# Patient Record
Sex: Female | Born: 1995 | Race: Black or African American | Hispanic: No | Marital: Single | Smoking: Former smoker
Health system: Southern US, Community
[De-identification: ages and names within clinical notes are randomized; demographics above are authoritative.]

## PROBLEM LIST (undated history)

## (undated) DIAGNOSIS — F410 Panic disorder [episodic paroxysmal anxiety] without agoraphobia: Secondary | ICD-10-CM

## (undated) DIAGNOSIS — F419 Anxiety disorder, unspecified: Secondary | ICD-10-CM

---
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
## 2020-05-19 ENCOUNTER — Inpatient Hospital Stay (HOSPITAL_COMMUNITY): Payer: BC Managed Care – PPO

## 2020-05-19 ENCOUNTER — Emergency Department (HOSPITAL_COMMUNITY): Payer: BC Managed Care – PPO

## 2020-05-19 ENCOUNTER — Emergency Department (HOSPITAL_COMMUNITY): Payer: BC Managed Care – PPO | Admitting: Certified Registered Nurse Anesthetist

## 2020-05-19 ENCOUNTER — Encounter (HOSPITAL_COMMUNITY): Payer: Self-pay | Admitting: Neurology

## 2020-05-19 ENCOUNTER — Inpatient Hospital Stay (HOSPITAL_COMMUNITY)
Admission: EM | Admit: 2020-05-19 | Discharge: 2020-06-07 | DRG: 023 | Disposition: A | Discharge status: Rehabilitation Facility | Payer: BC Managed Care – PPO | Attending: Internal Medicine | Admitting: Internal Medicine

## 2020-05-19 ENCOUNTER — Encounter (HOSPITAL_COMMUNITY)
Admission: EM | Disposition: A | Discharge status: Rehabilitation Facility | Payer: Self-pay | Source: Home / Self Care | Attending: Neurology

## 2020-05-19 DIAGNOSIS — B9620 Unspecified Escherichia coli [E. coli] as the cause of diseases classified elsewhere: Secondary | ICD-10-CM | POA: Diagnosis present

## 2020-05-19 DIAGNOSIS — I63411 Cerebral infarction due to embolism of right middle cerebral artery: Secondary | ICD-10-CM | POA: Diagnosis not present

## 2020-05-19 DIAGNOSIS — F17200 Nicotine dependence, unspecified, uncomplicated: Secondary | ICD-10-CM | POA: Diagnosis not present

## 2020-05-19 DIAGNOSIS — R0602 Shortness of breath: Secondary | ICD-10-CM

## 2020-05-19 DIAGNOSIS — U071 COVID-19: Secondary | ICD-10-CM | POA: Diagnosis not present

## 2020-05-19 DIAGNOSIS — R000 Tachycardia, unspecified: Secondary | ICD-10-CM | POA: Diagnosis not present

## 2020-05-19 DIAGNOSIS — I10 Essential (primary) hypertension: Secondary | ICD-10-CM | POA: Diagnosis present

## 2020-05-19 DIAGNOSIS — F419 Anxiety disorder, unspecified: Secondary | ICD-10-CM | POA: Diagnosis present

## 2020-05-19 DIAGNOSIS — R4701 Aphasia: Secondary | ICD-10-CM | POA: Diagnosis present

## 2020-05-19 DIAGNOSIS — F410 Panic disorder [episodic paroxysmal anxiety] without agoraphobia: Secondary | ICD-10-CM | POA: Diagnosis present

## 2020-05-19 DIAGNOSIS — D696 Thrombocytopenia, unspecified: Secondary | ICD-10-CM | POA: Diagnosis not present

## 2020-05-19 DIAGNOSIS — I6389 Other cerebral infarction: Secondary | ICD-10-CM | POA: Diagnosis not present

## 2020-05-19 DIAGNOSIS — D6859 Other primary thrombophilia: Secondary | ICD-10-CM | POA: Diagnosis present

## 2020-05-19 DIAGNOSIS — E222 Syndrome of inappropriate secretion of antidiuretic hormone: Secondary | ICD-10-CM | POA: Diagnosis present

## 2020-05-19 DIAGNOSIS — G460 Middle cerebral artery syndrome: Secondary | ICD-10-CM | POA: Diagnosis present

## 2020-05-19 DIAGNOSIS — D649 Anemia, unspecified: Secondary | ICD-10-CM | POA: Diagnosis present

## 2020-05-19 DIAGNOSIS — E0781 Sick-euthyroid syndrome: Secondary | ICD-10-CM | POA: Diagnosis present

## 2020-05-19 DIAGNOSIS — I776 Arteritis, unspecified: Secondary | ICD-10-CM | POA: Diagnosis present

## 2020-05-19 DIAGNOSIS — J9690 Respiratory failure, unspecified, unspecified whether with hypoxia or hypercapnia: Secondary | ICD-10-CM

## 2020-05-19 DIAGNOSIS — I639 Cerebral infarction, unspecified: Secondary | ICD-10-CM | POA: Diagnosis present

## 2020-05-19 DIAGNOSIS — I615 Nontraumatic intracerebral hemorrhage, intraventricular: Secondary | ICD-10-CM | POA: Diagnosis not present

## 2020-05-19 DIAGNOSIS — I63311 Cerebral infarction due to thrombosis of right middle cerebral artery: Secondary | ICD-10-CM | POA: Diagnosis not present

## 2020-05-19 DIAGNOSIS — I63512 Cerebral infarction due to unspecified occlusion or stenosis of left middle cerebral artery: Secondary | ICD-10-CM | POA: Diagnosis not present

## 2020-05-19 DIAGNOSIS — R29728 NIHSS score 28: Secondary | ICD-10-CM | POA: Diagnosis not present

## 2020-05-19 DIAGNOSIS — I609 Nontraumatic subarachnoid hemorrhage, unspecified: Secondary | ICD-10-CM | POA: Diagnosis not present

## 2020-05-19 DIAGNOSIS — Z889 Allergy status to unspecified drugs, medicaments and biological substances status: Secondary | ICD-10-CM

## 2020-05-19 DIAGNOSIS — R7989 Other specified abnormal findings of blood chemistry: Secondary | ICD-10-CM | POA: Diagnosis present

## 2020-05-19 DIAGNOSIS — E871 Hypo-osmolality and hyponatremia: Secondary | ICD-10-CM | POA: Diagnosis not present

## 2020-05-19 DIAGNOSIS — R29725 NIHSS score 25: Secondary | ICD-10-CM | POA: Diagnosis not present

## 2020-05-19 DIAGNOSIS — Z6833 Body mass index (BMI) 33.0-33.9, adult: Secondary | ICD-10-CM

## 2020-05-19 DIAGNOSIS — N390 Urinary tract infection, site not specified: Secondary | ICD-10-CM | POA: Diagnosis present

## 2020-05-19 DIAGNOSIS — I82411 Acute embolism and thrombosis of right femoral vein: Secondary | ICD-10-CM | POA: Diagnosis present

## 2020-05-19 DIAGNOSIS — G8191 Hemiplegia, unspecified affecting right dominant side: Secondary | ICD-10-CM | POA: Diagnosis present

## 2020-05-19 DIAGNOSIS — Z0184 Encounter for antibody response examination: Secondary | ICD-10-CM

## 2020-05-19 DIAGNOSIS — Z789 Other specified health status: Secondary | ICD-10-CM | POA: Diagnosis not present

## 2020-05-19 DIAGNOSIS — R29730 NIHSS score 30: Secondary | ICD-10-CM | POA: Diagnosis not present

## 2020-05-19 DIAGNOSIS — I2699 Other pulmonary embolism without acute cor pulmonale: Secondary | ICD-10-CM | POA: Diagnosis not present

## 2020-05-19 DIAGNOSIS — R509 Fever, unspecified: Secondary | ICD-10-CM | POA: Diagnosis not present

## 2020-05-19 DIAGNOSIS — R569 Unspecified convulsions: Secondary | ICD-10-CM | POA: Diagnosis not present

## 2020-05-19 DIAGNOSIS — Q211 Atrial septal defect: Secondary | ICD-10-CM

## 2020-05-19 DIAGNOSIS — D72829 Elevated white blood cell count, unspecified: Secondary | ICD-10-CM

## 2020-05-19 DIAGNOSIS — R29723 NIHSS score 23: Secondary | ICD-10-CM | POA: Diagnosis not present

## 2020-05-19 DIAGNOSIS — I824Y9 Acute embolism and thrombosis of unspecified deep veins of unspecified proximal lower extremity: Secondary | ICD-10-CM | POA: Diagnosis not present

## 2020-05-19 DIAGNOSIS — Z9282 Status post administration of tPA (rtPA) in a different facility within the last 24 hours prior to admission to current facility: Secondary | ICD-10-CM | POA: Diagnosis not present

## 2020-05-19 DIAGNOSIS — I63412 Cerebral infarction due to embolism of left middle cerebral artery: Secondary | ICD-10-CM

## 2020-05-19 DIAGNOSIS — G911 Obstructive hydrocephalus: Secondary | ICD-10-CM | POA: Diagnosis not present

## 2020-05-19 DIAGNOSIS — I63232 Cerebral infarction due to unspecified occlusion or stenosis of left carotid arteries: Secondary | ICD-10-CM | POA: Diagnosis not present

## 2020-05-19 DIAGNOSIS — E876 Hypokalemia: Secondary | ICD-10-CM | POA: Diagnosis present

## 2020-05-19 DIAGNOSIS — I6522 Occlusion and stenosis of left carotid artery: Secondary | ICD-10-CM | POA: Diagnosis not present

## 2020-05-19 DIAGNOSIS — R1310 Dysphagia, unspecified: Secondary | ICD-10-CM | POA: Diagnosis present

## 2020-05-19 DIAGNOSIS — E669 Obesity, unspecified: Secondary | ICD-10-CM | POA: Diagnosis present

## 2020-05-19 DIAGNOSIS — R768 Other specified abnormal immunological findings in serum: Secondary | ICD-10-CM | POA: Diagnosis not present

## 2020-05-19 DIAGNOSIS — D62 Acute posthemorrhagic anemia: Secondary | ICD-10-CM | POA: Diagnosis not present

## 2020-05-19 DIAGNOSIS — F17210 Nicotine dependence, cigarettes, uncomplicated: Secondary | ICD-10-CM | POA: Diagnosis present

## 2020-05-19 DIAGNOSIS — R1312 Dysphagia, oropharyngeal phase: Secondary | ICD-10-CM | POA: Diagnosis not present

## 2020-05-19 HISTORY — DX: Cerebral infarction, unspecified: I63.9

## 2020-05-19 HISTORY — PX: IR US GUIDE VASC ACCESS RIGHT: IMG2390

## 2020-05-19 HISTORY — PX: RADIOLOGY WITH ANESTHESIA: SHX6223

## 2020-05-19 HISTORY — PX: IR PERCUTANEOUS ART THROMBECTOMY/INFUSION INTRACRANIAL INC DIAG ANGIO: IMG6087

## 2020-05-19 HISTORY — PX: IR CT HEAD LTD: IMG2386

## 2020-05-19 LAB — CBC WITH DIFFERENTIAL/PLATELET
Abs Immature Granulocytes: 0.02 10*3/uL (ref 0.00–0.07)
Basophils Absolute: 0 10*3/uL (ref 0.0–0.1)
Basophils Relative: 0 %
Eosinophils Absolute: 0 10*3/uL (ref 0.0–0.5)
Eosinophils Relative: 0 %
HCT: 37.9 % (ref 36.0–46.0)
Hemoglobin: 12.2 g/dL (ref 12.0–15.0)
Immature Granulocytes: 0 %
Lymphocytes Relative: 8 %
Lymphs Abs: 0.6 10*3/uL — ABNORMAL LOW (ref 0.7–4.0)
MCH: 28.4 pg (ref 26.0–34.0)
MCHC: 32.2 g/dL (ref 30.0–36.0)
MCV: 88.3 fL (ref 80.0–100.0)
Monocytes Absolute: 0.3 10*3/uL (ref 0.1–1.0)
Monocytes Relative: 4 %
Neutro Abs: 6.5 10*3/uL (ref 1.7–7.7)
Neutrophils Relative %: 88 %
Platelets: 288 10*3/uL (ref 150–400)
RBC: 4.29 MIL/uL (ref 3.87–5.11)
RDW: 12.5 % (ref 11.5–15.5)
WBC: 7.4 10*3/uL (ref 4.0–10.5)
nRBC: 0 % (ref 0.0–0.2)

## 2020-05-19 LAB — COMPREHENSIVE METABOLIC PANEL
ALT: 13 U/L (ref 0–44)
ALT: 15 U/L (ref 0–44)
AST: 17 U/L (ref 15–41)
AST: 28 U/L (ref 15–41)
Albumin: 3.1 g/dL — ABNORMAL LOW (ref 3.5–5.0)
Albumin: 2.7 g/dL — ABNORMAL LOW (ref 3.5–5.0)
Alkaline Phosphatase: 59 U/L (ref 38–126)
Alkaline Phosphatase: 55 U/L (ref 38–126)
Anion gap: 12 (ref 5–15)
Anion gap: 9 (ref 5–15)
BUN: 6 mg/dL (ref 6–20)
BUN: 5 mg/dL — ABNORMAL LOW (ref 6–20)
CO2: 18 mmol/L — ABNORMAL LOW (ref 22–32)
CO2: 21 mmol/L — ABNORMAL LOW (ref 22–32)
Calcium: 8.7 mg/dL — ABNORMAL LOW (ref 8.9–10.3)
Calcium: 8.2 mg/dL — ABNORMAL LOW (ref 8.9–10.3)
Chloride: 108 mmol/L (ref 98–111)
Chloride: 109 mmol/L (ref 98–111)
Creatinine, Ser: 0.64 mg/dL (ref 0.44–1.00)
Creatinine, Ser: 0.66 mg/dL (ref 0.44–1.00)
GFR calc Af Amer: >60 mL/min (ref >60)
GFR calc Af Amer: >60 mL/min (ref >60)
GFR calc non Af Amer: >60 mL/min (ref >60)
GFR calc non Af Amer: >60 mL/min (ref >60)
Glucose, Bld: 103 mg/dL — ABNORMAL HIGH (ref 70–99)
Glucose, Bld: 111 mg/dL — ABNORMAL HIGH (ref 70–99)
Potassium: 4 mmol/L (ref 3.5–5.1)
Potassium: 3.8 mmol/L (ref 3.5–5.1)
Sodium: 138 mmol/L (ref 135–145)
Sodium: 139 mmol/L (ref 135–145)
Total Bilirubin: 0.4 mg/dL (ref 0.3–1.2)
Total Bilirubin: 0.3 mg/dL (ref 0.3–1.2)
Total Protein: 7.5 g/dL (ref 6.5–8.1)
Total Protein: 6.8 g/dL (ref 6.5–8.1)

## 2020-05-19 LAB — POCT I-STAT 7, (LYTES, BLD GAS, ICA,H+H)
Acid-base deficit: 3 mmol/L — ABNORMAL HIGH (ref 0.0–2.0)
Bicarbonate: 21.3 mmol/L (ref 20.0–28.0)
Calcium, Ion: 1.08 mmol/L — ABNORMAL LOW (ref 1.15–1.40)
HCT: 35 % — ABNORMAL LOW (ref 36.0–46.0)
Hemoglobin: 11.9 g/dL — ABNORMAL LOW (ref 12.0–15.0)
O2 Saturation: 100 %
Patient temperature: 96.3
Potassium: 3.5 mmol/L (ref 3.5–5.1)
Sodium: 140 mmol/L (ref 135–145)
TCO2: 22 mmol/L (ref 22–32)
pCO2 arterial: 33.8 mmHg (ref 32.0–48.0)
pH, Arterial: 7.401 (ref 7.350–7.450)
pO2, Arterial: 394 mmHg — ABNORMAL HIGH (ref 83.0–108.0)

## 2020-05-19 LAB — PROTIME-INR
INR: 0.9 (ref 0.8–1.2)
Prothrombin Time: 11.8 seconds (ref 11.4–15.2)

## 2020-05-19 LAB — I-STAT BETA HCG BLOOD, ED (MC, WL, AP ONLY): I-stat hCG, quantitative: <5 m[IU]/mL (ref <5)

## 2020-05-19 LAB — CBC
HCT: 38.5 % (ref 36.0–46.0)
Hemoglobin: 12.2 g/dL (ref 12.0–15.0)
MCH: 28.7 pg (ref 26.0–34.0)
MCHC: 31.7 g/dL (ref 30.0–36.0)
MCV: 90.6 fL (ref 80.0–100.0)
Platelets: 285 10*3/uL (ref 150–400)
RBC: 4.25 MIL/uL (ref 3.87–5.11)
RDW: 12.6 % (ref 11.5–15.5)
WBC: 4.8 10*3/uL (ref 4.0–10.5)
nRBC: 0 % (ref 0.0–0.2)

## 2020-05-19 LAB — I-STAT CHEM 8, ED
BUN: 6 mg/dL (ref 6–20)
Calcium, Ion: 1.09 mmol/L — ABNORMAL LOW (ref 1.15–1.40)
Chloride: 108 mmol/L (ref 98–111)
Creatinine, Ser: 0.5 mg/dL (ref 0.44–1.00)
Glucose, Bld: 101 mg/dL — ABNORMAL HIGH (ref 70–99)
HCT: 39 % (ref 36.0–46.0)
Hemoglobin: 13.3 g/dL (ref 12.0–15.0)
Potassium: 4.3 mmol/L (ref 3.5–5.1)
Sodium: 140 mmol/L (ref 135–145)
TCO2: 22 mmol/L (ref 22–32)

## 2020-05-19 LAB — DIFFERENTIAL
Abs Immature Granulocytes: 0 10*3/uL (ref 0.00–0.07)
Basophils Absolute: 0 10*3/uL (ref 0.0–0.1)
Basophils Relative: 0 %
Eosinophils Absolute: 0.1 10*3/uL (ref 0.0–0.5)
Eosinophils Relative: 1 %
Immature Granulocytes: 0 %
Lymphocytes Relative: 45 %
Lymphs Abs: 2.1 10*3/uL (ref 0.7–4.0)
Monocytes Absolute: 0.8 10*3/uL (ref 0.1–1.0)
Monocytes Relative: 17 %
Neutro Abs: 1.8 10*3/uL (ref 1.7–7.7)
Neutrophils Relative %: 37 %

## 2020-05-19 LAB — FIBRINOGEN: Fibrinogen: 289 mg/dL (ref 210–475)

## 2020-05-19 LAB — APTT: aPTT: 27 seconds (ref 24–36)

## 2020-05-19 LAB — CBG MONITORING, ED: Glucose-Capillary: 107 mg/dL — ABNORMAL HIGH (ref 70–99)

## 2020-05-19 LAB — FERRITIN: Ferritin: 24 ng/mL (ref 11–307)

## 2020-05-19 LAB — TYPE AND SCREEN
ABO/RH(D): O POS
Antibody Screen: NEGATIVE

## 2020-05-19 LAB — D-DIMER, QUANTITATIVE: D-Dimer, Quant: >20 ug/mL-FEU — ABNORMAL HIGH (ref 0.00–0.50)

## 2020-05-19 LAB — HEPATITIS B SURFACE ANTIGEN: Hepatitis B Surface Ag: NONREACTIVE

## 2020-05-19 LAB — PROCALCITONIN: Procalcitonin: <0.1 ng/mL

## 2020-05-19 LAB — ABO/RH: ABO/RH(D): O POS

## 2020-05-19 LAB — SAR COV2 SEROLOGY (COVID19)AB(IGG),IA: SARS-CoV-2 Ab, IgG: REACTIVE — AB

## 2020-05-19 LAB — HIV ANTIBODY (ROUTINE TESTING W REFLEX): HIV Screen 4th Generation wRfx: NONREACTIVE

## 2020-05-19 LAB — LACTIC ACID, PLASMA: Lactic Acid, Venous: 1.7 mmol/L (ref 0.5–1.9)

## 2020-05-19 LAB — LACTATE DEHYDROGENASE: LDH: 152 U/L (ref 98–192)

## 2020-05-19 LAB — C-REACTIVE PROTEIN: CRP: 0.8 mg/dL (ref <1.0)

## 2020-05-19 LAB — TRIGLYCERIDES: Triglycerides: 43 mg/dL (ref <150)

## 2020-05-19 LAB — SARS CORONAVIRUS 2 BY RT PCR (HOSPITAL ORDER, PERFORMED IN ~~LOC~~ HOSPITAL LAB): SARS Coronavirus 2: POSITIVE — AB

## 2020-05-19 SURGERY — IR WITH ANESTHESIA
Anesthesia: General

## 2020-05-19 MED ORDER — TRANEXAMIC ACID-NACL 1000-0.7 MG/100ML-% IV SOLN
1000.0000 mg | INTRAVENOUS | Status: AC
  Administered 2020-05-19: 1000 mg via INTRAVENOUS
  Filled 2020-05-19: qty 100

## 2020-05-19 MED ORDER — ROCURONIUM BROMIDE 100 MG/10ML IV SOLN
INTRAVENOUS | Status: DC | PRN
  Administered 2020-05-19: 50 mg via INTRAVENOUS
  Administered 2020-05-19: 80 mg via INTRAVENOUS
  Administered 2020-05-19: 20 mg via INTRAVENOUS

## 2020-05-19 MED ORDER — DOCUSATE SODIUM 50 MG/5ML PO LIQD: 100.0000 mg | Freq: Two times a day (BID) | ORAL | Status: DC

## 2020-05-19 MED ORDER — CLEVIDIPINE BUTYRATE 0.5 MG/ML IV EMUL
0.0000 mg/h | INTRAVENOUS | Status: DC
  Filled 2020-05-19: qty 150
  Filled 2020-05-19: qty 50

## 2020-05-19 MED ORDER — IOHEXOL 300 MG/ML  SOLN
150.0000 mL | Freq: Once | INTRAMUSCULAR | Status: AC | PRN
  Administered 2020-05-19: 50 mL via INTRA_ARTERIAL

## 2020-05-19 MED ORDER — THIAMINE HCL 100 MG/ML IJ SOLN
100.0000 mg | Freq: Every day | INTRAMUSCULAR | Status: DC
  Administered 2020-05-20 – 2020-05-21 (×2): 100 mg via INTRAVENOUS
  Filled 2020-05-19 (×2): qty 2

## 2020-05-19 MED ORDER — PROPOFOL 1000 MG/100ML IV EMUL
5.0000 ug/kg/min | INTRAVENOUS | Status: DC
  Administered 2020-05-19: 40 ug/kg/min via INTRAVENOUS
  Filled 2020-05-19: qty 200
  Filled 2020-05-19: qty 100

## 2020-05-19 MED ORDER — SODIUM CHLORIDE 0.9 % IV SOLN: Freq: Once | INTRAVENOUS | Status: DC

## 2020-05-19 MED ORDER — PROPOFOL 1000 MG/100ML IV EMUL: 0.0000 ug/kg/min | INTRAVENOUS | Status: DC

## 2020-05-19 MED ORDER — GLYCOPYRROLATE 0.2 MG/ML IJ SOLN
INTRAMUSCULAR | Status: DC | PRN
  Administered 2020-05-19: .2 mg via INTRAVENOUS

## 2020-05-19 MED ORDER — NICARDIPINE HCL IN NACL 20-0.86 MG/200ML-% IV SOLN: 0.0000 mg/h | INTRAVENOUS | Status: DC | PRN

## 2020-05-19 MED ORDER — PROPOFOL 500 MG/50ML IV EMUL
INTRAVENOUS | Status: DC | PRN
  Administered 2020-05-19: 50 ug/kg/min via INTRAVENOUS

## 2020-05-19 MED ORDER — ADULT MULTIVITAMIN W/MINERALS CH: 1.0000 | ORAL_TABLET | Freq: Every day | ORAL | Status: DC

## 2020-05-19 MED ORDER — MIDAZOLAM HCL 2 MG/2ML IJ SOLN: 2.0000 mg | INTRAMUSCULAR | Status: DC | PRN

## 2020-05-19 MED ORDER — POLYETHYLENE GLYCOL 3350 17 G PO PACK: 17.0000 g | PACK | Freq: Every day | ORAL | Status: DC

## 2020-05-19 MED ORDER — SODIUM CHLORIDE 0.9% FLUSH
3.0000 mL | Freq: Two times a day (BID) | INTRAVENOUS | Status: DC
  Administered 2020-05-19 – 2020-05-23 (×6): 3 mL via INTRAVENOUS

## 2020-05-19 MED ORDER — FENTANYL CITRATE (PF) 100 MCG/2ML IJ SOLN: 50.0000 ug | INTRAMUSCULAR | Status: DC | PRN

## 2020-05-19 MED ORDER — PHENYLEPHRINE HCL (PRESSORS) 10 MG/ML IV SOLN
INTRAVENOUS | Status: DC | PRN
  Administered 2020-05-19 (×4): 80 ug via INTRAVENOUS

## 2020-05-19 MED ORDER — DEXAMETHASONE SODIUM PHOSPHATE 10 MG/ML IJ SOLN
INTRAMUSCULAR | Status: DC | PRN
  Administered 2020-05-19: 10 mg via INTRAVENOUS

## 2020-05-19 MED ORDER — NITROGLYCERIN 1 MG/10 ML FOR IR/CATH LAB
INTRA_ARTERIAL | Status: AC
  Filled 2020-05-19: qty 10

## 2020-05-19 MED ORDER — SODIUM CHLORIDE 0.9% FLUSH: 3.0000 mL | Freq: Once | INTRAVENOUS | Status: DC

## 2020-05-19 MED ORDER — IOHEXOL 300 MG/ML  SOLN
150.0000 mL | Freq: Once | INTRAMUSCULAR | Status: AC | PRN
  Administered 2020-05-19: 75 mL via INTRA_ARTERIAL

## 2020-05-19 MED ORDER — FENTANYL CITRATE (PF) 100 MCG/2ML IJ SOLN
INTRAMUSCULAR | Status: AC
Start: 2020-05-19 — End: 2020-05-20
  Filled 2020-05-19: qty 2

## 2020-05-19 MED ORDER — ASPIRIN 325 MG PO TABS
ORAL_TABLET | ORAL | Status: AC
  Filled 2020-05-19: qty 1

## 2020-05-19 MED ORDER — ACETAMINOPHEN 160 MG/5ML PO SOLN
650.0000 mg | ORAL | Status: DC | PRN
  Administered 2020-05-23 – 2020-05-29 (×7): 650 mg
  Filled 2020-05-19 (×8): qty 20.3

## 2020-05-19 MED ORDER — FENTANYL CITRATE (PF) 100 MCG/2ML IJ SOLN
50.0000 ug | INTRAMUSCULAR | Status: DC | PRN
  Administered 2020-05-19 – 2020-05-20 (×2): 100 ug via INTRAVENOUS
  Filled 2020-05-19 (×2): qty 2

## 2020-05-19 MED ORDER — POLYETHYLENE GLYCOL 3350 17 G PO PACK
17.0000 g | PACK | Freq: Every day | ORAL | Status: DC
  Administered 2020-05-24 – 2020-05-25 (×2): 17 g
  Filled 2020-05-19 (×2): qty 1

## 2020-05-19 MED ORDER — FOLIC ACID 5 MG/ML IJ SOLN
1.0000 mg | Freq: Every day | INTRAMUSCULAR | Status: DC
  Administered 2020-05-20 – 2020-05-21 (×2): 1 mg via INTRAVENOUS
  Filled 2020-05-19 (×4): qty 0.2

## 2020-05-19 MED ORDER — FENTANYL CITRATE (PF) 100 MCG/2ML IJ SOLN
INTRAMUSCULAR | Status: DC | PRN
Start: 2020-05-19 — End: 2020-05-19
  Administered 2020-05-19 (×2): 50 ug via INTRAVENOUS

## 2020-05-19 MED ORDER — SUGAMMADEX SODIUM 200 MG/2ML IV SOLN
4.0000 mg/kg | INTRAVENOUS | Status: DC
  Filled 2020-05-19: qty 4

## 2020-05-19 MED ORDER — ADULT MULTIVITAMIN LIQUID CH
15.0000 mL | Freq: Every day | ORAL | Status: DC
  Administered 2020-05-19 – 2020-05-20 (×2): 15 mL
  Filled 2020-05-19 (×4): qty 15

## 2020-05-19 MED ORDER — NIMODIPINE 6 MG/ML PO SOLN
60.0000 mg | ORAL | Status: DC
  Administered 2020-05-19 – 2020-05-20 (×5): 60 mg
  Filled 2020-05-19 (×5): qty 10

## 2020-05-19 MED ORDER — CLOPIDOGREL BISULFATE 300 MG PO TABS
ORAL_TABLET | ORAL | Status: AC
  Filled 2020-05-19: qty 1

## 2020-05-19 MED ORDER — NIMODIPINE 30 MG PO CAPS
60.0000 mg | ORAL_CAPSULE | ORAL | Status: DC
  Filled 2020-05-19: qty 2

## 2020-05-19 MED ORDER — CLOPIDOGREL BISULFATE 75 MG PO TABS
ORAL_TABLET | ORAL | Status: DC | PRN
  Administered 2020-05-19: 300 mg

## 2020-05-19 MED ORDER — LABETALOL HCL 5 MG/ML IV SOLN: 10.0000 mg | Freq: Once | INTRAVENOUS | Status: DC | PRN

## 2020-05-19 MED ORDER — DOCUSATE SODIUM 50 MG/5ML PO LIQD
100.0000 mg | Freq: Two times a day (BID) | ORAL | Status: DC
  Administered 2020-05-19 – 2020-05-25 (×5): 100 mg
  Filled 2020-05-19 (×4): qty 10

## 2020-05-19 MED ORDER — IOHEXOL 300 MG/ML  SOLN
50.0000 mL | Freq: Once | INTRAMUSCULAR | Status: AC | PRN
  Administered 2020-05-19: 25 mL via INTRA_ARTERIAL

## 2020-05-19 MED ORDER — SODIUM CHLORIDE (PF) 0.9 % IJ SOLN
INTRAVENOUS | Status: DC | PRN
  Administered 2020-05-19: 200 ug via INTRA_ARTERIAL

## 2020-05-19 MED ORDER — ONDANSETRON HCL 4 MG/2ML IJ SOLN
INTRAMUSCULAR | Status: DC | PRN
  Administered 2020-05-19: 4 mg via INTRAVENOUS

## 2020-05-19 MED ORDER — ACETAMINOPHEN 650 MG RE SUPP
650.0000 mg | RECTAL | Status: DC | PRN
  Administered 2020-05-29: 650 mg via RECTAL
  Filled 2020-05-19: qty 1

## 2020-05-19 MED ORDER — PHENYLEPHRINE HCL-NACL 10-0.9 MG/250ML-% IV SOLN
INTRAVENOUS | Status: DC | PRN
  Administered 2020-05-19: 40 ug/min via INTRAVENOUS

## 2020-05-19 MED ORDER — SODIUM CHLORIDE 0.9 % IV SOLN
50.0000 mL/h | INTRAVENOUS | Status: DC
  Administered 2020-05-23 – 2020-05-28 (×4): 50 mL/h via INTRAVENOUS

## 2020-05-19 MED ORDER — PANTOPRAZOLE SODIUM 40 MG IV SOLR
40.0000 mg | Freq: Every day | INTRAVENOUS | Status: DC
  Administered 2020-05-19 – 2020-05-21 (×4): 40 mg via INTRAVENOUS
  Filled 2020-05-19 (×4): qty 40

## 2020-05-19 MED ORDER — ACETAMINOPHEN 325 MG PO TABS
650.0000 mg | ORAL_TABLET | ORAL | Status: DC | PRN
  Administered 2020-05-20 – 2020-06-01 (×32): 650 mg via ORAL
  Filled 2020-05-19 (×28): qty 2

## 2020-05-19 MED ORDER — IOHEXOL 350 MG/ML SOLN
75.0000 mL | Freq: Once | INTRAVENOUS | Status: AC | PRN
  Administered 2020-05-19: 12:00:00 75 mL via INTRAVENOUS

## 2020-05-19 MED ORDER — ORAL CARE MOUTH RINSE
15.0000 mL | OROMUCOSAL | Status: DC
  Administered 2020-05-20 (×6): 15 mL via OROMUCOSAL

## 2020-05-19 MED ORDER — ASPIRIN 325 MG PO TABS
ORAL_TABLET | ORAL | Status: DC | PRN
  Administered 2020-05-19: 650 mg

## 2020-05-19 MED ORDER — ALTEPLASE (STROKE) FULL DOSE INFUSION
75.8000 mg | Freq: Once | INTRAVENOUS | Status: AC
  Administered 2020-05-19: 75.8 mg via INTRAVENOUS
  Filled 2020-05-19: qty 100

## 2020-05-19 MED ORDER — SUCCINYLCHOLINE CHLORIDE 20 MG/ML IJ SOLN
INTRAMUSCULAR | Status: DC | PRN
  Administered 2020-05-19: 160 mg via INTRAVENOUS

## 2020-05-19 MED ORDER — SODIUM CHLORIDE 0.9 % IV SOLN
50.0000 mL | Freq: Once | INTRAVENOUS | Status: AC
  Administered 2020-05-19: 50 mL via INTRAVENOUS

## 2020-05-19 MED ORDER — SODIUM CHLORIDE 0.9 % IV SOLN: INTRAVENOUS | Status: DC | PRN

## 2020-05-19 MED ORDER — STROKE: EARLY STAGES OF RECOVERY BOOK
Freq: Once | Status: AC
  Filled 2020-05-19 (×2): qty 1

## 2020-05-19 MED ORDER — PROPOFOL 10 MG/ML IV BOLUS
INTRAVENOUS | Status: DC | PRN
  Administered 2020-05-19: 150 mg via INTRAVENOUS

## 2020-05-19 MED ORDER — CHLORHEXIDINE GLUCONATE 0.12% ORAL RINSE (MEDLINE KIT)
15.0000 mL | Freq: Two times a day (BID) | OROMUCOSAL | Status: DC
  Administered 2020-05-19 – 2020-05-21 (×4): 15 mL via OROMUCOSAL

## 2020-05-19 NOTE — H&P (Addendum)
NAME:  Gail Montgomery, MRN:  [id number], DOB:  [birth year], LOS: 0 ADMISSION DATE:  05/19/2020, CONSULTATION DATE:  05/19/2020 REFERRING MD:  Jobe Marker, CHIEF COMPLAINT:  Code Stroke/ Vent management   Brief History   24 year old female without significant PMH on BCP, some recreational mariajuana use. She was normal upon waking the morning of  8/21.  At 09:30 she had sudden onset of right hemiparesis, and aphasia. She presented 8/21/2021to the [facility] ED as a Code Stroke  with acute left MCA syndrome. She received tPA at 11:29 am she was deemed a  candidate for thrombectomy, and was taken to IR. PCCM  have been asked to manage vent and assist with care.  History of present illness   24 year old female normal upon waking up this morning 8/21 2021. At 09:30 she had sudden onset of right hemiparesis, and aphasia. She presents 8/21/2021to the [facility] ED as a Code Stroke  with acute left MCA syndrome.She was screened as Covid +, but was asymptomatic.  She received tPA at 11:29 am , she was deemed a  candidate for thrombectomy, and was taken to IR..In IR she had successful  opening of her LMCA with reperfusion, but LACA has a residual small distal anterior cerebral clot.  During procedure there was  ICA vasospasm vs dissection. She was loaded with ASA and Plavix. There was concern that she would need carotid  stenting . However upon re-imaging the vessel was open, and she did not require stent. There is concern for a large  subarachnoid bleed. She is currently going for CT scan of the head to further evaluate for subarachnoid hemorrhage and potential need for reversal of tPA.  PCCM have been asked to manage Vent and assist with care.   Past Medical History   Past Medical History:  Diagnosis Date  . Anxiety   . Panic attack     Significant Hospital Events   05/19/2020 Admission to [facility] with acute L MCA syndrome  Consults:  8/21 PCCM  Procedures:  8/21: Mechanical Thrombectomy   Significant Diagnostic  Tests:  05/19/2020 CTA Brain Cut off at the left ICA terminus with visible luminal clot. There is continuation of non opacification throughout the left M1 segment which is clot by CT. Non opacification at the left A1 origin. No contralateral branch occlusion is seen. Intravascular enhancement seen in the left operculum branches on the delayed phase.  Micro Data:  05/19/2020  SARS Coronavirus 2 NEGATIVE POSITIVEAbnormal   05/19/2020 >>> Blood Cultures x 2   Antimicrobials:  None   Interim history/subjective:  Pending ICU admission from IR post thrombectomy for LICA to MCA embolic stroke.  Sub arachnoid bleed post procedure Cryoprecipitate to reverse tPA Possible IVC drain insertion  Objective   Blood pressure (!) 109/93, pulse 85, resp. rate 15, weight 84.2 kg, SpO2 96 %.       No intake or output data in the 24 hours ending 05/19/20 1413 Filed Weights   05/19/20 1100  Weight: 84.2 kg    Examination: General: sedated and intubated female, on MV HENT: NCAT, ETT secure and intact, OG tube secure and intact, MM pink and moist Lungs: Bilateral chest excursion, Clear throughout Cardiovascular: S1, S2, RRR No RMG Abdomen: Soft, NT, ND, BS +Body mass index is 31.86 kg/m. 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  tPA Dose 11:29 am Load 7.6 mg over 1 minute 68.2 mg for a total dose of 75.8 mg over 1 hour Plavix and ASA load Plan Monitor for bleeding Trend CBC Transfuse for HGB < 7  Rest Per Neuro   Subarachnoid Hemorrhage Plan Cryoprecipitate and TXA in process Monitor for seizures Consider Anti seizure medication as prophylaxis Trend mag and replete for goal of > 2 Consideration of IVC drain placement  Consider consult to neuro  surgery  Blood Pressure Control post thrombectomy Plan Goal BP 140-160 systolic Cleviprex gtt to maintain BP within range Labetalol prn  Mechanical Ventilation Post LICA to MCA thrombectomy Plan CXR now stat for ETT placement and Covid trending Titrate oxygen for sats > 94% ABG prn CXR prn No SBT x 24 hours Sedation with propofol as ordered Monitor triglycerides    Covid 19 Positive Incidental finding on admission BMI 31.86 ( 62 inches, 84.2 KG) Plan Consider Monoclonal antibody infusion Trend inflammatory markers  Initiate Covid Focused order set Airborne and Contact isolation  Bevelyn Ngo, MSN, AGACNP-BC [place] [facility] See Amion for personal pager PCCM on call pager [phone]    Best practice:  Diet: NPO Pain/Anxiety/Delirium protocol (if indicated): Propofol VAP protocol (if indicated): Ordered DVT prophylaxis: None for now, received TPA plavix and ASA GI prophylaxis: Protonix Glucose control: CBG and SSI Mobility: BR Code Status: Full Family Communication: Updated by Dr. Loreta Ave Disposition: ICU  Labs   CBC: Recent Labs  Lab 05/19/20 1120 05/19/20 1122  WBC 4.8  --   NEUTROABS 1.8  --   HGB 12.2 13.3  HCT 38.5 39.0  MCV 90.6  --   PLT 285  --     Basic Metabolic Panel: Recent Labs  Lab 05/19/20 1120 05/19/20 1122  NA 138 140  K 4.0 4.3  CL 108 108  CO2 18*  --   GLUCOSE 103* 101*  BUN 6 6  CREATININE 0.64 0.50  CALCIUM 8.7*  --    GFR: CrCl cannot be calculated (Unknown ideal weight.). Recent Labs  Lab 05/19/20 1120  WBC 4.8    Liver Function Tests: Recent Labs  Lab 05/19/20 1120  AST 17  ALT 13  ALKPHOS 59  BILITOT 0.4  PROT 7.5  ALBUMIN 3.1*   No results for input(s): LIPASE, AMYLASE in the last 168 hours. No results for input(s): AMMONIA in the last 168 hours.  ABG    Component Value Date/Time   TCO2 22 05/19/2020 1122     Coagulation Profile: Recent Labs  Lab  05/19/20 1120  INR 0.9    Cardiac Enzymes: No results for input(s): CKTOTAL, CKMB, CKMBINDEX, TROPONINI in the last 168 hours.  HbA1C: No results found for: HGBA1C  CBG: Recent Labs  Lab 05/19/20 1116  GLUCAP 107*    Review of Systems:   Unable as patient   Past Medical History  She,  has a past medical history of Anxiety and Panic attack.   Surgical History   No past surgical history on file.   Social History   reports that she has been smoking cigarettes. She has never used smokeless tobacco. She reports current alcohol use. She reports that she does not use drugs.   Family History   Her family history is not on file.   Allergies No Known Allergies   Home Medications  Prior to Admission medications   Not on File  Critical care time: 27 minutes    Bevelyn Ngo, MSN, AGACNP-BC [facility] Pulmonary/Critical Care Medicine See Amion for personal pager PCCM on call pager [phone] 05/19/2020 5:01 PM

## 2020-05-19 NOTE — Progress Notes (Signed)
Cryo finished. Propofol turned off to facilitate neuro exam.

## 2020-05-19 NOTE — Progress Notes (Signed)
Pharmacist Code Stroke Response  Notified to mix tPA at 1129 by Dr. Amada Jupiter tPA was reconstituted at 1132 but unknown weight. After weight was obtained, drug was ready to administer at 1138.  tPA dose = 7.6mg  bolus over 1 minute followed by 68.2mg  for a total dose of 75.8mg  over 1 hour  Issues/delays encountered (if applicable): Unable to get initial weight due to lack of a scale bed.   Destynee Stringfellow, Drake Leach 05/19/20 11:42 AM

## 2020-05-19 NOTE — Sedation Documentation (Addendum)
Pt COVID positive. AC working on bed assignment.

## 2020-05-19 NOTE — Anesthesia Preprocedure Evaluation (Addendum)
Anesthesia Evaluation  Patient identified by MRN, date of birth, ID band Patient awake    Reviewed: Allergy & Precautions, H&P , NPO status , Patient's Chart, lab work & pertinent test results  Airway Mallampati: III  TM Distance: >3 FB Neck ROM: Full    Dental no notable dental hx. (+) Teeth Intact, Dental Advisory Given   Pulmonary neg pulmonary ROS,    Pulmonary exam normal breath sounds clear to auscultation       Cardiovascular negative cardio ROS   Rhythm:Regular Rate:Normal     Neuro/Psych Anxiety Aphasia R side weakness CVA, Residual Symptoms    GI/Hepatic negative GI ROS, Neg liver ROS,   Endo/Other  negative endocrine ROS  Renal/GU negative Renal ROS  negative genitourinary   Musculoskeletal   Abdominal   Peds  Hematology negative hematology ROS (+)   Anesthesia Other Findings   Reproductive/Obstetrics negative OB ROS                            Anesthesia Physical Anesthesia Plan  ASA: III and emergent  Anesthesia Plan: General   Post-op Pain Management:    Induction: Intravenous, Rapid sequence and Cricoid pressure planned  PONV Risk Score and Plan: 4 or greater and Ondansetron, Dexamethasone and Treatment may vary due to age or medical condition  Airway Management Planned: Oral ETT  Additional Equipment:   Intra-op Plan:   Post-operative Plan: Extubation in OR and Possible Post-op intubation/ventilation  Informed Consent: I have reviewed the patients History and Physical, chart, labs and discussed the procedure including the risks, benefits and alternatives for the proposed anesthesia with the patient or authorized representative who has indicated his/her understanding and acceptance.     Dental advisory given  Plan Discussed with: CRNA  Anesthesia Plan Comments:         Anesthesia Quick Evaluation

## 2020-05-19 NOTE — Transfer of Care (Signed)
Immediate Anesthesia Transfer of Care Note  Patient: Gail Montgomery  Procedure(s) Performed: IR WITH ANESTHESIA (N/A )  Patient Location: ICU  Anesthesia Type:General  Level of Consciousness: sedated, unresponsive and Patient remains intubated per anesthesia plan  Airway & Oxygen Therapy: Patient remains intubated per anesthesia plan and Patient placed on Ventilator (see vital sign flow sheet for setting)  Post-op Assessment: Report given to RN and Post -op Vital signs reviewed and stable  Post vital signs: Reviewed and stable  Last Vitals:  Vitals Value Taken Time  BP 130/75 05/19/20 1615  Temp    Pulse 73 05/19/20 1623  Resp 15 05/19/20 1623  SpO2 100 % 05/19/20 1623  Vitals shown include unvalidated device data.  Last Pain: There were no vitals filed for this visit.       Complications: No complications documented.

## 2020-05-19 NOTE — Sedation Documentation (Signed)
Spoke with Ashok Cordia, RN, 4N Change. Pt to be admitted to 4N18. Requested monitor and O2 tank be placed on bed. Transport contact to bring bed to IR2.

## 2020-05-19 NOTE — Sedation Documentation (Signed)
Spoke with Margo in pt placement. Pt to be admitted to 4N. Room being cleaned.

## 2020-05-19 NOTE — H&P (Signed)
Neurology H&P  CC: Right-sided weakness and aphasia  History is obtained from: Patient's boyfriend, mother  HPI: Gail Montgomery is a 24 y.o. female with no significant past medical history other than hormonal birth control who presents with right-sided weakness and aphasia that started abruptly at 9:30 AM.  She was in her normal state of health and sitting on the edge of the bed when she suddenly developed right-sided weakness.  Her boyfriend called 911 and she was activated as a code stroke in route.  She was taken emergently for a CT which showed a dense left MCA sign and therefore IR was activated.  A CTA confirmed distal left ICA occlusion extending in the MCA and ACA.  She was taken for emergent thrombectomy and the MCA was opened relatively easily, grew ACA clot migrated distally, was more difficult to reach.  Intraoperatively, there was noticed that she had severe stenosis of her carotid artery, initially felt to represent possible dissection.  It was felt that stenting was going to be necessary and therefore she was loaded with aspirin and Plavix.  On the run prior to applying the stent, the vessel was found to be widely patent and therefore no stent was applied.  Postoperatively, she was taken for head CT which demonstrated a large amount of subarachnoid hemorrhage with extension into the ventricles including lateral ventricle and fourth ventricle.   LKW: 9:30 AM tpa given?:  Yes IR Thrombectomy?  Yes Modified Rankin Scale: 0-Completely asymptomatic and back to baseline post- stroke    ROS: Unable to obtain due to altered mental status. Past Medical History:  Diagnosis Date  . Anxiety   . Panic attack     Family history: Unable to obtain due to altered mental status.    Social History: Per her boyfriend, she uses marijuana occasionally, no synthetic marijuana, no other drugs.  Prior to Admission medications     birth control   Exam: Current vital signs: BP (!) 109/93    Pulse 85   Resp 15   Wt 84.2 kg   SpO2 96%   BMI 33.95 kg/m    Physical Exam  Constitutional: Appears well-developed and well-nourished.  Psych: Affect appropriate to situation Eyes: No scleral injection HENT: No OP obstrucion Head: Normocephalic.  Cardiovascular: Normal rate and regular rhythm.  Respiratory: Effort normal and breath sounds normal to anterior ascultation GI: Soft.  No distension. There is no tenderness.  Skin: WDI  Neuro: Mental Status: Patient is awake, alert, globally aphasic Cranial Nerves: II: Does not blink to threat from the right pupils are equal, round, and reactive to light.   III,IV, VI: EOMI without ptosis or diploplia.  VII: Facial movement is diminished on the right Motor: She has severe right hemiparesis with little movement of the right arm or leg, though I do see her movement briefly one time.  She is able to move her left side voluntarily well. sensory: Does not respond as much on the right as the left Cerebellar: She does not perform   I have reviewed labs in epic and the pertinent results are: Covid positive CMP-unremarkable  I have reviewed the images obtained: CT/CTA-left distal ICA/MCA occlusion  Primary Diagnosis:  Cerebral infarction due to embolism of  left middle cerebral artery.   Secondary Diagnosis: Covid   Impression: 24 year old female with ICA/MCA occlusion in the setting of Covid despite having had her Pfizer vaccine with most recent dose 1 month ago.  Other etiologies will need to be evaluated as well including  cardiac emboli.  Her postoperative hemorrhage represents a distinct dilemma.  Between the time she was scanned intraoperatively in the time she was scanned on the plain CT, there is distinct progression of the hemorrhage.  In that setting, I opted to give TXA and cryoprecipitate to try and stops the bleeding.  This does put her at risk of other clots, but not treating runs a risk of severe continued hemorrhage  as well.  Plan: Stroke - HgbA1c, fasting lipid panel - Frequent neuro checks - Echocardiogram - Prophylactic therapy-None, will place NG tube to suction given recent load - Risk factor modification - Telemetry monitoring  SAH - TXA and cryo given to counteract tPA.  - Gastric lavage to remove as much antiplatelet if any remains in stomach - NSGY consult  - repeat head CT at 9pm.   Positive Covid test - no clear symptoms prior to stroke - appreciate CCM assistance   This patient is critically ill and at significant risk of neurological worsening, death and care requires constant monitoring of vital signs, hemodynamics,respiratory and cardiac monitoring, neurological assessment, discussion with family, other specialists and medical decision making of high complexity. I spent 95 minutes of neurocritical care time  in the care of  this patient. This was time spent independent of any time provided by nurse practitioner or PA.  Ritta Slot, MD [facility] [phone]  If 7pm- 7am, please page neurology on call as listed in AMION.

## 2020-05-19 NOTE — Progress Notes (Signed)
Patient ID: Gail Montgomery, female   DOB: 16-Dec-1995, 24 y.o.   MRN: [id number] BP 134/86   Pulse 77   Resp 15   Ht 5\' 4"  (1.626 m)   Wt 84.2 kg   SpO2 100%   BMI 31.86 kg/m   Not following commands, or at least not consistently following commands Eyes open, conjugate gaze. Reactive to light Vigorous cough Symmetric facial movements No movement on right side to noxious stimuli Purposeful movement left upper and lower extremities. Pulling at the ET tube with left hand Given antiplatelet treatment earlier, ventricular catheter is ill advised at this time.  Available for questions as they arise.

## 2020-05-19 NOTE — Procedures (Signed)
Neuro-Interventional Radiology  Post Cerebral Angiogram Procedure Note  Operator:    Dr. Loreta Ave Assistant:   None  History:   24 yo female presents with acute left MCA syndrome, candidate for thrombectomy.   Baseline mRS:  0 NIHSS:   23 Site of occlusion:  Left MCA & left ACA   Procedure: US guided right CFA access Cervical & Cerebral Angiogram Mechanical Thrombectomy Deployment of Angioseal Flat Panel CT in NIR  Left MCA:  Baseline TICI 0 First Pass Device:  Local aspiration and solitaire 4 x 40  Result   TICI 0  Second Pass Device: Local aspiration and solitaire 4 x 40  Result   TICI 3  Left ACA/pericallosal Baseline TICI 0 Final TICI 0 pericallosal artery  Findings:   Patent right CFA Left M1 occlusion, final is TICI 3 Left ACA A2 occlusion migrated to pericallosal artery during the case.  Could not complete a first pass safely.   Significant irregularity of the cervical ICA, concerning for dissection, that resolved after observation, and once DAPT was initiated.  The final appearance may represent spasm vs FMD/vasculitis  Anesthesia:   GETA  EBL:    150cc     Complication:  None   Medication: IV tPA administered?: yes IA Medication:  nitro OG:     300mg  plavix, 650mg  ASA  Recommendations: - right hip straight overnight - Goal SBP 140-160.   - Frequent NV checks - CT now, discretion of Neurology - NIR to follow  Signed,  . , DO

## 2020-05-19 NOTE — ED Provider Notes (Signed)
[facility]GICAL [facility] Provider Note   CSN: [id number] Arrival date & time: 05/19/20  1112     History No chief complaint on file.   Gail Montgomery is a 24 y.o. female.  Patient is a 24 year old female who who is brought in as a code stroke.  At around 9 AM this morning she developed sudden aphasia with right-sided weakness.  EMS activated a code stroke prior to arrival.  I saw her briefly at the bridge.  She had an intact airway.  She was not able to communicate.  History is limited due to this.        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   OB History   No obstetric history on file.     No family history on file.  Social History   Tobacco Use   Smoking status: Current Some Day Smoker    Types: Cigarettes   Smokeless tobacco: Never Used  Substance Use Topics   Alcohol use: Yes    Comment: OCCASIONAL    Drug use: No    Home Medications Prior to Admission medications   Not on File    Allergies    Patient has no known allergies.  Review of Systems   Review of Systems  Unable to perform ROS: Mental status change    Physical Exam Updated Vital Signs BP (!) 109/93    Pulse 85    Resp 15    Wt 84.2 kg    SpO2 96%    BMI 33.95 kg/m   Physical Exam HENT:     Head: Normocephalic and atraumatic.     Nose: Nose normal.     Mouth/Throat:     Mouth: Mucous membranes are moist.  Eyes:     Extraocular Movements: Extraocular movements intact.     Conjunctiva/sclera: Conjunctivae normal.  Cardiovascular:     Rate and Rhythm: Normal rate.  Pulmonary:     Effort: Pulmonary effort is normal.  Musculoskeletal:        General: Normal range of motion.  Skin:    General: Skin is warm and dry.  Neurological:     Mental Status: She is alert.     Comments: Patient with aphasia and  right-sided weakness     ED Results / Procedures / Treatments   Labs (all labs ordered are listed, but only abnormal results are displayed) Labs Reviewed  SARS CORONAVIRUS 2 BY RT PCR (HOSPITAL ORDER, PERFORMED IN [facility]) - Abnormal; Notable for the following components:      Result Value   SARS Coronavirus 2 POSITIVE (*)    All other components within normal limits  COMPREHENSIVE METABOLIC PANEL - Abnormal; Notable for the following components:   CO2 18 (*)    Glucose, Bld 103 (*)    Calcium 8.7 (*)    Albumin 3.1 (*)    All other components within normal limits  I-STAT CHEM 8, ED - Abnormal; Notable for the following components:   Glucose, Bld 101 (*)    Calcium, Ion 1.09 (*)    All other components within normal limits  CBG MONITORING, ED - Abnormal; Notable for the following components:   Glucose-Capillary 107 (*)    All other components within normal limits  CULTURE, BLOOD (ROUTINE X 2)  CULTURE, BLOOD (ROUTINE X 2)  PROTIME-INR  APTT  CBC  DIFFERENTIAL  LACTIC ACID, PLASMA  LACTIC ACID, PLASMA  CBC WITH DIFFERENTIAL/PLATELET  COMPREHENSIVE METABOLIC PANEL  D-DIMER, QUANTITATIVE (NOT AT [facility])  PROCALCITONIN  LACTATE DEHYDROGENASE  FERRITIN  TRIGLYCERIDES  FIBRINOGEN  C-REACTIVE PROTEIN  HIV ANTIBODY (ROUTINE TESTING W REFLEX)  FIBRINOGEN  I-STAT BETA HCG BLOOD, ED ([facility], [facility], [facility] ONLY)  I-STAT BETA HCG BLOOD, ED ([facility], [facility], [facility] ONLY)  TYPE AND SCREEN  ABO/RH  PREPARE CRYOPRECIPITATE    EKG None  Radiology CT Code Stroke CTA Head W/WO contrast  Result Date: 05/19/2020 CLINICAL DATA:  Stroke suspected. EXAM: CT ANGIOGRAPHY HEAD AND NECK TECHNIQUE: Multidetector CT imaging of the head and neck was performed using the standard protocol during bolus administration of intravenous contrast. Multiplanar CT image reconstructions and MIPs were obtained to evaluate the vascular anatomy. Carotid stenosis measurements (when applicable) are obtained utilizing  NASCET criteria, using the distal internal carotid diameter as the denominator. CONTRAST:  Dose is currently not known COMPARISON:  Head CT reported separately FINDINGS: CTA NECK FINDINGS Aortic arch: Negative.  Three vessel branching Right carotid system: No atheromatous changes, dissection, or beading. Left carotid system: Mild motion artifact which could account for the mildly smudged appearance of the left ICA proximally. Vertebral arteries: No proximal subclavian stenosis. Codominant vertebral arteries that are smooth and widely patent to the dura. Skeleton: No acute or aggressive finding Other neck: Negative Upper chest: Negative Review of the MIP images confirms the above findings CTA HEAD FINDINGS Anterior circulation: Cut off at the left ICA terminus with visible luminal clot. There is continuation of non opacification throughout the left M1 segment which is clot by CT. Non opacification at the left A1 origin. No contralateral branch occlusion is seen. Intravascular enhancement seen in the left operculum branches on the delayed phase. Posterior circulation: The vertebral and basilar arteries are smooth and widely patent. No branch occlusion, beading, or aneurysm. Venous sinuses: Patent Anatomic variants: None significant Review of the MIP images confirms the above findings These results were called by telephone at the time of interpretation on 05/19/2020 at 11:35 am to provider [facility] , who is already aware. IMPRESSION: 1. 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 2. Mildly indistinct appearance of the proximal left ICA is likely from motion artifact, consider cervical run. No atheromatous changes or vasculopathy seen in the neck. Electronically Signed   By: Marnee Spring M.D.   On: 05/19/2020 11:40   CT HEAD WO CONTRAST  Result Date: 05/19/2020 CLINICAL DATA:  Patient status post intervention for left internal carotid and MCA occlusion. EXAM: CT  HEAD WITHOUT CONTRAST TECHNIQUE: Contiguous axial images were obtained from the base of the skull through the vertex without intravenous contrast. COMPARISON:  Head CT earlier today. FINDINGS: Brain: The patient has a new large volume of subarachnoid and intraventricular hemorrhage centered anteriorly at the circle-of-Willis. Hemorrhage extends into the fourth ventricle. There is no midline shift or hydrocephalus. Gray-white differentiation appears preserved. No mass is identified. Imaged intracranial contents appear normal. Vascular: Large vessel structures are largely opacified with contrast. Skull: Intact. Sinuses/Orbits: Negative. Other: None. IMPRESSION: New large volume of subarachnoid and intraventricular hemorrhage. Critical Value/emergent results were called by telephone at the time of interpretation on 05/19/2020 at 3:38 pm to provider Dr. Amada Jupiter, who verbally acknowledged these results. Electronically Signed   By: Drusilla Kanner M.D.   On: 05/19/2020 15:42   CT Code Stroke  CTA Neck W/WO contrast  Result Date: 05/19/2020 CLINICAL DATA:  Stroke suspected. EXAM: CT ANGIOGRAPHY HEAD AND NECK TECHNIQUE: Multidetector CT imaging of the head and neck was performed using the standard protocol during bolus administration of intravenous contrast. Multiplanar CT image reconstructions and MIPs were obtained to evaluate the vascular anatomy. Carotid stenosis measurements (when applicable) are obtained utilizing NASCET criteria, using the distal internal carotid diameter as the denominator. CONTRAST:  Dose is currently not known COMPARISON:  Head CT reported separately FINDINGS: CTA NECK FINDINGS Aortic arch: Negative.  Three vessel branching Right carotid system: No atheromatous changes, dissection, or beading. Left carotid system: Mild motion artifact which could account for the mildly smudged appearance of the left ICA proximally. Vertebral arteries: No proximal subclavian stenosis. Codominant vertebral  arteries that are smooth and widely patent to the dura. Skeleton: No acute or aggressive finding Other neck: Negative Upper chest: Negative Review of the MIP images confirms the above findings CTA HEAD FINDINGS Anterior circulation: Cut off at the left ICA terminus with visible luminal clot. There is continuation of non opacification throughout the left M1 segment which is clot by CT. Non opacification at the left A1 origin. No contralateral branch occlusion is seen. Intravascular enhancement seen in the left operculum branches on the delayed phase. Posterior circulation: The vertebral and basilar arteries are smooth and widely patent. No branch occlusion, beading, or aneurysm. Venous sinuses: Patent Anatomic variants: None significant Review of the MIP images confirms the above findings These results were called by telephone at the time of interpretation on 05/19/2020 at 11:35 am to provider [facility] , who is already aware. IMPRESSION: 1. 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 2. Mildly indistinct appearance of the proximal left ICA is likely from motion artifact, consider cervical run. No atheromatous changes or vasculopathy seen in the neck. Electronically Signed   By: Marnee Spring M.D.   On: 05/19/2020 11:40   CT HEAD CODE STROKE WO CONTRAST  Result Date: 05/19/2020 CLINICAL DATA:  Code stroke.  Stroke suspected. EXAM: CT HEAD WITHOUT CONTRAST TECHNIQUE: Contiguous axial images were obtained from the base of the skull through the vertex without intravenous contrast. COMPARISON:  None. FINDINGS: Brain: No evidence of acute infarction, hemorrhage, hydrocephalus, extra-axial collection or mass lesion/mass effect. Vascular: Dense left ICA terminus and MCA, extensive in compatible with thrombosis in this setting and assuming referable symptoms. CTA is underway. Skull: Negative Sinuses/Orbits: Negative Other: These results were communicated to Dr. Amada Jupiter  at 11:25 amon 8/21/2021by text page via the [facility] messaging system. ASPECTS [facility] Stroke Program Early CT Score) - Ganglionic level infarction (caudate, lentiform nuclei, internal capsule, insula, M1-M3 cortex): 7 - Supraganglionic infarction (M4-M6 cortex): 3 Total score (0-10 with 10 being normal): 10 IMPRESSION: Dense left ICA terminus and MCA. No acute hemorrhage. ASPECTS is 10. Electronically Signed   By: Marnee Spring M.D.   On: 05/19/2020 11:27    Procedures Procedures (including critical care time)  Medications Ordered in ED Medications  sodium chloride flush (NS) 0.9 % injection 3 mL (has no administration in time range)   stroke: mapping our early stages of recovery book (has no administration in time range)  0.9 %  sodium chloride infusion (has no administration in time range)  acetaminophen (TYLENOL) tablet 650 mg (has no administration in time range)    Or  acetaminophen (TYLENOL) 160 MG/5ML solution 650 mg (has no administration in time range)    Or  acetaminophen (TYLENOL)  suppository 650 mg (has no administration in time range)  labetalol (NORMODYNE) injection 10 mg (has no administration in time range)    And  nicardipine (CARDENE) 20mg  in 0.86% saline IV infusion (0.1 mg/ml) (has no administration in time range)  pantoprazole (PROTONIX) injection 40 mg (has no administration in time range)  nitroGLYCERIN 100 mcg/mL intra-arterial injection (has no administration in time range)  nitroGLYCERIN 200 mcg in sodium chloride (PF) 0.9 % 59.74 mL (25 mcg/mL) syringe (200 mcg Intra-arterial Given 05/19/20 1328)  aspirin 325 MG tablet (has no administration in time range)  clopidogrel (PLAVIX) 300 MG tablet (has no administration in time range)  aspirin 325 MG tablet (has no administration in time range)  aspirin tablet (650 mg Per Tube Given 05/19/20 1407)  clopidogrel (PLAVIX) tablet (300 mg Per Tube Given 05/19/20 1407)  fentaNYL (SUBLIMAZE) 100 MCG/2ML injection (has no  administration in time range)  0.9 %  sodium chloride infusion (has no administration in time range)  iohexol (OMNIPAQUE) 350 MG/ML injection 75 mL (75 mLs Intravenous Contrast Given 05/19/20 1131)  alteplase (ACTIVASE) 1 mg/mL infusion 75.8 mg (0 mg Intravenous Stopped 05/19/20 1240)    Followed by  0.9 %  sodium chloride infusion (50 mLs Intravenous New Bag/Given 05/19/20 1240)  tranexamic acid (CYKLOKAPRON) IVPB 1,000 mg (1,000 mg Intravenous Given 05/19/20 1530)  iohexol (OMNIPAQUE) 300 MG/ML solution 50 mL (25 mLs Intra-arterial Contrast Given 05/19/20 1559)  iohexol (OMNIPAQUE) 300 MG/ML solution 150 mL (50 mLs Intra-arterial Contrast Given 05/19/20 1559)  iohexol (OMNIPAQUE) 300 MG/ML solution 150 mL (75 mLs Intra-arterial Contrast Given 05/19/20 1558)    ED Course  I have reviewed the triage vital signs and the nursing notes.  Pertinent labs & imaging results that were available during my care of the patient were reviewed by me and considered in my medical decision making (see chart for details).    MDM Rules/Calculators/A&P                          Patient was briefly evaluated by me on arrival.  Neurology is at bedside.  She was emergently taken to the CT scanner and was found to have a large vessel occlusion.  She was taken to interventional radiology and will be admitted by the stroke team.  CRITICAL CARE Performed by: 05/21/20 Total critical care time: 10 minutes Critical care time was exclusive of separately billable procedures and treating other patients. Critical care was necessary to treat or prevent imminent or life-threatening deterioration. Critical care was time spent personally by me on the following activities: development of treatment plan with patient and/or surrogate as well as nursing, discussions with consultants, evaluation of patient's response to treatment, examination of patient, obtaining history from patient or surrogate, ordering and performing treatments  and interventions, ordering and review of laboratory studies, ordering and review of radiographic studies, pulse oximetry and re-evaluation of patient's condition.  Final Clinical Impression(s) / ED Diagnoses Final diagnoses:  Stroke [facility])    Rx / DC Orders ED Discharge Orders    None       [facility], MD 05/19/20 1624

## 2020-05-19 NOTE — Anesthesia Postprocedure Evaluation (Signed)
Anesthesia Post Note  Patient: Gail Montgomery  Procedure(s) Performed: IR WITH ANESTHESIA (N/A )     Patient location during evaluation: SICU Anesthesia Type: General Level of consciousness: sedated Pain management: pain level controlled Vital Signs Assessment: post-procedure vital signs reviewed and stable Respiratory status: patient remains intubated per anesthesia plan Cardiovascular status: stable Postop Assessment: no apparent nausea or vomiting Anesthetic complications: no   No complications documented.  Last Vitals:  Vitals:   05/19/20 1505 05/19/20 1630  BP:  134/86  Pulse:  77  Resp:  15  SpO2: 100% 100%    Last Pain: There were no vitals filed for this visit.               Charlye Spare,W. EDMOND

## 2020-05-19 NOTE — Progress Notes (Signed)
eLink Physician-Brief Progress Note Patient Name: Gail Montgomery DOB: [birth year] MRN: [id number]   Date of Service  05/19/2020  HPI/Events of Note  Agitation  eICU Interventions  Plan: 1. Increase ceiling on Propofol IV infusion to 80 mcg/kg/min.     Intervention Category Major Interventions: Delirium, psychosis, severe agitation - evaluation and management  Enola Siebers Eugene 05/19/2020, 10:01 PM

## 2020-05-19 NOTE — Progress Notes (Addendum)
Pt arrived 1615 from IR. TXA has been given. 2 pools Emergency Release Cryo checked and started. Pool 1 [phone] and pool 2 unit # [id number].

## 2020-05-19 NOTE — Anesthesia Procedure Notes (Signed)
Procedure Name: Intubation Date/Time: 05/19/2020 11:55 AM Performed by: Chauntelle Azpeitia T, CRNA Pre-anesthesia Checklist: Patient identified, Emergency Drugs available, Suction available and Patient being monitored Patient Re-evaluated:Patient Re-evaluated prior to induction Oxygen Delivery Method: Circle system utilized Preoxygenation: Pre-oxygenation with 100% oxygen Induction Type: IV induction, Rapid sequence and Cricoid Pressure applied Laryngoscope Size: Glidescope and 4 Grade View: Grade I Tube type: Oral Tube size: 7.5 mm Number of attempts: 1 Airway Equipment and Method: Stylet,  Oral airway and Video-laryngoscopy Placement Confirmation: ETT inserted through vocal cords under direct vision,  positive ETCO2 and breath sounds checked- equal and bilateral Secured at: 22 cm Tube secured with: Tape Dental Injury: Teeth and Oropharynx as per pre-operative assessment

## 2020-05-20 ENCOUNTER — Inpatient Hospital Stay (HOSPITAL_COMMUNITY): Payer: BC Managed Care – PPO

## 2020-05-20 DIAGNOSIS — I6522 Occlusion and stenosis of left carotid artery: Secondary | ICD-10-CM

## 2020-05-20 DIAGNOSIS — F17200 Nicotine dependence, unspecified, uncomplicated: Secondary | ICD-10-CM

## 2020-05-20 DIAGNOSIS — I615 Nontraumatic intracerebral hemorrhage, intraventricular: Secondary | ICD-10-CM

## 2020-05-20 DIAGNOSIS — I609 Nontraumatic subarachnoid hemorrhage, unspecified: Secondary | ICD-10-CM

## 2020-05-20 DIAGNOSIS — Z789 Other specified health status: Secondary | ICD-10-CM

## 2020-05-20 DIAGNOSIS — I6389 Other cerebral infarction: Secondary | ICD-10-CM

## 2020-05-20 DIAGNOSIS — G911 Obstructive hydrocephalus: Secondary | ICD-10-CM

## 2020-05-20 LAB — LIPID PANEL
Cholesterol: 156 mg/dL (ref 0–200)
HDL: 57 mg/dL (ref >40)
LDL Cholesterol: 78 mg/dL (ref 0–99)
Total CHOL/HDL Ratio: 2.7 RATIO
Triglycerides: 104 mg/dL (ref <150)
VLDL: 21 mg/dL (ref 0–40)

## 2020-05-20 LAB — CBC WITH DIFFERENTIAL/PLATELET
Abs Immature Granulocytes: 0.01 10*3/uL (ref 0.00–0.07)
Basophils Absolute: 0 10*3/uL (ref 0.0–0.1)
Basophils Relative: 0 %
Eosinophils Absolute: 0 10*3/uL (ref 0.0–0.5)
Eosinophils Relative: 0 %
HCT: 36.3 % (ref 36.0–46.0)
Hemoglobin: 11.6 g/dL — ABNORMAL LOW (ref 12.0–15.0)
Immature Granulocytes: 0 %
Lymphocytes Relative: 15 %
Lymphs Abs: 1.1 10*3/uL (ref 0.7–4.0)
MCH: 28.7 pg (ref 26.0–34.0)
MCHC: 32 g/dL (ref 30.0–36.0)
MCV: 89.9 fL (ref 80.0–100.0)
Monocytes Absolute: 0.8 10*3/uL (ref 0.1–1.0)
Monocytes Relative: 11 %
Neutro Abs: 5.7 10*3/uL (ref 1.7–7.7)
Neutrophils Relative %: 74 %
Platelets: 273 10*3/uL (ref 150–400)
RBC: 4.04 MIL/uL (ref 3.87–5.11)
RDW: 12.7 % (ref 11.5–15.5)
WBC: 7.6 10*3/uL (ref 4.0–10.5)
nRBC: 0 % (ref 0.0–0.2)

## 2020-05-20 LAB — RAPID URINE DRUG SCREEN, HOSP PERFORMED
Amphetamines: NOT DETECTED
Barbiturates: NOT DETECTED
Benzodiazepines: NOT DETECTED
Cocaine: NOT DETECTED
Opiates: NOT DETECTED
Tetrahydrocannabinol: POSITIVE — AB

## 2020-05-20 LAB — PREPARE CRYOPRECIPITATE
Unit division: 0
Unit division: 0

## 2020-05-20 LAB — COMPREHENSIVE METABOLIC PANEL
ALT: 14 U/L (ref 0–44)
AST: 25 U/L (ref 15–41)
Albumin: 2.8 g/dL — ABNORMAL LOW (ref 3.5–5.0)
Alkaline Phosphatase: 54 U/L (ref 38–126)
Anion gap: 10 (ref 5–15)
BUN: 6 mg/dL (ref 6–20)
CO2: 22 mmol/L (ref 22–32)
Calcium: 8.4 mg/dL — ABNORMAL LOW (ref 8.9–10.3)
Chloride: 117 mmol/L — ABNORMAL HIGH (ref 98–111)
Creatinine, Ser: 0.74 mg/dL (ref 0.44–1.00)
GFR calc Af Amer: >60 mL/min (ref >60)
GFR calc non Af Amer: >60 mL/min (ref >60)
Glucose, Bld: 125 mg/dL — ABNORMAL HIGH (ref 70–99)
Potassium: 3.3 mmol/L — ABNORMAL LOW (ref 3.5–5.1)
Sodium: 149 mmol/L — ABNORMAL HIGH (ref 135–145)
Total Bilirubin: 0.3 mg/dL (ref 0.3–1.2)
Total Protein: 7 g/dL (ref 6.5–8.1)

## 2020-05-20 LAB — BPAM CRYOPRECIPITATE
Blood Product Expiration Date: 202108212130
Blood Product Expiration Date: 202108212130
ISSUE DATE / TIME: 202108211557
ISSUE DATE / TIME: 202108211557
Unit Type and Rh: 5100
Unit Type and Rh: 5100

## 2020-05-20 LAB — HEMOGLOBIN A1C
Hgb A1c MFr Bld: 5.6 % (ref 4.8–5.6)
Mean Plasma Glucose: 114.02 mg/dL

## 2020-05-20 LAB — SODIUM
Sodium: 149 mmol/L — ABNORMAL HIGH (ref 135–145)
Sodium: 154 mmol/L — ABNORMAL HIGH (ref 135–145)

## 2020-05-20 LAB — PREGNANCY, URINE: Preg Test, Ur: NEGATIVE

## 2020-05-20 LAB — ECHOCARDIOGRAM COMPLETE
Height: 64 in
S' Lateral: 2.6 cm
Weight: 2970.04 oz

## 2020-05-20 LAB — MAGNESIUM: Magnesium: 1.9 mg/dL (ref 1.7–2.4)

## 2020-05-20 LAB — MRSA PCR SCREENING: MRSA by PCR: NEGATIVE

## 2020-05-20 LAB — D-DIMER, QUANTITATIVE: D-Dimer, Quant: 16.13 ug/mL-FEU — ABNORMAL HIGH (ref 0.00–0.50)

## 2020-05-20 LAB — PHOSPHORUS: Phosphorus: 3.6 mg/dL (ref 2.5–4.6)

## 2020-05-20 LAB — FERRITIN: Ferritin: 30 ng/mL (ref 11–307)

## 2020-05-20 LAB — C-REACTIVE PROTEIN: CRP: 2.4 mg/dL — ABNORMAL HIGH (ref <1.0)

## 2020-05-20 LAB — TRIGLYCERIDES: Triglycerides: 105 mg/dL (ref <150)

## 2020-05-20 MED ORDER — NIMODIPINE 6 MG/ML PO SOLN
60.0000 mg | ORAL | Status: DC
  Administered 2020-05-20 – 2020-05-29 (×51): 60 mg via ORAL
  Filled 2020-05-20 (×58): qty 10

## 2020-05-20 MED ORDER — CHLORHEXIDINE GLUCONATE CLOTH 2 % EX PADS
6.0000 | MEDICATED_PAD | Freq: Every day | CUTANEOUS | Status: DC
  Administered 2020-05-21 – 2020-06-07 (×16): 6 via TOPICAL

## 2020-05-20 MED ORDER — SODIUM CHLORIDE 3 % IV SOLN
INTRAVENOUS | Status: DC
  Filled 2020-05-20 (×6): qty 500

## 2020-05-20 NOTE — Progress Notes (Signed)
Facilitated virtual visit w/ family

## 2020-05-20 NOTE — Progress Notes (Signed)
Patient self extubated around 1100.  RT and CCM MD notified.  Patient currently on 4 liters nasal cannula.  No stridors noted.  RN to continue to monitor.

## 2020-05-20 NOTE — Progress Notes (Signed)
Foot draw allow for labs per CCM.  Lab technician notified.

## 2020-05-20 NOTE — Progress Notes (Addendum)
Patient ID: Gail Montgomery, female   DOB: Apr 03, 1996, 24 y.o.   MRN: [id number] BP 137/84   Pulse 73   Temp 98.9 F (37.2 C) (Axillary)   Resp 15   Ht 5\' 4"  (1.626 m)   Wt 84.2 kg   SpO2 100%   BMI 31.86 kg/m  Films reviewed, mri and ct. No significant difference in ventricular size. Sylvian fissure still patent, basilar cisterns widely patent.  Given the presence of the antipalatelet drugs, and the lack of change on the CT, I do not believe there is either urgent, or emergent need for ventricular catheter placement at this time.

## 2020-05-20 NOTE — Progress Notes (Signed)
PCCM:  Called to bedside. Patient self extubated.  Propofol turned off  Following commands Oxygenating appropriately  On 2L [place], sats 100%  Cased discussed with Dr. Roda Shutters. Continued stroke management per neurology   Pulmonary will be available.  Josephine Igo, DO [place] [facility] 05/20/2020 11:22 AM

## 2020-05-20 NOTE — Progress Notes (Signed)
Pt transported to CT then to MRI and back with no complications.

## 2020-05-20 NOTE — Progress Notes (Signed)
Brief Neuro Note:  Serum Sodium level at 154. Discussed with RN.  Recs: - Decrease 3% Hypertonic saline to 25cc/hr.

## 2020-05-20 NOTE — Progress Notes (Signed)
PT Cancellation Note  Patient Details Name: Gail Montgomery MRN: [id number] DOB: December 09, 1995   Cancelled Treatment:    Reason Eval/Treat Not Completed: Active bedrest order   Ilda Foil 05/20/2020, 8:42 AM  Aida Raider, PT  Office # [phone] Pager [phone]

## 2020-05-20 NOTE — Progress Notes (Signed)
Brief Neuro Update:  Reviewed CTH and MRI with some early hydrocephalus. Temporal horns appear more prominent and she has some redistribution and some increase in ICH. Basilar cistern is patent. Neuro exam with BL pupils equal round and reactive. Symmetric grimace to pain, BL corneals +. She localizes in RUE and withdraws BL lower extremities to pain. Exam consistent with prior neuro exams.  - Discussed with Neurosurg, would like to hold off on EVD at this time given the presence of Antiplatelets. - I ordered 3% HTS at 50cc/Hr with goal Na of 145-150. - Q6H Sodium checks - Q1 Hour Neuro checks - STAT CTH for any anisocoria or non reactive pupil, abducen nerve palsy, cushings triad(bradycardia, resp depression, HTN) or worsening neuro deficit. - Mom updated.   Erick Blinks [facility] Pager Number [phone]

## 2020-05-20 NOTE — Progress Notes (Signed)
STROKE TEAM PROGRESS NOTE   INTERVAL HISTORY No family is at the bedside.  Patient still intubated on sedation.  Able to intermittently open eyes on voice and tactile stimulation, but not able to maintain wakefulness.  On ventilation, still has left gaze preference and right hemiplegia.  CT head stable for SAH and IVH with slight larger left temporal horn.  Neurosurgery on board, not indicated for EVD yet.  Will repeat CT in the a.m.  Updated patient parents over the phone.  OBJECTIVE Vitals:   05/20/20 0400 05/20/20 0500 05/20/20 0600 05/20/20 0700  BP: 126/77 127/78 123/81 120/70  Pulse: 71  85 71  Resp: 15 15 17 15   Temp: 99.4 F (37.4 C)     TempSrc: Axillary     SpO2: 100%  100% 100%  Weight:      Height:        CBC:  Recent Labs  Lab 05/19/20 1120 05/19/20 1122 05/19/20 1732 05/19/20 2232  WBC 4.8  --   --  7.4  NEUTROABS 1.8  --   --  6.5  HGB 12.2   < > 11.9* 12.2  HCT 38.5   < > 35.0* 37.9  MCV 90.6  --   --  88.3  PLT 285  --   --  288   < > = values in this interval not displayed.    Basic Metabolic Panel:  Recent Labs  Lab 05/19/20 1120 05/19/20 1120 05/19/20 1122 05/19/20 1122 05/19/20 1732 05/19/20 1807  NA 138   < > 140   < > 140 139  K 4.0   < > 4.3   < > 3.5 3.8  CL 108   < > 108  --   --  109  CO2 18*  --   --   --   --  21*  GLUCOSE 103*   < > 101*  --   --  111*  BUN 6   < > 6  --   --  5*  CREATININE 0.64   < > 0.50  --   --  0.66  CALCIUM 8.7*  --   --   --   --  8.2*   < > = values in this interval not displayed.    Lipid Panel:     Component Value Date/Time   TRIG 43 05/19/2020 1807   HgbA1c: No results found for: HGBA1C Urine Drug Screen: No results found for: LABOPIA, COCAINSCRNUR, LABBENZ, AMPHETMU, THCU, LABBARB  Alcohol Level No results found for: [facility]  [facility]AGING  CT Code Stroke CTA Head W/WO contrast CT Code Stroke CTA Neck W/WO contrast 05/19/2020 IMPRESSION:  1. Emergent large vessel occlusion at the left ICA terminus  continuing into the MCA.  2. Mildly indistinct appearance of the proximal left ICA is likely from motion artifact, consider cervical run. No atheromatous changes or vasculopathy seen in the neck.   CT HEAD CODE STROKE WO CONTRAST 05/19/2020 IMPRESSION:  Dense left ICA terminus and MCA. No acute hemorrhage. ASPECTS is 10.   CT HEAD WO CONTRAST 05/20/2020 IMPRESSION:  1. Subarachnoid hemorrhage along the anterior interhemispheric fissure with slightly increased extension into both lateral ventricles.  2. Slight increase in size of the temporal horn of the left lateral ventricle consistent with early communicating hydrocephalus.   CT HEAD WO CONTRAST 05/19/2020 IMPRESSION:  New large volume of subarachnoid and intraventricular hemorrhage.  MR BRAIN WO CONTRAST 05/20/2020 IMPRESSION:  1. Early subacute infarct of the left basal ganglia  involving the posterior putamen and caudate body.  2. Subarachnoid hemorrhage along the interhemispheric fissure extending into the lateral ventricles.  3. 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   DG CHEST PORT 1 VIEW 05/19/2020 IMPRESSION:  1. Endotracheal tube and NG tube placement.  2. Mildly low volume film with mild bibasilar opacities which may represent atelectasis or infection/pneumonia.   Neuro Interventional Radiology - Cerebral Angiogram with Intervention - Dr Loreta Ave 05/19/20 2:27 PM Left MCA:  Baseline TICI 0 First Pass Device:                 Local aspiration and solitaire 4 x 40             Result                         TICI 0 Second Pass Device:           Local aspiration and solitaire 4 x 40             Result                         TICI 3 Left ACA/pericallosal Baseline TICI 0 Final TICI 0 pericallosal artery Findings:                                Patent right CFA Left M1 occlusion, final is TICI 3 Left ACA A2 occlusion migrated to pericallosal artery during the case.   Could not complete a first pass safely.  Significant irregularity of the cervical ICA, concerning for dissection, that resolved after observation, and once DAPT was initiated.  The final appearance may represent spasm vs FMD/vasculitis  Transthoracic Echocardiogram  00/00/2021 Pending  Bilateral Lower Extremity Venous Dopplers - pending  PHYSICAL EXAM  Temp:  [98.8 F (37.1 C)-99.4 F (37.4 C)] 99 F (37.2 C) (08/22 0800) Pulse Rate:  [57-101] 69 (08/22 1100) Resp:  [15-27] 15 (08/22 1100) BP: (109-140)/(63-103) 116/67 (08/22 1100) SpO2:  [88 %-100 %] 100 % (08/22 1100) FiO2 (%):  [40 %-80 %] 40 % (08/22 0748)  General - Well nourished, well developed, intubated on propofol.  Ophthalmologic - fundi not visualized due to noncooperation.  Cardiovascular - Regular rate and rhythm.  Neuro - intubated on propofol, eyes closed, able to open with repetitive stimulation. Able to follow peripheral commands on the right hand and leg, did not "wiggle toes" for me. Eyes left gaze preference, not able to gaze to right, barely cross midline. Not blinking to visual threat bilaterally. PERRL, doll's eye present. Strong corneals bilateral and gag/cough. Breathing not over the vent.  Facial symmetry not able to test due to ET tube.  Tongue protrusion not cooperative. Pt spontaneously move left arm and leg intermittently. On pain stimulation, against gravity of LUE and strong withdraw LLE. However, flaccid RUE and mild withdraw RLE. DTR 1+ and no babinski. Sensation, coordination and gait not tested.   ASSESSMENT/PLAN Gail Montgomery is a 24 y.o. female with history of tobacco use, anxiety (panic attacks) and hormonal birth control presented with right-sided weakness and aphasia that started abruptly at 9:30 AM.  COVID - positive. IV t-PA - Saturday 05/19/20 at 1145. Thrombectomy - Dr Loreta Ave - Left M1 occlusion, final is TICI 3.   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 Paradoxical emboli vs. COVID hypercoagulability in the setting of OCP use   CT Head - Dense left ICA terminus and MCA. No acute hemorrhage. ASPECTS is 10.      CTA H&N - Emergent large vessel occlusion at the left ICA terminus continuing into the MCA. Mildly indistinct appearance of the proximal left ICA is likely from motion artifact, consider cervical run. No atheromatous changes or vasculopathy seen in the neck.  IR - left MCA and A2 occlusion s/p TICI3 of MCA and TICI0 of ACA. Initially left ICA stenosis post IR but resolved on the final run  CT head 8/21 - New large volume of subarachnoid and intraventricular hemorrhage.   CT head 8/22 [facility] along the anterior interhemispheric fissure with slightly increased extension into both lateral ventricles. Slight increase in size of the temporal horn of the left lateral ventricle consistent with early communicating hydrocephalus.   MRI head -  Early subacute infarct of the left basal ganglia involving the posterior putamen and caudate body.  LE Venous Dopplers - pending  2D Echo - pending  Will do TCD bubble study once stable  [employer] Virus 2 - positive  LDL - 78  HgbA1c 5.6  UDS - pending  VTE prophylaxis - SCDs  No antithrombotic prior to admission, now on No antithrombotic due to s/p tPA and SAH and IVH  Ongoing aggressive stroke risk factor management  Therapy recommendations:  pending  Disposition:  Pending  Hydrocephalus  CT head showed mildly increased size of left temporal horn  CT repeat in am  NSG on board  3% hypertonic saline - 50 cc/hr  Na goal 145-155  Na check Q6  Na - 139->149  BP management  Stable not on IV BP meds  On Nimodipine for [facility] - not sure if needed - will discuss with NSG  BP goal < 160 given SAH and IVH  Close BP monitoring . Long-term BP  goal normotensive  Hyperlipidemia  Home Lipid lowering medication: none   LDL 78, goal < 70  No statin at this time due to [facility] and IVH  Continue statin at discharge  Tobacco abuse  Intermittent smoking per mom  Smoking cessation counseling will be provided  Other Stroke Risk Factors  ETOH use, advised to drink no more than 1 alcoholic beverage per day  Obesity, Body mass index is 31.86 kg/m., recommend weight loss, diet and exercise as appropriate   Hormonal birth control - both progesterone and estrogen - Estarylla  Other Active Problems  Code status - Full code  Hospital day # 1  This patient is critically ill due to left MCA stroke, left terminal ICA occlusion, left MCA and ACA occlusion, status post thrombectomy and TPA, SAH, IVH, hydrocephalus, and at significant risk of neurological worsening, death form recurrent stroke, hemorrhagic conversion, obstructive hydrocephalus, brain herniation, worsening SAH and IVH, seizure. This patient's care requires constant monitoring of vital signs, hemodynamics, respiratory and cardiac monitoring, review of multiple databases, neurological assessment, discussion with family, other specialists and medical decision making of high complexity. I spent 45 minutes of neurocritical care time in the care of this patient. I had long discussion with parents over the phone, updated pt current condition, treatment plan and potential prognosis, and answered all the questions.  They expressed understanding and appreciation.   Marvel Plan,  MD PhD Stroke Neurology 05/20/2020 4:58 PM    To contact Stroke Continuity provider, please refer to [email or web address]. After hours, contact General Neurology

## 2020-05-20 NOTE — Progress Notes (Signed)
SLP Cancellation Note  Patient Details Name: Gail Montgomery MRN: [id number] DOB: Apr 24, 1996   Cancelled treatment:       Reason Eval/Treat Not Completed: Medical issues which prohibited therapy. Orders received for cognitive evaluation. Patient just self extubated. O2 sats at 100%, patient sleeping soundly. Will hold eval this date and f/u 8/23 to allow patient time to adjust to being off vent and ensure stability.   Ferdinand Lango MA, CCC-SLP     Lilliam Chamblee Meryl 05/20/2020, 11:27 AM

## 2020-05-20 NOTE — Procedures (Signed)
Extubation Procedure Note  Patient Details:   Name: Gail Montgomery DOB: [birth year] MRN: [id number]   Airway Documentation:    Vent end date: 05/20/20 Vent end time: 1100   Evaluation  O2 sats: stable throughout Complications: No apparent complications Patient did tolerate procedure well. Bilateral Breath Sounds: Clear, Diminished   No, patient self extubated.  CCM at bedside.  Patient doing well placed on 4L [place]  [name] 05/20/2020, 11:16 AM

## 2020-05-21 ENCOUNTER — Inpatient Hospital Stay (HOSPITAL_COMMUNITY): Payer: BC Managed Care – PPO

## 2020-05-21 ENCOUNTER — Encounter (HOSPITAL_COMMUNITY): Payer: Self-pay | Admitting: Radiology

## 2020-05-21 ENCOUNTER — Inpatient Hospital Stay: Payer: Self-pay

## 2020-05-21 DIAGNOSIS — U071 COVID-19: Secondary | ICD-10-CM

## 2020-05-21 DIAGNOSIS — R1312 Dysphagia, oropharyngeal phase: Secondary | ICD-10-CM

## 2020-05-21 DIAGNOSIS — I639 Cerebral infarction, unspecified: Secondary | ICD-10-CM

## 2020-05-21 LAB — CBC WITH DIFFERENTIAL/PLATELET
Abs Immature Granulocytes: 0.03 10*3/uL (ref 0.00–0.07)
Basophils Absolute: 0 10*3/uL (ref 0.0–0.1)
Basophils Relative: 0 %
Eosinophils Absolute: 0 10*3/uL (ref 0.0–0.5)
Eosinophils Relative: 0 %
HCT: 35.4 % — ABNORMAL LOW (ref 36.0–46.0)
Hemoglobin: 11.2 g/dL — ABNORMAL LOW (ref 12.0–15.0)
Immature Granulocytes: 0 %
Lymphocytes Relative: 13 %
Lymphs Abs: 1.5 10*3/uL (ref 0.7–4.0)
MCH: 28.9 pg (ref 26.0–34.0)
MCHC: 31.6 g/dL (ref 30.0–36.0)
MCV: 91.2 fL (ref 80.0–100.0)
Monocytes Absolute: 1 10*3/uL (ref 0.1–1.0)
Monocytes Relative: 10 %
Neutro Abs: 8.3 10*3/uL — ABNORMAL HIGH (ref 1.7–7.7)
Neutrophils Relative %: 77 %
Platelets: 243 10*3/uL (ref 150–400)
RBC: 3.88 MIL/uL (ref 3.87–5.11)
RDW: 13.2 % (ref 11.5–15.5)
WBC: 10.9 10*3/uL — ABNORMAL HIGH (ref 4.0–10.5)
nRBC: 0 % (ref 0.0–0.2)

## 2020-05-21 LAB — COMPREHENSIVE METABOLIC PANEL
ALT: 11 U/L (ref 0–44)
AST: 23 U/L (ref 15–41)
Albumin: 2.7 g/dL — ABNORMAL LOW (ref 3.5–5.0)
Alkaline Phosphatase: 50 U/L (ref 38–126)
Anion gap: 10 (ref 5–15)
BUN: 8 mg/dL (ref 6–20)
CO2: 19 mmol/L — ABNORMAL LOW (ref 22–32)
Calcium: 8.6 mg/dL — ABNORMAL LOW (ref 8.9–10.3)
Chloride: 119 mmol/L — ABNORMAL HIGH (ref 98–111)
Creatinine, Ser: 0.57 mg/dL (ref 0.44–1.00)
GFR calc Af Amer: >60 mL/min (ref >60)
GFR calc non Af Amer: >60 mL/min (ref >60)
Glucose, Bld: 115 mg/dL — ABNORMAL HIGH (ref 70–99)
Potassium: 4.4 mmol/L (ref 3.5–5.1)
Sodium: 148 mmol/L — ABNORMAL HIGH (ref 135–145)
Total Bilirubin: 0.6 mg/dL (ref 0.3–1.2)
Total Protein: 6.6 g/dL (ref 6.5–8.1)

## 2020-05-21 LAB — SODIUM: Sodium: 148 mmol/L — ABNORMAL HIGH (ref 135–145)

## 2020-05-21 LAB — MAGNESIUM: Magnesium: 2 mg/dL (ref 1.7–2.4)

## 2020-05-21 LAB — C-REACTIVE PROTEIN: CRP: 3.3 mg/dL — ABNORMAL HIGH (ref <1.0)

## 2020-05-21 LAB — FERRITIN: Ferritin: 37 ng/mL (ref 11–307)

## 2020-05-21 LAB — D-DIMER, QUANTITATIVE: D-Dimer, Quant: 17.44 ug/mL-FEU — ABNORMAL HIGH (ref 0.00–0.50)

## 2020-05-21 LAB — PHOSPHORUS: Phosphorus: 3.2 mg/dL (ref 2.5–4.6)

## 2020-05-21 MED ORDER — SODIUM CHLORIDE 0.9% FLUSH: 10.0000 mL | INTRAVENOUS | Status: DC | PRN

## 2020-05-21 MED ORDER — SODIUM CHLORIDE 0.9% FLUSH
10.0000 mL | Freq: Two times a day (BID) | INTRAVENOUS | Status: DC
  Administered 2020-05-21: 20 mL
  Administered 2020-05-22: 10 mL
  Administered 2020-05-22: 20 mL
  Administered 2020-05-23: 10 mL
  Administered 2020-05-23: 20 mL
  Administered 2020-05-24 – 2020-05-30 (×14): 10 mL

## 2020-05-21 MED ORDER — ORAL CARE MOUTH RINSE
15.0000 mL | Freq: Two times a day (BID) | OROMUCOSAL | Status: DC
  Administered 2020-05-21 – 2020-06-07 (×31): 15 mL via OROMUCOSAL

## 2020-05-21 NOTE — Progress Notes (Signed)
Occupational Therapy Evaluation Patient Details Name: Gail Montgomery MRN: [id number] DOB: [birth year] Today's Date: 05/21/2020    History of Present Illness 24 yo who had acute R sided weakness and aphasia 05/19/20 due to LVO at [facility]. Screened Covid + but asymptomatic; tPA and underwent thrombectomy; during procedure had ICA vasospasm vs dissection.  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 Pt self extubated 05/20/20.    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 Pt lethargic during session however following 1 step commands with delay when alert. attempting to use RUE, however pt with apparent sensory motor deficits, limiting the functional use of RUE. Able to mobilize to EOB with +2 mod A and +2 Max A to stand. Most likely orthostatic due to change in affect and appeared uncomfortable ( BP supine prior to activity 150/76; supine after return from sitting 134/77; HR 60s; SpO2 100 RA). Once EOB, pt did attempt to verbalize however unable to understand what pt was saying Pt assisted with bathing @ bed level during session with Max A. Total A with LB dressing. At this time recommend intensive rehab at [facility] to maximize functional level of independence. PT spoke to Mom over the phone who is very supportive and plans on her daughter discharging home to live with her. Will follow acutely.     Follow Up Recommendations  CIR;Supervision/Assistance - 24 hour    Equipment Recommendations  3 in 1 bedside commode;Other (comment) (Will further assess)    Recommendations for Other Services Rehab consult     Precautions / Restrictions Precautions Precautions: Fall      Mobility Bed Mobility Overal bed mobility: Needs Assistance Bed Mobility: Supine to Sit;Sit to Supine     Supine to  sit: Mod assist;+2 for physical assistance Sit to supine: Mod assist;+2 for physical assistance   General bed mobility comments: multimodal cues to initiate then pt assisting with moving LLE and reaching with LUE to mobilize to EOB; when returning to supine, pt indicating she was ready to lay down, then lifted LLE back onto bed and helped recline ; uncontrolled descent of trunk  Transfers Overall transfer level: Needs assistance Equipment used: 2 person hand held assist Transfers: Sit to/from Stand Sit to Stand: Mod assist;+2 physical assistance         General transfer comment: Able to stand however, required total A to maintain R knee in extension due to buckling with attempting to side step to L    Balance Overall balance assessment: Needs assistance   Sitting balance-Leahy Scale: Poor Sitting balance - Comments: requires A to maintain balance EOB; at times leaning R; leaning her head onto me; may have been due to not feeling well     Standing balance-Leahy Scale: Zero                             ADL either performed or assessed with clinical judgement   ADL Overall ADL's : Needs assistance/impaired Eating/Feeding: NPO   Grooming: Maximal assistance   Upper Body Bathing: Maximal assistance;Bed level   Lower Body Bathing: Maximal assistance;Bed level   Upper Body Dressing : Maximal assistance   Lower Body Dressing: Total assistance;Bed level       Toileting- Clothing Manipulation and Hygiene: Total assistance  Functional mobility during ADLs: Maximal assistance;+2 for physical assistance;Cueing for safety;Cueing for sequencing General ADL Comments: Incorporated bathign in session; limited by weakness; motor planning and decreased attention     Vision   Additional Comments: poor visual attention; note increased head turns with saccades; will further assess   ? R field deficit?  Perception Perception Comments: Decreased awareness of positional  sense. Will assess   Praxis Praxis Praxis tested?: Deficits Deficits: Organization;Limb apraxia Praxis-Other Comments: Appears to have difficulty coordinating movemetn of RUE    Pertinent Vitals/Pain Pain Assessment: Faces Faces Pain Scale: No hurt     Hand Dominance Right   Extremity/Trunk Assessment Upper Extremity Assessment Upper Extremity Assessment: RUE deficits/detail RUE Deficits / Details: generalized weakness but moving RUE; attempts to use functionally; able to grasp washcloth; max difficulty with in-hand manipulation skilss; unable to bring R hand cross body to L axilla; attempts to bath self but difficulty maintaining grasp on cloth; also limited by poor attention RUE Sensation: decreased proprioception RUE Coordination: decreased fine motor;decreased gross motor   Lower Extremity Assessment Lower Extremity Assessment: Defer to PT evaluation   Cervical / Trunk Assessment Cervical / Trunk Assessment: Other exceptions (R preference in bed and R at times EOB)   Communication Communication Communication: Receptive difficulties;Expressive difficulties   Cognition Arousal/Alertness: Lethargic Behavior During Therapy: Flat affect;Restless Overall Cognitive Status: Difficult to assess                                 General Comments: delay in processing;    General Comments  lived with boyfriend in off campus housing at [facility]; is Education officer, museum Instructions      Home Living Family/patient expects to be discharged to:: Private residence Living Arrangements: Spouse/significant other Available Help at Discharge: Family;Available 24 hours/day  will need to gather info on home set up                           Additional Comments: plan is for pt to live with Mom after DC      Prior Functioning/Environment Level of Independence: Independent (student at [employer])        Comments: full-time  Consulting civil engineer at [employer]        OT Problem List: Decreased strength;Decreased range of motion;Decreased activity tolerance;Impaired balance (sitting and/or standing);Impaired vision/perception;Decreased coordination;Decreased cognition;Decreased safety awareness;Decreased knowledge of use of DME or AE;Cardiopulmonary status limiting activity;Obesity;Impaired UE functional use      OT Treatment/Interventions: Self-care/ADL training;Therapeutic exercise;Neuromuscular education;Energy conservation;DME and/or AE instruction;Therapeutic activities;Splinting;Cognitive remediation/compensation;Visual/perceptual remediation/compensation;Patient/family education;Balance training    OT Goals(Current goals can be found in the care plan section) Acute Rehab OT Goals Patient Stated Goal: unable to state OT Goal Formulation: Patient unable to participate in goal setting Time For Goal Achievement: 06/04/20 Potential to Achieve Goals: Good ADL Goals Pt Will Perform Grooming: with min assist;sitting Pt Will Perform Upper Body Bathing: with min assist;sitting Pt Will Perform Lower Body Bathing: with mod assist;sit to/from stand Pt Will Transfer to Toilet: with mod assist;bedside commode;squat pivot transfer Additional ADL Goal #1: Pt will demosntrate sustained attnetion to task in nondistrating enviornment with minimal redurectional cues  OT Frequency: Min 2X/week   Barriers to D/C:            Co-evaluation PT/OT/SLP Co-Evaluation/Treatment: Yes Reason for Co-Treatment: Complexity of the patient's impairments (multi-system involvement);For  patient/therapist safety;To address functional/ADL transfers   OT goals addressed during session: ADL's and self-care      AM-PAC OT "6 Clicks" Daily Activity     Outcome Measure Help from another person eating meals?: Total Help from another person taking care of personal grooming?: A Lot Help from another person toileting, which includes using toliet, bedpan, or  urinal?: Total Help from another person bathing (including washing, rinsing, drying)?: A Lot Help from another person to put on and taking off regular upper body clothing?: A Lot Help from another person to put on and taking off regular lower body clothing?: Total 6 Click Score: 9   End of Session Nurse Communication: Mobility status  Activity Tolerance: Patient tolerated treatment well Patient left: in bed;with call bell/phone within reach;with bed alarm set;Other (comment) (modified chair position)  OT Visit Diagnosis: Unsteadiness on feet (R26.81);Other abnormalities of gait and mobility (R26.89);Muscle weakness (generalized) (M62.81);Apraxia (R48.2);Other symptoms and signs involving cognitive function;Cognitive communication deficit (R41.841);Other symptoms and signs involving the nervous system (R29.898);Hemiplegia and hemiparesis Symptoms and signs involving cognitive functions: Cerebral infarction;Nontraumatic SAH Hemiplegia - Right/Left: Right Hemiplegia - dominant/non-dominant: Dominant Hemiplegia - caused by: Cerebral infarction;Nontraumatic SAH                Time: 0981-1914 OT Time Calculation (min): 37 min Charges:  OT General Charges $OT Visit: 1 Visit OT Evaluation $OT Eval Moderate Complexity: 1 Mod  Tamila Gaulin, OT/L   Acute OT Clinical Specialist Acute Rehabilitation Services Pager [phone] Office [phone]   [facility] 05/21/2020, 1:40 PM

## 2020-05-21 NOTE — Progress Notes (Signed)
Assisted tele visit to patient with boyfriend.  Gail Uselton M Aliyha Fornes, RN   

## 2020-05-21 NOTE — Plan of Care (Signed)
  Problem: Coping: Goal: Will identify appropriate support needs Outcome: Progressing   

## 2020-05-21 NOTE — Evaluation (Signed)
Clinical/Bedside Swallow Evaluation Patient Details  Name: Gail Montgomery MRN: [id number] Date of Birth: 03/18/1996  Today's Date: 05/21/2020 Time: SLP Start Time (ACUTE ONLY): 0815 SLP Stop Time (ACUTE ONLY): 0830 SLP Time Calculation (min) (ACUTE ONLY): 15 min  Past Medical History:  Past Medical History:  Diagnosis Date  . Anxiety   . Panic attack    Past Surgical History:  Past Surgical History:  Procedure Laterality Date  . IR CT HEAD LTD  05/19/2020  . IR CT HEAD LTD  05/19/2020  . IR PERCUTANEOUS ART THROMBECTOMY/INFUSION INTRACRANIAL INC DIAG ANGIO  05/19/2020  . IR US GUIDE VASC ACCESS RIGHT  05/19/2020  . RADIOLOGY WITH ANESTHESIA N/A 05/19/2020   Procedure: IR WITH ANESTHESIA;  Surgeon: Radiologist, Medication, MD;  Location: [facility] OR;  Service: Radiology;  Laterality: N/A;   HPI:  Pt is a 24 yo female presenting with sudden onset R sided weakness and aphasia. 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 She was intubated for thrombectomy 8/21. 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 MRI showed early subacute infarct of the left basal ganglia involving the posterior putamen and caudate body. Pt self-extubated 8/22. Pt tested (+) for COVID-19 but was asymptomatic upon arrival. PMH: panic attack, anxiety   Assessment / Plan / Recommendation Clinical Impression  Pt's voice is soft and she has several subswallows per bolus regardless of consistency, suggestive of dysphagia that could be neurogenic and/or post-extubation changes in the setting of self-extubation. Coughing and throat clearing are noted with all liquids when challenged with larger boluses, but not with larger bites of purees. She clears her mouth well and seems to masticate ice chips swiftly. Recommend providing meds crushed in puree and ice chips after oral care. Pt has potential for quicker resolution of any post-extubation dysphagia. Will f/u for potential to begin PO diet vs need for instrumental  testing.   SLP Visit Diagnosis: Dysphagia, unspecified (R13.10)    Aspiration Risk  Moderate aspiration risk    Diet Recommendation NPO except meds;Ice chips PRN after oral care   Medication Administration: Crushed with puree    Other  Recommendations Oral Care Recommendations: Oral care QID   Follow up Recommendations Inpatient Rehab      Frequency and Duration min 2x/week  2 weeks       Prognosis Prognosis for Safe Diet Advancement: Good Barriers to Reach Goals: Language deficits      Swallow Study   General HPI: Pt is a 24 yo female presenting with sudden onset R sided weakness and aphasia. 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 She was intubated for thrombectomy 8/21. 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 MRI showed early subacute infarct of the left basal ganglia involving the posterior putamen and caudate body. Pt self-extubated 8/22. Pt tested (+) for COVID-19 but was asymptomatic upon arrival. PMH: panic attack, anxiety Type of Study: Bedside Swallow Evaluation Previous Swallow Assessment: none in chart Diet Prior to this Study: NPO Temperature Spikes Noted: No Respiratory Status: Room air History of Recent Intubation: Yes Length of Intubations (days): 1 days Date extubated: 05/20/20 Behavior/Cognition: Alert;Cooperative;Requires cueing Oral Cavity Assessment: Within Functional Limits Oral Care Completed by SLP: No Oral Cavity - Dentition: Adequate natural dentition Vision: Functional for self-feeding Self-Feeding Abilities: Able to feed self Patient Positioning: Upright in bed Baseline Vocal Quality: Low vocal intensity Volitional Swallow: Able to elicit    Oral/Motor/Sensory Function Overall Oral Motor/Sensory Function: Within  functional limits   Ice Chips Ice chips: Impaired Presentation: Spoon Pharyngeal Phase Impairments: Multiple swallows   Thin Liquid Thin Liquid: Impaired Presentation: Self Fed;Straw Pharyngeal   Phase Impairments: Multiple swallows;Throat Clearing - Immediate;Cough - Delayed    Nectar Thick Nectar Thick Liquid: Impaired Presentation: Self Fed;Straw Pharyngeal Phase Impairments: Multiple swallows;Throat Clearing - Delayed;Cough - Delayed   Honey Thick Honey Thick Liquid: Impaired Presentation: Cup;Self fed;Straw Pharyngeal Phase Impairments: Multiple swallows;Throat Clearing - Delayed;Cough - Delayed   Puree Puree: Impaired Presentation: Spoon Pharyngeal Phase Impairments: Multiple swallows   Solid     Solid: Not tested      Mahala Menghini., M.A. CCC-SLP Acute Rehabilitation Services Pager [phone] Office [phone]  05/21/2020,9:12 AM

## 2020-05-21 NOTE — Progress Notes (Signed)
Peripherally Inserted Central Catheter Placement  The IV Nurse has discussed with the patient and/or persons authorized to consent for the patient, the purpose of this procedure and the potential benefits and risks involved with this procedure.  The benefits include less needle sticks, lab draws from the catheter, and the patient may be discharged home with the catheter. Risks include, but not limited to, infection, bleeding, blood clot (thrombus formation), and puncture of an artery; nerve damage and irregular heartbeat and possibility to perform a PICC exchange if needed/ordered by physician.  Alternatives to this procedure were also discussed.  Bard Power PICC patient education guide, fact sheet on infection prevention and patient information card has been provided to patient /or left at bedside.  Telephone consent obtained from mother due to altered mental status.  PICC Placement Documentation  PICC Double Lumen 05/21/20 PICC Left Brachial 38 cm 0 cm (Active)  Indication for Insertion or Continuance of Line Poor Vasculature-patient has had multiple peripheral attempts or PIVs lasting less than 24 hours 05/21/20 2309  Exposed Catheter (cm) 0 cm 05/21/20 2309  Site Assessment Clean;Dry;Intact 05/21/20 2309  Lumen #1 Status Flushed;Saline locked;Blood return noted 05/21/20 2309  Lumen #2 Status Flushed;Saline locked;Blood return noted 05/21/20 2309  Dressing Type Transparent 05/21/20 2309  Dressing Status Clean;Dry;Intact;Antimicrobial disc in place 05/21/20 2309  Dressing Change Due 05/28/20 05/21/20 2309       Tory Mckissack, Lajean Manes 05/21/2020, 11:10 PM

## 2020-05-21 NOTE — Progress Notes (Signed)
Rehab Admissions Coordinator Note:  Patient was screened by Clois Dupes for appropriateness for an Inpatient Acute Rehab Consult per therapy recs. Noted  COVID + 8/21. CIR is able to consider admit once >20 days since COVID + or once has 2 negative COVID tests.I will follow.Clois Dupes RN MSN 05/21/2020, 6:38 PM  I can be reached at [phone].

## 2020-05-21 NOTE — Evaluation (Addendum)
Speech Language Pathology Evaluation Patient Details Name: Gail Montgomery MRN: [id number] DOB: 04-May-1996 Today's Date: 05/21/2020 Time: 5784-6962 SLP Time Calculation (min) (ACUTE ONLY): 10 min  Problem List:  Patient Active Problem List   Diagnosis Date Noted  . 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 Anxiety   . Panic attack    Past Surgical History:  Past Surgical History:  Procedure Laterality Date  . IR CT HEAD LTD  05/19/2020  . IR CT HEAD LTD  05/19/2020  . IR PERCUTANEOUS ART THROMBECTOMY/INFUSION INTRACRANIAL INC DIAG ANGIO  05/19/2020  . IR US GUIDE VASC ACCESS RIGHT  05/19/2020  . RADIOLOGY WITH ANESTHESIA N/A 05/19/2020   Procedure: IR WITH ANESTHESIA;  Surgeon: Radiologist, Medication, MD;  Location: [facility] OR;  Service: Radiology;  Laterality: N/A;   HPI:  Pt is a 24 yo female presenting with sudden onset R sided weakness and aphasia. 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 She was intubated for thrombectomy 8/21. 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 MRI showed early subacute infarct of the left basal ganglia involving the posterior putamen and caudate body. Pt self-extubated 8/22. Pt tested (+) for COVID-19 but was asymptomatic upon arrival. PMH: panic attack, anxiety   Assessment / Plan / Recommendation Clinical Impression  Pt presents with aphasia c/b both receptive and expressive difficulties. Min cues were needed for completion of one-step commands, and she is ~75% accurate with simple yes/no questions, using head nods to respond. Pt benefits from extra time for processing and at times, needing repetition. She had spontaneous verbal output x1 but it was incomprehensible. She approximated her articulators to attempt repetition at the word level and singing with familiar song, but had trouble pairing this with initiation of phonation. At times she seems to lose her attention during  tasks. She would benefit from SLP f/u to maximize functional communication.   UPDATE: SLP reached out to pt's mother this afternoon and provided a review of current level of function and POC.     SLP Assessment  SLP Recommendation/Assessment: Patient needs continued Speech Lanaguage Pathology Services SLP Visit Diagnosis: Aphasia (R47.01)    Follow Up Recommendations  Inpatient Rehab    Frequency and Duration min 2x/week  2 weeks      SLP Evaluation Cognition  Overall Cognitive Status: Difficult to assess Arousal/Alertness: Awake/alert Orientation Level: Oriented to person Attention: Sustained Sustained Attention: Impaired Sustained Attention Impairment: Verbal basic       Comprehension  Auditory Comprehension Overall Auditory Comprehension: Impaired Yes/No Questions: Impaired Basic Biographical Questions: 76-100% accurate Basic Immediate Environment Questions: 50-74% accurate Commands: Impaired One Step Basic Commands: 75-100% accurate Interfering Components: Attention EffectiveTechniques: Extra processing time;Repetition    Expression Expression Primary Mode of Expression: Verbal Verbal Expression Overall Verbal Expression: Impaired Initiation: Impaired Automatic Speech: Social Response;Singing Level of Generative/Spontaneous Verbalization: Phrase (x1) Repetition: Impaired Level of Impairment: Word level Naming: Impairment Confrontation: Impaired Non-Verbal Means of Communication: Gestures   Oral / Motor  Oral Motor/Sensory Function Overall Oral Motor/Sensory Function: Within functional limits Motor Speech Overall Motor Speech:  (needs more assessment)   GO                    Mahala Menghini., M.A. CCC-SLP Acute Rehabilitation Services Pager [phone] Office [phone]  05/21/2020, 9:38 AM

## 2020-05-21 NOTE — Progress Notes (Signed)
Lower extremity venous bilateral study completed.   Please see CV Proc for preliminary results.   Dylan Monforte  

## 2020-05-21 NOTE — Progress Notes (Signed)
Assisted tele visit to patient with several  family members throughout day.  Vena Austria, RN

## 2020-05-21 NOTE — Evaluation (Signed)
Physical Therapy Evaluation Patient Details Name: Gail Montgomery MRN: [id number] DOB: 03/10/1996 Today's Date: 05/21/2020   History of Present Illness  24 yo who had acute R sided weakness and aphasia 05/19/20 due to LVO at [facility]. Screened Covid + but asymptomatic; tPA and underwent thrombectomy; during procedure had ICA vasospasm vs dissection.  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 Pt self extubated 05/20/20.   Clinical Impression  Co-session with OT with successful transition to EOB, standing and attempted side stepping with mod to max assist for mobility.  She has significantly impaired communication and will need intensive post acute rehab before d/c home with her mom's assist.   PT to follow acutely for deficits listed below.      Follow Up Recommendations CIR    Equipment Recommendations  Wheelchair (measurements PT);Wheelchair cushion (measurements PT);Hospital bed;3in1 (PT)    Recommendations for Other Services Rehab consult     Precautions / Restrictions Precautions Precautions: Fall Precaution Comments: right sided weakness (R leg most significantly)      Mobility  Bed Mobility Overal bed mobility: Needs Assistance Bed Mobility: Supine to Sit;Sit to Supine     Supine to sit: Mod assist;+2 for physical assistance Sit to supine: Mod assist;+2 for physical assistance   General bed mobility comments: multimodal cues to initiate then pt assisting with moving LLE adn reaching with LUE to mobilize to EOB; when returning to supine, pt indicating she was ready to lay down, then lifted LLE back onto bed and helped recline ; uncontrolled descent of trunk  Transfers Overall transfer level: Needs assistance Equipment used: 2 person hand held assist Transfers: Sit to/from Stand Sit to Stand: Mod assist;+2 physical assistance         General  transfer comment: Able to stand however, required total A to maintain R knee in extension due to buckling with attempting to side step to L  Ambulation/Gait                Stairs            Wheelchair Mobility    Modified Rankin (Stroke Patients Only)       Balance Overall balance assessment: Needs assistance   Sitting balance-Leahy Scale: Poor Sitting balance - Comments: requires A to maintain balcne EOB; at times leaning R   Standing balance support: Bilateral upper extremity supported Standing balance-Leahy Scale: Poor Standing balance comment: two person mod assist in standing.                              Pertinent Vitals/Pain Pain Assessment: Faces Faces Pain Scale: No hurt    Home Living Family/patient expects to be discharged to:: Private residence Living Arrangements: Alone (has a boyfriend) Available Help at Discharge: Family;Available 24 hours/day (mom plans to take her to mom's home in [place]) Type of Home: House Home Access: Level entry     Home Layout: Multi-level;Able to live on main level with bedroom/bathroom Home Equipment: None Additional Comments: plan is for pt to live with Mom after DC    Prior Function Level of Independence: Independent (student at [employer])         Comments: 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  I did not elicit or observe any active movement on this side.     Cervical / Trunk Assessment Cervical / Trunk Assessment: Other exceptions Cervical / Trunk Exceptions: R preference in bed adn R at times EOB  Communication   Communication: Receptive  difficulties;Expressive difficulties  Cognition Arousal/Alertness: Lethargic Behavior During Therapy: Flat affect;Restless Overall Cognitive Status: Impaired/Different from baseline Area of Impairment: Attention;Following commands;Problem solving                   Current Attention Level: Sustained (for very brief )   Following Commands: Follows one step commands with increased time     Problem Solving: Slow processing;Decreased initiation;Difficulty sequencing;Requires verbal cues;Requires tactile cues General Comments: delay in processing;       General Comments      Exercises     Assessment/Plan    PT Assessment Patient needs continued PT services  PT Problem List Decreased strength;Decreased activity tolerance;Decreased balance;Decreased mobility;Decreased cognition;Decreased knowledge of use of DME;Decreased safety awareness;Decreased knowledge of precautions       PT Treatment Interventions Gait training;DME instruction;Stair training;Functional mobility training;Therapeutic activities;Therapeutic exercise;Balance training;Neuromuscular re-education;Cognitive remediation;Patient/family education    PT Goals (Current goals can be found in the Care Plan section)  Acute Rehab PT Goals Patient Stated Goal: mother would like her to return home with mom at d/c PT Goal Formulation: With family Time For Goal Achievement: 06/04/20 Potential to Achieve Goals: Good    Frequency Min 4X/week   Barriers to discharge        Co-evaluation PT/OT/SLP Co-Evaluation/Treatment: Yes Reason for Co-Treatment: Complexity of the patient's impairments (multi-system involvement);For patient/therapist safety;Necessary to address cognition/behavior during functional activity;To address functional/ADL transfers PT goals addressed during session: Mobility/safety with mobility;Balance;Strengthening/ROM         AM-PAC PT "6 Clicks" Mobility  Outcome Measure Help needed turning from  your back to your side while in a flat bed without using bedrails?: A Lot Help needed moving from lying on your back to sitting on the side of a flat bed without using bedrails?: A Lot Help needed moving to and from a bed to a chair (including a wheelchair)?: Total Help needed standing up from a chair using your arms (e.g., wheelchair or bedside chair)?: A Lot Help needed to walk in hospital room?: Total Help needed climbing 3-5 steps with a railing? : Total 6 Click Score: 9    End of Session   Activity Tolerance: Patient limited by fatigue Patient left: in bed;with call bell/phone within reach;with bed alarm set;with nursing/sitter in room   PT Visit Diagnosis: Muscle weakness (generalized) (M62.81);Difficulty in walking, not elsewhere classified (R26.2);Hemiplegia and hemiparesis Hemiplegia - Right/Left: Right Hemiplegia - dominant/non-dominant: Dominant Hemiplegia - caused by: Cerebral infarction;Nontraumatic intracerebral hemorrhage    Time: 6283-6629 PT Time Calculation (min) (ACUTE ONLY): 46 min   Charges:   PT Evaluation $PT Eval Moderate Complexity: 1 Mod PT Treatments $Therapeutic Activity: 8-22 mins   Corinna Capra, PT, DPT  Acute Rehabilitation [phone] pager [phone]) [phone] office

## 2020-05-21 NOTE — Progress Notes (Signed)
OT Cancellation Note  Patient Details Name: Ellise Kovack MRN: [id number] DOB: 1996/04/14   Cancelled Treatment:    Reason Eval/Treat Not Completed: Active bedrest order  [facility] Izabellah Dadisman, OT/L   Acute OT Clinical Specialist Acute Rehabilitation Services Pager [phone] Office [phone]  05/21/2020, 8:49 AM

## 2020-05-21 NOTE — Progress Notes (Signed)
3% hypertonic saline decreased to 25cc/hr per physician's verbal order.

## 2020-05-21 NOTE — Progress Notes (Addendum)
STROKE TEAM PROGRESS NOTE   INTERVAL HISTORY No family is at the bedside. Pt is lying in bed. She self - extubated yesterday and so far tolerating well respiratory wise. Still has aphasia, nonverbal but following simple commands. Still has left gaze preference and right sided weakness. Speech following, able to have meds but not able to have diet yet. Na 154, now 3% down to @ 25cc. CT repeat showed stable hemorrhage but some worsening of hydrocephalus. D-dimer still high.   OBJECTIVE Vitals:   05/21/20 0700 05/21/20 0800 05/21/20 0900 05/21/20 1000  BP: 132/80 127/76 139/73 (!) 135/96  Pulse: 71 (!) 59 63 67  Resp: (!) 21 (!) 23 (!) 36 (!) 22  Temp:  99.4 F (37.4 C)    TempSrc:  Oral    SpO2: 100% 100% 100% 100%  Weight:      Height:        CBC:  Recent Labs  Lab 05/19/20 2232 05/20/20 0958  WBC 7.4 7.6  NEUTROABS 6.5 5.7  HGB 12.2 11.6*  HCT 37.9 36.3  MCV 88.3 89.9  PLT 288 273    Basic Metabolic Panel:  Recent Labs  Lab 05/20/20 0958 05/20/20 0958 05/20/20 1923 05/21/20 0057  NA 149*  149*   < > 154* 148*  K 3.3*  --   --  4.4  CL 117*  --   --  119*  CO2 22  --   --  19*  GLUCOSE 125*  --   --  115*  BUN 6  --   --  8  CREATININE 0.74  --   --  0.57  CALCIUM 8.4*  --   --  8.6*  MG 1.9  --   --  2.0  PHOS 3.6  --   --  3.2   < > = values in this interval not displayed.    Lipid Panel:     Component Value Date/Time   CHOL 156 05/20/2020 0958   TRIG 105 05/20/2020 0958   TRIG 104 05/20/2020 0958   HDL 57 05/20/2020 0958   CHOLHDL 2.7 05/20/2020 0958   VLDL 21 05/20/2020 0958   LDLCALC 78 05/20/2020 0958   HgbA1c:  Lab Results  Component Value Date   HGBA1C 5.6 05/20/2020   Urine Drug Screen:     Component Value Date/Time   LABOPIA NONE DETECTED 05/20/2020 1736   COCAINSCRNUR NONE DETECTED 05/20/2020 1736   LABBENZ NONE DETECTED 05/20/2020 1736   AMPHETMU NONE DETECTED 05/20/2020 1736   THCU POSITIVE (A) 05/20/2020 1736   LABBARB NONE  DETECTED 05/20/2020 1736    Alcohol Level No results found for: [facility]  IMAGING  CT Code Stroke CTA Head W/WO contrast CT Code Stroke CTA Neck W/WO contrast 05/19/2020 IMPRESSION:  1. 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  2. Mildly indistinct appearance of the proximal left ICA is likely from motion artifact, consider cervical run. No atheromatous changes or vasculopathy seen in the neck.   CT HEAD CODE STROKE WO CONTRAST 05/19/2020 IMPRESSION:  Dense left ICA terminus and MCA. No acute hemorrhage. ASPECTS is 10.   CT HEAD WO CONTRAST 05/20/2020 IMPRESSION:  1. Subarachnoid hemorrhage along the anterior interhemispheric fissure with slightly increased extension into both lateral ventricles.  2. Slight increase in size of the temporal horn of the left lateral ventricle consistent with early communicating hydrocephalus.   CT HEAD WO CONTRAST 05/19/2020 IMPRESSION:  New large volume of subarachnoid and intraventricular  hemorrhage.  MR BRAIN WO CONTRAST 05/20/2020 IMPRESSION:  1. Early subacute infarct of the left basal ganglia involving the posterior putamen and caudate body.  2. Subarachnoid hemorrhage along the interhemispheric fissure extending into the lateral ventricles.  3. 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   DG CHEST PORT 1 VIEW 05/19/2020 IMPRESSION:  1. Endotracheal tube and NG tube placement.  2. Mildly low volume film with mild bibasilar opacities which may represent atelectasis or infection/pneumonia.   Neuro Interventional Radiology - Cerebral Angiogram with Intervention - Dr Loreta Ave 05/19/20 2:27 PM Left MCA:  Baseline TICI 0 First Pass Device:                 Local aspiration and solitaire 4 x 40             Result                         TICI 0 Second Pass Device:           Local aspiration and solitaire 4 x 40             Result                         TICI  3 Left ACA/pericallosal Baseline TICI 0 Final TICI 0 pericallosal artery Findings:                                Patent right CFA Left M1 occlusion, final is TICI 3 Left ACA A2 occlusion migrated to pericallosal artery during the case.  Could not complete a first pass safely.  Significant irregularity of the cervical ICA, concerning for dissection, that resolved after observation, and once DAPT was initiated.  The final appearance may represent spasm vs FMD/vasculitis  Transthoracic Echocardiogram  1. Left ventricular ejection fraction, by estimation, is 60 to 65%. The  left ventricle has normal function. The left ventricle has no regional  wall motion abnormalities. Left ventricular diastolic parameters were  normal.  2. Right ventricular systolic function is normal. The right ventricular  size is normal.  3. The mitral valve is normal in structure. Trivial mitral valve  regurgitation. No evidence of mitral stenosis.  4. The aortic valve is normal in structure. Aortic valve regurgitation is  not visualized. No aortic stenosis is present.  5. The inferior vena cava is normal in size with greater than 50%  respiratory variability, suggesting right atrial pressure of 3 mmHg.   Bilateral Lower Extremity Venous Dopplers - pending  PHYSICAL EXAM  Temp:  [98 F (36.7 C)-99.4 F (37.4 C)] 99.4 F (37.4 C) (08/23 0800) Pulse Rate:  [54-74] 67 (08/23 1000) Resp:  [15-36] 22 (08/23 1000) BP: (107-139)/(57-96) 135/96 (08/23 1000) SpO2:  [100 %] 100 % (08/23 1000)  General - Well nourished, well developed, lethargic, not in acute distress.  Ophthalmologic - fundi not visualized due to noncooperation.   Cardiovascular - irregular rhythm, on tele showed sinus arrythmia.  Neuro - lethargic, able to open eyes on voice but needed repetitive stimulation to keep eyes open. Nonverbal, not able to name or repeat, but able to follow simple commands on the left. Left gaze preference, barely  cross midline. PERRL. Blinking to visual threat bilaterally. No significant facial droop. Tongue protrusion not cooperative. LUE and LLE spontaneous movement. RUE proximal 0/5, distal  finger grip 2-3/5. LLE strong withdraw to pain, RLE slight withdraw to pain. Sensation, coordination not cooperative and gait not tested.   ASSESSMENT/PLAN Gail Montgomery is a 24 y.o. female with history of tobacco use, anxiety (panic attacks) and hormonal birth control presented with right-sided weakness and aphasia that started abruptly at 9:30 AM.  COVID - positive. IV t-PA - Saturday 05/19/20 at 1145. Thrombectomy - Dr Loreta Ave - Left M1 occlusion, final is TICI 3.   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 Paradoxical emboli vs. COVID hypercoagulability in the setting of OCP use   CT Head - Dense left ICA terminus and MCA. No acute hemorrhage. ASPECTS is 10.      CTA H&N - Emergent large vessel occlusion at the left ICA terminus continuing into the MCA. Mildly indistinct appearance of the proximal left ICA is likely from motion artifact, consider cervical run. No atheromatous changes or vasculopathy seen in the neck.  IR - left MCA and A2 occlusion s/p TICI3 of MCA and TICI0 of ACA. Initially left ICA stenosis post IR but resolved on the final run  CT head 8/21 - New large volume of subarachnoid and intraventricular hemorrhage.   CT head 8/22 [facility] along the anterior interhemispheric fissure with slightly increased extension into both lateral ventricles. Slight increase in size of the temporal horn of the left lateral ventricle consistent with early communicating hydrocephalus.   MRI head -  Early subacute infarct of the left basal ganglia involving the posterior putamen and caudate body.  LE Venous Dopplers - no DVT  2D Echo EF  60-65%  Will do TCD bubble study once more cooperative  [employer] Virus 2 - positive  LDL - 78  HgbA1c 5.6  Hypercoagulable and autoimmune labs pending  UDS - THC positive  VTE prophylaxis - SCDs  No antithrombotic prior to admission, now on No antithrombotic due to [facility] and IVH  Ongoing aggressive stroke risk factor management  Therapy recommendations:  pending  Disposition:  Pending  Hydrocephalus  CT head showed mildly increased size of left temporal horn  CT repeat stable hemorrhage but mild progression of hydrocephalus  NSG on board  3% hypertonic saline - 50 cc/hr ->25cc  Na goal 145-155  Na check Q6  Na - 139->149->154->148  BP management  Stable not on IV BP meds  On Nimodipine for SAH  BP goal < 160 given SAH and IVH  Close BP monitoring . Long-term BP goal normotensive  Hyperlipidemia  Home Lipid lowering medication: none   LDL 78, goal < 70  No statin at this time due to [facility] and IVH  Continue statin at discharge  ? Hypercoagulable state   COVID positive - asymptomatic  D-dimer > 20->16.13->17.44  OCP user with smoking and THC use  COVID infection  Asymptomatic  Ferritin 24->30->37  Fibrinogen 289  CRP 0.8->2.4->3.3  D-dimer > 20->16.13->17.44  Tobacco abuse  Intermittent smoking per mom  Smoking cessation counseling will be provided  Other Stroke Risk Factors  ETOH use, advised to drink no more than 1 alcoholic beverage per day  Obesity, Body mass index is 31.86 kg/m., recommend weight loss, diet and exercise as appropriate   Hormonal birth control - both progesterone and estrogen - Estarylla  Other Active Problems  Code status - Full  code  Hospital day # 2  This patient is critically ill due to left MCA stroke with ICA/MCA occlusion s/p thrombectomy and tPA, SAH and IVH post op, hydrocephalus, aphasia, dysphagia, COVID infection and at significant risk of neurological worsening, death form recurrent  stroke, hemorrhage expansion, obstructive hydrocephalus, seizure, aspiration. This patient's care requires constant monitoring of vital signs, hemodynamics, respiratory and cardiac monitoring, review of multiple databases, neurological assessment, discussion with family, other specialists and medical decision making of high complexity. I spent 35 minutes of neurocritical care time in the care of this patient. I had long discussion with mom over the phone, updated pt current condition, treatment plan and potential prognosis, and answered all the questions. She expressed understanding and appreciation.    Marvel PlanJindong Elianna Windom, MD PhD Stroke Neurology 05/21/2020 10:38 AM   To contact Stroke Continuity provider, please refer to [email or web address]. After hours, contact General Neurology

## 2020-05-22 ENCOUNTER — Inpatient Hospital Stay (HOSPITAL_COMMUNITY): Payer: BC Managed Care – PPO

## 2020-05-22 DIAGNOSIS — D72829 Elevated white blood cell count, unspecified: Secondary | ICD-10-CM

## 2020-05-22 LAB — COMPREHENSIVE METABOLIC PANEL
ALT: 13 U/L (ref 0–44)
AST: 17 U/L (ref 15–41)
Albumin: 2.7 g/dL — ABNORMAL LOW (ref 3.5–5.0)
Alkaline Phosphatase: 48 U/L (ref 38–126)
Anion gap: 12 (ref 5–15)
BUN: 6 mg/dL (ref 6–20)
CO2: 22 mmol/L (ref 22–32)
Calcium: 8.8 mg/dL — ABNORMAL LOW (ref 8.9–10.3)
Chloride: 116 mmol/L — ABNORMAL HIGH (ref 98–111)
Creatinine, Ser: 0.54 mg/dL (ref 0.44–1.00)
GFR calc Af Amer: >60 mL/min (ref >60)
GFR calc non Af Amer: >60 mL/min (ref >60)
Glucose, Bld: 127 mg/dL — ABNORMAL HIGH (ref 70–99)
Potassium: 3.2 mmol/L — ABNORMAL LOW (ref 3.5–5.1)
Sodium: 150 mmol/L — ABNORMAL HIGH (ref 135–145)
Total Bilirubin: 0.3 mg/dL (ref 0.3–1.2)
Total Protein: 7.3 g/dL (ref 6.5–8.1)

## 2020-05-22 LAB — CBC WITH DIFFERENTIAL/PLATELET
Abs Immature Granulocytes: 0.06 10*3/uL (ref 0.00–0.07)
Basophils Absolute: 0 10*3/uL (ref 0.0–0.1)
Basophils Relative: 0 %
Eosinophils Absolute: 0 10*3/uL (ref 0.0–0.5)
Eosinophils Relative: 0 %
HCT: 33.5 % — ABNORMAL LOW (ref 36.0–46.0)
Hemoglobin: 10.4 g/dL — ABNORMAL LOW (ref 12.0–15.0)
Immature Granulocytes: 1 %
Lymphocytes Relative: 15 %
Lymphs Abs: 1.8 10*3/uL (ref 0.7–4.0)
MCH: 28.5 pg (ref 26.0–34.0)
MCHC: 31 g/dL (ref 30.0–36.0)
MCV: 91.8 fL (ref 80.0–100.0)
Monocytes Absolute: 1.5 10*3/uL — ABNORMAL HIGH (ref 0.1–1.0)
Monocytes Relative: 12 %
Neutro Abs: 8.9 10*3/uL — ABNORMAL HIGH (ref 1.7–7.7)
Neutrophils Relative %: 72 %
Platelets: 209 10*3/uL (ref 150–400)
RBC: 3.65 MIL/uL — ABNORMAL LOW (ref 3.87–5.11)
RDW: 13.2 % (ref 11.5–15.5)
WBC: 12.2 10*3/uL — ABNORMAL HIGH (ref 4.0–10.5)
nRBC: 0 % (ref 0.0–0.2)

## 2020-05-22 LAB — T4, FREE: Free T4: 0.94 ng/dL (ref 0.61–1.12)

## 2020-05-22 LAB — URINALYSIS, COMPLETE (UACMP) WITH MICROSCOPIC
Bilirubin Urine: NEGATIVE
Glucose, UA: NEGATIVE mg/dL
Ketones, ur: NEGATIVE mg/dL
Nitrite: NEGATIVE
Protein, ur: NEGATIVE mg/dL
Specific Gravity, Urine: 1.028 (ref 1.005–1.030)
pH: 6 (ref 5.0–8.0)

## 2020-05-22 LAB — TSH
TSH: 0.054 u[IU]/mL — ABNORMAL LOW (ref 0.350–4.500)
TSH: 0.05 u[IU]/mL — ABNORMAL LOW (ref 0.350–4.500)

## 2020-05-22 LAB — VITAMIN B12: Vitamin B-12: 300 pg/mL (ref 180–914)

## 2020-05-22 LAB — FERRITIN: Ferritin: 51 ng/mL (ref 11–307)

## 2020-05-22 LAB — RPR: RPR Ser Ql: NONREACTIVE

## 2020-05-22 LAB — MAGNESIUM: Magnesium: 1.8 mg/dL (ref 1.7–2.4)

## 2020-05-22 LAB — C-REACTIVE PROTEIN: CRP: 3.9 mg/dL — ABNORMAL HIGH (ref <1.0)

## 2020-05-22 LAB — SODIUM: Sodium: 152 mmol/L — ABNORMAL HIGH (ref 135–145)

## 2020-05-22 LAB — D-DIMER, QUANTITATIVE: D-Dimer, Quant: >20 ug/mL-FEU — ABNORMAL HIGH (ref 0.00–0.50)

## 2020-05-22 LAB — PHOSPHORUS: Phosphorus: 3.2 mg/dL (ref 2.5–4.6)

## 2020-05-22 MED ORDER — FOLIC ACID 1 MG PO TABS
1.0000 mg | ORAL_TABLET | Freq: Every day | ORAL | Status: DC
  Administered 2020-05-22 – 2020-06-07 (×17): 1 mg via ORAL
  Filled 2020-05-22 (×17): qty 1

## 2020-05-22 MED ORDER — VITAMIN B-12 1000 MCG PO TABS
500.0000 ug | ORAL_TABLET | Freq: Every day | ORAL | Status: DC
  Administered 2020-05-22 – 2020-06-07 (×17): 500 ug via ORAL
  Filled 2020-05-22 (×5): qty 1
  Filled 2020-05-22: qty 5
  Filled 2020-05-22 (×12): qty 1

## 2020-05-22 MED ORDER — POTASSIUM CHLORIDE 20 MEQ PO PACK
40.0000 meq | PACK | ORAL | Status: AC
  Administered 2020-05-22 (×2): 40 meq via ORAL
  Filled 2020-05-22 (×2): qty 2

## 2020-05-22 MED ORDER — PROSIGHT PO TABS
1.0000 | ORAL_TABLET | Freq: Every day | ORAL | Status: DC
  Administered 2020-05-23 – 2020-06-07 (×16): 1 via ORAL
  Filled 2020-05-22 (×17): qty 1

## 2020-05-22 MED ORDER — PANTOPRAZOLE SODIUM 40 MG PO TBEC
40.0000 mg | DELAYED_RELEASE_TABLET | Freq: Every day | ORAL | Status: DC
  Administered 2020-05-22 – 2020-06-07 (×16): 40 mg via ORAL
  Filled 2020-05-22 (×17): qty 1

## 2020-05-22 MED ORDER — THIAMINE HCL 100 MG PO TABS
100.0000 mg | ORAL_TABLET | Freq: Every day | ORAL | Status: DC
  Administered 2020-05-22 – 2020-06-07 (×17): 100 mg via ORAL
  Filled 2020-05-22 (×16): qty 1

## 2020-05-22 NOTE — Progress Notes (Signed)
Modified Barium Swallow Progress Note  Patient Details  Name: Gail Montgomery MRN: [id number] Date of Birth: Jan 17, 1996  Today's Date: 05/22/2020  Modified Barium Swallow completed.  Full report located under Chart Review in the Imaging Section.  Brief recommendations include the following:  Clinical Impression   Pt presents with a mild oral dysphagia, but with relatively intact pharyngeal function. She has reduced labial seal with anterior spillage that happens with thin liquids via cup and straw, and is not necessarily localized to one side of her mouth. She has lingual pumping, with more time needed to clear solids from her oral cavity compared to liquids. Lingual pumping with solids is sometimes moderately prolonged, and she is not able to clear the barium tablet before losing it sublingually. After she is able to retrieve it, she masticated it rather than trying again to swallow it whole. There is some premature spillage with thin liquids, at times filling the valleculae and pyriform sinuses and with varying amounts of time that passes before she swallows, dependent upon how much longer it takes her to clear her mouth. Despite this, there is no aspiration across challenging. She had a few instances of trace and transient penetration. Throat clearing was not noted with each episode of transient penetration, but was also not observed outside of these episodes. Recommend that pt begin Dys 3 (mechanical soft) diet and thin liquids.    Swallow Evaluation Recommendations       SLP Diet Recommendations: Dysphagia 3 (Mech soft) solids;Thin liquid   Liquid Administration via: Straw;Cup   Medication Administration: Whole meds with puree   Supervision: Staff to assist with self feeding   Compensations: Slow rate;Small sips/bites   Postural Changes: Seated upright at 90 degrees   Oral Care Recommendations: Oral care BID        Mahala Menghini., M.A. CCC-SLP Acute Rehabilitation Services Pager  [phone] Office [phone]  05/22/2020,3:20 PM

## 2020-05-22 NOTE — Progress Notes (Signed)
STROKE TEAM PROGRESS NOTE   INTERVAL HISTORY No family or RN at bedside. Pt lying in bed, lethargic, nonverbal. However, right UE and LE muscle strength improved from yesterday. Na 150, on 3% @ 10cc overnight, will d/c today and change to NS. Repeat head CT showed no progression of hydrocephalus and some regression of IVH.    OBJECTIVE Vitals:   05/22/20 0600 05/22/20 0700 05/22/20 0800 05/22/20 0900  BP: 134/76 134/73 128/75 (!) 143/81  Pulse: (!) 57 78 (!) 102 79  Resp: (!) 25 18 (!) 23 (!) 23  Temp:      TempSrc:      SpO2: 100% 100% 100% 100%  Weight:      Height:       CBC:  Recent Labs  Lab 05/21/20 1220 05/22/20 0356  WBC 10.9* 12.2*  NEUTROABS 8.3* 8.9*  HGB 11.2* 10.4*  HCT 35.4* 33.5*  MCV 91.2 91.8  PLT 243 209   Basic Metabolic Panel:  Recent Labs  Lab 05/21/20 0057 05/21/20 1220 05/21/20 2334 05/22/20 0356  NA 148*   < > 152* 150*  K 4.4  --   --  3.2*  CL 119*  --   --  116*  CO2 19*  --   --  22  GLUCOSE 115*  --   --  127*  BUN 8  --   --  6  CREATININE 0.57  --   --  0.54  CALCIUM 8.6*  --   --  8.8*  MG 2.0  --   --  1.8  PHOS 3.2  --   --  3.2   < > = values in this interval not displayed.   Lipid Panel:     Component Value Date/Time   CHOL 156 05/20/2020 0958   TRIG 105 05/20/2020 0958   TRIG 104 05/20/2020 0958   HDL 57 05/20/2020 0958   CHOLHDL 2.7 05/20/2020 0958   VLDL 21 05/20/2020 0958   LDLCALC 78 05/20/2020 0958   HgbA1c:  Lab Results  Component Value Date   HGBA1C 5.6 05/20/2020   Urine Drug Screen:     Component Value Date/Time   LABOPIA NONE DETECTED 05/20/2020 1736   COCAINSCRNUR NONE DETECTED 05/20/2020 1736   LABBENZ NONE DETECTED 05/20/2020 1736   AMPHETMU NONE DETECTED 05/20/2020 1736   THCU POSITIVE (A) 05/20/2020 1736   LABBARB NONE DETECTED 05/20/2020 1736    Alcohol Level No results found for: [facility]  IMAGING CT HEAD CODE STROKE WO CONTRAST 05/19/2020 Dense left ICA terminus and MCA. No acute  hemorrhage. ASPECTS is 10.   CT Code Stroke CTA Head W/WO contrast CT Code Stroke CTA Neck W/WO contrast 05/19/2020 1. 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  2. Mildly indistinct appearance of the proximal left ICA is likely from motion artifact, consider cervical run. No atheromatous changes or vasculopathy seen in the neck.   Neuro Interventional Radiology - Cerebral Angiogram with Intervention - Dr Loreta Ave 05/19/20 2:27 PM Left MCA: Baseline TICI 0 First Pass Device:                 Local aspiration and solitaire 4 x 40              Result                         TICI 0 Second Pass Device:  Local aspiration and solitaire 4 x 40              Result                         TICI 3 Left ACA/pericallosal  Baseline TICI 0,    Final TICI 0 pericallosal artery Findings:  Patent right CFA Left M1 occlusion, final is TICI 3  Left ACA A2 occlusion migrated to pericallosal artery during the case.  Could not complete a first pass safely.  Significant irregularity of the cervical ICA, concerning for dissection, that resolved after observation, and once DAPT was initiated.  The final appearance may represent spasm vs FMD/vasculitis  CT HEAD WO CONTRAST 05/19/2020 New large volume of subarachnoid and intraventricular hemorrhage.  CT HEAD WO CONTRAST 05/20/2020 1. Subarachnoid hemorrhage along the anterior interhemispheric fissure with slightly increased extension into both lateral ventricles.  2. Slight increase in size of the temporal horn of the left lateral ventricle consistent with early communicating hydrocephalus.   MR BRAIN WO CONTRAST 05/20/2020 1. Early subacute infarct of the left basal ganglia involving the posterior putamen and caudate body.  2. Subarachnoid hemorrhage along the interhemispheric fissure extending into the lateral ventricles.  3. Early communicating hydrocephalus as evidenced by slightly increased size of the temporal  horns.   CT HEAD WO CONTRAST 05/21/2020 1. Stable appearance of left basal ganglia nonhemorrhagic infarction. 2. Stable medial left frontal extra-axial hemorrhage, extending into the ventricles bilaterally. 3. Subarachnoid hemorrhage posteriorly on the left and along the inter cerebral hemisphere likely represents redistribution without significant new hemorrhage. 4. Hemorrhage is present within the aqueduct of Sylvius. 5. Progressive dilation of the ventricles compatible with developing hydrocephalus. 6. No new areas of hemorrhage.   CT HEAD WO CONTRAST 05/22/2020 1. Regressed intraventricular hemorrhage since yesterday with small volume residual. Stable mild ventriculomegaly, and possible mild transependymal edema. 2. Stable left inferior frontal gyrus parasagittal hematoma (approximately 12 mL) and regional edema. No significant midline shift, basilar cisterns are patent. 3. Stable left basal ganglia infarcts. 4. No new intracranial abnormality.   Transthoracic Echocardiogram  1. Left ventricular ejection fraction, by estimation, is 60 to 65%. The left ventricle has normal function. The left ventricle has no regional wall motion abnormalities. Left ventricular diastolic parameters were normal.  2. Right ventricular systolic function is normal. The right ventricular size is normal.  3. The mitral valve is normal in structure. Trivial mitral valve regurgitation. No evidence of mitral stenosis.  4. The aortic valve is normal in structure. Aortic valve regurgitation is not visualized. No aortic stenosis is present.  5. The inferior vena cava is normal in size with greater than 50% respiratory variability, suggesting right atrial pressure of 3 mmHg.   ECHOCARDIOGRAM COMPLETE 05/20/2020 1. Left ventricular ejection fraction, by estimation, is 60 to 65%. The left ventricle has normal function. The left ventricle has no regional wall motion abnormalities. Left ventricular diastolic parameters were  normal.   2. Right ventricular systolic function is normal. The right ventricular size is normal.   3. The mitral valve is normal in structure. Trivial mitral valve regurgitation. No evidence of mitral stenosis.   4. The aortic valve is normal in structure. Aortic valve regurgitation is not visualized. No aortic stenosis is present.   5. The inferior vena cava is normal in size with greater than 50% respiratory variability, suggesting right atrial pressure of 3 mmHg.   Bilateral Lower Extremity Venous Dopplers  05/21/2020 RIGHT: -  There is no evidence of deep vein thrombosis in the lower extremity. However, portions of this examination were limited- see technologist comments above.  - No cystic structure found in the popliteal fossa.   LEFT: - There is no evidence of deep vein thrombosis in the lower extremity. However, portions of this examination were limited- see technologist comments above.  - No cystic structure found in the popliteal fossa.     PHYSICAL EXAM  Temp:  [97.8 F (36.6 C)-100 F (37.8 C)] 99.2 F (37.3 C) (08/24 0400) Pulse Rate:  [54-102] 79 (08/24 0900) Resp:  [16-25] 23 (08/24 0900) BP: (110-150)/(61-130) 143/81 (08/24 0900) SpO2:  [94 %-100 %] 100 % (08/24 0900)  General - Well nourished, well developed, lethargic.  Ophthalmologic - fundi not visualized due to noncooperation.   Cardiovascular - irregular rhythm, on tele showed sinus arrythmia.  Neuro - lethargic, able to open eyes on voice but needed repetitive stimulation to keep eyes open. Nonverbal, not able to name or repeat, did not follow simple commands today. Left gaze preference, but able to cross midline, right gaze incomplete. PERRL. Blinking to visual threat bilaterally. Mild right facial droop. Tongue protrusion not cooperative. LUE and LLE spontaneous movement. RUE proximal 3-/5, distal finger grip 3/5. LLE strong withdraw to pain, RLE mild withdraw to pain. Sensation, coordination not cooperative and  gait not tested.   ASSESSMENT/PLAN Ms. Gail Montgomery is a 24 y.o. female with history of tobacco use, anxiety (panic attacks) and hormonal birth control presented with right-sided weakness and aphasia that started abruptly at 9:30 AM.  COVID - positive. IV t-PA - Saturday 05/19/20 at 1145. Thrombectomy - Dr Loreta AveWagner - Left M1 occlusion, final is TICI 3.   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 Paradoxical emboli vs. COVID hypercoagulability in the setting of OCP use  CT Head - Dense left ICA terminus and MCA. No acute hemorrhage. ASPECTS is 10.      CTA H&N - Emergent large vessel occlusion at the left ICA terminus continuing into the MCA. Mildly indistinct appearance of the proximal left ICA is likely from motion artifact, consider cervical run. No atheromatous changes or vasculopathy seen in the neck.  IR - left MCA and A2 occlusion s/p TICI3 of MCA and TICI0 of ACA. Initially left ICA stenosis post IR but resolved on the final run  CT head 8/21 - New large volume of subarachnoid and intraventricular hemorrhage.   CT head 8/22 [facility]- SAH along the anterior interhemispheric fissure with slightly increased extension into both lateral ventricles. Slight increase in size of the temporal horn of the left lateral ventricle consistent with early communicating hydrocephalus.   MRI head 8/22 -  Early subacute infarct of the left basal ganglia involving the posterior putamen and caudate body.  CT head 8/23 - stable L basal ganglia infarct, ICH/IVH/SAH. Progressive dilation of ventricles. CT head 8/24 - regressed IVH. Stable infarct and frontal ICH. Stable ventriculomegaly LE Venous Dopplers - no DVT  2D Echo EF 60-65%  Will do TCD bubble study once more cooperative  [employer] Corona Virus 2 - positive  LDL - 78  HgbA1c  5.6  Hypercoagulable and autoimmune labs pending  UDS - THC positive  VTE prophylaxis - SCDs  No antithrombotic prior to admission, now on [facility]  [facility]  Ongoing aggressive stroke risk factor management  Therapy recommendations:  CIR (CIR cannot take until >20d from positive test or 2 negative tests)  Disposition:  Pending (will be difficult to place a 24yo in SNF for rehab)  Hydrocephalus, mild  CT head showed mildly increased size of left temporal horn  CT repeat stable hemorrhage but mild progression of hydrocephalus  CT head 8/24 - regressed IVH. Stable infarct and frontal ICH. Stable mild ventriculomegaly  NSG on board  3% hypertonic saline - 50 cc/hr ->25cc->10cc -> off  Na - 139->149->154->148->152->150  PICC placed 8/23   Repeat CT on 8/26  BP management  Stable not on IV BP meds  On Nimodipine for [facility] - discussed with Dr. Conchita Paris, will keep nimodipine while inpt - d/c on discharge if neuro stable  BP goal < 160 given SAH and IVH  Close BP monitoring . Long-term BP goal normotensive  Hyperlipidemia  Home Lipid lowering medication: none   LDL 78, goal < 70  No statin at this time due to [facility] and IVH  Consider low dose statin at discharge  ? Hypercoagulable state   COVID positive - asymptomatic  D-dimer > 20->16.13->17.44-> >20  OCP user with smoking and THC use  COVID - 19 infection  Asymptomatic  Ferritin 24->30->37->51  Fibrinogen 289  CRP 0.8->2.4->3.3->3.9  D-dimer > 20->16.13->17.44-> >20  Leukocytosis  Etiology unknown  WBC 10.9->12.2   Check UA  CXR neg  Euthyroid sick syndrome  Low TSH 0.054   normal free T4.   T3 pending  Likely related to critical illness  Tobacco abuse  Intermittent smoking per mom  Smoking cessation counseling will be provided  Other Stroke Risk Factors  ETOH use, advised to drink no more than 1 alcoholic beverage per day  Obesity, Body mass index is  31.86 kg/m., recommend weight loss, diet and exercise as appropriate   Hormonal birth control - both progesterone and estrogen - Estarylla  Other Active Problems  Code status - Full code  Mild anemia 11.2->10.4  Hypokalemia 3.3->4.4->3.2 - supplement  Low normal B12 level 300 - supplement  Hospital day # 3  This patient is critically ill due to left MCA infarct, left ICA occlusion MCA and ACA occlusion, SAH and IVH post op, hydrocephalus, COVID infection, low TSH and at significant risk of neurological worsening, death form recurrent stroke, brain herniation, cerebral edema, obstructive hydrocephalus, COVID pneumonia, seizure, hematoma expansion. This patient's care requires constant monitoring of vital signs, hemodynamics, respiratory and cardiac monitoring, review of multiple databases, neurological assessment, discussion with family, other specialists and medical decision making of high complexity. I spent 40 minutes of neurocritical care time in the care of this patient. I had long discussion with mom at bedside, updated pt current condition, treatment plan and potential prognosis, and answered all the questions. She expressed understanding and appreciation.    Marvel Plan, MD PhD Stroke Neurology 05/22/2020 10:12 AM   To contact Stroke Continuity provider, please refer to [email or web address]. After hours, contact General Neurology

## 2020-05-22 NOTE — Progress Notes (Signed)
Assisted tele visit to patient with family member.  Gail Elden R, RN  

## 2020-05-22 NOTE — Progress Notes (Signed)
Assisted tele visit to patient with family member.  Zafirah Vanzee R, RN  

## 2020-05-22 NOTE — Progress Notes (Signed)
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 Loreta AveWagner. Patient laying in bed resting comfortably. She opens eyes to voice and follows some simple commands. Able to phonate some syllables but otherwise aphasic. Moving left side, right hand grip intact but weaker compared to left, no movements of RLE. Right femoral puncture site c/d/i.  CT head this AM: 1. Regressed intraventricular hemorrhage since yesterday with small volume residual. Stable mild ventriculomegaly, and possible mild transependymal edema. 2. Stable left inferior frontal gyrus parasagittal hematoma (approximately 12 mL) and regional edema. No significant midline shift, basilar cisterns are patent. 3. Stable left basal ganglia infarcts. 4. No new intracranial abnormality.   Allergies: Other  Medications: Prior to Admission medications   Medication Sig Start Date End Date Taking? Authorizing Provider  Calcium-Magnesium-Zinc (CAL-MAG-ZINC PO) Take 1 tablet by mouth daily with breakfast.   Yes [provider]  ESTARYLLA 0.25-35 MG-MCG tablet Take 1 tablet by mouth daily. 05/17/20  Yes [provider]  ibuprofen (ADVIL) 200 MG tablet Take 200-400 mg by mouth every 6 (six) hours as needed for headache (or cramps).    Yes [provider]     Vital Signs: BP (!) 143/81   Pulse 79   Temp 99.2 F (37.3 C) (Oral)   Resp (!) 23   Ht 5\' 4"  (1.626 m)   Wt 185 lb 10 oz (84.2 kg)   SpO2 100%   BMI 31.86  kg/m   Physical Exam Vitals and nursing note reviewed.  Constitutional:      General: She is not in acute distress. Pulmonary:     Effort: Pulmonary effort is normal. No respiratory distress.  Skin:    General: Skin is warm and dry.     Comments: Right femoral puncture site soft without active bleeding or hematoma.  Neurological:     Comments: Alert and follows some simple commands (moves extremities to command, smiles on command). Able to phonate some syllables but otherwise aphasic. PERRL bilaterally. No facial asymmetry. Unable to protrude tongue on command. Moving left side, right hand grip intact but weaker compared to left, no movements of RLE. Distal pulses (DPs) 1+ bilaterally.     Imaging: CT Code Stroke CTA Head W/WO contrast  Result Date: 05/19/2020 CLINICAL DATA:  Stroke suspected. EXAM: CT ANGIOGRAPHY HEAD AND NECK TECHNIQUE: Multidetector CT imaging of the head and neck was performed using the standard protocol during bolus administration of intravenous contrast. Multiplanar CT image reconstructions and MIPs were obtained to evaluate the vascular anatomy. Carotid stenosis measurements (when applicable) are obtained utilizing NASCET criteria, using the distal internal carotid diameter as the denominator. CONTRAST:  Dose is currently not known COMPARISON:  Head CT reported separately FINDINGS: CTA NECK FINDINGS Aortic arch: Negative.  Three vessel branching Right carotid system: No atheromatous changes, dissection, or beading. Left carotid system: Mild motion artifact which could account for the mildly smudged appearance of the left ICA proximally. Vertebral arteries: No proximal subclavian stenosis. Codominant vertebral arteries that are smooth and widely patent to the dura. Skeleton: No acute or aggressive finding Other neck: Negative Upper  chest: Negative Review of the MIP images confirms the above findings CTA HEAD FINDINGS Anterior circulation: Cut off at the left ICA  terminus with visible luminal clot. There is continuation of non opacification throughout the left M1 segment which is clot by CT. Non opacification at the left A1 origin. No contralateral branch occlusion is seen. Intravascular enhancement seen in the left operculum branches on the delayed phase. Posterior circulation: The vertebral and basilar arteries are smooth and widely patent. No branch occlusion, beading, or aneurysm. Venous sinuses: Patent Anatomic variants: None significant Review of the MIP images confirms the above findings These results were called by telephone at the time of interpretation on 05/19/2020 at 11:35 am to provider MCNEILL [facility] , who is already aware. IMPRESSION: 1. 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 2. Mildly indistinct appearance of the proximal left ICA is likely from motion artifact, consider cervical run. No atheromatous changes or vasculopathy seen in the neck. Electronically Signed   By: Marnee Spring M.D.   On: 05/19/2020 11:40   CT HEAD WO CONTRAST  Result Date: 05/22/2020 CLINICAL DATA:  24 year old female status post code stroke presentation on 05/19/2020 with left ICA terminus ELVO. Status post endovascular reperfusion, subarachnoid and intra-axial hemorrhage. EXAM: CT HEAD WITHOUT CONTRAST TECHNIQUE: Contiguous axial images were obtained from the base of the skull through the vertex without intravenous contrast. COMPARISON:  Brain MRI 05/20/2020 and earlier. Head CT 05/21/2020 and earlier. FINDINGS: Brain: Decreased intraventricular hemorrhage since yesterday with small volume residual. There is mild ventriculomegaly, possible mild transependymal edema. Left inferior frontal gyrus parasagittal blood which appears to be both intra-and extra-axial encompasses 48 by 19 x 26 mm (AP by transverse by CC), and is stable since yesterday (estimated total volume 12 mL). Associated edema in the medial left inferior frontal gyrus,  gyrus rectus and cingulate is stable. Stable mild regional mass effect. No midline shift. Basilar cisterns remain patent. Trace subarachnoid hemorrhage elsewhere. Cytotoxic edema redemonstrated in the left basal ganglia and stable. Stable gray-white matter differentiation throughout the brain. Vascular: No suspicious intracranial vascular hyperdensity now. Skull: No acute osseous abnormality identified. Sinuses/Orbits: Stable fluid levels in the sphenoid sinuses. Other paranasal sinuses and mastoids are stable and well pneumatized. Other: No acute orbit or scalp soft tissue finding. IMPRESSION: 1. Regressed intraventricular hemorrhage since yesterday with small volume residual. Stable mild ventriculomegaly, and possible mild transependymal edema. 2. Stable left inferior frontal gyrus parasagittal hematoma (approximately 12 mL) and regional edema. No significant midline shift, basilar cisterns are patent. 3. Stable left basal ganglia infarcts. 4. No new intracranial abnormality. Electronically Signed   By: Odessa Fleming M.D.   On: 05/22/2020 08:40   CT HEAD WO CONTRAST  Result Date: 05/21/2020 CLINICAL DATA:  Stroke, follow-up. EXAM: CT HEAD WITHOUT CONTRAST TECHNIQUE: Contiguous axial images were obtained from the base of the skull through the vertex without intravenous contrast. COMPARISON:  CT head without contrast 05/19/2020 and 05/20/2020. MR head without contrast 05/20/2020. FINDINGS: Brain: Left basal ganglia nonhemorrhagic infarction is again noted. Predominantly extra-axial medial left frontal hemorrhage is again noted, extending into the ventricles bilaterally. No significant change in hemorrhage volume is present. Ventricles are further dilated. No new areas of hemorrhage are present. Additional subarachnoid hemorrhage is now seen posteriorly on the left and along the inter cerebral hemisphere. Hemorrhage is present within the aqueduct of Sylvius. Vascular: No hyperdense vessel or unexpected calcification.  Skull: Calvarium is intact. No focal lytic or blastic lesions are  present. No significant extracranial soft tissue lesion is present. Sinuses/Orbits: Fluid is present in the sphenoid sinuses bilaterally and in the nasopharynx. The paranasal sinuses and mastoid air cells are otherwise clear. The globes and orbits are within normal limits. IMPRESSION: 1. Stable appearance of left basal ganglia nonhemorrhagic infarction. 2. Stable medial left frontal extra-axial hemorrhage, extending into the ventricles bilaterally. 3. Subarachnoid hemorrhage posteriorly on the left and along the inter cerebral hemisphere likely represents redistribution without significant new hemorrhage. 4. Hemorrhage is present within the aqueduct of Sylvius. 5. Progressive dilation of the ventricles compatible with developing hydrocephalus. 6. No new areas of hemorrhage. Electronically Signed   By: Marin Roberts M.D.   On: 05/21/2020 06:39   CT HEAD WO CONTRAST  Result Date: 05/20/2020 CLINICAL DATA:  Intracranial hemorrhage follow up EXAM: CT HEAD WITHOUT CONTRAST TECHNIQUE: Contiguous axial images were obtained from the base of the skull through the vertex without intravenous contrast. COMPARISON:  05/19/2020 FINDINGS: Brain: There is subarachnoid hemorrhage again along the anterior interhemispheric fissure with extension into both lateral ventricles. The temporal horn of the left lateral ventricle has slightly increased in size. The amount of blood in the ventricles is also slightly greater. There is no herniation. Vascular: No hyperdense vessel or unexpected calcification. Skull: Normal. Negative for fracture or focal lesion. Sinuses/Orbits: No acute finding. Other: None. IMPRESSION: 1. Subarachnoid hemorrhage along the anterior interhemispheric fissure with slightly increased extension into both lateral ventricles. 2. Slight increase in size of the temporal horn of the left lateral ventricle consistent with early communicating  hydrocephalus. Electronically Signed   By: Deatra Robinson M.D.   On: 05/20/2020 01:31   CT HEAD WO CONTRAST  Result Date: 05/19/2020 CLINICAL DATA:  Patient status post intervention for left internal carotid and MCA occlusion. EXAM: CT HEAD WITHOUT CONTRAST TECHNIQUE: Contiguous axial images were obtained from the base of the skull through the vertex without intravenous contrast. COMPARISON:  Head CT earlier today. FINDINGS: Brain: The patient has a new large volume of subarachnoid and intraventricular hemorrhage centered anteriorly at the circle-of-Willis. Hemorrhage extends into the fourth ventricle. There is no midline shift or hydrocephalus. Gray-white differentiation appears preserved. No mass is identified. Imaged intracranial contents appear normal. Vascular: Large vessel structures are largely opacified with contrast. Skull: Intact. Sinuses/Orbits: Negative. Other: None. IMPRESSION: New large volume of subarachnoid and intraventricular hemorrhage. Critical Value/emergent results were called by telephone at the time of interpretation on 05/19/2020 at 3:38 pm to provider Dr. Amada Jupiter, who verbally acknowledged these results. Electronically Signed   By: Drusilla Kanner M.D.   On: 05/19/2020 15:42   CT Code Stroke CTA Neck W/WO contrast  Result Date: 05/19/2020 CLINICAL DATA:  Stroke suspected. EXAM: CT ANGIOGRAPHY HEAD AND NECK TECHNIQUE: Multidetector CT imaging of the head and neck was performed using the standard protocol during bolus administration of intravenous contrast. Multiplanar CT image reconstructions and MIPs were obtained to evaluate the vascular anatomy. Carotid stenosis measurements (when applicable) are obtained utilizing NASCET criteria, using the distal internal carotid diameter as the denominator. CONTRAST:  Dose is currently not known COMPARISON:  Head CT reported separately FINDINGS: CTA NECK FINDINGS Aortic arch: Negative.  Three vessel branching Right carotid system: No  atheromatous changes, dissection, or beading. Left carotid system: Mild motion artifact which could account for the mildly smudged appearance of the left ICA proximally. Vertebral arteries: No proximal subclavian stenosis. Codominant vertebral arteries that are smooth and widely patent to the dura. Skeleton: No acute or aggressive finding Other neck:  Negative Upper chest: Negative Review of the MIP images confirms the above findings CTA HEAD FINDINGS Anterior circulation: Cut off at the left ICA terminus with visible luminal clot. There is continuation of non opacification throughout the left M1 segment which is clot by CT. Non opacification at the left A1 origin. No contralateral branch occlusion is seen. Intravascular enhancement seen in the left operculum branches on the delayed phase. Posterior circulation: The vertebral and basilar arteries are smooth and widely patent. No branch occlusion, beading, or aneurysm. Venous sinuses: Patent Anatomic variants: None significant Review of the MIP images confirms the above findings These results were called by telephone at the time of interpretation on 05/19/2020 at 11:35 am to provider MCNEILL [facility] , who is already aware. IMPRESSION: 1. 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 2. Mildly indistinct appearance of the proximal left ICA is likely from motion artifact, consider cervical run. No atheromatous changes or vasculopathy seen in the neck. Electronically Signed   By: Marnee Spring M.D.   On: 05/19/2020 11:40   MR BRAIN WO CONTRAST  Result Date: 05/20/2020 CLINICAL DATA:  Sudden onset aphasia and right-sided weakness EXAM: MRI HEAD WITHOUT CONTRAST TECHNIQUE: Multiplanar, multiecho pulse sequences of the brain and surrounding structures were obtained without intravenous contrast. COMPARISON:  Head CT 05/20/2020 FINDINGS: Brain: Early subacute infarct of the left basal ganglia involving the posterior putamen and  caudate body. There is subarachnoid hemorrhage along the interhemispheric fissure extending into the lateral ventricles. There is early communicating hydrocephalus as evidenced by slightly increased size of the temporal horns. There is no herniation or other mass effect Vascular: Normal flow voids Skull and upper cervical spine: Normal Sinuses/Orbits: Normal Other: Fluid in the nasopharynx. IMPRESSION: 1. Early subacute infarct of the left basal ganglia involving the posterior putamen and caudate body. 2. Subarachnoid hemorrhage along the interhemispheric fissure extending into the lateral ventricles. 3. 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 Electronically Signed   By: Deatra Robinson M.D.   On: 05/20/2020 02:23   IR CT Head Ltd  Result Date: 05/21/2020 INDICATION: 24 year old female with a history of acute left MCA syndrome, presents for mechanical thrombectomy COVID test was confirmed positive after initiation of the case EXAM: ULTRASOUND-GUIDED RIGHT COMMON FEMORAL ARTERY ACCESS CERVICAL AND CEREBRAL ANGIOGRAM MECHANICAL THROMBECTOMY LEFT MCA ANGIO-SEAL FOR HEMOSTASIS COMPARISON:  CT imaging same day MEDICATIONS: 200 mcg intra arterial nitroglycerin. ANESTHESIA/SEDATION: The anesthesia team was present to provide general endotracheal tube anesthesia and for patient monitoring during the procedure. Intubation was performed in negative pressure [place] in neuro IR holding. Left radial arterial line was performed by the anesthesia team. Interventional neuro radiology nursing staff was also present. CONTRAST:  150 cc FLUOROSCOPY TIME:  Fluoroscopy Time: 53 minutes 36 seconds (1651 mGy). COMPLICATIONS: SIR LEVEL C - Requires therapy, minor hospitalization (<48 hrs). Subarachnoid hemorrhage TECHNIQUE: Informed written consent was obtained from the patient's family after a thorough discussion of the procedural risks, benefits and alternatives. Specific risks  discussed include: Bleeding, infection, contrast reaction, kidney injury/failure, need for further procedure/surgery, arterial injury or dissection, embolization to new territory, intracranial hemorrhage (10-15% risk), neurologic deterioration, cardiopulmonary collapse, death. All questions were addressed. Maximal Sterile Barrier Technique was utilized including during the procedure including caps, mask, sterile gowns, sterile gloves, sterile drape, hand hygiene and skin antiseptic. A timeout was performed prior to the initiation of the procedure. The anesthesia team was present to provide general endotracheal tube  anesthesia and for patient monitoring during the procedure. Interventional neuro radiology nursing staff was also present. FINDINGS: Initial Findings: Left common carotid artery: ICA patent with no tortuosity and no significant atherosclerosis. Separate branch from the aortic arch. Left external carotid artery: Patent with antegrade flow. Left internal carotid artery: The common carotid artery is relatively straight with no beading or significant narrowing. The AP image on the initial image demonstrates perhaps a slight irregularity on the lateral intimal surface which may correspond to the coronal reformatted images on the comparison CT preprocedure (image 25 of series 9). Given that there was no flow limiting problem, we elected to proceed with the thrombectomy Left MCA: The vertical horizontal petrous segment patent. Segment lacerum patent. Cavernous and supraclinoid patent. Occlusion of the proximal M1 segment with visible filling defect compatible with the findings on the CT. Left ACA: A1 is patent. There is a nearly 180 degree angulation of the A1 origin. Patent anterior communicating artery. There has been migration of the thromboembolism through the terminal segment into the A2 segment compared to the prior CT. Procedural findings: Baseline MCA flow: TICI 0 Left MCA: After 1 pass, there is no  significant restoration of flow. Upon placement of the stent tree bronch the second attempt, and angiogram demonstrated no significant flow through the solitaire. After the second pass, complete restoration of flow through the MCA territory was achieved, TICI 3. During this time, there was further migration of the A2 embolus to the pericallosal artery, with the ultimate occlusion approximately level to the coronal suture. Given that the anatomy would not allow and navigation of the microcatheter and intermediate catheter into the segment, and given the perceived density of the ACA thromboembolism, we ultimately withdrew from further attempts thrombectomy when the wire and microcatheter would not pass through the face of the thrombus. Final angiogram demonstrated no evidence of extravasation of contrast at this level. The flat panel CT demonstrates inter hemispheric subarachnoid hemorrhage without mass effect. Angiogram of the cervical ICA upon withdrawal of the balloon catheter demonstrated long segment irregularity, with variations in the diameter extending from beyond the bifurcation to the horizontal petrous segment. The irregularity was not only spatial but variations in temporal appearance, as the narrowing worsen to greater than 80%, such as that demonstrated on image 5/7 angiogram 23. After 20 minutes of placing orogastric tube and giving dual antiplatelets dosage, anticipating stenting, a repeat angiogram demonstrated improvement in the appearance of the diameter, image 5/7 of angiogram 24. At this time we elected not to stent. PROCEDURE: The anesthesia team was present to provide general endotracheal tube anesthesia and for patient monitoring during the procedure. Intubation was performed in negative pressure [place] in neuro IR holding. Interventional neuro radiology nursing staff was also present. Ultrasound survey of the right inguinal region was performed with images stored and sent to PACs. 11 blade  scalpel was used to make a small incision. Blunt dissection was performed with US guidance. A micropuncture needle was used access the right common femoral artery under ultrasound. With excellent arterial blood flow returned, an .018 micro wire was passed through the needle, observed to enter the abdominal aorta under fluoroscopy. The needle was removed, and a micropuncture sheath was placed over the wire. The inner dilator and wire were removed, and an 035 wire was advanced under fluoroscopy into the abdominal aorta. The sheath was removed and a 25cm 52F straight vascular sheath was placed. The dilator was removed and the sheath was flushed. Sheath was attached to pressurized  and heparinized saline bag for constant forward flow. A coaxial system was then advanced over the 035 wire. This included a 95cm 087 "Walrus" balloon guide with coaxial 125cm Berenstein diagnostic catheter. This was advanced to the proximal descending thoracic aorta. Wire was then removed. Double flush of the catheter was performed. Catheter was then used to select the left common carotid artery. Angiogram was performed. Using roadmap technique, the catheter was advanced over a standard glide wire into left cervical ICA, with luminal position achieved of the guide catheter. Wire was removed and angiogram was performed of the left carotid artery. Initial angiogram revealed no intimal/luminal irregularity and we proceeded with placement of the balloon guide into the distal cervical ICA. The balloon guide and the parents teen catheter were advanced with a Glidewire into the distal cervical segment of the ICA. Glidewire and the Berenstein catheter were gently removed and double flush was performed. Formal angiogram was performed. Road map function was used once the occluded vessel was identified. Copious back flush was performed and the balloon catheter was attached to heparinized and pressurized saline bag for forward flow. A second coaxial system  was then advanced through the balloon catheter, which included the selected intermediate catheter, microcatheter, and microwire. In this scenario, the set up included intermediate catheter, large-bore aspiration catheter, 135 cm, a Trevo Provue18 microcatheter, and 014 synchro soft wire. This system was advanced through the balloon guide catheter under the road-map function, with adequate back-flush at the rotating hemostatic valve at that back end of the balloon guide. Microcatheter and the intermediate catheter system were advanced through the terminal ICA and MCA to the level of the occlusion. The micro wire was then carefully advanced through the occluded segment. Microcatheter was then manipulated through the occluded segment and the wire was removed with saline drip at the hub. Blood was then aspirated through the hub of the microcatheter, and a gentle contrast injection was performed confirming intraluminal position. A rotating hemostatic valve was then attached to the back end of the microcatheter, and a pressurized and heparinized saline bag was attached to the catheter. 4 x 40 solitaire device was then selected. Back flush was achieved at the rotating hemostatic valve, and then the device was gently advanced through the microcatheter to the distal end. The retriever was then unsheathed by withdrawing the microcatheter under fluoroscopy. Once the retriever was completely unsheathed, the microcatheter was carefully stripped from the delivery device. A 3 minute time interval was observed. The balloon at the balloon guide catheter was then inflated under fluoroscopy for proximal flow arrest. Constant aspiration using the proprietary engine was then performed at the intermediate catheter, as the retriever was gently and slowly withdrawn with fluoroscopic observation. Once the retriever was "corked" within the tip of the intermediate catheter, both were removed from the system. Free aspiration was confirmed at  the hub of the balloon guide catheter, with free blood return confirmed. The balloon was then deflated, and a control angiogram was performed. The M1 segment remained occluded after the first pass. We then attempted a second pass. Microcatheter and the intermediate catheter system were again advanced through the terminal ICA and MCA to the level of the occlusion. The micro wire was then carefully advanced through the occluded segment. Microcatheter was then manipulated through the occluded segment and the wire was removed with saline drip at the hub. Blood was then aspirated through the hub of the microcatheter, and a gentle contrast injection was performed confirming intraluminal position. A rotating hemostatic  valve was then attached to the back end of the microcatheter, and a pressurized and heparinized saline bag was attached to the catheter. 4 x 40 solitaire device was again used. Back flush was achieved at the rotating hemostatic valve, and then the device was gently advanced through the microcatheter to the distal end. The retriever was then unsheathed by withdrawing the microcatheter under fluoroscopy. Once the retriever was completely unsheathed, the microcatheter was carefully stripped from the delivery device. Control angiogram was performed from the intermediate catheter, demonstrating that there was no flow through the open solitaire. A 3 minute time interval was observed. The balloon at the balloon guide catheter was then inflated under fluoroscopy for proximal flow arrest. Aspiration engine was turned on. Intermediate catheter was gently advanced over the proximal solitaire to the level of the occlusion, with flow arrest. Constant aspiration using the proprietary engine was then performed at the intermediate catheter, as both the retriever and the intermediate catheter were gently and slowly withdrawn with fluoroscopic observation. Once the retriever was "corked" within the tip of the intermediate  catheter, both were removed from the system. Free aspiration was confirmed at the hub of the balloon guide catheter, with free blood return confirmed. The balloon was then deflated, and a control angiogram was performed. Restoration of left MCA flow was confirmed. The initial left A2 segment occlusion was observed to have migrated distally to the pericallosal artery. Given the patient's young age we decided to proceed with attempt at mechanical thrombectomy of this ACA embolus. ACA thrombectomy attempt: The walrus balloon catheter remained in the distal cervical segment, proximal to the horizontal petrous segment. We advanced a coaxial wire and catheter system which included 014 synchro soft wire, Zoom 35 intermediate catheter, and tree though pro view 18 microcatheter. This was advanced through the cavernous segment to the supraclinoid segment. This catheter and wire combination failed to engage the angulated A1 origin. We then attempted a coaxial system which included CT 5 intermediate catheter, 150cm phenom 21 microcatheter, and synchro soft wire. This coaxial system was advanced through the cavernous segment, supraclinoid segment, and failed to engage the A1 segment. Various micro wires were then used including Aristotle wire, and double angle headliner wire. The 014 headliner wire was ultimately successful engaging the A1 segment, with a pericallosal position achieved. The intermediate catheter would not make the nearly 180 degree turn into the A1 segment. After several attempts at achieving n/a 1 position with the intermediate catheter, we decided that we could attempt to use an anchoring technique by deploying stent tree verb across the embolism, and then bringing the intermediate catheter over the microcatheter and stent deployed stent tree verb. This would require, however, passing through the embolus with the microcatheter to deployed the stent tree verge. The microcatheter and microwire combination would  not pass gently through the thromboembolism within the pericallosal artery, with quite a dense character observed when the wire and microcatheter were at the face of the thrombus. Ultimately we decided to withdraw from this attempt given the difficulty, and the apparent reactivity of the patient's carotid artery as we observed spasm and irregularity at the skull base on each subsequent control angiogram. The microcatheter was withdrawn to the cavernous segment of the ICA. Wire was removed. Exchange length floppy tip transcend wire was then placed through the microcatheter into a safe position. The balloon guide catheter was then withdrawn into the common carotid artery and angiogram was performed. This revealed irregularity and narrowing in the proximal ICA, which  continued with multiple variation in diameter through the cervical ICA to the distal segment at the site of the prior inflated balloon. These findings were discussed with the neurology team, with concern for dissection, as there was irregularity at this segment on the preprocedural CT scan, indicating possible vasculitis/dissection as not only the current imaging diagnosis, but potentially site of the thromboembolism origin. At this point we performed flat panel CT scan to observe for ICH/S AH, as treatment with a stent would require dual anti-platelet therapy. Flat panel CT demonstrated thin subarachnoid hemorrhage in the inter hemispheric fissure. Interval angiogram of the ICA was performed to determine the need for any possible stenting. 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 This imaging finding was concerning for progression of a possible intramural hematoma/dissection and at this point we elected to move forward with stenting. At least 20 minutes were required to place an orogastric tube and then deliver our prescribed dose of aspirin and Plavix. Once these events had  occurred, repeat angiogram was performed. Interestingly this image revealed significant improvement at the cervical segment, with residual irregularity (image XA #24). At this point we interpreted this as either improving vaso spasm or improving sequela of vasculitis/dissection. Given the flat panel head CT findings and the desire to avoid dual anti-platelet therapy long-term, we elected to withdraw from the case at this point with no stent deployed. Balloon guide was then removed, with all catheters and wires removed. The skin at the puncture site was then cleaned with Chlorhexidine. The 8 French sheath was removed and an 74F angioseal was deployed. Patient tolerated the procedure well and remained hemodynamically stable throughout. Blood loss estimated at 150 cc. IMPRESSION: Status post ultrasound guided access right common femoral artery for cervical/cerebral angiogram and mechanical thrombectomy of left MCA restoring complete flow to the left MCA territory, and failed attempt at revascularization of left pericallosal thromboembolism. Flat panel CT at the completion demonstrates thin subarachnoid hemorrhage in the anterior inter hemispheric fissure. Angio-Seal deployed for hemostasis. Signed, Yvone Neu. Reyne Dumas, RPVI [facility] Radiology PLAN: The patient will remain intubated, as she remains COVID status unknown ICU status Target systolic blood pressure of 140-160 Right hip straight time 6 hours Frequent neurovascular checks Repeat neurologic imaging with CT and/MRI at the discretion of neurology team Electronically Signed   By: Gilmer Mor D.O.   On: 05/21/2020 08:57   IR CT Head Ltd  Result Date: 05/21/2020 INDICATION: 24 year old female with a history of acute left MCA syndrome, presents for mechanical thrombectomy COVID test was confirmed positive after initiation of the case EXAM: ULTRASOUND-GUIDED RIGHT COMMON FEMORAL ARTERY ACCESS CERVICAL AND  CEREBRAL ANGIOGRAM MECHANICAL THROMBECTOMY LEFT MCA ANGIO-SEAL FOR HEMOSTASIS COMPARISON:  CT imaging same day MEDICATIONS: 200 mcg intra arterial nitroglycerin. ANESTHESIA/SEDATION: The anesthesia team was present to provide general endotracheal tube anesthesia and for patient monitoring during the procedure. Intubation was performed in negative pressure [place] in neuro IR holding. Left radial arterial line was performed by the anesthesia team. Interventional neuro radiology nursing staff was also present. CONTRAST:  150 cc FLUOROSCOPY TIME:  Fluoroscopy Time: 53 minutes 36 seconds (1651 mGy). COMPLICATIONS: SIR LEVEL C - Requires therapy, minor hospitalization (<48 hrs). Subarachnoid hemorrhage TECHNIQUE: Informed written consent was obtained from the patient's family after a thorough discussion of the procedural risks, benefits and alternatives. Specific risks discussed include: Bleeding, infection, contrast reaction, kidney injury/failure, need for further procedure/surgery, arterial injury  or dissection, embolization to new territory, intracranial hemorrhage (10-15% risk), neurologic deterioration, cardiopulmonary collapse, death. All questions were addressed. Maximal Sterile Barrier Technique was utilized including during the procedure including caps, mask, sterile gowns, sterile gloves, sterile drape, hand hygiene and skin antiseptic. A timeout was performed prior to the initiation of the procedure. The anesthesia team was present to provide general endotracheal tube anesthesia and for patient monitoring during the procedure. Interventional neuro radiology nursing staff was also present. FINDINGS: Initial Findings: Left common carotid artery: ICA patent with no tortuosity and no significant atherosclerosis. Separate branch from the aortic arch. Left external carotid artery: Patent with antegrade flow. Left internal carotid artery: The common carotid artery is relatively straight with no beading or significant  narrowing. The AP image on the initial image demonstrates perhaps a slight irregularity on the lateral intimal surface which may correspond to the coronal reformatted images on the comparison CT preprocedure (image 25 of series 9). Given that there was no flow limiting problem, we elected to proceed with the thrombectomy Left MCA: The vertical horizontal petrous segment patent. Segment lacerum patent. Cavernous and supraclinoid patent. Occlusion of the proximal M1 segment with visible filling defect compatible with the findings on the CT. Left ACA: A1 is patent. There is a nearly 180 degree angulation of the A1 origin. Patent anterior communicating artery. There has been migration of the thromboembolism through the terminal segment into the A2 segment compared to the prior CT. Procedural findings: Baseline MCA flow: TICI 0 Left MCA: After 1 pass, there is no significant restoration of flow. Upon placement of the stent tree bronch the second attempt, and angiogram demonstrated no significant flow through the solitaire. After the second pass, complete restoration of flow through the MCA territory was achieved, TICI 3. During this time, there was further migration of the A2 embolus to the pericallosal artery, with the ultimate occlusion approximately level to the coronal suture. Given that the anatomy would not allow and navigation of the microcatheter and intermediate catheter into the segment, and given the perceived density of the ACA thromboembolism, we ultimately withdrew from further attempts thrombectomy when the wire and microcatheter would not pass through the face of the thrombus. Final angiogram demonstrated no evidence of extravasation of contrast at this level. The flat panel CT demonstrates inter hemispheric subarachnoid hemorrhage without mass effect. Angiogram of the cervical ICA upon withdrawal of the balloon catheter demonstrated long segment irregularity, with variations in the diameter extending  from beyond the bifurcation to the horizontal petrous segment. The irregularity was not only spatial but variations in temporal appearance, as the narrowing worsen to greater than 80%, such as that demonstrated on image 5/7 angiogram 23. After 20 minutes of placing orogastric tube and giving dual antiplatelets dosage, anticipating stenting, a repeat angiogram demonstrated improvement in the appearance of the diameter, image 5/7 of angiogram 24. At this time we elected not to stent. PROCEDURE: The anesthesia team was present to provide general endotracheal tube anesthesia and for patient monitoring during the procedure. Intubation was performed in negative pressure [place] in neuro IR holding. Interventional neuro radiology nursing staff was also present. Ultrasound survey of the right inguinal region was performed with images stored and sent to PACs. 11 blade scalpel was used to make a small incision. Blunt dissection was performed with US guidance. A micropuncture needle was used access the right common femoral artery under ultrasound. With excellent arterial blood flow returned, an .018 micro wire was passed through the needle, observed to enter  the abdominal aorta under fluoroscopy. The needle was removed, and a micropuncture sheath was placed over the wire. The inner dilator and wire were removed, and an 035 wire was advanced under fluoroscopy into the abdominal aorta. The sheath was removed and a 25cm 82F straight vascular sheath was placed. The dilator was removed and the sheath was flushed. Sheath was attached to pressurized and heparinized saline bag for constant forward flow. A coaxial system was then advanced over the 035 wire. This included a 95cm 087 "Walrus" balloon guide with coaxial 125cm Berenstein diagnostic catheter. This was advanced to the proximal descending thoracic aorta. Wire was then removed. Double flush of the catheter was performed. Catheter was then used to select the left common carotid  artery. Angiogram was performed. Using roadmap technique, the catheter was advanced over a standard glide wire into left cervical ICA, with luminal position achieved of the guide catheter. Wire was removed and angiogram was performed of the left carotid artery. Initial angiogram revealed no intimal/luminal irregularity and we proceeded with placement of the balloon guide into the distal cervical ICA. The balloon guide and the parents teen catheter were advanced with a Glidewire into the distal cervical segment of the ICA. Glidewire and the Berenstein catheter were gently removed and double flush was performed. Formal angiogram was performed. Road map function was used once the occluded vessel was identified. Copious back flush was performed and the balloon catheter was attached to heparinized and pressurized saline bag for forward flow. A second coaxial system was then advanced through the balloon catheter, which included the selected intermediate catheter, microcatheter, and microwire. In this scenario, the set up included intermediate catheter, large-bore aspiration catheter, 135 cm, a Trevo Provue18 microcatheter, and 014 synchro soft wire. This system was advanced through the balloon guide catheter under the road-map function, with adequate back-flush at the rotating hemostatic valve at that back end of the balloon guide. Microcatheter and the intermediate catheter system were advanced through the terminal ICA and MCA to the level of the occlusion. The micro wire was then carefully advanced through the occluded segment. Microcatheter was then manipulated through the occluded segment and the wire was removed with saline drip at the hub. Blood was then aspirated through the hub of the microcatheter, and a gentle contrast injection was performed confirming intraluminal position. A rotating hemostatic valve was then attached to the back end of the microcatheter, and a pressurized and heparinized saline bag was  attached to the catheter. 4 x 40 solitaire device was then selected. Back flush was achieved at the rotating hemostatic valve, and then the device was gently advanced through the microcatheter to the distal end. The retriever was then unsheathed by withdrawing the microcatheter under fluoroscopy. Once the retriever was completely unsheathed, the microcatheter was carefully stripped from the delivery device. A 3 minute time interval was observed. The balloon at the balloon guide catheter was then inflated under fluoroscopy for proximal flow arrest. Constant aspiration using the proprietary engine was then performed at the intermediate catheter, as the retriever was gently and slowly withdrawn with fluoroscopic observation. Once the retriever was "corked" within the tip of the intermediate catheter, both were removed from the system. Free aspiration was confirmed at the hub of the balloon guide catheter, with free blood return confirmed. The balloon was then deflated, and a control angiogram was performed. The M1 segment remained occluded after the first pass. We then attempted a second pass. Microcatheter and the intermediate catheter system were again advanced through  the terminal ICA and MCA to the level of the occlusion. The micro wire was then carefully advanced through the occluded segment. Microcatheter was then manipulated through the occluded segment and the wire was removed with saline drip at the hub. Blood was then aspirated through the hub of the microcatheter, and a gentle contrast injection was performed confirming intraluminal position. A rotating hemostatic valve was then attached to the back end of the microcatheter, and a pressurized and heparinized saline bag was attached to the catheter. 4 x 40 solitaire device was again used. Back flush was achieved at the rotating hemostatic valve, and then the device was gently advanced through the microcatheter to the distal end. The retriever was then  unsheathed by withdrawing the microcatheter under fluoroscopy. Once the retriever was completely unsheathed, the microcatheter was carefully stripped from the delivery device. Control angiogram was performed from the intermediate catheter, demonstrating that there was no flow through the open solitaire. A 3 minute time interval was observed. The balloon at the balloon guide catheter was then inflated under fluoroscopy for proximal flow arrest. Aspiration engine was turned on. Intermediate catheter was gently advanced over the proximal solitaire to the level of the occlusion, with flow arrest. Constant aspiration using the proprietary engine was then performed at the intermediate catheter, as both the retriever and the intermediate catheter were gently and slowly withdrawn with fluoroscopic observation. Once the retriever was "corked" within the tip of the intermediate catheter, both were removed from the system. Free aspiration was confirmed at the hub of the balloon guide catheter, with free blood return confirmed. The balloon was then deflated, and a control angiogram was performed. Restoration of left MCA flow was confirmed. The initial left A2 segment occlusion was observed to have migrated distally to the pericallosal artery. Given the patient's young age we decided to proceed with attempt at mechanical thrombectomy of this ACA embolus. ACA thrombectomy attempt: The walrus balloon catheter remained in the distal cervical segment, proximal to the horizontal petrous segment. We advanced a coaxial wire and catheter system which included 014 synchro soft wire, Zoom 35 intermediate catheter, and tree though pro view 18 microcatheter. This was advanced through the cavernous segment to the supraclinoid segment. This catheter and wire combination failed to engage the angulated A1 origin. We then attempted a coaxial system which included CT 5 intermediate catheter, 150cm phenom 21 microcatheter, and synchro soft wire.  This coaxial system was advanced through the cavernous segment, supraclinoid segment, and failed to engage the A1 segment. Various micro wires were then used including Aristotle wire, and double angle headliner wire. The 014 headliner wire was ultimately successful engaging the A1 segment, with a pericallosal position achieved. The intermediate catheter would not make the nearly 180 degree turn into the A1 segment. After several attempts at achieving n/a 1 position with the intermediate catheter, we decided that we could attempt to use an anchoring technique by deploying stent tree verb across the embolism, and then bringing the intermediate catheter over the microcatheter and stent deployed stent tree verb. This would require, however, passing through the embolus with the microcatheter to deployed the stent tree verge. The microcatheter and microwire combination would not pass gently through the thromboembolism within the pericallosal artery, with quite a dense character observed when the wire and microcatheter were at the face of the thrombus. Ultimately we decided to withdraw from this attempt given the difficulty, and the apparent reactivity of the patient's carotid artery as we observed spasm and irregularity at  the skull base on each subsequent control angiogram. The microcatheter was withdrawn to the cavernous segment of the ICA. Wire was removed. Exchange length floppy tip transcend wire was then placed through the microcatheter into a safe position. The balloon guide catheter was then withdrawn into the common carotid artery and angiogram was performed. This revealed irregularity and narrowing in the proximal ICA, which continued with multiple variation in diameter through the cervical ICA to the distal segment at the site of the prior inflated balloon. These findings were discussed with the neurology team, with concern for dissection, as there was irregularity at this segment on the preprocedural CT scan,  indicating possible vasculitis/dissection as not only the current imaging diagnosis, but potentially site of the thromboembolism origin. At this point we performed flat panel CT scan to observe for ICH/S AH, as treatment with a stent would require dual anti-platelet therapy. Flat panel CT demonstrated thin subarachnoid hemorrhage in the inter hemispheric fissure. Interval angiogram of the ICA was performed to determine the need for any possible stenting. 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 This imaging finding was concerning for progression of a possible intramural hematoma/dissection and at this point we elected to move forward with stenting. At least 20 minutes were required to place an orogastric tube and then deliver our prescribed dose of aspirin and Plavix. Once these events had occurred, repeat angiogram was performed. Interestingly this image revealed significant improvement at the cervical segment, with residual irregularity (image XA #24). At this point we interpreted this as either improving vaso spasm or improving sequela of vasculitis/dissection. Given the flat panel head CT findings and the desire to avoid dual anti-platelet therapy long-term, we elected to withdraw from the case at this point with no stent deployed. Balloon guide was then removed, with all catheters and wires removed. The skin at the puncture site was then cleaned with Chlorhexidine. The 8 French sheath was removed and an 37F angioseal was deployed. Patient tolerated the procedure well and remained hemodynamically stable throughout. Blood loss estimated at 150 cc. IMPRESSION: Status post ultrasound guided access right common femoral artery for cervical/cerebral angiogram and mechanical thrombectomy of left MCA restoring complete flow to the left MCA territory, and failed attempt at revascularization of left pericallosal thromboembolism. Flat panel CT at the  completion demonstrates thin subarachnoid hemorrhage in the anterior inter hemispheric fissure. Angio-Seal deployed for hemostasis. Signed, Yvone Neu. Reyne Dumas, RPVI [facility] Inter[facility] Radiology [facility] Radiology PLAN: The patient will remain intubated, as she remains COVID status unknown ICU status Target systolic blood pressure of 140-160 Right hip straight time 6 hours Frequent neurovascular checks Repeat neurologic imaging with CT and/MRI at the discretion of neurology team Electronically Signed   By: Gilmer Mor D.O.   On: 05/21/2020 08:57   IR US Guide Vasc Access Right  Result Date: 05/21/2020 INDICATION: 25 year old female with a history of acute left MCA syndrome, presents for mechanical thrombectomy COVID test was confirmed positive after initiation of the case EXAM: ULTRASOUND-GUIDED RIGHT COMMON FEMORAL ARTERY ACCESS CERVICAL AND CEREBRAL ANGIOGRAM MECHANICAL THROMBECTOMY LEFT MCA ANGIO-SEAL FOR HEMOSTASIS COMPARISON:  CT imaging same day MEDICATIONS: 200 mcg intra arterial nitroglycerin. ANESTHESIA/SEDATION: The anesthesia team was present to provide general endotracheal tube anesthesia and for patient monitoring during the procedure. Intubation was performed in negative pressure Bay in neuro IR holding. Left radial arterial line was performed by the anesthesia team. Interventional neuro radiology nursing staff was also  present. CONTRAST:  150 cc FLUOROSCOPY TIME:  Fluoroscopy Time: 53 minutes 36 seconds (1651 mGy). COMPLICATIONS: SIR LEVEL C - Requires therapy, minor hospitalization (<48 hrs). Subarachnoid hemorrhage TECHNIQUE: Informed written consent was obtained from the patient's family after a thorough discussion of the procedural risks, benefits and alternatives. Specific risks discussed include: Bleeding, infection, contrast reaction, kidney injury/failure, need for further procedure/surgery, arterial injury or dissection, embolization to new territory, intracranial  hemorrhage (10-15% risk), neurologic deterioration, cardiopulmonary collapse, death. All questions were addressed. Maximal Sterile Barrier Technique was utilized including during the procedure including caps, mask, sterile gowns, sterile gloves, sterile drape, hand hygiene and skin antiseptic. A timeout was performed prior to the initiation of the procedure. The anesthesia team was present to provide general endotracheal tube anesthesia and for patient monitoring during the procedure. Interventional neuro radiology nursing staff was also present. FINDINGS: Initial Findings: Left common carotid artery: ICA patent with no tortuosity and no significant atherosclerosis. Separate branch from the aortic arch. Left external carotid artery: Patent with antegrade flow. Left internal carotid artery: The common carotid artery is relatively straight with no beading or significant narrowing. The AP image on the initial image demonstrates perhaps a slight irregularity on the lateral intimal surface which may correspond to the coronal reformatted images on the comparison CT preprocedure (image 25 of series 9). Given that there was no flow limiting problem, we elected to proceed with the thrombectomy Left MCA: The vertical horizontal petrous segment patent. Segment lacerum patent. Cavernous and supraclinoid patent. Occlusion of the proximal M1 segment with visible filling defect compatible with the findings on the CT. Left ACA: A1 is patent. There is a nearly 180 degree angulation of the A1 origin. Patent anterior communicating artery. There has been migration of the thromboembolism through the terminal segment into the A2 segment compared to the prior CT. Procedural findings: Baseline MCA flow: TICI 0 Left MCA: After 1 pass, there is no significant restoration of flow. Upon placement of the stent tree bronch the second attempt, and angiogram demonstrated no significant flow through the solitaire. After the second pass, complete  restoration of flow through the MCA territory was achieved, TICI 3. During this time, there was further migration of the A2 embolus to the pericallosal artery, with the ultimate occlusion approximately level to the coronal suture. Given that the anatomy would not allow and navigation of the microcatheter and intermediate catheter into the segment, and given the perceived density of the ACA thromboembolism, we ultimately withdrew from further attempts thrombectomy when the wire and microcatheter would not pass through the face of the thrombus. Final angiogram demonstrated no evidence of extravasation of contrast at this level. The flat panel CT demonstrates inter hemispheric subarachnoid hemorrhage without mass effect. Angiogram of the cervical ICA upon withdrawal of the balloon catheter demonstrated long segment irregularity, with variations in the diameter extending from beyond the bifurcation to the horizontal petrous segment. The irregularity was not only spatial but variations in temporal appearance, as the narrowing worsen to greater than 80%, such as that demonstrated on image 5/7 angiogram 23. After 20 minutes of placing orogastric tube and giving dual antiplatelets dosage, anticipating stenting, a repeat angiogram demonstrated improvement in the appearance of the diameter, image 5/7 of angiogram 24. At this time we elected not to stent. PROCEDURE: The anesthesia team was present to provide general endotracheal tube anesthesia and for patient monitoring during the procedure. Intubation was performed in negative pressure Bay in neuro IR holding. Interventional neuro radiology nursing staff was  also present. Ultrasound survey of the right inguinal region was performed with images stored and sent to PACs. 11 blade scalpel was used to make a small incision. Blunt dissection was performed with US guidance. A micropuncture needle was used access the right common femoral artery under ultrasound. With excellent  arterial blood flow returned, an .018 micro wire was passed through the needle, observed to enter the abdominal aorta under fluoroscopy. The needle was removed, and a micropuncture sheath was placed over the wire. The inner dilator and wire were removed, and an 035 wire was advanced under fluoroscopy into the abdominal aorta. The sheath was removed and a 25cm 38F straight vascular sheath was placed. The dilator was removed and the sheath was flushed. Sheath was attached to pressurized and heparinized saline bag for constant forward flow. A coaxial system was then advanced over the 035 wire. This included a 95cm 087 "Walrus" balloon guide with coaxial 125cm Berenstein diagnostic catheter. This was advanced to the proximal descending thoracic aorta. Wire was then removed. Double flush of the catheter was performed. Catheter was then used to select the left common carotid artery. Angiogram was performed. Using roadmap technique, the catheter was advanced over a standard glide wire into left cervical ICA, with luminal position achieved of the guide catheter. Wire was removed and angiogram was performed of the left carotid artery. Initial angiogram revealed no intimal/luminal irregularity and we proceeded with placement of the balloon guide into the distal cervical ICA. The balloon guide and the parents teen catheter were advanced with a Glidewire into the distal cervical segment of the ICA. Glidewire and the Berenstein catheter were gently removed and double flush was performed. Formal angiogram was performed. Road map function was used once the occluded vessel was identified. Copious back flush was performed and the balloon catheter was attached to heparinized and pressurized saline bag for forward flow. A second coaxial system was then advanced through the balloon catheter, which included the selected intermediate catheter, microcatheter, and microwire. In this scenario, the set up included intermediate catheter,  large-bore aspiration catheter, 135 cm, a Trevo Provue18 microcatheter, and 014 synchro soft wire. This system was advanced through the balloon guide catheter under the road-map function, with adequate back-flush at the rotating hemostatic valve at that back end of the balloon guide. Microcatheter and the intermediate catheter system were advanced through the terminal ICA and MCA to the level of the occlusion. The micro wire was then carefully advanced through the occluded segment. Microcatheter was then manipulated through the occluded segment and the wire was removed with saline drip at the hub. Blood was then aspirated through the hub of the microcatheter, and a gentle contrast injection was performed confirming intraluminal position. A rotating hemostatic valve was then attached to the back end of the microcatheter, and a pressurized and heparinized saline bag was attached to the catheter. 4 x 40 solitaire device was then selected. Back flush was achieved at the rotating hemostatic valve, and then the device was gently advanced through the microcatheter to the distal end. The retriever was then unsheathed by withdrawing the microcatheter under fluoroscopy. Once the retriever was completely unsheathed, the microcatheter was carefully stripped from the delivery device. A 3 minute time interval was observed. The balloon at the balloon guide catheter was then inflated under fluoroscopy for proximal flow arrest. Constant aspiration using the proprietary engine was then performed at the intermediate catheter, as the retriever was gently and slowly withdrawn with fluoroscopic observation. Once the retriever was "corked"  within the tip of the intermediate catheter, both were removed from the system. Free aspiration was confirmed at the hub of the balloon guide catheter, with free blood return confirmed. The balloon was then deflated, and a control angiogram was performed. The M1 segment remained occluded after the first  pass. We then attempted a second pass. Microcatheter and the intermediate catheter system were again advanced through the terminal ICA and MCA to the level of the occlusion. The micro wire was then carefully advanced through the occluded segment. Microcatheter was then manipulated through the occluded segment and the wire was removed with saline drip at the hub. Blood was then aspirated through the hub of the microcatheter, and a gentle contrast injection was performed confirming intraluminal position. A rotating hemostatic valve was then attached to the back end of the microcatheter, and a pressurized and heparinized saline bag was attached to the catheter. 4 x 40 solitaire device was again used. Back flush was achieved at the rotating hemostatic valve, and then the device was gently advanced through the microcatheter to the distal end. The retriever was then unsheathed by withdrawing the microcatheter under fluoroscopy. Once the retriever was completely unsheathed, the microcatheter was carefully stripped from the delivery device. Control angiogram was performed from the intermediate catheter, demonstrating that there was no flow through the open solitaire. A 3 minute time interval was observed. The balloon at the balloon guide catheter was then inflated under fluoroscopy for proximal flow arrest. Aspiration engine was turned on. Intermediate catheter was gently advanced over the proximal solitaire to the level of the occlusion, with flow arrest. Constant aspiration using the proprietary engine was then performed at the intermediate catheter, as both the retriever and the intermediate catheter were gently and slowly withdrawn with fluoroscopic observation. Once the retriever was "corked" within the tip of the intermediate catheter, both were removed from the system. Free aspiration was confirmed at the hub of the balloon guide catheter, with free blood return confirmed. The balloon was then deflated, and a control  angiogram was performed. Restoration of left MCA flow was confirmed. The initial left A2 segment occlusion was observed to have migrated distally to the pericallosal artery. Given the patient's young age we decided to proceed with attempt at mechanical thrombectomy of this ACA embolus. ACA thrombectomy attempt: The walrus balloon catheter remained in the distal cervical segment, proximal to the horizontal petrous segment. We advanced a coaxial wire and catheter system which included 014 synchro soft wire, Zoom 35 intermediate catheter, and tree though pro view 18 microcatheter. This was advanced through the cavernous segment to the supraclinoid segment. This catheter and wire combination failed to engage the angulated A1 origin. We then attempted a coaxial system which included CT 5 intermediate catheter, 150cm phenom 21 microcatheter, and synchro soft wire. This coaxial system was advanced through the cavernous segment, supraclinoid segment, and failed to engage the A1 segment. Various micro wires were then used including Aristotle wire, and double angle headliner wire. The 014 headliner wire was ultimately successful engaging the A1 segment, with a pericallosal position achieved. The intermediate catheter would not make the nearly 180 degree turn into the A1 segment. After several attempts at achieving n/a 1 position with the intermediate catheter, we decided that we could attempt to use an anchoring technique by deploying stent tree verb across the embolism, and then bringing the intermediate catheter over the microcatheter and stent deployed stent tree verb. This would require, however, passing through the embolus with the microcatheter to  deployed the stent tree verge. The microcatheter and microwire combination would not pass gently through the thromboembolism within the pericallosal artery, with quite a dense character observed when the wire and microcatheter were at the face of the thrombus. Ultimately we  decided to withdraw from this attempt given the difficulty, and the apparent reactivity of the patient's carotid artery as we observed spasm and irregularity at the skull base on each subsequent control angiogram. The microcatheter was withdrawn to the cavernous segment of the ICA. Wire was removed. Exchange length floppy tip transcend wire was then placed through the microcatheter into a safe position. The balloon guide catheter was then withdrawn into the common carotid artery and angiogram was performed. This revealed irregularity and narrowing in the proximal ICA, which continued with multiple variation in diameter through the cervical ICA to the distal segment at the site of the prior inflated balloon. These findings were discussed with the neurology team, with concern for dissection, as there was irregularity at this segment on the preprocedural CT scan, indicating possible vasculitis/dissection as not only the current imaging diagnosis, but potentially site of the thromboembolism origin. At this point we performed flat panel CT scan to observe for ICH/S AH, as treatment with a stent would require dual anti-platelet therapy. Flat panel CT demonstrated thin subarachnoid hemorrhage in the inter hemispheric fissure. Interval angiogram of the ICA was performed to determine the need for any possible stenting. 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 This imaging finding was concerning for progression of a possible intramural hematoma/dissection and at this point we elected to move forward with stenting. At least 20 minutes were required to place an orogastric tube and then deliver our prescribed dose of aspirin and Plavix. Once these events had occurred, repeat angiogram was performed. Interestingly this image revealed significant improvement at the cervical segment, with residual irregularity (image XA #24). At this point we interpreted this  as either improving vaso spasm or improving sequela of vasculitis/dissection. Given the flat panel head CT findings and the desire to avoid dual anti-platelet therapy long-term, we elected to withdraw from the case at this point with no stent deployed. Balloon guide was then removed, with all catheters and wires removed. The skin at the puncture site was then cleaned with Chlorhexidine. The 8 French sheath was removed and an 70F angioseal was deployed. Patient tolerated the procedure well and remained hemodynamically stable throughout. Blood loss estimated at 150 cc. IMPRESSION: Status post ultrasound guided access right common femoral artery for cervical/cerebral angiogram and mechanical thrombectomy of left MCA restoring complete flow to the left MCA territory, and failed attempt at revascularization of left pericallosal thromboembolism. Flat panel CT at the completion demonstrates thin subarachnoid hemorrhage in the anterior inter hemispheric fissure. Angio-Seal deployed for hemostasis. Signed, Yvone Neu. Reyne Dumas, RPVI [facility] Radiology PLAN: The patient will remain intubated, as she remains COVID status unknown ICU status Target systolic blood pressure of 140-160 Right hip straight time 6 hours Frequent neurovascular checks Repeat neurologic imaging with CT and/MRI at the discretion of neurology team Electronically Signed   By: Gilmer Mor D.O.   On: 05/21/2020 08:57   DG CHEST PORT 1 VIEW  Result Date: 05/22/2020 CLINICAL DATA:  COVID-19 positive. EXAM: PORTABLE CHEST 1 VIEW COMPARISON:  May 20, 2020. FINDINGS: Stable cardiomediastinal silhouette. Endotracheal and nasogastric tubes have been removed. No pneumothorax or pleural effusion is noted. Lungs are clear. Bony  thorax is unremarkable. IMPRESSION: No active disease. Electronically Signed   By: Lupita Raider M.D.   On: 05/22/2020 08:36   DG CHEST PORT 1 VIEW  Result Date:  05/20/2020 CLINICAL DATA:  CABG 19 positive, respiratory failure EXAM: PORTABLE CHEST 1 VIEW COMPARISON:  05/19/2020 chest radiograph. FINDINGS: Endotracheal tube tip is 4.1 cm above the carina. Enteric tube terminates in the gastric fundus. Stable cardiomediastinal silhouette with normal heart size. No pneumothorax. No pleural effusion. Mild platelike atelectasis in the mid to lower lungs. No pulmonary edema. IMPRESSION: 1. Well-positioned support structures. 2. Mild platelike atelectasis in the mid to lower lungs. Electronically Signed   By: Delbert Phenix M.D.   On: 05/20/2020 09:09   DG CHEST PORT 1 VIEW  Result Date: 05/19/2020 CLINICAL DATA:  LEFT MCA infarct and intracranial hemorrhage. Respiratory failure. COVID positive. EXAM: PORTABLE CHEST 1 VIEW COMPARISON:  11/07/2016 chest radiograph FINDINGS: An endotracheal tube is noted with tip 3 cm above the carina. An NG tube is noted coiled in the stomach. This is a mildly low volume film with mild bibasilar opacities which may represent atelectasis or airspace disease. No pleural effusion or pneumothorax. No acute bony abnormalities are present. IMPRESSION: 1. Endotracheal tube and NG tube placement. 2. Mildly low volume film with mild bibasilar opacities which may represent atelectasis or infection/pneumonia. Electronically Signed   By: Harmon Pier M.D.   On: 05/19/2020 18:03   ECHOCARDIOGRAM COMPLETE  Result Date: 05/20/2020    ECHOCARDIOGRAM REPORT   Patient Name:   ELAJAH KUNSMAN Date of Exam: 05/20/2020 Medical Rec #:  [id number]    Height:       64.0 in Accession #:    [id number]   Weight:       185.6 lb Date of Birth:  1995-10-28    BSA:          1.895 m Patient Age:    24 years     BP:           127/71 mmHg Patient Gender: F            HR:           64 bpm. Exam Location:  Inpatient Procedure: 2D Echo Indications:    Stroke  History:        Patient has no prior history of Echocardiogram examinations.                 Risk Factors:Current Smoker. Covid  Positive.  Sonographer:    Jeryl Columbia Referring Phys: [phone] MCNEILL P KIRKPATRICK IMPRESSIONS  1. Left ventricular ejection fraction, by estimation, is 60 to 65%. The left ventricle has normal function. The left ventricle has no regional wall motion abnormalities. Left ventricular diastolic parameters were normal.  2. Right ventricular systolic function is normal. The right ventricular size is normal.  3. The mitral valve is normal in structure. Trivial mitral valve regurgitation. No evidence of mitral stenosis.  4. The aortic valve is normal in structure. Aortic valve regurgitation is not visualized. No aortic stenosis is present.  5. The inferior vena cava is normal in size with greater than 50% respiratory variability, suggesting right atrial pressure of 3 mmHg. Comparison(s): No prior Echocardiogram. Conclusion(s)/Recommendation(s): Normal biventricular function without evidence of hemodynamically significant valvular heart disease. No intracardiac source of embolism detected on this transthoracic study. A transesophageal echocardiogram is recommended to exclude cardiac source of embolism if clinically indicated. FINDINGS  Left Ventricle: Left ventricular ejection fraction, by estimation, is 60 to  65%. The left ventricle has normal function. The left ventricle has no regional wall motion abnormalities. The left ventricular internal cavity size was normal in size. There is  no left ventricular hypertrophy. Left ventricular diastolic parameters were normal. Right Ventricle: The right ventricular size is normal. No increase in right ventricular wall thickness. Right ventricular systolic function is normal. Left Atrium: Left atrial size was normal in size. Right Atrium: Right atrial size was normal in size. Pericardium: There is no evidence of pericardial effusion. Mitral Valve: The mitral valve is normal in structure. Normal mobility of the mitral valve leaflets. Trivial mitral valve regurgitation. No evidence  of mitral valve stenosis. Tricuspid Valve: The tricuspid valve is normal in structure. Tricuspid valve regurgitation is trivial. No evidence of tricuspid stenosis. Aortic Valve: The aortic valve is normal in structure. Aortic valve regurgitation is not visualized. No aortic stenosis is present. Pulmonic Valve: The pulmonic valve was normal in structure. Pulmonic valve regurgitation is not visualized. No evidence of pulmonic stenosis. Aorta: The aortic root is normal in size and structure. Venous: The inferior vena cava is normal in size with greater than 50% respiratory variability, suggesting right atrial pressure of 3 mmHg. IAS/Shunts: No atrial level shunt detected by color flow Doppler.  LEFT VENTRICLE PLAX 2D LVIDd:         4.00 cm LVIDs:         2.60 cm LV PW:         1.20 cm LV IVS:        1.00 cm LVOT diam:     1.90 cm LVOT Area:     2.84 cm  RIGHT VENTRICLE TAPSE (M-mode): 2.6 cm LEFT ATRIUM             Index       RIGHT ATRIUM           Index LA diam:        3.00 cm 1.58 cm/m  RA Area:     14.30 cm LA Vol (A2C):   52.4 ml 27.65 ml/m RA Volume:   36.50 ml  19.26 ml/m LA Vol (A4C):   38.2 ml 20.15 ml/m LA Biplane Vol: 44.9 ml 23.69 ml/m   AORTA Ao Root diam: 2.70 cm  SHUNTS Systemic Diam: 1.90 cm Tobias Alexander MD Electronically signed by Tobias Alexander MD Signature Date/Time: 05/20/2020/4:13:45 PM    Final    IR PERCUTANEOUS ART THROMBECTOMY/INFUSION INTRACRANIAL INC DIAG ANGIO  Result Date: 05/21/2020 INDICATION: 24 year old female with a history of acute left MCA syndrome, presents for mechanical thrombectomy COVID test was confirmed positive after initiation of the case EXAM: ULTRASOUND-GUIDED RIGHT COMMON FEMORAL ARTERY ACCESS CERVICAL AND CEREBRAL ANGIOGRAM MECHANICAL THROMBECTOMY LEFT MCA ANGIO-SEAL FOR HEMOSTASIS COMPARISON:  CT imaging same day MEDICATIONS: 200 mcg intra arterial nitroglycerin. ANESTHESIA/SEDATION: The anesthesia team was present to provide general endotracheal tube  anesthesia and for patient monitoring during the procedure. Intubation was performed in negative pressure Bay in neuro IR holding. Left radial arterial line was performed by the anesthesia team. Interventional neuro radiology nursing staff was also present. CONTRAST:  150 cc FLUOROSCOPY TIME:  Fluoroscopy Time: 53 minutes 36 seconds (1651 mGy). COMPLICATIONS: SIR LEVEL C - Requires therapy, minor hospitalization (<48 hrs). Subarachnoid hemorrhage TECHNIQUE: Informed written consent was obtained from the patient's family after a thorough discussion of the procedural risks, benefits and alternatives. Specific risks discussed include: Bleeding, infection, contrast reaction, kidney injury/failure, need for further procedure/surgery, arterial injury or dissection, embolization to new territory, intracranial hemorrhage (  10-15% risk), neurologic deterioration, cardiopulmonary collapse, death. All questions were addressed. Maximal Sterile Barrier Technique was utilized including during the procedure including caps, mask, sterile gowns, sterile gloves, sterile drape, hand hygiene and skin antiseptic. A timeout was performed prior to the initiation of the procedure. The anesthesia team was present to provide general endotracheal tube anesthesia and for patient monitoring during the procedure. Interventional neuro radiology nursing staff was also present. FINDINGS: Initial Findings: Left common carotid artery: ICA patent with no tortuosity and no significant atherosclerosis. Separate branch from the aortic arch. Left external carotid artery: Patent with antegrade flow. Left internal carotid artery: The common carotid artery is relatively straight with no beading or significant narrowing. The AP image on the initial image demonstrates perhaps a slight irregularity on the lateral intimal surface which may correspond to the coronal reformatted images on the comparison CT preprocedure (image 25 of series 9). Given that there was  no flow limiting problem, we elected to proceed with the thrombectomy Left MCA: The vertical horizontal petrous segment patent. Segment lacerum patent. Cavernous and supraclinoid patent. Occlusion of the proximal M1 segment with visible filling defect compatible with the findings on the CT. Left ACA: A1 is patent. There is a nearly 180 degree angulation of the A1 origin. Patent anterior communicating artery. There has been migration of the thromboembolism through the terminal segment into the A2 segment compared to the prior CT. Procedural findings: Baseline MCA flow: TICI 0 Left MCA: After 1 pass, there is no significant restoration of flow. Upon placement of the stent tree bronch the second attempt, and angiogram demonstrated no significant flow through the solitaire. After the second pass, complete restoration of flow through the MCA territory was achieved, TICI 3. During this time, there was further migration of the A2 embolus to the pericallosal artery, with the ultimate occlusion approximately level to the coronal suture. Given that the anatomy would not allow and navigation of the microcatheter and intermediate catheter into the segment, and given the perceived density of the ACA thromboembolism, we ultimately withdrew from further attempts thrombectomy when the wire and microcatheter would not pass through the face of the thrombus. Final angiogram demonstrated no evidence of extravasation of contrast at this level. The flat panel CT demonstrates inter hemispheric subarachnoid hemorrhage without mass effect. Angiogram of the cervical ICA upon withdrawal of the balloon catheter demonstrated long segment irregularity, with variations in the diameter extending from beyond the bifurcation to the horizontal petrous segment. The irregularity was not only spatial but variations in temporal appearance, as the narrowing worsen to greater than 80%, such as that demonstrated on image 5/7 angiogram 23. After 20 minutes  of placing orogastric tube and giving dual antiplatelets dosage, anticipating stenting, a repeat angiogram demonstrated improvement in the appearance of the diameter, image 5/7 of angiogram 24. At this time we elected not to stent. PROCEDURE: The anesthesia team was present to provide general endotracheal tube anesthesia and for patient monitoring during the procedure. Intubation was performed in negative pressure [place] in neuro IR holding. Interventional neuro radiology nursing staff was also present. Ultrasound survey of the right inguinal region was performed with images stored and sent to PACs. 11 blade scalpel was used to make a small incision. Blunt dissection was performed with US guidance. A micropuncture needle was used access the right common femoral artery under ultrasound. With excellent arterial blood flow returned, an .018 micro wire was passed through the needle, observed to enter the abdominal aorta under fluoroscopy. The needle was  removed, and a micropuncture sheath was placed over the wire. The inner dilator and wire were removed, and an 035 wire was advanced under fluoroscopy into the abdominal aorta. The sheath was removed and a 25cm 107F straight vascular sheath was placed. The dilator was removed and the sheath was flushed. Sheath was attached to pressurized and heparinized saline bag for constant forward flow. A coaxial system was then advanced over the 035 wire. This included a 95cm 087 "Walrus" balloon guide with coaxial 125cm Berenstein diagnostic catheter. This was advanced to the proximal descending thoracic aorta. Wire was then removed. Double flush of the catheter was performed. Catheter was then used to select the left common carotid artery. Angiogram was performed. Using roadmap technique, the catheter was advanced over a standard glide wire into left cervical ICA, with luminal position achieved of the guide catheter. Wire was removed and angiogram was performed of the left carotid  artery. Initial angiogram revealed no intimal/luminal irregularity and we proceeded with placement of the balloon guide into the distal cervical ICA. The balloon guide and the parents teen catheter were advanced with a Glidewire into the distal cervical segment of the ICA. Glidewire and the Berenstein catheter were gently removed and double flush was performed. Formal angiogram was performed. Road map function was used once the occluded vessel was identified. Copious back flush was performed and the balloon catheter was attached to heparinized and pressurized saline bag for forward flow. A second coaxial system was then advanced through the balloon catheter, which included the selected intermediate catheter, microcatheter, and microwire. In this scenario, the set up included intermediate catheter, large-bore aspiration catheter, 135 cm, a Trevo Provue18 microcatheter, and 014 synchro soft wire. This system was advanced through the balloon guide catheter under the road-map function, with adequate back-flush at the rotating hemostatic valve at that back end of the balloon guide. Microcatheter and the intermediate catheter system were advanced through the terminal ICA and MCA to the level of the occlusion. The micro wire was then carefully advanced through the occluded segment. Microcatheter was then manipulated through the occluded segment and the wire was removed with saline drip at the hub. Blood was then aspirated through the hub of the microcatheter, and a gentle contrast injection was performed confirming intraluminal position. A rotating hemostatic valve was then attached to the back end of the microcatheter, and a pressurized and heparinized saline bag was attached to the catheter. 4 x 40 solitaire device was then selected. Back flush was achieved at the rotating hemostatic valve, and then the device was gently advanced through the microcatheter to the distal end. The retriever was then unsheathed by  withdrawing the microcatheter under fluoroscopy. Once the retriever was completely unsheathed, the microcatheter was carefully stripped from the delivery device. A 3 minute time interval was observed. The balloon at the balloon guide catheter was then inflated under fluoroscopy for proximal flow arrest. Constant aspiration using the proprietary engine was then performed at the intermediate catheter, as the retriever was gently and slowly withdrawn with fluoroscopic observation. Once the retriever was "corked" within the tip of the intermediate catheter, both were removed from the system. Free aspiration was confirmed at the hub of the balloon guide catheter, with free blood return confirmed. The balloon was then deflated, and a control angiogram was performed. The M1 segment remained occluded after the first pass. We then attempted a second pass. Microcatheter and the intermediate catheter system were again advanced through the terminal ICA and MCA to the level  of the occlusion. The micro wire was then carefully advanced through the occluded segment. Microcatheter was then manipulated through the occluded segment and the wire was removed with saline drip at the hub. Blood was then aspirated through the hub of the microcatheter, and a gentle contrast injection was performed confirming intraluminal position. A rotating hemostatic valve was then attached to the back end of the microcatheter, and a pressurized and heparinized saline bag was attached to the catheter. 4 x 40 solitaire device was again used. Back flush was achieved at the rotating hemostatic valve, and then the device was gently advanced through the microcatheter to the distal end. The retriever was then unsheathed by withdrawing the microcatheter under fluoroscopy. Once the retriever was completely unsheathed, the microcatheter was carefully stripped from the delivery device. Control angiogram was performed from the intermediate catheter, demonstrating  that there was no flow through the open solitaire. A 3 minute time interval was observed. The balloon at the balloon guide catheter was then inflated under fluoroscopy for proximal flow arrest. Aspiration engine was turned on. Intermediate catheter was gently advanced over the proximal solitaire to the level of the occlusion, with flow arrest. Constant aspiration using the proprietary engine was then performed at the intermediate catheter, as both the retriever and the intermediate catheter were gently and slowly withdrawn with fluoroscopic observation. Once the retriever was "corked" within the tip of the intermediate catheter, both were removed from the system. Free aspiration was confirmed at the hub of the balloon guide catheter, with free blood return confirmed. The balloon was then deflated, and a control angiogram was performed. Restoration of left MCA flow was confirmed. The initial left A2 segment occlusion was observed to have migrated distally to the pericallosal artery. Given the patient's young age we decided to proceed with attempt at mechanical thrombectomy of this ACA embolus. ACA thrombectomy attempt: The walrus balloon catheter remained in the distal cervical segment, proximal to the horizontal petrous segment. We advanced a coaxial wire and catheter system which included 014 synchro soft wire, Zoom 35 intermediate catheter, and tree though pro view 18 microcatheter. This was advanced through the cavernous segment to the supraclinoid segment. This catheter and wire combination failed to engage the angulated A1 origin. We then attempted a coaxial system which included CT 5 intermediate catheter, 150cm phenom 21 microcatheter, and synchro soft wire. This coaxial system was advanced through the cavernous segment, supraclinoid segment, and failed to engage the A1 segment. Various micro wires were then used including Aristotle wire, and double angle headliner wire. The 014 headliner wire was ultimately  successful engaging the A1 segment, with a pericallosal position achieved. The intermediate catheter would not make the nearly 180 degree turn into the A1 segment. After several attempts at achieving n/a 1 position with the intermediate catheter, we decided that we could attempt to use an anchoring technique by deploying stent tree verb across the embolism, and then bringing the intermediate catheter over the microcatheter and stent deployed stent tree verb. This would require, however, passing through the embolus with the microcatheter to deployed the stent tree verge. The microcatheter and microwire combination would not pass gently through the thromboembolism within the pericallosal artery, with quite a dense character observed when the wire and microcatheter were at the face of the thrombus. Ultimately we decided to withdraw from this attempt given the difficulty, and the apparent reactivity of the patient's carotid artery as we observed spasm and irregularity at the skull base on each subsequent control angiogram.  The microcatheter was withdrawn to the cavernous segment of the ICA. Wire was removed. Exchange length floppy tip transcend wire was then placed through the microcatheter into a safe position. The balloon guide catheter was then withdrawn into the common carotid artery and angiogram was performed. This revealed irregularity and narrowing in the proximal ICA, which continued with multiple variation in diameter through the cervical ICA to the distal segment at the site of the prior inflated balloon. These findings were discussed with the neurology team, with concern for dissection, as there was irregularity at this segment on the preprocedural CT scan, indicating possible vasculitis/dissection as not only the current imaging diagnosis, but potentially site of the thromboembolism origin. At this point we performed flat panel CT scan to observe for ICH/S AH, as treatment with a stent would require dual  anti-platelet therapy. Flat panel CT demonstrated thin subarachnoid hemorrhage in the inter hemispheric fissure. Interval angiogram of the ICA was performed to determine the need for any possible stenting. 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 This imaging finding was concerning for progression of a possible intramural hematoma/dissection and at this point we elected to move forward with stenting. At least 20 minutes were required to place an orogastric tube and then deliver our prescribed dose of aspirin and Plavix. Once these events had occurred, repeat angiogram was performed. Interestingly this image revealed significant improvement at the cervical segment, with residual irregularity (image XA #24). At this point we interpreted this as either improving vaso spasm or improving sequela of vasculitis/dissection. Given the flat panel head CT findings and the desire to avoid dual anti-platelet therapy long-term, we elected to withdraw from the case at this point with no stent deployed. Balloon guide was then removed, with all catheters and wires removed. The skin at the puncture site was then cleaned with Chlorhexidine. The 8 French sheath was removed and an 7F angioseal was deployed. Patient tolerated the procedure well and remained hemodynamically stable throughout. Blood loss estimated at 150 cc. IMPRESSION: Status post ultrasound guided access right common femoral artery for cervical/cerebral angiogram and mechanical thrombectomy of left MCA restoring complete flow to the left MCA territory, and failed attempt at revascularization of left pericallosal thromboembolism. Flat panel CT at the completion demonstrates thin subarachnoid hemorrhage in the anterior inter hemispheric fissure. Angio-Seal deployed for hemostasis. Signed, Yvone Neu. Reyne Dumas, RPVI [facility] Radiology PLAN: The patient  will remain intubated, as she remains COVID status unknown ICU status Target systolic blood pressure of 140-160 Right hip straight time 6 hours Frequent neurovascular checks Repeat neurologic imaging with CT and/MRI at the discretion of neurology team Electronically Signed   By: Gilmer Mor D.O.   On: 05/21/2020 08:57   CT HEAD CODE STROKE WO CONTRAST  Result Date: 05/19/2020 CLINICAL DATA:  Code stroke.  Stroke suspected. EXAM: CT HEAD WITHOUT CONTRAST TECHNIQUE: Contiguous axial images were obtained from the base of the skull through the vertex without intravenous contrast. COMPARISON:  None. FINDINGS: Brain: No evidence of acute infarction, hemorrhage, hydrocephalus, extra-axial collection or mass lesion/mass effect. Vascular: Dense left ICA terminus and MCA, extensive in compatible with thrombosis in this setting and assuming referable symptoms. CTA is underway. Skull: Negative Sinuses/Orbits: Negative Other: These results were communicated to Dr. Amada Jupiter at 11:25 amon 8/21/2021by text page via the [facility] messaging system. ASPECTS [facility] Stroke Program Early CT Score) - Ganglionic level infarction (caudate, lentiform nuclei, internal capsule,  insula, M1-M3 cortex): 7 - Supraganglionic infarction (M4-M6 cortex): 3 Total score (0-10 with 10 being normal): 10 IMPRESSION: Dense left ICA terminus and MCA. No acute hemorrhage. ASPECTS is 10. Electronically Signed   By: Marnee Spring M.D.   On: 05/19/2020 11:27   VAS [place] LOWER EXTREMITY VENOUS (DVT)  Result Date: 05/21/2020  Lower Venous DVTStudy Indications: Stroke.  Limitations: Bandages. Comparison Study: No prior studies. Performing Technologist: Jean Rosenthal  Examination Guidelines: A complete evaluation includes B-mode imaging, spectral Doppler, color Doppler, and power Doppler as needed of all accessible portions of each vessel. Bilateral testing is considered an integral part of a complete examination. Limited examinations for reoccurring  indications may be performed as noted. The reflux portion of the exam is performed with the patient in reverse Trendelenburg.  +---------+---------------+---------+-----------+----------+-------------------+ RIGHT    CompressibilityPhasicitySpontaneityPropertiesThrombus Aging      +---------+---------------+---------+-----------+----------+-------------------+ CFV                                                   Unable to visualize                                                       due to bandages     +---------+---------------+---------+-----------+----------+-------------------+ SFJ                                                   Unable to visualize                                                       due to bandages     +---------+---------------+---------+-----------+----------+-------------------+ FV Prox  Full                                                             +---------+---------------+---------+-----------+----------+-------------------+ FV Mid   Full                                                             +---------+---------------+---------+-----------+----------+-------------------+ FV DistalFull                                                             +---------+---------------+---------+-----------+----------+-------------------+ PFV      Full                                                             +---------+---------------+---------+-----------+----------+-------------------+  POP      Full           Yes      Yes                                      +---------+---------------+---------+-----------+----------+-------------------+ PTV      Full                                                             +---------+---------------+---------+-----------+----------+-------------------+ PERO     Full                                                              +---------+---------------+---------+-----------+----------+-------------------+   +---------+---------------+---------+-----------+----------+-------------------+ LEFT     CompressibilityPhasicitySpontaneityPropertiesThrombus Aging      +---------+---------------+---------+-----------+----------+-------------------+ CFV      Full           Yes      Yes                                      +---------+---------------+---------+-----------+----------+-------------------+ SFJ      Full                                                             +---------+---------------+---------+-----------+----------+-------------------+ FV Prox  Full                                                             +---------+---------------+---------+-----------+----------+-------------------+ FV Mid   Full           Yes      Yes                  Some segments not                                                         visualized due to                                                         bandages            +---------+---------------+---------+-----------+----------+-------------------+ FV DistalFull           Yes      Yes                                      +---------+---------------+---------+-----------+----------+-------------------+  PFV      Full                                                             +---------+---------------+---------+-----------+----------+-------------------+ POP      Full           Yes      Yes                                      +---------+---------------+---------+-----------+----------+-------------------+ PTV      Full                                                             +---------+---------------+---------+-----------+----------+-------------------+ PERO     Full                                                             +---------+---------------+---------+-----------+----------+-------------------+     Summary: RIGHT: - There is no evidence of deep vein thrombosis in the lower extremity. However, portions of this examination were limited- see technologist comments above.  - No cystic structure found in the popliteal fossa.  LEFT: - There is no evidence of deep vein thrombosis in the lower extremity. However, portions of this examination were limited- see technologist comments above.  - No cystic structure found in the popliteal fossa.  *See table(s) above for measurements and observations. Electronically signed by Fabienne Bruns MD on 05/21/2020 at 5:11:44 PM.    Final    [place] EKG SITE RITE  Result Date: 05/21/2020 If Site Rite image not attached, placement could not be confirmed due to current cardiac rhythm.   Labs:  CBC: Recent Labs    05/19/20 2232 05/20/20 0958 05/21/20 1220 05/22/20 0356  WBC 7.4 7.6 10.9* 12.2*  HGB 12.2 11.6* 11.2* 10.4*  HCT 37.9 36.3 35.4* 33.5*  PLT 288 273 243 209    COAGS: Recent Labs    05/19/20 1120  INR 0.9  APTT 27    BMP: Recent Labs    05/19/20 1807 05/19/20 1807 05/20/20 0958 05/20/20 1923 05/21/20 0057 05/21/20 1220 05/21/20 2334 05/22/20 0356  NA 139   < > 149*  149*   < > 148* 148* 152* 150*  K 3.8  --  3.3*  --  4.4  --   --  3.2*  CL 109  --  117*  --  119*  --   --  116*  CO2 21*  --  22  --  19*  --   --  22  GLUCOSE 111*  --  125*  --  115*  --   --  127*  BUN 5*  --  6  --  8  --   --  6  CALCIUM 8.2*  --  8.4*  --  8.6*  --   --  8.8*  CREATININE 0.66  --  0.74  --  0.57  --   --  0.54  GFRNONAA >60  --  >60  --  >60  --   --  >60  GFRAA >60  --  >60  --  >60  --   --  >60   < > = values in this interval not displayed.    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 Loreta Ave. Patient's condition stable- awake and follows some simple commands, moving left side, right hand grip intact but weaker compared to left, no movements of RLE, able to phonate some syllables but otherwise aphasic. Right femoral puncture site stable, distal pulses (DPs) 1+ bilaterally. Further plans per neurology- appreciate and agree with management. Please call NIR with questions/concerns.   Electronically Signed: Elwin Mocha, PA-C 05/22/2020, 9:14 AM   I spent a total of 25 Minutes at the the patient's bedside AND on the patient's hospital floor or unit, greater than 50% of which was counseling/coordinating care for CVA s/p attempted revascularization.

## 2020-05-22 NOTE — Progress Notes (Signed)
  Speech Language Pathology Treatment: Dysphagia;Cognitive-Linquistic  Patient Details Name: Kairy Folsom MRN: [id number] DOB: Jan 31, 1996 Today's Date: 05/22/2020 Time: 0277-4128 SLP Time Calculation (min) (ACUTE ONLY): 23 min  Assessment / Plan / Recommendation Clinical Impression  Pt is alert today and stated both her first and last name independently. Her voice is stronger today, although she still has limited verbal output. She did not verbally respond to automatic speech task of counting when cued, but she did automatically begin gesturing/counting along with her fingers. She followed simple, one-step commands with Min cues, but had more difficulty with more abstract instructions like drinking three ounces of water without stopping. She did have a lot less subswallows with boluses compared to yesterday. There is less coughing as well, although she does have intermittent throat clearing with liquids (thin and nectar thick). She is ready for MBS today. Will tentatively schedule for this afternoon.    HPI HPI: Pt is a 24 yo female presenting with sudden onset R sided weakness and aphasia. 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 She was intubated for thrombectomy 8/21. 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 MRI showed early subacute infarct of the left basal ganglia involving the posterior putamen and caudate body. Pt self-extubated 8/22. Pt tested (+) for COVID-19 but was asymptomatic upon arrival. PMH: panic attack, anxiety      SLP Plan  MBS       Recommendations  Diet recommendations: Other(comment) (ice chips) Medication Administration: Crushed with puree                Oral Care Recommendations: Oral care QID Follow up Recommendations: Inpatient Rehab SLP Visit Diagnosis: Aphasia (R47.01);Dysphagia, unspecified (R13.10) Plan: MBS       GO                Mahala Menghini., M.A. CCC-SLP Acute Rehabilitation Services Pager [phone] Office  [phone]  05/22/2020, 11:39 AM

## 2020-05-22 NOTE — TOC Initial Note (Signed)
Transition of Care [facility]) - Initial/Assessment Note    Patient Details  Name: Gail Montgomery MRN: [id number] Date of Birth: [birth year]  Transition of Care [facility]) CM/SW Contact:    Glennon Mac, RN Phone Number: 05/22/2020, 4:14 PM  Clinical Narrative:  24 yo who had acute R sided weakness and aphasia 05/19/20 due to LVO at [facility]. Screened Covid + but asymptomatic; tPA and underwent thrombectomy; during procedure had ICA vasospasm vs dissection.  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 Pt self extubated 05/20/20.  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  She plans to discharge home with her mother at discharge.  PT/OT recommending CIR; per admissions coordinator,CIR is able to consider admit once >20 days since COVID + or once has 2 negative COVID tests.  Spoke with patient's mother, Darlina Sicilian: Prepared two letters for her, as patient will need to withdrawal from fall semester at [employer], and cancel her lease with [employer].  Letters scanned to mother at [email or web address].  Expected Discharge Plan: IP Rehab Facility Barriers to Discharge: Continued Medical Work up          Expected Discharge Plan and Services Expected Discharge Plan: IP Rehab Facility   Discharge Planning Services: CM Consult Post Acute Care Choice: IP Rehab Living arrangements for the past 2 months: Apartment                                      Prior Living Arrangements/Services Living arrangements for the past 2 months: Apartment Lives with:: Self Patient language and need for interpreter reviewed:: Yes Do you feel safe going back to the place where you live?: Yes      Need for Family Participation in Patient Care: Yes (Comment) Care giver support system in place?: Yes (comment)    Criminal Activity/Legal Involvement Pertinent to Current Situation/Hospitalization: No - Comment as needed               Emotional Assessment Appearance:: Appears stated age Attitude/Demeanor/Rapport: Lethargic Affect (typically observed): Flat Orientation: : Oriented to Self      Admission diagnosis:  Stroke [facility]) [I63.9] Stroke (cerebrum) [facility]) [I63.9] Patient Active Problem List   Diagnosis Date Noted  . Stroke (cerebrum) (HCC) 05/19/2020   PCP:  No primary care provider on file. Pharmacy:   [id number] [place], [place] - [address] AT [facility] [address] [place] [address] Phone: [phone] Fax: [phone]     Social Determinants of Health (SDOH) Interventions    Readmission Risk Interventions No flowsheet data found.  Quintella Baton, RN, BSN  Trauma/Neuro ICU Case Manager [phone]

## 2020-05-23 DIAGNOSIS — I63232 Cerebral infarction due to unspecified occlusion or stenosis of left carotid arteries: Secondary | ICD-10-CM

## 2020-05-23 DIAGNOSIS — E0781 Sick-euthyroid syndrome: Secondary | ICD-10-CM

## 2020-05-23 DIAGNOSIS — E876 Hypokalemia: Secondary | ICD-10-CM

## 2020-05-23 LAB — BASIC METABOLIC PANEL
Anion gap: 11 (ref 5–15)
BUN: 9 mg/dL (ref 6–20)
CO2: 22 mmol/L (ref 22–32)
Calcium: 9 mg/dL (ref 8.9–10.3)
Chloride: 107 mmol/L (ref 98–111)
Creatinine, Ser: 0.57 mg/dL (ref 0.44–1.00)
GFR calc Af Amer: >60 mL/min (ref >60)
GFR calc non Af Amer: >60 mL/min (ref >60)
Glucose, Bld: 118 mg/dL — ABNORMAL HIGH (ref 70–99)
Potassium: 3.4 mmol/L — ABNORMAL LOW (ref 3.5–5.1)
Sodium: 140 mmol/L (ref 135–145)

## 2020-05-23 LAB — CARDIOLIPIN ANTIBODIES, IGG, IGM, IGA
Anticardiolipin IgA: <9 APL U/mL (ref 0–11)
Anticardiolipin IgG: 9 GPL U/mL (ref 0–14)
Anticardiolipin IgM: <9 MPL U/mL (ref 0–12)

## 2020-05-23 LAB — MPO/PR-3 (ANCA) ANTIBODIES
ANCA Proteinase 3: 6 U/mL — ABNORMAL HIGH (ref 0.0–3.5)
Myeloperoxidase Abs: <9 U/mL (ref 0.0–9.0)

## 2020-05-23 LAB — CBC
HCT: 36.2 % (ref 36.0–46.0)
Hemoglobin: 11.6 g/dL — ABNORMAL LOW (ref 12.0–15.0)
MCH: 29.4 pg (ref 26.0–34.0)
MCHC: 32 g/dL (ref 30.0–36.0)
MCV: 91.9 fL (ref 80.0–100.0)
Platelets: 200 10*3/uL (ref 150–400)
RBC: 3.94 MIL/uL (ref 3.87–5.11)
RDW: 13.1 % (ref 11.5–15.5)
WBC: 12.5 10*3/uL — ABNORMAL HIGH (ref 4.0–10.5)
nRBC: 0 % (ref 0.0–0.2)

## 2020-05-23 LAB — ANCA TITERS
Atypical P-ANCA titer: <1:20 {titer}
C-ANCA: <1:20 {titer}
P-ANCA: <1:20 {titer}

## 2020-05-23 LAB — LUPUS ANTICOAGULANT PANEL
DRVVT: 46.9 s (ref 0.0–47.0)
PTT Lupus Anticoagulant: 30.1 s (ref 0.0–51.9)

## 2020-05-23 LAB — T3: T3, Total: 111 ng/dL (ref 71–180)

## 2020-05-23 LAB — SICKLE CELL SCREEN: Sickle Cell Screen: NEGATIVE

## 2020-05-23 LAB — C-REACTIVE PROTEIN: CRP: 8.7 mg/dL — ABNORMAL HIGH (ref <1.0)

## 2020-05-23 LAB — MISC LABCORP TEST (SEND OUT): Labcorp test code: 164090

## 2020-05-23 LAB — HOMOCYSTEINE: Homocysteine: 6.3 umol/L (ref 0.0–14.5)

## 2020-05-23 LAB — FERRITIN: Ferritin: 93 ng/mL (ref 11–307)

## 2020-05-23 LAB — D-DIMER, QUANTITATIVE: D-Dimer, Quant: >20 ug/mL-FEU — ABNORMAL HIGH (ref 0.00–0.50)

## 2020-05-23 MED ORDER — VANCOMYCIN HCL IN DEXTROSE 1-5 GM/200ML-% IV SOLN
1000.0000 mg | Freq: Three times a day (TID) | INTRAVENOUS | Status: DC
  Administered 2020-05-23 – 2020-05-25 (×6): 1000 mg via INTRAVENOUS
  Filled 2020-05-23 (×5): qty 200

## 2020-05-23 MED ORDER — PIPERACILLIN-TAZOBACTAM 3.375 G IVPB
3.3750 g | Freq: Three times a day (TID) | INTRAVENOUS | Status: DC
  Administered 2020-05-23 – 2020-05-25 (×6): 3.375 g via INTRAVENOUS
  Filled 2020-05-23 (×6): qty 50

## 2020-05-23 MED ORDER — SODIUM CHLORIDE 0.9 % IV SOLN
INTRAVENOUS | Status: DC | PRN
  Administered 2020-05-23: 500 mL via INTRAVENOUS

## 2020-05-23 NOTE — Progress Notes (Signed)
Pharmacy Antibiotic Note  Gail Montgomery is a 24 y.o. female admitted on 05/19/2020 with stroke. Now spiking fever, source unknown. Pharmacy has been consulted for vancomycin and Zosyn dosing.  Est CrCl > 100 ml/min  Plan: Vancomycin 1g IV q 8 hrs. Zosyn 3.375g IV q 8 hrs. F/u cultures,renal function, clinical course.  Height: 5\' 4"  (162.6 cm) Weight: 84.2 kg (185 lb 10 oz) IBW/kg (Calculated) : 54.7  Temp (24hrs), Avg:101 F (38.3 C), Min:98.8 F (37.1 C), Max:103.1 F (39.5 C)  Recent Labs  Lab 05/19/20 1120 05/19/20 1807 05/19/20 2232 05/20/20 0958 05/21/20 0057 05/21/20 1220 05/22/20 0356 05/23/20 0900  WBC   < >  --  7.4 7.6  --  10.9* 12.2* 12.5*  CREATININE  --  0.66  --  0.74 0.57  --  0.54 0.57  LATICACIDVEN  --  1.7  --   --   --   --   --   --    < > = values in this interval not displayed.    Estimated Creatinine Clearance: 115.8 mL/min (by C-G formula based on SCr of 0.57 mg/dL).    Allergies  Allergen Reactions  . 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    Antimicrobials this admission: Vancomycin 8/25>  Zosyn 8/25>   Dose adjustments this admission:  Microbiology results: 8/25 BCx x 2> 8/21 COVID +  Thank you for allowing pharmacy to be a part of this patient's care.   9/21, Reece Leader, BCCP Clinical Pharmacist  05/23/2020 9:01 PM   [facility] pharmacy phone numbers are listed on [email or web address]

## 2020-05-23 NOTE — Progress Notes (Signed)
SLP Cancellation Note  Patient Details Name: Gail Montgomery MRN: [id number] DOB: 15-Oct-1995   Cancelled treatment:       Reason Eval/Treat Not Completed: Patient unavailable - currently working with PT/OT. Will f/u as able. Per MD, pt ate ~50% of her meal today and RN says she did well.    Mahala Menghini., M.A. CCC-SLP Acute Rehabilitation Services Pager [phone] Office [phone]  05/23/2020, 11:26 AM

## 2020-05-23 NOTE — Progress Notes (Signed)
Occupational Therapy Treatment Patient Details Name: Gail Montgomery MRN: [id number] DOB: 1996-08-25 Today's Date: 05/23/2020    History of present illness 24 yo who had acute R sided weakness and aphasia 05/19/20 due to LVO at [facility]. Screened Covid + but asymptomatic; tPA and underwent thrombectomy; during procedure had ICA vasospasm vs dissection.  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 Pt self extubated 05/20/20.    OT comments  Pt seen in conjunction with PT, and MD in at end of session.  Pt demonstrates increased lethargy this date, and questionably followed 1-3 simple motor commands with max cues/prompting.  She was incontinent loose stool and was assisted with peri care in standing - required total A for hygiene and mod A +2 to stand x 2.  Session limited due to HR increased to 170 so she was returned to supine.  Will continue to follow.   Follow Up Recommendations  CIR;Supervision/Assistance - 24 hour    Equipment Recommendations  3 in 1 bedside commode;Other (comment)    Recommendations for Other Services Rehab consult    Precautions / Restrictions Precautions Precautions: Fall Precaution Comments: right sided weakness (R leg most significantly)       Mobility Bed Mobility Overal bed mobility: Needs Assistance Bed Mobility: Rolling;Sidelying to Sit;Sit to Sidelying Rolling: Max assist Sidelying to sit: Max assist;+2 for physical assistance;+2 for safety/equipment   Sit to supine: Total assist;+2 for physical assistance   General bed mobility comments: pt assisted minimally   Transfers Overall transfer level: Needs assistance Equipment used: 2 person hand held assist Transfers: Sit to/from Stand Sit to Stand: Mod assist;+2 physical assistance         General transfer comment: Requires UE support and assist at buttocks and Rt knee  blocked to move sit to stand     Balance Overall balance assessment: Needs assistance Sitting-balance support: Feet supported;Bilateral upper extremity supported;Single extremity supported Sitting balance-Leahy Scale: Poor Sitting balance - Comments: Pt requires up to mod A to maintain static sitting EOB    Standing balance support: Bilateral upper extremity supported Standing balance-Leahy Scale: Poor Standing balance comment: Pt requires mod A +2 to maintain static standing                            ADL either performed or assessed with clinical judgement   ADL Overall ADL's : Needs assistance/impaired     Grooming: Wash/dry hands;Wash/dry face;Total assistance;Bed level                       Toileting- Clothing Manipulation and Hygiene: Total assistance;Sit to/from stand Toileting - Clothing Manipulation Details (indicate cue type and reason): Pt incontinent of loose stool.  Stood to assist her with peri care - required total A      Functional mobility during ADLs: Moderate assistance;+2 for physical assistance;+2 for safety/equipment       Vision   Additional Comments: Pt keeps eyes closed majority of the time. when eyes opened, she will spontaneously look Rt and Lt and will intermittently fixate on object    Perception     Praxis      Cognition Arousal/Alertness: Lethargic Behavior During Therapy: Flat affect Overall Cognitive Status: Impaired/Different from baseline Area of Impairment: Attention;Following commands                   Current Attention  Level: Focused         Problem Solving: Slow processing;Decreased initiation;Requires tactile cues;Requires verbal cues General Comments: Pt questionably followed 2-3 simple one step motor commands with a delay, but difficult to determine with certainty that she was following commands         Exercises     Shoulder Instructions       General Comments Session limited by HR to 170  while seated EOB after standing for second time. She was returned to supine and MD made aware.  phoned mother at end of session to update her on pt progress for the day     Pertinent Vitals/ Pain       Pain Assessment: Faces Faces Pain Scale: No hurt  Home Living                                          Prior Functioning/Environment              Frequency  Min 2X/week        Progress Toward Goals  OT Goals(current goals can now be found in the care plan section)  Progress towards OT goals: Not progressing toward goals - comment (due to increased lethargy )     Plan Discharge plan remains appropriate    Co-evaluation    PT/OT/SLP Co-Evaluation/Treatment: Yes Reason for Co-Treatment: Complexity of the patient's impairments (multi-system involvement);Necessary to address cognition/behavior during functional activity;For patient/therapist safety;To address functional/ADL transfers   OT goals addressed during session: ADL's and self-care      AM-PAC OT "6 Clicks" Daily Activity     Outcome Measure   Help from another person eating meals?: Total Help from another person taking care of personal grooming?: Total Help from another person toileting, which includes using toliet, bedpan, or urinal?: Total Help from another person bathing (including washing, rinsing, drying)?: Total Help from another person to put on and taking off regular upper body clothing?: Total Help from another person to put on and taking off regular lower body clothing?: Total 6 Click Score: 6    End of Session    OT Visit Diagnosis: Unsteadiness on feet (R26.81);Other abnormalities of gait and mobility (R26.89);Muscle weakness (generalized) (M62.81);Apraxia (R48.2);Other symptoms and signs involving cognitive function;Cognitive communication deficit (R41.841);Other symptoms and signs involving the nervous system (R29.898);Hemiplegia and hemiparesis Symptoms and signs involving  cognitive functions: Cerebral infarction;Nontraumatic SAH Hemiplegia - Right/Left: Right Hemiplegia - dominant/non-dominant: Dominant Hemiplegia - caused by: Cerebral infarction;Nontraumatic SAH   Activity Tolerance Patient limited by lethargy   Patient Left in bed;with call bell/phone within reach   Nurse Communication Mobility status        Time: 7591-6384 OT Time Calculation (min): 66 min  Charges: OT General Charges $OT Visit: 1 Visit OT Treatments $Self Care/Home Management : 8-22 mins $Neuromuscular Re-education: 8-22 mins  Eber Jones., OTR/L Acute Rehabilitation Services Pager [phone] Office [phone]    Jeani Hawking M 05/23/2020, 2:26 PM

## 2020-05-23 NOTE — TOC CAGE-AID Note (Addendum)
Transition of Care [facility]) - CAGE-AID Screening   Patient Details  Name: Gail Montgomery MRN: [id number] Date of Birth: 09-10-1996  Transition of Care [facility]) CM/SW Contact:    Jimmy Picket, [place] Phone Number: 05/23/2020, 11:06 AM   Clinical Narrative:  Pt was unable to participate in assessment due to being oriented to person only. Pt was positive for THC at admission. Pease re consult when pt is oriented.   CAGE-AID Screening: Substance Abuse Screening unable to be completed due to: : Patient unable to participate            [name] Clinical Social Worker [phone]

## 2020-05-23 NOTE — Progress Notes (Signed)
Physical Therapy Treatment Patient Details Name: Gail Montgomery MRN: [id number] DOB: [birth year] Today's Date: 05/23/2020    History of Present Illness 24 yo who had acute R sided weakness and aphasia 05/19/20 due to LVO at [facility]. Screened Covid + but asymptomatic; tPA and underwent thrombectomy; during procedure had ICA vasospasm vs dissection.  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 Pt self extubated 05/20/20.     PT Comments    Co-session with OT with hopes to get pt OOB to recliner chair, however, pt more lethargic today with HR up to 170 in sitting/standing, fever of 103.1 (taken by PT during session both orally and axillary).  Pt following some very basic commands with slow processing, responding best to therapist's initiation of movement and then pt would kick in motoricly  and follow.   She remains appropriate for post acute rehab at discharge.  PT will continue to follow acutely for safe mobility progression.  Follow Up Recommendations  CIR     Equipment Recommendations  Wheelchair (measurements PT);Wheelchair cushion (measurements PT);Hospital bed;3in1 (PT)    Recommendations for Other Services       Precautions / Restrictions Precautions Precautions: Fall Precaution Comments: right sided weakness (R leg most significantly)    Mobility  Bed Mobility Overal bed mobility: Needs Assistance Bed Mobility: Rolling;Supine to Sit;Sit to Supine Rolling: Max assist Sidelying to sit: Max assist;+2 for physical assistance;+2 for safety/equipment   Sit to supine: Total assist;+2 for physical assistance   General bed mobility comments: pt assisted minimally and only after therapist manually initiated movement to command giving extra time to process command.  Pt did attempt to follow through with manual cues, but is lethargic and weak today.    Transfers Overall transfer level: Needs assistance Equipment used: 2 person hand held assist Transfers: Sit to/from Stand Sit to Stand: +2 physical assistance;Mod assist         General transfer comment: Requires UE support and assist at buttocks and Rt knee blocked to move sit to stand total assist for pericare in standing, stood x2.  Pt sway back at times, flopping to the left and then right.    Ambulation/Gait             General Gait Details: unable at this time.  HR increased to 170 bpm in standing (flucturated from high 90s-120s in the bed prior to getting EOB) RN and MD made aware.  Pt also very hot to touch on her trunk (extremities were a bit cold) and temp taken both orally and axillary and was 103.1 for both.  RN and MD made aware of this as well.    Stairs             Wheelchair Mobility    Modified Rankin (Stroke Patients Only) Modified Rankin (Stroke Patients Only) Pre-Morbid Rankin Score: No symptoms Modified Rankin: Severe disability     Balance Overall balance assessment: Needs assistance Sitting-balance support: Feet supported;Bilateral upper extremity supported;Single extremity supported Sitting balance-Leahy Scale: Poor Sitting balance - Comments: Pt requires up to mod A to maintain static sitting EOB    Standing balance support: Bilateral upper extremity supported Standing balance-Leahy Scale: Poor Standing balance comment: Pt requires mod A +2 to maintain static standing                             Cognition Arousal/Alertness: Lethargic  Behavior During Therapy: Flat affect Overall Cognitive Status: Impaired/Different from baseline Area of Impairment: Attention;Following commands                   Current Attention Level: Focused   Following Commands: Follows one step commands with increased time     Problem Solving: Slow processing;Decreased initiation;Requires tactile cues;Requires verbal cues General Comments:  Pt questionably followed 2-3 simple one step motor commands with a delay, but difficult to determine with certainty that she was following commands       Exercises      General Comments General comments (skin integrity, edema, etc.): Session limited by HR to 170 while seated EOB after standing for second time. She was returned to supine and MD made aware.  phoned mother at end of session to update her on pt progress for the day       Pertinent Vitals/Pain Pain Assessment: Faces Faces Pain Scale: No hurt    Home Living                      Prior Function            PT Goals (current goals can now be found in the care plan section) Progress towards PT goals: Not progressing toward goals - comment (more lethargic today, HR up, fever)    Frequency    Min 4X/week      PT Plan Current plan remains appropriate    Co-evaluation PT/OT/SLP Co-Evaluation/Treatment: Yes Reason for Co-Treatment: Complexity of the patient's impairments (multi-system involvement);Necessary to address cognition/behavior during functional activity;For patient/therapist safety;To address functional/ADL transfers PT goals addressed during session: Balance;Mobility/safety with mobility;Strengthening/ROM OT goals addressed during session: ADL's and self-care      AM-PAC PT "6 Clicks" Mobility   Outcome Measure  Help needed turning from your back to your side while in a flat bed without using bedrails?: Total Help needed moving from lying on your back to sitting on the side of a flat bed without using bedrails?: Total Help needed moving to and from a bed to a chair (including a wheelchair)?: Total Help needed standing up from a chair using your arms (e.g., wheelchair or bedside chair)?: A Lot Help needed to walk in hospital room?: Total Help needed climbing 3-5 steps with a railing? : Total 6 Click Score: 7    End of Session   Activity Tolerance: Patient limited by lethargy;Patient limited by  fatigue;Other (comment) (by increased HR with sitting/standing) Patient left: in bed;with call bell/phone within reach;with bed alarm set;with nursing/sitter in room Nurse Communication: Mobility status;Other (comment) (HR increase and fever) PT Visit Diagnosis: Muscle weakness (generalized) (M62.81);Difficulty in walking, not elsewhere classified (R26.2);Hemiplegia and hemiparesis Hemiplegia - Right/Left: Right Hemiplegia - dominant/non-dominant: Dominant Hemiplegia - caused by: Cerebral infarction;Nontraumatic intracerebral hemorrhage     Time: 6546-5035 PT Time Calculation (min) (ACUTE ONLY): 66 min  Charges:  $Therapeutic Activity: 23-37 mins                     Corinna Capra, PT, DPT  Acute Rehabilitation [phone] pager [phone]) [phone] office

## 2020-05-23 NOTE — Plan of Care (Signed)
Patient's T-max 103.1. No obvious source of infection Tylenol given.  Continues to have waxing and waning fever.  Recommendations: -Discussed curbside with the on-call PCCM MD on telemetry consult.  Recommends antibiotics -Pharmacy consult for empiric vancomycin and Zosyn -Blood cultures pending -Obtain urine cultures as well  -- Milon Dikes, MD [facility] Pager: [phone] If 7pm to 7am, please call on call as listed on AMION.

## 2020-05-23 NOTE — Progress Notes (Signed)
Patient having unsustained tachycardia runs up to 130s/140s.  Stroke team aware.

## 2020-05-23 NOTE — Progress Notes (Signed)
STROKE TEAM PROGRESS NOTE   INTERVAL HISTORY PT/OT at bedside. Per RN, pt was awake alert this am with RN and her boyfriend on the video call. However, on my rounding, she was lethargic, with chills, not following commands, and temp 103.1. received tylenol and later checked again, pt more awake alert, waving to provider and both hand thumbs up for provider. CT pending in am.  OBJECTIVE Vitals:   05/23/20 0500 05/23/20 0600 05/23/20 0630 05/23/20 0700  BP: (!) 143/93 137/69  125/77  Pulse: 100 88  66  Resp: 18 20  (!) 24  Temp:   99.2 F (37.3 C)   TempSrc:   Oral   SpO2: 99% 100%  100%  Weight:      Height:       CBC:  Recent Labs  Lab 05/21/20 1220 05/21/20 1220 05/22/20 0356 05/23/20 0900  WBC 10.9*   < > 12.2* 12.5*  NEUTROABS 8.3*  --  8.9*  --   HGB 11.2*   < > 10.4* 11.6*  HCT 35.4*   < > 33.5* 36.2  MCV 91.2   < > 91.8 91.9  PLT 243   < > 209 200   < > = values in this interval not displayed.   Basic Metabolic Panel:  Recent Labs  Lab 05/21/20 0057 05/21/20 1220 05/21/20 2334 05/22/20 0356  NA 148*   < > 152* 150*  K 4.4  --   --  3.2*  CL 119*  --   --  116*  CO2 19*  --   --  22  GLUCOSE 115*  --   --  127*  BUN 8  --   --  6  CREATININE 0.57  --   --  0.54  CALCIUM 8.6*  --   --  8.8*  MG 2.0  --   --  1.8  PHOS 3.2  --   --  3.2   < > = values in this interval not displayed.   Lipid Panel:     Component Value Date/Time   CHOL 156 05/20/2020 0958   TRIG 105 05/20/2020 0958   TRIG 104 05/20/2020 0958   HDL 57 05/20/2020 0958   CHOLHDL 2.7 05/20/2020 0958   VLDL 21 05/20/2020 0958   LDLCALC 78 05/20/2020 0958   HgbA1c:  Lab Results  Component Value Date   HGBA1C 5.6 05/20/2020   Urine Drug Screen:     Component Value Date/Time   LABOPIA NONE DETECTED 05/20/2020 1736   COCAINSCRNUR NONE DETECTED 05/20/2020 1736   LABBENZ NONE DETECTED 05/20/2020 1736   AMPHETMU NONE DETECTED 05/20/2020 1736   THCU POSITIVE (A) 05/20/2020 1736    LABBARB NONE DETECTED 05/20/2020 1736    Al[facility]  [facility] CT HEAD CODE STROKE WO CONTRAST 05/19/2020 Dense left ICA terminus and MCA. No acute hemorrhage. ASPECTS is 10.   CT Code Stroke CTA Head W/WO contrast CT Code Stroke CTA Neck W/WO contrast 05/19/2020 1. 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  2. Mildly indistinct appearance of the proximal left ICA is likely from motion artifact, consider cervical run. No atheromatous changes or vasculopathy seen in the neck.   Neuro Interventional Radiology - Cerebral Angiogram with Intervention - Dr Loreta AveWagner 05/19/20 2:27 PM Left MCA: Baseline TICI 0 First Pass Device:                 Local aspiration and solitaire 4  x 40              Result                         TICI 0 Second Pass Device:           Local aspiration and solitaire 4 x 40              Result                         TICI 3 Left ACA/pericallosal  Baseline TICI 0,    Final TICI 0 pericallosal artery Findings:  Patent right CFA Left M1 occlusion, final is TICI 3  Left ACA A2 occlusion migrated to pericallosal artery during the case.  Could not complete a first pass safely.  Significant irregularity of the cervical ICA, concerning for dissection, that resolved after observation, and once DAPT was initiated.  The final appearance may represent spasm vs FMD/vasculitis  CT HEAD WO CONTRAST 05/19/2020 New large volume of subarachnoid and intraventricular hemorrhage.  CT HEAD WO CONTRAST 05/20/2020 1. Subarachnoid hemorrhage along the anterior interhemispheric fissure with slightly increased extension into both lateral ventricles.  2. Slight increase in size of the temporal horn of the left lateral ventricle consistent with early communicating hydrocephalus.   MR BRAIN WO CONTRAST 05/20/2020 1. Early subacute infarct of the left basal ganglia involving the posterior putamen and caudate body.  2. Subarachnoid  hemorrhage along the interhemispheric fissure extending into the lateral ventricles.  3. 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   CT HEAD WO CONTRAST 05/21/2020 1. Stable appearance of left basal ganglia nonhemorrhagic infarction. 2. Stable medial left frontal extra-axial hemorrhage, extending into the ventricles bilaterally. 3. Subarachnoid hemorrhage posteriorly on the left and along the inter cerebral hemisphere likely represents redistribution without significant new hemorrhage. 4. Hemorrhage is present within the aqueduct of Sylvius. 5. Progressive dilation of the ventricles compatible with developing hydrocephalus. 6. No new areas of hemorrhage.   CT HEAD WO CONTRAST 05/22/2020 1. Regressed intraventricular hemorrhage since yesterday with small volume residual. Stable mild ventriculomegaly, and possible mild transependymal edema. 2. Stable left inferior frontal gyrus parasagittal hematoma (approximately 12 mL) and regional edema. No significant midline shift, basilar cisterns are patent. 3. Stable left basal ganglia infarcts. 4. No new intracranial abnormality.   Transthoracic Echocardiogram  1. Left ventricular ejection fraction, by estimation, is 60 to 65%. The left ventricle has normal function. The left ventricle has no regional wall motion abnormalities. Left ventricular diastolic parameters were normal.  2. Right ventricular systolic function is normal. The right ventricular size is normal.  3. The mitral valve is normal in structure. Trivial mitral valve regurgitation. No evidence of mitral stenosis.  4. The aortic valve is normal in structure. Aortic valve regurgitation is not visualized. No aortic stenosis is present.  5. The inferior vena cava is normal in size with greater than 50% respiratory variability, suggesting right atrial pressure of 3 mmHg.   ECHOCARDIOGRAM COMPLETE 05/20/2020 1. Left ventricular ejection  fraction, by estimation, is 60 to 65%. The left ventricle has normal function. The left ventricle has no regional wall motion abnormalities. Left ventricular diastolic parameters were normal.   2. Right ventricular systolic function is normal. The right ventricular size is normal.   3. The mitral valve is normal in structure. Trivial mitral valve regurgitation. No evidence of mitral stenosis.  4. The aortic valve is normal in structure. Aortic valve regurgitation is not visualized. No aortic stenosis is present.   5. The inferior vena cava is normal in size with greater than 50% respiratory variability, suggesting right atrial pressure of 3 mmHg.   Bilateral Lower Extremity Venous Dopplers  05/21/2020 RIGHT: - There is no evidence of deep vein thrombosis in the lower extremity. However, portions of this examination were limited- see technologist comments above.  - No cystic structure found in the popliteal fossa.   LEFT: - There is no evidence of deep vein thrombosis in the lower extremity. However, portions of this examination were limited- see technologist comments above.  - No cystic structure found in the popliteal fossa.     PHYSICAL EXAM   Temp:  [98.8 F (37.1 C)-101.2 F (38.4 C)] 99.2 F (37.3 C) (08/25 0630) Pulse Rate:  [64-100] 66 (08/25 0700) Resp:  [15-25] 24 (08/25 0700) BP: (100-145)/(60-130) 125/77 (08/25 0700) SpO2:  [99 %-100 %] 100 % (08/25 0700)  General - Well nourished, well developed, lethargic with chills.  Ophthalmologic - fundi not visualized due to noncooperation.   Cardiovascular - irregular rhythm, on tele showed sinus arrythmia.  Neuro - lethargic with chills, able to open eyes on voice but needed repetitive stimulation to keep eyes open. Nonverbal, not able to name or repeat, did not follow simple commands. Able to have bilateral gaze with voice on both sides. PERRL. Blinking to visual threat bilaterally. Mild right facial droop. Tongue protrusion not  cooperative. LUE spontaneous movement and LLE strong withdraw to pain. RUE proximal 3-/5, distal finger grip 3/5. RLE mild withdraw to pain. Sensation, coordination not cooperative and gait not tested.   ASSESSMENT/PLAN Ms. Gail Montgomery is a 24 y.o. female with history of tobacco use, anxiety (panic attacks) and hormonal birth control presented with right-sided weakness and aphasia that started abruptly at 9:30 AM.  COVID - positive. IV t-PA - Saturday 05/19/20 at 1145. Thrombectomy - Dr Loreta Ave - Left M1 occlusion, final is TICI 3.   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 Paradoxical emboli vs. COVID hypercoagulability in the setting of OCP use  CT Head - Dense left ICA terminus and MCA. No acute hemorrhage. ASPECTS is 10.      CTA H&N - Emergent large vessel occlusion at the left ICA terminus continuing into the MCA. Mildly indistinct appearance of the proximal left ICA is likely from motion artifact, consider cervical run. No atheromatous changes or vasculopathy seen in the neck.  IR - left MCA and A2 occlusion s/p TICI3 of MCA and TICI0 of ACA. Initially left ICA stenosis post IR but resolved on the final run  CT head 8/21 - New large volume of subarachnoid and intraventricular hemorrhage.   CT head 8/22 [facility] along the anterior interhemispheric fissure with slightly increased extension into both lateral ventricles. Slight increase in size of the temporal horn of the left lateral ventricle consistent with early communicating hydrocephalus.   MRI head 8/22 -  Early subacute infarct of the left basal ganglia involving the posterior putamen and caudate body.  CT head 8/23 - stable L basal ganglia infarct, ICH/IVH/SAH. Progressive dilation of ventricles. CT head 8/24 - regressed IVH. Stable infarct and frontal ICH. Stable  ventriculomegaly LE Venous Dopplers - no DVT  2D Echo EF 60-65%  Will do TCD bubble study once more cooperative  [employer] Virus 2 - positive  LDL - 78  HgbA1c 5.6  Hypercoagulable and autoimmune labs pending  UDS - THC positive  VTE prophylaxis - SCDs  No antithrombotic prior to admission, now on No antithrombotic due to [facility] and IVH  Ongoing aggressive stroke risk factor management  Therapy recommendations:  CIR (CIR cannot take until >20d from positive test or 2 negative tests)  Disposition:  Pending (will be difficult to place a 24yo in SNF for rehab)  Hydrocephalus, mild  CT head showed mildly increased size of left temporal horn  CT repeat stable hemorrhage but mild progression of hydrocephalus  CT head 8/24 - regressed IVH. Stable infarct and frontal ICH. Stable mild ventriculomegaly  NSG on board  3% hypertonic saline - 50 cc/hr ->25cc->10cc -> off  Na - 139->149->154->148->152->150->140   PICC placed 8/23   Repeat CT on 8/26  BP management  Stable not on IV BP meds  On Nimodipine for [facility] - discussed with Dr. Conchita Paris, will keep nimodipine while inpt - d/c on discharge if neuro stable  BP goal < 160 given SAH and IVH  Close BP monitoring . Long-term BP goal normotensive  Hyperlipidemia  Home Lipid lowering medication: none   LDL 78, goal < 70  No statin at this time due to [facility] and IVH  Consider low dose statin at discharge  ? Hypercoagulable state   COVID positive - asymptomatic  D-dimer > 20->16.13->17.44-> >20-> >20   OCP user with smoking and THC use  COVID - 19 infection  COVID + test 8/21  Asymptomatic  Ferritin 24->30->37->51->93   Fibrinogen 289  CRP 0.8->2.4->3.3->3.9->8.7   D-dimer > 20->16.13->17.44-> >20-> >20   COVID Ab titer > 2500  Per ID, ok to be off isolation in 10 days following + test as asymptomatic (05/29/2020)  Leukocytosis, Fever, Tachycardia  Etiology unknown  TMax 101.2->103.1  WBC  10.9->12.2->12.5  UA small LE, rare bact, 0-5 WBC - neg  CXR neg  Urine culture pending  Blood Cx pending   HR 140-150s 8/25 (transient), regular  On vanco and zosyn empiric treatment  Euthyroid sick syndrome  Low TSH 0.054   normal free T4 and T3  Likely related to critical illness  Tobacco abuse  Intermittent smoking per mom  Smoking cessation counseling will be provided  Other Stroke Risk Factors  ETOH use, advised to drink no more than 1 alcoholic beverage per day  Obesity, Body mass index is 31.86 kg/m., recommend weight loss, diet and exercise as appropriate   Hormonal birth control - both progesterone and estrogen - Estarylla  Other Active Problems  Code status - Full code  Mild anemia 11.2->10.4->11.6  Hypokalemia 3.3->4.4->3.2 - supplement ->3.4   Low normal B12 level 300 - supplement  Hospital day # 4  This patient is critically ill due to COVID infection, fever, leukocytosis, SAH and IVH, left ICA occlusion with left MCA infarcts, hydrocephalus and at significant risk of neurological worsening, death form sepsis, obstructive hydrocephalus, recurrent stroke, hemorrhagic conversion, heart failure, seizure. This patient's care requires constant monitoring of vital signs, hemodynamics, respiratory and cardiac monitoring, review of multiple databases, neurological assessment, discussion with family, other specialists and medical decision making of high complexity. I spent 40 minutes of neurocritical care time in the care of this patient. I briefly updated mom via phone call today.   Marvel Plan, MD PhD Stroke Neurology 05/23/2020 9:15 AM  To contact Stroke Continuity provider, please refer to [email or web address] After hours, contact General Neurology

## 2020-05-23 NOTE — Progress Notes (Signed)
Dr. Wilford Corner with neurology paged, 3 different lab technicians attempted to stick patient, even in the foot, for 6pm sodium lab draws and were unsuccessful. Verbal order to get IV team to place a PICC line for lab draws. IV team consulted, verbal phone consent obtained by patient's mother by this RN and IV team RN. Once PICC was placed, labs were drawn by the nurse around midnight.

## 2020-05-23 NOTE — Progress Notes (Signed)
ID PROGRESS NOTE  Patient admitted for stroke but asymptomatic for covid. Since no respiratory symptoms she can be off of isolation in 10 days since + test on 8/21.   21 day isolation is for patients who have severe covid illness or immunosuppressed for which she is neither.  If questions, can call me at [phone] for discussion on covid isolation policy  [name] MD MPH [facility] [phone]

## 2020-05-23 NOTE — Progress Notes (Signed)
  Speech Language Pathology Treatment: Dysphagia;Cognitive-Linquistic  Patient Details Name: Gail Montgomery MRN: [id number] DOB: June 10, 1996 Today's Date: 05/23/2020 Time: 8127-5170 SLP Time Calculation (min) (ACUTE ONLY): 23 min  Assessment / Plan / Recommendation Clinical Impression  Pt was seen with part of her lunch tray, exhibiting moderate amounts of buccal pocketing bilaterally with medium-large sized bites. SLP provided cues for lingual sweep and additional bites of puree, but with more effective clearance when given a liquid wash. Pt had much less oral residue when food was cut up more finely. Recommend adjusting diet to Dys 2 (chopped) textures for now. Throughout session pt also followed several one-step commands, although at times needing repetition. She said one clear phrase but with no other verbalizations despite cues. She did not her head in response to a few yes/no questions and used other gestures x1. Attempted to provide familiar music to encourage verbal output and although pt did not verbalize (or vocalize) to music, she did tap her hand/finger along to it with SLP prompting. Continue to recommend intensive SLP follow up.    HPI HPI: Pt is a 24 yo female presenting with sudden onset R sided weakness and aphasia. 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 She was intubated for thrombectomy 8/21. 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 MRI showed early subacute infarct of the left basal ganglia involving the posterior putamen and caudate body. Pt self-extubated 8/22. Pt tested (+) for COVID-19 but was asymptomatic upon arrival. PMH: panic attack, anxiety      SLP Plan  Continue with current plan of care       Recommendations  Diet recommendations: Dysphagia 2 (fine chop);Thin liquid Liquids provided via: Cup;Straw Medication Administration: Crushed with puree Supervision: Staff to assist with self feeding;Full supervision/cueing for compensatory  strategies Compensations: Slow rate;Small sips/bites Postural Changes and/or Swallow Maneuvers: Seated upright 90 degrees                Oral Care Recommendations: Oral care BID Follow up Recommendations: Inpatient Rehab SLP Visit Diagnosis: Dysphagia, oral phase (R13.11) Plan: Continue with current plan of care       GO                Mahala Menghini., M.A. CCC-SLP Acute Rehabilitation Services Pager [phone] Office [phone]  05/23/2020, 3:06 PM

## 2020-05-23 NOTE — Progress Notes (Signed)
Assisted tele visit to patient with boyfriend.  Decker Cogdell M Ingri Diemer, RN   

## 2020-05-24 ENCOUNTER — Inpatient Hospital Stay (HOSPITAL_COMMUNITY): Payer: BC Managed Care – PPO

## 2020-05-24 DIAGNOSIS — Z9282 Status post administration of tPA (rtPA) in a different facility within the last 24 hours prior to admission to current facility: Secondary | ICD-10-CM

## 2020-05-24 LAB — CULTURE, BLOOD (ROUTINE X 2)
Culture: NO GROWTH
Culture: NO GROWTH
Special Requests: ADEQUATE

## 2020-05-24 LAB — BASIC METABOLIC PANEL
Anion gap: 11 (ref 5–15)
BUN: 9 mg/dL (ref 6–20)
CO2: 21 mmol/L — ABNORMAL LOW (ref 22–32)
Calcium: 8.4 mg/dL — ABNORMAL LOW (ref 8.9–10.3)
Chloride: 107 mmol/L (ref 98–111)
Creatinine, Ser: 0.62 mg/dL (ref 0.44–1.00)
GFR calc Af Amer: >60 mL/min (ref >60)
GFR calc non Af Amer: >60 mL/min (ref >60)
Glucose, Bld: 133 mg/dL — ABNORMAL HIGH (ref 70–99)
Potassium: 3.1 mmol/L — ABNORMAL LOW (ref 3.5–5.1)
Sodium: 139 mmol/L (ref 135–145)

## 2020-05-24 LAB — URINE CULTURE: Culture: NO GROWTH

## 2020-05-24 LAB — BETA-2-GLYCOPROTEIN I ABS, IGG/M/A
Beta-2 Glyco I IgG: <9 GPI IgG units (ref 0–20)
Beta-2-Glycoprotein I IgA: <9 GPI IgA units (ref 0–25)
Beta-2-Glycoprotein I IgM: <9 GPI IgM units (ref 0–32)

## 2020-05-24 LAB — CBC
HCT: 35.6 % — ABNORMAL LOW (ref 36.0–46.0)
Hemoglobin: 11.4 g/dL — ABNORMAL LOW (ref 12.0–15.0)
MCH: 29 pg (ref 26.0–34.0)
MCHC: 32 g/dL (ref 30.0–36.0)
MCV: 90.6 fL (ref 80.0–100.0)
Platelets: 165 10*3/uL (ref 150–400)
RBC: 3.93 MIL/uL (ref 3.87–5.11)
RDW: 12.9 % (ref 11.5–15.5)
WBC: 11 10*3/uL — ABNORMAL HIGH (ref 4.0–10.5)
nRBC: 0 % (ref 0.0–0.2)

## 2020-05-24 LAB — C-REACTIVE PROTEIN: CRP: 9.1 mg/dL — ABNORMAL HIGH (ref <1.0)

## 2020-05-24 LAB — FANA STAINING PATTERNS: Speckled Pattern: 24529

## 2020-05-24 LAB — FERRITIN: Ferritin: 92 ng/mL (ref 11–307)

## 2020-05-24 LAB — ANTINUCLEAR ANTIBODIES, IFA: ANA Ab, IFA: POSITIVE — AB

## 2020-05-24 LAB — D-DIMER, QUANTITATIVE: D-Dimer, Quant: >20 ug/mL-FEU — ABNORMAL HIGH (ref 0.00–0.50)

## 2020-05-24 MED ORDER — POTASSIUM CHLORIDE 20 MEQ PO PACK
40.0000 meq | PACK | Freq: Once | ORAL | Status: AC
  Administered 2020-05-24: 40 meq via ORAL
  Filled 2020-05-24: qty 2

## 2020-05-24 NOTE — Plan of Care (Signed)
  Problem: Intracerebral Hemorrhage Tissue Perfusion: Goal: Complications of Intracerebral Hemorrhage will be minimized Outcome: Progressing   Problem: Ischemic Stroke/TIA Tissue Perfusion: Goal: Complications of ischemic stroke/TIA will be minimized Outcome: Progressing   Problem: Spontaneous Subarachnoid Hemorrhage Tissue Perfusion: Goal: Complications of Spontaneous Subarachnoid Hemorrhage will be minimized Outcome: Progressing   Problem: Clinical Measurements: Goal: Will remain free from infection Outcome: Progressing   Problem: Nutrition: Goal: Adequate nutrition will be maintained Outcome: Progressing   Problem: Safety: Goal: Ability to remain free from injury will improve Outcome: Progressing   Problem: Skin Integrity: Goal: Risk for impaired skin integrity will decrease Outcome: Progressing

## 2020-05-24 NOTE — Progress Notes (Signed)
Inpatient Rehabilitation Admissions Coordinator  Noted ID consultation with recommendation for isolation length. We will follow.  Ottie Glazier, RN, MSN Rehab Admissions Coordinator [phone] 05/24/2020 8:34 AM

## 2020-05-24 NOTE — Progress Notes (Signed)
STROKE TEAM PROGRESS NOTE   INTERVAL HISTORY Pt RN at bedside. Pt still lethargic but more awake alert and interactive today. Able to follow commands on both hands, right UE strength much improved.  She was able to repeat sentences for me.  Still has intermittent fever, started on vancomycin and Zosyn last night.  This morning T-max 100.3.  Repeat CT unchanged.  OBJECTIVE Vitals:   05/24/20 0900 05/24/20 1000 05/24/20 1100 05/24/20 1200  BP: (!) 121/105 (!) 125/101 (!) 105/58 108/66  Pulse:  (!) 110  (!) 107  Resp: (!) 26 (!) 30 (!) 22 (!) 26  Temp:    99.1 F (37.3 C)  TempSrc:      SpO2:  100%  100%  Weight:      Height:       CBC:  Recent Labs  Lab 05/21/20 1220 05/21/20 1220 05/22/20 0356 05/22/20 0356 05/23/20 0900 05/24/20 0524  WBC 10.9*   < > 12.2*   < > 12.5* 11.0*  NEUTROABS 8.3*  --  8.9*  --   --   --   HGB 11.2*   < > 10.4*   < > 11.6* 11.4*  HCT 35.4*   < > 33.5*   < > 36.2 35.6*  MCV 91.2   < > 91.8   < > 91.9 90.6  PLT 243   < > 209   < > 200 165   < > = values in this interval not displayed.   Basic Metabolic Panel:  Recent Labs  Lab 05/21/20 0057 05/21/20 1220 05/22/20 0356 05/22/20 0356 05/23/20 0900 05/24/20 0524  NA 148*   < > 150*   < > 140 139  K 4.4  --  3.2*   < > 3.4* 3.1*  CL 119*  --  116*   < > 107 107  CO2 19*  --  22   < > 22 21*  GLUCOSE 115*  --  127*   < > 118* 133*  BUN 8  --  6   < > 9 9  CREATININE 0.57  --  0.54   < > 0.57 0.62  CALCIUM 8.6*  --  8.8*   < > 9.0 8.4*  MG 2.0  --  1.8  --   --   --   PHOS 3.2  --  3.2  --   --   --    < > = values in this interval not displayed.   Lipid Panel:     Component Value Date/Time   CHOL 156 05/20/2020 0958   TRIG 105 05/20/2020 0958   TRIG 104 05/20/2020 0958   HDL 57 05/20/2020 0958   CHOLHDL 2.7 05/20/2020 0958   VLDL 21 05/20/2020 0958   LDLCALC 78 05/20/2020 0958   HgbA1c:  Lab Results  Component Value Date   HGBA1C 5.6 05/20/2020   Urine Drug Screen:      Component Value Date/Time   LABOPIA NONE DETECTED 05/20/2020 1736   COCAINSCRNUR NONE DETECTED 05/20/2020 1736   LABBENZ NONE DETECTED 05/20/2020 1736   AMPHETMU NONE DETECTED 05/20/2020 1736   THCU POSITIVE (A) 05/20/2020 1736   LABBARB NONE DETECTED 05/20/2020 1736    Alcohol Level No results found for: [facility]  IMAGING CT HEAD CODE STROKE WO CONTRAST 05/19/2020 Dense left ICA terminus and MCA. No acute hemorrhage. ASPECTS is 10.   CT Code Stroke CTA Head W/WO contrast CT Code Stroke CTA Neck W/WO contrast 05/19/2020 1. Emergent large vessel occlusion  at the left ICA terminus continuing into the MCA.  2. Mildly indistinct appearance of the proximal left ICA is likely from motion artifact, consider cervical run. No atheromatous changes or vasculopathy seen in the neck.   Neuro Interventional Radiology - Cerebral Angiogram with Intervention - Dr Loreta Ave 05/19/20 2:27 PM Left MCA: Baseline TICI 0 First Pass Device:                 Local aspiration and solitaire 4 x 40              Result                         TICI 0 Second Pass Device:           Local aspiration and solitaire 4 x 40              Result                         TICI 3 Left ACA/pericallosal  Baseline TICI 0,    Final TICI 0 pericallosal artery Findings:  Patent right CFA Left M1 occlusion, final is TICI 3  Left ACA A2 occlusion migrated to pericallosal artery during the case.  Could not complete a first pass safely.  Significant irregularity of the cervical ICA, concerning for dissection, that resolved after observation, and once DAPT was initiated.  The final appearance may represent spasm vs FMD/vasculitis  CT HEAD WO CONTRAST 05/19/2020 New large volume of subarachnoid and intraventricular hemorrhage.  CT HEAD WO CONTRAST 05/20/2020 1. Subarachnoid hemorrhage along the anterior interhemispheric fissure with slightly increased extension into both lateral ventricles.  2. Slight increase in size of the temporal horn of  the left lateral ventricle consistent with early communicating hydrocephalus.   MR BRAIN WO CONTRAST 05/20/2020 1. Early subacute infarct of the left basal ganglia involving the posterior putamen and caudate body.  2. Subarachnoid hemorrhage along the interhemispheric fissure extending into the lateral ventricles.  3. 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   CT HEAD WO CONTRAST 05/21/2020 1. Stable appearance of left basal ganglia nonhemorrhagic infarction. 2. Stable medial left frontal extra-axial hemorrhage, extending into the ventricles bilaterally. 3. Subarachnoid hemorrhage posteriorly on the left and along the inter cerebral hemisphere likely represents redistribution without significant new hemorrhage. 4. Hemorrhage is present within the aqueduct of Sylvius. 5. Progressive dilation of the ventricles compatible with developing hydrocephalus. 6. No new areas of hemorrhage.   CT HEAD WO CONTRAST 05/22/2020 1. Regressed intraventricular hemorrhage since yesterday with small volume residual. Stable mild ventriculomegaly, and possible mild transependymal edema. 2. Stable left inferior frontal gyrus parasagittal hematoma (approximately 12 mL) and regional edema. No significant midline shift, basilar cisterns are patent. 3. Stable left basal ganglia infarcts. 4. No new intracranial abnormality.   Transthoracic Echocardiogram  1. Left ventricular ejection fraction, by estimation, is 60 to 65%. The left ventricle has normal function. The left ventricle has no regional wall motion abnormalities. Left ventricular diastolic parameters were normal.  2. Right ventricular systolic function is normal. The right ventricular size is normal.  3. The mitral valve is normal in structure. Trivial mitral valve regurgitation. No evidence of mitral stenosis.  4. The aortic valve is normal in structure. Aortic valve regurgitation is not visualized. No aortic  stenosis is present.  5. The inferior vena cava is normal in size with greater than 50%  respiratory variability, suggesting right atrial pressure of 3 mmHg.   ECHOCARDIOGRAM COMPLETE 05/20/2020 1. Left ventricular ejection fraction, by estimation, is 60 to 65%. The left ventricle has normal function. The left ventricle has no regional wall motion abnormalities. Left ventricular diastolic parameters were normal.   2. Right ventricular systolic function is normal. The right ventricular size is normal.   3. The mitral valve is normal in structure. Trivial mitral valve regurgitation. No evidence of mitral stenosis.   4. The aortic valve is normal in structure. Aortic valve regurgitation is not visualized. No aortic stenosis is present.   5. The inferior vena cava is normal in size with greater than 50% respiratory variability, suggesting right atrial pressure of 3 mmHg.   Bilateral Lower Extremity Venous Dopplers  05/21/2020 RIGHT: - There is no evidence of deep vein thrombosis in the lower extremity. However, portions of this examination were limited- see technologist comments above.  - No cystic structure found in the popliteal fossa.   LEFT: - There is no evidence of deep vein thrombosis in the lower extremity. However, portions of this examination were limited- see technologist comments above.  - No cystic structure found in the popliteal fossa.     PHYSICAL EXAM   Temp:  [98.8 F (37.1 C)-103.1 F (39.5 C)] 99.1 F (37.3 C) (08/26 1200) Pulse Rate:  [71-131] 107 (08/26 1200) Resp:  [15-30] 26 (08/26 1200) BP: (99-141)/(56-105) 108/66 (08/26 1200) SpO2:  [100 %] 100 % (08/26 1200)  General - Well nourished, well developed, lethargic  Ophthalmologic - fundi not visualized due to noncooperation.   Cardiovascular - regular rhythm but tachycardia  Neuro - lethargic but more awake alert and interactive today, eyes open, no spontaneous speech, however able to repeat sentences, able to name  3/3.  Able to follow simple commands on both hands and left foot. Able to have bilateral gaze with voice on both sides, however right gaze incomplete, still has left gaze preference. PERRL. Blinking to visual threat bilaterally. Mild right facial droop. Tongue protrusion not cooperative. LUE spontaneous movement and LLE strong withdraw to pain. RUE proximal 3+/5, distal finger grip 3+/5. RLE mild withdraw to pain 2/5. Sensation, coordination not cooperative and gait not tested.   ASSESSMENT/PLAN Ms. Gail Montgomery is a 24 y.o. female with history of tobacco use, anxiety (panic attacks) and hormonal birth control presented with right-sided weakness and aphasia that started abruptly at 9:30 AM.  COVID - positive. IV t-PA - Saturday 05/19/20 at 1145. Thrombectomy - Dr Loreta AveWagner - Left M1 occlusion, final is TICI 3.   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 Paradoxical emboli vs. COVID hypercoagulability in the setting of OCP use  CT Head - Dense left ICA terminus and MCA. No acute hemorrhage. ASPECTS is 10.      CTA H&N - Emergent large vessel occlusion at the left ICA terminus continuing into the MCA. Mildly indistinct appearance of the proximal left ICA is likely from motion artifact, consider cervical run. No atheromatous changes or vasculopathy seen in the neck.  IR - left MCA and A2 occlusion s/p TICI3 of MCA and TICI0 of ACA. Initially left ICA stenosis post IR but resolved on the final run  CT head 8/21 - New large volume of subarachnoid and intraventricular hemorrhage.   CT head 8/22 -  SAH along the anterior interhemispheric fissure with slightly increased extension into both lateral ventricles. Slight increase in size of the temporal horn of the left lateral ventricle consistent with early communicating hydrocephalus.   MRI head  8/22 -  Early subacute infarct of the left basal ganglia involving the posterior putamen and caudate body.  CT head 8/23 - stable L basal ganglia infarct, ICH/IVH/SAH. Progressive dilation of ventricles. CT head 8/24 - regressed IVH. Stable infarct and frontal ICH. Stable ventriculomegaly CT head 8/26 - stable infarcts & hemorrhage w/ mild ventriculomegaly  LE Venous Dopplers - no DVT  2D Echo EF 60-65%  TCD bubble study pending in am  [employer] Virus 2 - positive  LDL - 78  HgbA1c 5.6  Hypercoagulable and autoimmune labs ANCA proteinase 3 ab 6.0 (h) ANA positive - titer pending  UDS - THC positive  VTE prophylaxis - SCDs  No antithrombotic prior to admission, now on No antithrombotic due to [facility] and IVH  Ongoing aggressive stroke risk factor management  Therapy recommendations:  CIR   Disposition:  Pending   Hydrocephalus, mild  CT head showed mildly increased size of left temporal horn  CT repeat stable hemorrhage but mild progression of hydrocephalus  CT head 8/24 - regressed IVH. Stable infarct and frontal ICH. Stable mild ventriculomegaly  NSG on board  3% hypertonic saline - 50 cc/hr ->25cc->10cc -> off  Na - 139->149->154->148->152->150->140->139   PICC placed 8/23   Repeat CT on 8/26 stable  BP management  Stable not on IV BP meds  On Nimodipine for [facility] - discussed with Dr. Conchita Paris, will keep nimodipine while inpt - d/c on discharge if neuro stable  BP goal < 160 given SAH and IVH  Close BP monitoring . Long-term BP goal normotensive  Hyperlipidemia  Home Lipid lowering medication: none   LDL 78, goal < 70  No statin at this time due to [facility] and IVH  Consider low dose statin at discharge  ? Hypercoagulable state   COVID positive - asymptomatic  D-dimer > 20->16.13->17.44-> >20-> >20   OCP user with smoking and THC use  Hypercoagulable work up so far + ANCA proteinase 3 Ab and ANA - titer pending  COVID - 19 infection  COVID +  test 8/21  Asymptomatic  Ferritin 24->30->37->51->93->92  Fibrinogen 289  CRP 0.8->2.4->3.3->3.9->8.7->9.1   D-dimer > 20->16.13->17.44-> >20-> >20 -> >20   COVID Ab titer > 2500  Per ID, ok to be off isolation in 10 days following + test as asymptomatic (05/29/2020)  Leukocytosis, Fever, Tachycardia  Etiology unknown  TMax 101.2->103.1->100.1  WBC 10.9->12.2->12.5->11.0  UA small LE, rare bact, 0-5 WBC - neg  CXR neg  Urine culture pending  Blood Cx NG < 12h  HR 140-150s 8/25 (transient), regular  On vanco and zosyn empiric treatment 8/25>>  Euthyroid sick syndrome  Low TSH 0.054   normal free T4 and T3  Likely related to critical illness  Tobacco abuse  Intermittent smoking per mom  Smoking cessation counseling will be provided  Other Stroke Risk Factors  ETOH use, advised to drink no more than 1 alcoholic beverage per day  Obesity, Body mass index is 31.86 kg/m., recommend weight loss, diet and exercise as appropriate   Hormonal birth control - both progesterone and estrogen - Estarylla  Other Active Problems  Code status - Full code  Mild anemia 11.2->10.4->11.6->11.4  Hypokalemia 3.3->4.4->3.2 ->3.4 ->3.1 - supplement   Low normal B12 level 300 - supplement  Hospital day #  5  This patient is critically ill due to left ICA occlusion, status post TPA and thrombectomy, SAH, IVH, fever and leukocytosis, hydrocephalus, Covid infection and at significant risk of neurological worsening, death form recurrent stroke, hemorrhagic conversion, hematoma expansion, sepsis, obstructive hydrocephalus, brain herniation, heart failure. This patient's care requires constant monitoring of vital signs, hemodynamics, respiratory and cardiac monitoring, review of multiple databases, neurological assessment, discussion with family, other specialists and medical decision making of high complexity. I spent 35 minutes of neurocritical care time in the care of this  patient. I had long discussion with mom at bedside, updated pt current condition, treatment plan and potential prognosis, and answered all the questions. She expressed understanding and appreciation. I also discussed with Dr. Denese Killings.    Marvel Plan, MD PhD Stroke Neurology 05/24/2020 12:57 PM   To contact Stroke Continuity provider, please refer to [email or web address]. After hours, contact General Neurology

## 2020-05-24 NOTE — Progress Notes (Signed)
Assisted tele visit to patient with mother.  Kelso Bibby M Hannan Hutmacher, RN   

## 2020-05-24 NOTE — Progress Notes (Signed)
Physical Therapy Treatment Patient Details Name: Gail Montgomery MRN: [id number] DOB: 1996/01/20 Today's Date: 05/24/2020    History of Present Illness 24 yo who had acute R sided weakness and aphasia 05/19/20 due to LVO at [facility]. Screened Covid + but asymptomatic; tPA and underwent thrombectomy; during procedure had ICA vasospasm vs dissection.  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 Pt self extubated 05/20/20.     PT Comments    Pt remains limited by tachycardia and communication deficits. Pt inconsistently able to communicate with head nods and gestures during session, most gestures seems to be the pt copying gestures which this PT utilizes during session. Pt continues to require significant physical assistance to perform all functional mobility tasks, some improvement with tactile cues and initiation. Pt will continues to benefit from acute PT POC as well as CIR at the time of discharge to improve mobility quality and to reduce falls risk.   Follow Up Recommendations  CIR     Equipment Recommendations  Wheelchair (measurements PT);Wheelchair cushion (measurements PT);Hospital bed;3in1 (PT)    Recommendations for Other Services       Precautions / Restrictions Precautions Precautions: Fall Precaution Comments: right sided weakness (R leg most significantly) Restrictions Weight Bearing Restrictions: No    Mobility  Bed Mobility Overal bed mobility: Needs Assistance Bed Mobility: Rolling;Supine to Sit;Sit to Supine Rolling: Max assist   Supine to sit: Max assist Sit to supine: Total assist      Transfers Overall transfer level: Needs assistance Equipment used: 1 person hand held assist Transfers: Sit to/from Stand Sit to Stand: Max assist         General transfer comment: PT provides L knee block and BUE support  Ambulation/Gait                  Stairs             Wheelchair Mobility    Modified Rankin (Stroke Patients Only) Modified Rankin (Stroke Patients Only) Pre-Morbid Rankin Score: No symptoms Modified Rankin: Severe disability     Balance Overall balance assessment: Needs assistance Sitting-balance support: Bilateral upper extremity supported;Feet supported Sitting balance-Leahy Scale: Poor Sitting balance - Comments: mod-maxA due to posterior lean   Standing balance support: Bilateral upper extremity supported Standing balance-Leahy Scale: Zero Standing balance comment: max-totalA for all standing activity                            Cognition Arousal/Alertness: Awake/alert Behavior During Therapy: Flat affect Overall Cognitive Status: Impaired/Different from baseline Area of Impairment: Attention;Following commands;Safety/judgement;Awareness;Problem solving                   Current Attention Level: Focused   Following Commands: Follows one step commands inconsistently;Follows one step commands with increased time     Problem Solving: Slow processing;Requires verbal cues;Requires tactile cues;Difficulty sequencing General Comments: delayed command following, does better with initiation and demonstration      Exercises      General Comments General comments (skin integrity, edema, etc.): pt tachy in 110s initiall and up to 169 during standing. PT returns pt to supine after becoming extremely tachy, upon which her HR returns to 120s, RN made aware      Pertinent Vitals/Pain Pain Assessment: Faces Faces Pain Scale: Hurts even more Pain Location: generalized Pain Descriptors / Indicators: Grimacing Pain Intervention(s): Monitored during session  Home Living                      Prior Function            PT Goals (current goals can now be found in the care plan section) Acute Rehab PT Goals Patient Stated Goal: mother would like her to return  home with mom at d/c Progress towards PT goals: Not progressing toward goals - comment (limited by tachycardia)    Frequency    Min 4X/week      PT Plan Current plan remains appropriate    Co-evaluation              AM-PAC PT "6 Clicks" Mobility   Outcome Measure  Help needed turning from your back to your side while in a flat bed without using bedrails?: Total Help needed moving from lying on your back to sitting on the side of a flat bed without using bedrails?: Total Help needed moving to and from a bed to a chair (including a wheelchair)?: Total Help needed standing up from a chair using your arms (e.g., wheelchair or bedside chair)?: Total Help needed to walk in hospital room?: Total Help needed climbing 3-5 steps with a railing? : Total 6 Click Score: 6    End of Session   Activity Tolerance: Treatment limited secondary to medical complications (Comment) (tachycardia) Patient left: in bed;with call bell/phone within reach;with bed alarm set Nurse Communication: Mobility status PT Visit Diagnosis: Muscle weakness (generalized) (M62.81);Difficulty in walking, not elsewhere classified (R26.2);Hemiplegia and hemiparesis Hemiplegia - Right/Left: Right Hemiplegia - dominant/non-dominant: Dominant Hemiplegia - caused by: Cerebral infarction;Nontraumatic intracerebral hemorrhage     Time: 1610-9604 PT Time Calculation (min) (ACUTE ONLY): 31 min  Charges:  $Therapeutic Activity: 23-37 mins                     Arlyss Gandy, PT, DPT Acute Rehabilitation Pager: [phone]    Arlyss Gandy 05/24/2020, 3:55 PM

## 2020-05-24 NOTE — Progress Notes (Signed)
Assisted tele visit to patient with mother.

## 2020-05-24 NOTE — Progress Notes (Addendum)
  Speech Language Pathology Treatment: Cognitive-Linquistic  Patient Details Name: Gail Montgomery MRN: [id number] DOB: 21-Aug-1996 Today's Date: 05/24/2020 Time: 1425-1450 SLP Time Calculation (min) (ACUTE ONLY): 25 min  Assessment / Plan / Recommendation Clinical Impression  Pt was participating in tele-visit with her mother upon arrival.  She had animated facial expressions, but spontaneous verbal output was limited.  She stated her brothers' names Jorja Loa, Caryn Bee) and counted from one to three.  Discrimination among items in open field in room was 80% accurate (e.g, common items like tv, window, stuffed animal). During writing task, pt was able to right her first and last name with first-letter cue; she wrote "Tim" and "Caryn Bee" to dictation but without visual cues. Written output was rapid/impulsive with occasional extraneous markings after completion of words, but accuracy was relatively good. Phoned her mother and discussed session and ways to help communication. Recommend continued SLP to address aphasia, swallowing.     HPI HPI: Pt is a 24 yo female presenting with sudden onset R sided weakness and aphasia. 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 She was intubated for thrombectomy 8/21. 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 MRI showed early subacute infarct of the left basal ganglia involving the posterior putamen and caudate body. Pt self-extubated 8/22. Pt tested (+) for COVID-19 but was asymptomatic upon arrival. PMH: panic attack, anxiety      SLP Plan  Continue with current plan of care                          Oral Care Recommendations: Oral care BID Follow up Recommendations: Inpatient Rehab SLP Visit Diagnosis: Aphasia (R47.01) Plan: Continue with current plan of care       GO               Siniya Lichty L. Samson Frederic, MA CCC/SLP Acute Rehabilitation Services Office number [phone] Pager [phone]  Blenda Mounts Laurice 05/24/2020,  2:55 PM

## 2020-05-24 NOTE — Plan of Care (Signed)
  Problem: Intracerebral Hemorrhage Tissue Perfusion: Goal: Complications of Intracerebral Hemorrhage will be minimized Outcome: Progressing   Problem: Ischemic Stroke/TIA Tissue Perfusion: Goal: Complications of ischemic stroke/TIA will be minimized 05/24/2020 1821 by Maurie Boettcher, RN Outcome: Progressing 05/24/2020 1006 by Maurie Boettcher, RN Outcome: Progressing   Problem: Spontaneous Subarachnoid Hemorrhage Tissue Perfusion: Goal: Complications of Spontaneous Subarachnoid Hemorrhage will be minimized Outcome: Progressing   Problem: Clinical Measurements: Goal: Will remain free from infection Outcome: Progressing   Problem: Nutrition: Goal: Adequate nutrition will be maintained Outcome: Progressing   Problem: Safety: Goal: Ability to remain free from injury will improve Outcome: Progressing   Problem: Skin Integrity: Goal: Risk for impaired skin integrity will decrease Outcome: Progressing

## 2020-05-24 NOTE — Progress Notes (Signed)
Patient's mother shared a concern for the patient that she might not feel comfortable with a female nurse bathing her. This RN passed that along to day shift and charge nurse.

## 2020-05-25 ENCOUNTER — Inpatient Hospital Stay (HOSPITAL_COMMUNITY): Payer: BC Managed Care – PPO

## 2020-05-25 DIAGNOSIS — I63411 Cerebral infarction due to embolism of right middle cerebral artery: Secondary | ICD-10-CM

## 2020-05-25 DIAGNOSIS — Q211 Atrial septal defect: Secondary | ICD-10-CM

## 2020-05-25 DIAGNOSIS — I639 Cerebral infarction, unspecified: Secondary | ICD-10-CM

## 2020-05-25 LAB — BASIC METABOLIC PANEL
Anion gap: 10 (ref 5–15)
BUN: 8 mg/dL (ref 6–20)
CO2: 19 mmol/L — ABNORMAL LOW (ref 22–32)
Calcium: 8.1 mg/dL — ABNORMAL LOW (ref 8.9–10.3)
Chloride: 109 mmol/L (ref 98–111)
Creatinine, Ser: 0.65 mg/dL (ref 0.44–1.00)
GFR calc Af Amer: >60 mL/min (ref >60)
GFR calc non Af Amer: >60 mL/min (ref >60)
Glucose, Bld: 120 mg/dL — ABNORMAL HIGH (ref 70–99)
Potassium: 3.5 mmol/L (ref 3.5–5.1)
Sodium: 138 mmol/L (ref 135–145)

## 2020-05-25 LAB — CBC
HCT: 33.8 % — ABNORMAL LOW (ref 36.0–46.0)
Hemoglobin: 10.6 g/dL — ABNORMAL LOW (ref 12.0–15.0)
MCH: 28.3 pg (ref 26.0–34.0)
MCHC: 31.4 g/dL (ref 30.0–36.0)
MCV: 90.1 fL (ref 80.0–100.0)
Platelets: 137 10*3/uL — ABNORMAL LOW (ref 150–400)
RBC: 3.75 MIL/uL — ABNORMAL LOW (ref 3.87–5.11)
RDW: 12.7 % (ref 11.5–15.5)
WBC: 14 10*3/uL — ABNORMAL HIGH (ref 4.0–10.5)
nRBC: 0 % (ref 0.0–0.2)

## 2020-05-25 MED ORDER — SODIUM CHLORIDE 0.9 % IV BOLUS
250.0000 mL | Freq: Once | INTRAVENOUS | Status: AC
  Administered 2020-05-26: 250 mL via INTRAVENOUS

## 2020-05-25 MED ORDER — POTASSIUM CHLORIDE 20 MEQ PO PACK
40.0000 meq | PACK | Freq: Once | ORAL | Status: AC
  Administered 2020-05-25: 40 meq via ORAL
  Filled 2020-05-25: qty 2

## 2020-05-25 MED ORDER — POLYETHYLENE GLYCOL 3350 17 G PO PACK: 17.0000 g | PACK | Freq: Every day | ORAL | Status: DC | PRN

## 2020-05-25 MED ORDER — DOCUSATE SODIUM 50 MG/5ML PO LIQD: 100.0000 mg | Freq: Every day | ORAL | Status: DC | PRN

## 2020-05-25 NOTE — Progress Notes (Signed)
TCD bubble study completed. Refer to "CV Proc" under chart review to view preliminary results.  05/25/2020 1:14 PM Eula Fried., MHA, RVT, RDCS, RDMS

## 2020-05-25 NOTE — Plan of Care (Signed)
°  Problem: Intracerebral Hemorrhage Tissue Perfusion: Goal: Complications of Intracerebral Hemorrhage will be minimized Outcome: Progressing   Problem: Ischemic Stroke/TIA Tissue Perfusion: Goal: Complications of ischemic stroke/TIA will be minimized Outcome: Progressing   Problem: Spontaneous Subarachnoid Hemorrhage Tissue Perfusion: Goal: Complications of Spontaneous Subarachnoid Hemorrhage will be minimized Outcome: Progressing   Problem: Clinical Measurements: Goal: Ability to maintain clinical measurements within normal limits will improve Outcome: Progressing Goal: Will remain free from infection Outcome: Progressing Goal: Respiratory complications will improve Outcome: Progressing   Problem: Activity: Goal: Risk for activity intolerance will decrease Outcome: Progressing   Problem: Nutrition: Goal: Adequate nutrition will be maintained Outcome: Progressing   Problem: Safety: Goal: Ability to remain free from injury will improve Outcome: Progressing   Problem: Skin Integrity: Goal: Risk for impaired skin integrity will decrease Outcome: Progressing

## 2020-05-25 NOTE — Progress Notes (Signed)
STROKE TEAM PROGRESS NOTE   INTERVAL HISTORY Pt RN at bedside. Pt still has spiking fever over night, still has tachycardia. On exam, she is still lethargic, but eyes open, did not speak or repeat with me, but able to follow commands on both hands. Still has right leg plegic. On Abx.   OBJECTIVE Vitals:   05/25/20 0800 05/25/20 0900 05/25/20 1000 05/25/20 1100  BP: (!) 124/97 (!) 109/95 (!) 79/57 127/77  Pulse:   (!) 128 (!) 127  Resp: (!) 24 (!) 24 (!) 21 (!) 21  Temp: 99.3 F (37.4 C)     TempSrc:      SpO2: 100% 100% 100% 100%  Weight:      Height:       CBC:  Recent Labs  Lab 05/21/20 1220 05/21/20 1220 05/22/20 0356 05/23/20 0900 05/24/20 0524 05/25/20 0506  WBC 10.9*   < > 12.2*   < > 11.0* 14.0*  NEUTROABS 8.3*  --  8.9*  --   --   --   HGB 11.2*   < > 10.4*   < > 11.4* 10.6*  HCT 35.4*   < > 33.5*   < > 35.6* 33.8*  MCV 91.2   < > 91.8   < > 90.6 90.1  PLT 243   < > 209   < > 165 137*   < > = values in this interval not displayed.   Basic Metabolic Panel:  Recent Labs  Lab 05/21/20 0057 05/21/20 1220 05/22/20 0356 05/23/20 0900 05/24/20 0524 05/25/20 0506  NA 148*   < > 150*   < > 139 138  K 4.4  --  3.2*   < > 3.1* 3.5  CL 119*  --  116*   < > 107 109  CO2 19*  --  22   < > 21* 19*  GLUCOSE 115*  --  127*   < > 133* 120*  BUN 8  --  6   < > 9 8  CREATININE 0.57  --  0.54   < > 0.62 0.65  CALCIUM 8.6*  --  8.8*   < > 8.4* 8.1*  MG 2.0  --  1.8  --   --   --   PHOS 3.2  --  3.2  --   --   --    < > = values in this interval not displayed.   Lipid Panel:     Component Value Date/Time   CHOL 156 05/20/2020 0958   TRIG 105 05/20/2020 0958   TRIG 104 05/20/2020 0958   HDL 57 05/20/2020 0958   CHOLHDL 2.7 05/20/2020 0958   VLDL 21 05/20/2020 0958   LDLCALC 78 05/20/2020 0958   HgbA1c:  Lab Results  Component Value Date   HGBA1C 5.6 05/20/2020   Urine Drug Screen:     Component Value Date/Time   LABOPIA NONE DETECTED 05/20/2020 1736    COCAINSCRNUR NONE DETECTED 05/20/2020 1736   LABBENZ NONE DETECTED 05/20/2020 1736   AMPHETMU NONE DETECTED 05/20/2020 1736   THCU POSITIVE (A) 05/20/2020 1736   LABBARB NONE DETECTED 05/20/2020 1736    Alcohol Level No results found for: [facility]  IMAGING CT HEAD CODE STROKE WO CONTRAST 05/19/2020 Dense left ICA terminus and MCA. No acute hemorrhage. ASPECTS is 10.   CT Code Stroke CTA Head W/WO contrast CT Code Stroke CTA Neck W/WO contrast 05/19/2020 1. 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  2. Mildly indistinct appearance of the proximal left ICA is likely from motion artifact, consider cervical run. No atheromatous changes or vasculopathy seen in the neck.   Neuro Interventional Radiology - Cerebral Angiogram with Intervention - Dr Loreta AveWagner 05/19/20 2:27 PM Left MCA: Baseline TICI 0 First Pass Device:                 Local aspiration and solitaire 4 x 40              Result                         TICI 0 Second Pass Device:           Local aspiration and solitaire 4 x 40              Result                         TICI 3 Left ACA/pericallosal  Baseline TICI 0,    Final TICI 0 pericallosal artery Findings:  Patent right CFA Left M1 occlusion, final is TICI 3  Left ACA A2 occlusion migrated to pericallosal artery during the case.  Could not complete a first pass safely.  Significant irregularity of the cervical ICA, concerning for dissection, that resolved after observation, and once DAPT was initiated.  The final appearance may represent spasm vs FMD/vasculitis  CT HEAD WO CONTRAST 05/19/2020 New large volume of subarachnoid and intraventricular hemorrhage.  CT HEAD WO CONTRAST 05/20/2020 1. Subarachnoid hemorrhage along the anterior interhemispheric fissure with slightly increased extension into both lateral ventricles.  2. Slight increase in size of the temporal horn of the left lateral ventricle consistent with early communicating  hydrocephalus.   MR BRAIN WO CONTRAST 05/20/2020 1. Early subacute infarct of the left basal ganglia involving the posterior putamen and caudate body.  2. Subarachnoid hemorrhage along the interhemispheric fissure extending into the lateral ventricles.  3. 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   CT HEAD WO CONTRAST 05/21/2020 1. Stable appearance of left basal ganglia nonhemorrhagic infarction. 2. Stable medial left frontal extra-axial hemorrhage, extending into the ventricles bilaterally. 3. Subarachnoid hemorrhage posteriorly on the left and along the inter cerebral hemisphere likely represents redistribution without significant new hemorrhage. 4. Hemorrhage is present within the aqueduct of Sylvius. 5. Progressive dilation of the ventricles compatible with developing hydrocephalus. 6. No new areas of hemorrhage.   CT HEAD WO CONTRAST 05/22/2020 1. Regressed intraventricular hemorrhage since yesterday with small volume residual. Stable mild ventriculomegaly, and possible mild transependymal edema. 2. Stable left inferior frontal gyrus parasagittal hematoma (approximately 12 mL) and regional edema. No significant midline shift, basilar cisterns are patent. 3. Stable left basal ganglia infarcts. 4. No new intracranial abnormality.   Transthoracic Echocardiogram  1. Left ventricular ejection fraction, by estimation, is 60 to 65%. The left ventricle has normal function. The left ventricle has no regional wall motion abnormalities. Left ventricular diastolic parameters were normal.  2. Right ventricular systolic function is normal. The right ventricular size is normal.  3. The mitral valve is normal in structure. Trivial mitral valve regurgitation. No evidence of mitral stenosis.  4. The aortic valve is normal in structure. Aortic valve regurgitation is not visualized. No aortic stenosis is present.  5. The inferior vena cava is normal in  size with greater than 50% respiratory variability, suggesting right atrial pressure of 3 mmHg.  ECHOCARDIOGRAM COMPLETE 05/20/2020 1. Left ventricular ejection fraction, by estimation, is 60 to 65%. The left ventricle has normal function. The left ventricle has no regional wall motion abnormalities. Left ventricular diastolic parameters were normal.   2. Right ventricular systolic function is normal. The right ventricular size is normal.   3. The mitral valve is normal in structure. Trivial mitral valve regurgitation. No evidence of mitral stenosis.   4. The aortic valve is normal in structure. Aortic valve regurgitation is not visualized. No aortic stenosis is present.   5. The inferior vena cava is normal in size with greater than 50% respiratory variability, suggesting right atrial pressure of 3 mmHg.   Bilateral Lower Extremity Venous Dopplers  05/21/2020 RIGHT: - There is no evidence of deep vein thrombosis in the lower extremity. However, portions of this examination were limited- see technologist comments above.  - No cystic structure found in the popliteal fossa.   LEFT: - There is no evidence of deep vein thrombosis in the lower extremity. However, portions of this examination were limited- see technologist comments above.  - No cystic structure found in the popliteal fossa.    Transcranial Doppler w/ Bubble 05/25/2020 Not cooperative for Valsalva.  Bubble study showed Spencer degree 3 at rest.  Recommend TEE for further confirmation.   PHYSICAL EXAM   Temp:  [99.3 F (37.4 C)-102.3 F (39.1 C)] 99.3 F (37.4 C) (08/27 0800) Pulse Rate:  [96-147] 127 (08/27 1100) Resp:  [16-26] 21 (08/27 1100) BP: (73-139)/(57-97) 127/77 (08/27 1100) SpO2:  [100 %] 100 % (08/27 1100)  General - Well nourished, well developed, lethargic and flat effect  Ophthalmologic - fundi not visualized due to noncooperation.   Cardiovascular - regular rhythm but tachycardia  Neuro - lethargic with  flat affect, eyes open, nonverbal, did not repeat sentences with me today.  Able to follow simple commands on both hands and left foot. Able to have bilateral gaze with voice on both sides, however right gaze incomplete, still has left gaze preference. PERRL. Blinking to visual threat bilaterally. Right facial droop. Tongue protrusion not cooperative. LUE spontaneous movement and LLE strong withdraw to pain. RUE proximal 4/5, distal finger grip 4/5. RLE slight withdraw to pain. Sensation, coordination not cooperative and gait not tested.   ASSESSMENT/PLAN Ms. Anaid Haney is a 24 y.o. female with history of tobacco use, anxiety (panic attacks) and hormonal birth control presented with right-sided weakness and aphasia that started abruptly at 9:30 AM.  COVID - positive. IV t-PA - Saturday 05/19/20 at 1145. Thrombectomy - Dr Loreta Ave - Left M1 occlusion, final is TICI 3.   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 Paradoxical emboli in the setting of PFO - DVT neg 3. COVID hypercoagulability in the setting of OCP use and intermittent smoking - however, COVID Ab high titer  CT Head - Dense left ICA terminus and MCA. No acute hemorrhage. ASPECTS is 10.      CTA H&N - Emergent large vessel occlusion at the left ICA terminus continuing into the MCA. Mildly indistinct appearance of the proximal left ICA is likely from motion artifact, consider cervical run. No atheromatous changes or vasculopathy seen in the neck.  IR - left MCA and A2 occlusion s/p TICI3 of MCA and TICI0 of ACA. Initially  left ICA stenosis post IR but resolved on the final run  CT head 8/21 - New large volume of subarachnoid and intraventricular hemorrhage.   CT head 8/22 [facility] along the anterior interhemispheric fissure with slightly increased extension  into both lateral ventricles. Slight increase in size of the temporal horn of the left lateral ventricle consistent with early communicating hydrocephalus.   MRI head 8/22 -  Early subacute infarct of the left basal ganglia involving the posterior putamen and caudate body.  CT head 8/23 - stable L basal ganglia infarct, ICH/IVH/SAH. Progressive dilation of ventricles. CT head 8/24 - regressed IVH. Stable infarct and frontal ICH. Stable ventriculomegaly CT head 8/26 - stable infarcts & hemorrhage w/ mild ventriculomegaly  LE Venous Dopplers - no DVT  2D Echo EF 60-65%  TCD bubble study Spencer degree 3 at rest.  Not cooperative for Valsalva.  Sars Corona Virus 2 - positive  LDL - 78  HgbA1c 5.6  Hypercoagulable and autoimmune labs ANCA proteinase 3 ab 6.0 (h) ANA positive - titer pending  UDS - THC positive  VTE prophylaxis - SCDs  No antithrombotic prior to admission, now on No antithrombotic due to [facility] and IVH  Ongoing aggressive stroke risk factor management  Therapy recommendations:  CIR   Disposition:  Pending   Hydrocephalus, mild  CT head showed mildly increased size of left temporal horn  CT repeat stable hemorrhage but mild progression of hydrocephalus  CT head 8/24 - regressed IVH. Stable infarct and frontal ICH. Stable mild ventriculomegaly  NSG on board  3% hypertonic saline - 50 cc/hr ->25cc->10cc -> off  Na - 139->149->154->148->152->150->140->139->138   PICC placed 8/23   Repeat CT on 8/26 stable  PFO  TCD bubble study showed Spencer degree 3 at rest.  Not cooperative with Valsalva  Consider TEE after Covid isolation and before CIR discharge  If PFO confirmed, consider to refer to Dr. Excell Seltzer cardiology for PFO closure.  BP management  Stable  On Nimodipine for [facility] - discussed with Dr. Conchita Paris, will keep nimodipine while inpt - d/c on discharge if neuro stable  BP goal < 160 given SAH and IVH . Long-term BP goal  normotensive  Hyperlipidemia  Home Lipid lowering medication: none   LDL 78, goal < 70  No statin at this time due to [facility] and IVH  Consider low dose statin at discharge  ? Hypercoagulable state   COVID positive - asymptomatic  D-dimer > 20->16.13->17.44-> >20-> >20   OCP user with smoking and THC use  Hypercoagulable work up so far + ANCA proteinase 3 Ab and ANA - titer pending  COVID - 19 infection  COVID + test 8/21  Asymptomatic  Ferritin 24->30->37->51->93->92  Fibrinogen 289  CRP 0.8->2.4->3.3->3.9->8.7->9.1   D-dimer > 20->16.13->17.44-> >20-> >20 -> >20   COVID Ab titer > 2500  Per ID, ok to be off isolation in 10 days following + test as asymptomatic (05/29/2020)  Leukocytosis, Fever  Etiology unknown  TMax 101.2->103.1->100.1->102.3  WBC 10.9->12.2->12.5->11.0->14.0  UA small LE, rare bact, 0-5 WBC - neg  CXR neg  Urine culture neg  Blood Cx NGTD  On vanco and zosyn empiric treatment 8/25>>  CCM on board  Tachycardia  HR 100-120  Likely due to fever  Continue monitoring  Euthyroid sick syndrome  Low TSH 0.054   normal free T4 and T3  Likely related to critical illness  Tobacco abuse  Intermittent smoking per mom  Smoking cessation counseling will be provided  Other Stroke  Risk Factors  ETOH use, advised to drink no more than 1 alcoholic beverage per day  Obesity, Body mass index is 31.86 kg/m., recommend weight loss, diet and exercise as appropriate   Hormonal birth control - both progesterone and estrogen - Estarylla  Other Active Problems  Code status - Full code  Mild anemia 11.2->10.4->11.6->11.4->10.6  Hypokalemia 3.3->4.4->3.2 ->3.4 ->3.1->3.5 - supplement   Low normal B12 level 300 - supplement  Hospital day # 6  This patient is critically ill due to left ICA stroke, left ICA occlusion status post thrombectomy and TPA, SAH, IVH, hydrocephalus, fever, leukocytosis, tachycardia, PFO and at significant  risk of neurological worsening, death form recurrent stroke, hemorrhagic conversion, obstructive hydrocephalus, sepsis, seizure, heart failure. This patient's care requires constant monitoring of vital signs, hemodynamics, respiratory and cardiac monitoring, review of multiple databases, neurological assessment, discussion with family, other specialists and medical decision making of high complexity. I spent 40 minutes of neurocritical care time in the care of this patient. I also discussed with CCM NP Brett Canales. I had long discussion with mom over the phone, updated pt current condition, treatment plan and potential prognosis, and answered all the questions.  She expressed understanding and appreciation.     Marvel Plan, MD PhD Stroke Neurology 05/25/2020 12:22 PM   To contact Stroke Continuity provider, please refer to [email or web address]. After hours, contact General Neurology

## 2020-05-25 NOTE — Progress Notes (Addendum)
eLink Physician-Brief Progress Note Patient Name: Gail Montgomery DOB: 1996/09/23 MRN: [id number]   Date of Service  05/25/2020  HPI/Events of Note  Sinus tachycardia with rate 115, patient has a history of anxiety and panic attacks.  eICU Interventions  Normal saline 250 ml iv fluid bolus x 1 to exclude hypovolemia as etiology of sinus tachycardia vs anxiety.        Thomasene Lot [name] 05/25/2020, 11:42 PM

## 2020-05-25 NOTE — H&P (Signed)
NAME:  Gail Montgomery, MRN:  [id number], DOB:  09/27/1996, LOS: 6 ADMISSION DATE:  05/19/2020, CONSULTATION DATE:  05/19/2020 REFERRING MD:  Jobe Marker, CHIEF COMPLAINT: Fever management  Brief History   24 year old female without significant PMH on BCP, some recreational mariajuana use. She was normal upon waking the morning of  8/21.  At 09:30 she had sudden onset of right hemiparesis, and aphasia. She presented 8/21/2021to the [facility] ED as a Code Stroke  with acute left MCA syndrome. She received tPA at 11:29 am she was deemed a  candidate for thrombectomy, and was taken to IR. PCCM  have been asked to manage vent and assist with care.  History of present illness   24 year old female normal upon waking up this morning 8/21 2021. At 09:30 she had sudden onset of right hemiparesis, and aphasia. She presents 8/21/2021to the [facility] ED as a Code Stroke  with acute left MCA syndrome.She was screened as Covid +, but was asymptomatic.  She received tPA at 11:29 am , she was deemed a  candidate for thrombectomy, and was taken to IR..In IR she had successful  opening of her LMCA with reperfusion, but LACA has a residual small distal anterior cerebral clot.  During procedure there was  ICA vasospasm vs dissection. She was loaded with ASA and Plavix. There was concern that she would need carotid  stenting . However upon re-imaging the vessel was open, and she did not require stent. There is concern for a large  subarachnoid bleed. She is currently going for CT scan of the head to further evaluate for subarachnoid hemorrhage and potential need for reversal of tPA.  PCCM have been asked to manage Vent and assist with care.   Past Medical History   Past Medical History:  Diagnosis Date  . Anxiety   . Panic attack     Significant Hospital Events   05/19/2020 Admission to [facility] with acute L MCA syndrome  Consults:  8/21 PCCM  Procedures:  8/21: Mechanical Thrombectomy   Significant Diagnostic Tests:    05/19/2020 CTA Brain Cut off at the left ICA terminus with visible luminal clot. There is continuation of non opacification throughout the left M1 segment which is clot by CT. Non opacification at the left A1 origin. No contralateral branch occlusion is seen. Intravascular enhancement seen in the left operculum branches on the delayed phase.  Micro Data:  05/19/2020  SARS Coronavirus 2 NEGATIVE POSITIVEAbnormal   05/19/2020 >>> Blood Cultures x 2   Antimicrobials:  None   Interim history/subjective:  Asked to reassess regarding persistent fever with mild leukocytosis.  Patient is improving neurologically.  She is eating well with no signs of aspiration.  She has had no complaints to suggest a source of infection.  Objective   Blood pressure 139/80, pulse (!) 139, temperature 99.1 F (37.3 C), resp. rate (!) 23, height 5\' 4"  (1.626 m), weight 84.2 kg, SpO2 100 %.        Intake/Output Summary (Last 24 hours) at 05/25/2020 1615 Last data filed at 05/25/2020 1400 Gross per 24 hour  Intake 2430.37 ml  Output 1155 ml  Net 1275.37 ml   Filed Weights   05/19/20 1100  Weight: 84.2 kg    Examination: General: Somnolent but calm on room air HENT: Sclera intact oral mucosa intact. Lungs: Bilateral chest excursion, Clear throughout Cardiovascular: S1, S2, RRR No RMG Abdomen: Soft, NT, ND, BS +Body mass index is 31.86 kg/m. Extremities: No obvious deformities, brisk refill  to nailbeds Neuro: sedated and intubated, paralyzed post proecdure , pupils 3 mm and reactive GU: Foley cath with clear amber urine  Resolved Hospital Problem list     Assessment & Plan:   LICA to MCA embolic stroke.  Subarachnoid Hemorrhage Covid 19 Positive Incidental finding on admission BMI 31.86 ( 62 inches, 84.2 KG) Persistent fevers with mild leukocytosis, cultures negative.  MRSA swab negative no signs of infection.  Plan:  The patient has no signs or symptoms of infection apart from  COVID-19.  While she is not having severe respiratory symptoms she may still be having fevers at day 7 post positive test.  Furthermore she may be resorbing her intracerebral clot as a cause of fever. At this time I would recommend stopping all antibiotics and observing her.    Best practice:  Diet: Full diet Pain/Anxiety/Delirium protocol (if indicated): No no sedation  VAP protocol (if indicated): Not intubated DVT prophylaxis: None for now, received TPA plavix and ASA GI prophylaxis: Protonix Glucose control: CBG and SSI Mobility: BR Code Status: Full Family Communication: Updated by Dr Roda Shutters Disposition: ICU  Labs   CBC: Recent Labs  Lab 05/19/20 1120 05/19/20 1122 05/19/20 2232 05/19/20 2232 05/20/20 0958 05/20/20 0958 05/21/20 1220 05/22/20 0356 05/23/20 0900 05/24/20 0524 05/25/20 0506  WBC 4.8   < > 7.4   < > 7.6   < > 10.9* 12.2* 12.5* 11.0* 14.0*  NEUTROABS 1.8  --  6.5  --  5.7  --  8.3* 8.9*  --   --   --   HGB 12.2   < > 12.2   < > 11.6*   < > 11.2* 10.4* 11.6* 11.4* 10.6*  HCT 38.5   < > 37.9   < > 36.3   < > 35.4* 33.5* 36.2 35.6* 33.8*  MCV 90.6   < > 88.3   < > 89.9   < > 91.2 91.8 91.9 90.6 90.1  PLT 285   < > 288   < > 273   < > 243 209 200 165 137*   < > = values in this interval not displayed.    Basic Metabolic Panel: Recent Labs  Lab 05/20/20 0958 05/20/20 1923 05/21/20 0057 05/21/20 1220 05/21/20 2334 05/22/20 0356 05/23/20 0900 05/24/20 0524 05/25/20 0506  NA 149*  149*   < > 148*   < > 152* 150* 140 139 138  K 3.3*  --  4.4  --   --  3.2* 3.4* 3.1* 3.5  CL 117*  --  119*  --   --  116* 107 107 109  CO2 22  --  19*  --   --  22 22 21* 19*  GLUCOSE 125*  --  115*  --   --  127* 118* 133* 120*  BUN 6  --  8  --   --  6 9 9 8   CREATININE 0.74  --  0.57  --   --  0.54 0.57 0.62 0.65  CALCIUM 8.4*  --  8.6*  --   --  8.8* 9.0 8.4* 8.1*  MG 1.9  --  2.0  --   --  1.8  --   --   --   PHOS 3.6  --  3.2  --   --  3.2  --   --   --    < > =  values in this interval not displayed.   GFR: Estimated Creatinine Clearance: 115.8 mL/min (by  C-G formula based on SCr of 0.65 mg/dL). Recent Labs  Lab 05/19/20 1807 05/19/20 2232 05/22/20 0356 05/23/20 0900 05/24/20 0524 05/25/20 0506  PROCALCITON <0.10  --   --   --   --   --   WBC  --    < > 12.2* 12.5* 11.0* 14.0*  LATICACIDVEN 1.7  --   --   --   --   --    < > = values in this interval not displayed.    Liver Function Tests: Recent Labs  Lab 05/19/20 1120 05/19/20 1807 05/20/20 0958 05/21/20 0057 05/22/20 0356  AST 17 28 25 23 17   ALT 13 15 14 11 13   ALKPHOS 59 55 54 50 48  BILITOT 0.4 0.3 0.3 0.6 0.3  PROT 7.5 6.8 7.0 6.6 7.3  ALBUMIN 3.1* 2.7* 2.8* 2.7* 2.7*   No results for input(s): LIPASE, AMYLASE in the last 168 hours. No results for input(s): AMMONIA in the last 168 hours.  ABG    Component Value Date/Time   PHART 7.401 05/19/2020 1732   PCO2ART 33.8 05/19/2020 1732   PO2ART 394 (H) 05/19/2020 1732   HCO3 21.3 05/19/2020 1732   TCO2 22 05/19/2020 1732   ACIDBASEDEF 3.0 (H) 05/19/2020 1732   O2SAT 100.0 05/19/2020 1732     Coagulation Profile: Recent Labs  Lab 05/19/20 1120  INR 0.9    Cardiac Enzymes: No results for input(s): CKTOTAL, CKMB, CKMBINDEX, TROPONINI in the last 168 hours.  HbA1C: Hgb A1c MFr Bld  Date/Time Value Ref Range Status  05/20/2020 09:58 AM 5.6 4.8 - 5.6 % Final    Comment:    (NOTE) Pre diabetes:          5.7%-6.4%  Diabetes:              >6.4%  Glycemic control for   <7.0% adults with diabetes     CBG: Recent Labs  Lab 05/19/20 1116  GLUCAP 107*    Review of Systems:   Unable as patient   Past Medical History  She,  has a past medical history of Anxiety and Panic attack.   Surgical History    Past Surgical History:  Procedure Laterality Date  . IR CT HEAD LTD  05/19/2020  . IR CT HEAD LTD  05/19/2020  . IR PERCUTANEOUS ART THROMBECTOMY/INFUSION INTRACRANIAL INC DIAG ANGIO  05/19/2020  . IR  05/21/2020 GUIDE VASC ACCESS RIGHT  05/19/2020  . RADIOLOGY WITH ANESTHESIA N/A 05/19/2020   Procedure: IR WITH ANESTHESIA;  Surgeon: Radiologist, Medication, MD;  Location: [facility] OR;  Service: Radiology;  Laterality: N/A;     Social History   reports that she has been smoking cigarettes. She has never used smokeless tobacco. She reports current alcohol use. She reports that she does not use drugs.   Family History   Her family history is not on file.   Allergies Allergies  Allergen Reactions  . 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     Home Medications  Prior to Admission medications   Not on File     05/21/2020, MD [facility] ICU Physician [facility] [place] [facility]  Pager: [phone] Mobile: [phone] After hours: [phone].  05/25/2020, 4:19 PM

## 2020-05-25 NOTE — Progress Notes (Signed)
Physical Therapy Treatment Patient Details Name: Gail Montgomery MRN: [id number] DOB: Apr 23, 1996 Today's Date: 05/25/2020    History of Present Illness 24 yo who had acute R sided weakness and aphasia 05/19/20 due to LVO at [facility]. Screened Covid + but asymptomatic; tPA and underwent thrombectomy; during procedure had ICA vasospasm vs dissection.  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 Pt self extubated 05/20/20.     PT Comments    Pt tolerates treatment well, sitting at edge of bed for >10 minutes and tolerating multiple trials of standing. Pt demonstrates some motor planning deficits, with difficulty initiating movement of RLE during bed mobility and with difficulty utilizing comb and toothbrush during session. Pt continues to demonstrate improved mobility with use of tactile cueing and with initiation of therapists, however she does demonstrate some improvement in initiation during transfers. Pt's family observing and participating in session through video call. PT continues to recommend CIR placement for this patient once medically ready and off of COVID precautions.   Follow Up Recommendations  CIR     Equipment Recommendations  Wheelchair (measurements PT);Wheelchair cushion (measurements PT);Hospital bed;3in1 (PT)    Recommendations for Other Services       Precautions / Restrictions Precautions Precautions: Fall Precaution Comments: right sided weakness (R leg most significantly) Restrictions Weight Bearing Restrictions: No    Mobility  Bed Mobility Overal bed mobility: Needs Assistance Bed Mobility: Sit to Supine;Supine to Sit     Supine to sit: Max assist;+2 for physical assistance Sit to supine: Total assist;+2 for physical assistance      Transfers Overall transfer level: Needs assistance Equipment used: 2 person hand held  assist Transfers: Sit to/from Stand Sit to Stand: Max assist;Mod assist         General transfer comment: maxA x2 initially with bilateral hand hold, progresses to modA for 2nd stand. Pt performs some weight shifting in standing with modA x2, no R knee buckling noted  Ambulation/Gait                 Stairs             Wheelchair Mobility    Modified Rankin (Stroke Patients Only) Modified Rankin (Stroke Patients Only) Pre-Morbid Rankin Score: No symptoms Modified Rankin: Severe disability     Balance Overall balance assessment: Needs assistance Sitting-balance support: Single extremity supported;Bilateral upper extremity supported;Feet supported Sitting balance-Leahy Scale: Poor Sitting balance - Comments: maxA initially, progresses to modA or minA x2 with hand hold Postural control: Posterior lean Standing balance support: Bilateral upper extremity supported Standing balance-Leahy Scale: Poor Standing balance comment: modA x2                            Cognition Arousal/Alertness: Awake/alert Behavior During Therapy: Flat affect Overall Cognitive Status: Impaired/Different from baseline Area of Impairment: Attention;Following commands;Problem solving                   Current Attention Level: Focused   Following Commands: Follows one step commands with increased time     Problem Solving: Slow processing;Requires tactile cues General Comments: delayed command following, better with tactile cueing or initiation, likely apraxic      Exercises      General Comments General comments (skin integrity, edema, etc.): pt tachy up to 161 during standing exercise. Slow and gradual increase in HR with more activity during  session. HR recovers with sitting or return to supine      Pertinent Vitals/Pain Pain Assessment: Faces Faces Pain Scale: Hurts even more Pain Location: generalized grimace, noted most with RLE movement Pain Descriptors  / Indicators: Grimacing Pain Intervention(s): Monitored during session    Home Living                      Prior Function            PT Goals (current goals can now be found in the care plan section) Acute Rehab PT Goals Patient Stated Goal: mother would like her to return home with mom at d/c Progress towards PT goals: Progressing toward goals    Frequency    Min 4X/week      PT Plan Current plan remains appropriate    Co-evaluation PT/OT/SLP Co-Evaluation/Treatment: Yes Reason for Co-Treatment: Complexity of the patient's impairments (multi-system involvement);Necessary to address cognition/behavior during functional activity;For patient/therapist safety;To address functional/ADL transfers PT goals addressed during session: Mobility/safety with mobility;Balance;Strengthening/ROM        AM-PAC PT "6 Clicks" Mobility   Outcome Measure  Help needed turning from your back to your side while in a flat bed without using bedrails?: Total Help needed moving from lying on your back to sitting on the side of a flat bed without using bedrails?: Total Help needed moving to and from a bed to a chair (including a wheelchair)?: Total Help needed standing up from a chair using your arms (e.g., wheelchair or bedside chair)?: A Lot Help needed to walk in hospital room?: Total Help needed climbing 3-5 steps with a railing? : Total 6 Click Score: 7    End of Session   Activity Tolerance: Patient tolerated treatment well Patient left: in bed;with call bell/phone within reach;with nursing/sitter in room Nurse Communication: Mobility status PT Visit Diagnosis: Muscle weakness (generalized) (M62.81);Difficulty in walking, not elsewhere classified (R26.2);Hemiplegia and hemiparesis Hemiplegia - Right/Left: Right Hemiplegia - dominant/non-dominant: Dominant Hemiplegia - caused by: Cerebral infarction;Nontraumatic intracerebral hemorrhage     Time: 1318-1406 PT Time  Calculation (min) (ACUTE ONLY): 48 min  Charges:  $Therapeutic Activity: 23-37 mins                     Arlyss Gandy, PT, DPT Acute Rehabilitation Pager: [phone]    Arlyss Gandy 05/25/2020, 2:23 PM

## 2020-05-26 LAB — CBC
HCT: 31.7 % — ABNORMAL LOW (ref 36.0–46.0)
Hemoglobin: 10.1 g/dL — ABNORMAL LOW (ref 12.0–15.0)
MCH: 29 pg (ref 26.0–34.0)
MCHC: 31.9 g/dL (ref 30.0–36.0)
MCV: 91.1 fL (ref 80.0–100.0)
Platelets: 129 10*3/uL — ABNORMAL LOW (ref 150–400)
RBC: 3.48 MIL/uL — ABNORMAL LOW (ref 3.87–5.11)
RDW: 12.6 % (ref 11.5–15.5)
WBC: 13.9 10*3/uL — ABNORMAL HIGH (ref 4.0–10.5)
nRBC: 0 % (ref 0.0–0.2)

## 2020-05-26 LAB — BASIC METABOLIC PANEL
Anion gap: 9 (ref 5–15)
BUN: 10 mg/dL (ref 6–20)
CO2: 19 mmol/L — ABNORMAL LOW (ref 22–32)
Calcium: 7.9 mg/dL — ABNORMAL LOW (ref 8.9–10.3)
Chloride: 110 mmol/L (ref 98–111)
Creatinine, Ser: 0.66 mg/dL (ref 0.44–1.00)
GFR calc Af Amer: >60 mL/min (ref >60)
GFR calc non Af Amer: >60 mL/min (ref >60)
Glucose, Bld: 87 mg/dL (ref 70–99)
Potassium: 3.8 mmol/L (ref 3.5–5.1)
Sodium: 138 mmol/L (ref 135–145)

## 2020-05-26 LAB — PROCALCITONIN: Procalcitonin: <0.1 ng/mL

## 2020-05-26 NOTE — Progress Notes (Signed)
Occupational Therapy Progress Note (late entry)  Pt seen in conjunction with PT.  Family video'd in mid treatment session, and nursing present mid session.  Pt much more alert today and interactive. She continues with flat affect, but did attempt a smile on occasion.  She was able to follow ~65% of simple one step motor commands, but this decreases as she fatigues.  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 She demonstrate isolated movement of Rt UE with shoulder weakness.  She was able to brush teeth with max A, and stood x 2 with max A +2 progressing to mod A +2.  HR to 161 with activity, but recovers quickly with seated rest break.  Pt continues to be an excellent candidate for CIR once she has been removed from COVID precautions.      05/25/20 1600  OT Visit Information  Last OT Received On 05/26/20  Assistance Needed +2  PT/OT/SLP Co-Evaluation/Treatment Yes  Reason for Co-Treatment Complexity of the patient's impairments (multi-system involvement);Necessary to address cognition/behavior during functional activity;For patient/therapist safety;To address functional/ADL transfers  OT goals addressed during session ADL's and self-care  History of Present Illness 24 yo who had acute R sided weakness and aphasia 05/19/20 due to LVO at [facility]. Screened Covid + but asymptomatic; tPA and underwent thrombectomy; during procedure had ICA vasospasm vs dissection.  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 Pt self extubated 05/20/20.   Precautions  Precautions Fall  Precaution Comments right sided weakness (R leg most significantly)  Pain Assessment  Pain Assessment Faces  Faces Pain Scale 6  Pain Location generalized grimace, noted most with RLE movement  Pain Descriptors / Indicators Grimacing  Pain  Intervention(s) Monitored during session;Repositioned  Cognition  Arousal/Alertness Awake/alert  Behavior During Therapy Flat affect  Overall Cognitive Status Impaired/Different from baseline  Area of Impairment Attention;Following commands;Problem solving  Current Attention Level Focused;Sustained  Following Commands Follows one step commands with increased time;Follows one step commands inconsistently  Problem Solving Slow processing;Requires tactile cues  General Comments Pt followed ~65% of simple one step motor commands.   She demonstrates flat affect, but does occasionally smile briefly.  She demonstrates a delay in processing of 5-20 seconds  Upper Extremity Assessment  Upper Extremity Assessment Generalized weakness  RUE Deficits / Details Pt demonstrates shoulder flexion to ~70*, full elbow AROM grossly, and full isolated finger movement.  she is able to opose fifth digit  RUE Coordination decreased fine motor;decreased gross motor  Lower Extremity Assessment  Lower Extremity Assessment Defer to PT evaluation  ADL  Overall ADL's  Needs assistance/impaired  Eating/Feeding Total assistance  Grooming Wash/dry face;Oral care;Maximal assistance  Grooming Details (indicate cue type and reason) once task initiated, she was able to partially brush teeth  Functional mobility during ADLs Maximal assistance;+2 for physical assistance;+2 for safety/equipment  General ADL Comments Pt provided with a comb and asked how to use it, but she just turned it around in her hand.  Despite max cues, she was unable to demonstrate use.  When provided with toothbrush, she started to move it to her mouth, but unable to complete movement.  When toothbrush moved to her mouth, she was able to partially brush her teeth with mod hand over hand assist   Bed Mobility  Overal bed mobility Needs Assistance  Bed Mobility Sit to Supine;Supine to Sit  Supine to sit  Max assist;+2 for physical assistance  Sit to supine  Total assist;+2 for physical assistance  General bed mobility comments pt demonstrated difficulty moving Rt LE off the bed - appears to be apraxic.  And required assist to lift trunk.  Pt required assist with all aspects when returning to supine   Balance  Overall balance assessment Needs assistance  Sitting-balance support Single extremity supported;Bilateral upper extremity supported;Feet supported  Sitting balance-Leahy Scale Poor  Sitting balance - Comments maxA initially, progresses to modA or minA x2 with hand hold  Postural control Posterior lean  Standing balance support Bilateral upper extremity supported  Standing balance-Leahy Scale Poor  Standing balance comment modA x2  Vision- Assessment  Additional Comments pt tracks therapist, makes eye contact.  Looks to video screen to interact/watch family on video call   Transfers  Overall transfer level Needs assistance  Equipment used 2 person hand held assist  Transfers Sit to/from Stand  Sit to Stand Max assist;Mod assist  General transfer comment maxA x2 initially with bilateral hand hold, progresses to modA for 2nd stand. Pt performs some weight shifting in standing with modA x2, no R knee buckling noted  General Comments  General comments (skin integrity, edema, etc.) HR to 161 with activity at times sustaining in the 150s, but returned to 110-120 with rest break  OT - End of Session  Activity Tolerance Patient tolerated treatment well  Patient left in bed;with call bell/phone within reach;with nursing/sitter in room;with family/visitor present (family present via video call )  Nurse Communication Mobility status  OT Assessment/Plan  OT Plan Discharge plan remains appropriate  OT Visit Diagnosis Unsteadiness on feet (R26.81);Other abnormalities of gait and mobility (R26.89);Muscle weakness (generalized) (M62.81);Apraxia (R48.2);Other symptoms and signs involving cognitive function;Cognitive communication deficit (R41.841);Other  symptoms and signs involving the nervous system (R29.898);Hemiplegia and hemiparesis  Symptoms and signs involving cognitive functions Cerebral infarction;Nontraumatic SAH  Hemiplegia - Right/Left Right  Hemiplegia - dominant/non-dominant Dominant  Hemiplegia - caused by Cerebral infarction;Nontraumatic SAH  OT Frequency (ACUTE ONLY) Min 2X/week  Follow Up Recommendations CIR;Supervision/Assistance - 24 hour  OT Equipment 3 in 1 bedside commode;Other (comment)  AM-PAC OT "6 Clicks" Daily Activity Outcome Measure (Version 2)  Help from another person eating meals? 2  Help from another person taking care of personal grooming? 2  Help from another person toileting, which includes using toliet, bedpan, or urinal? 1  Help from another person bathing (including washing, rinsing, drying)? 1  Help from another person to put on and taking off regular upper body clothing? 1  Help from another person to put on and taking off regular lower body clothing? 1  6 Click Score 8  OT Goal Progression  Progress towards OT goals Progressing toward goals  OT Time Calculation  OT Start Time (ACUTE ONLY) 1317  OT Stop Time (ACUTE ONLY) 1411  OT Time Calculation (min) 54 min  OT General Charges  $OT Visit 1 Visit  OT Treatments  $Self Care/Home Management  8-22 mins  $Neuromuscular Re-education 8-22 mins  Eber Jones., OTR/L Acute Rehabilitation Services Pager [phone] Office [phone]

## 2020-05-26 NOTE — Progress Notes (Signed)
STROKE TEAM PROGRESS NOTE   INTERVAL HISTORY No changes overnight, patient is alert and awake and responds to questions "are u ok", "anything new bothering" appropriately.  OBJECTIVE Vitals:   05/26/20 0300 05/26/20 0400 05/26/20 0500 05/26/20 0600  BP: 131/78 132/82 133/88 133/75  Pulse: (!) 136 (!) 128 (!) 116 (!) 121  Resp: (!) 27 20  (!) 23  Temp:      TempSrc:  Oral    SpO2: 100% 100% 100% 100%  Weight:      Height:       CBC:  Recent Labs  Lab 05/21/20 1220 05/21/20 1220 05/22/20 0356 05/23/20 0900 05/25/20 0506 05/26/20 0539  WBC 10.9*   < > 12.2*   < > 14.0* 13.9*  NEUTROABS 8.3*  --  8.9*  --   --   --   HGB 11.2*   < > 10.4*   < > 10.6* 10.1*  HCT 35.4*   < > 33.5*   < > 33.8* 31.7*  MCV 91.2   < > 91.8   < > 90.1 91.1  PLT 243   < > 209   < > 137* 129*   < > = values in this interval not displayed.   Basic Metabolic Panel:  Recent Labs  Lab 05/21/20 0057 05/21/20 1220 05/22/20 0356 05/23/20 0900 05/25/20 0506 05/26/20 0539  NA 148*   < > 150*   < > 138 138  K 4.4  --  3.2*   < > 3.5 3.8  CL 119*  --  116*   < > 109 110  CO2 19*  --  22   < > 19* 19*  GLUCOSE 115*  --  127*   < > 120* 87  BUN 8  --  6   < > 8 10  CREATININE 0.57  --  0.54   < > 0.65 0.66  CALCIUM 8.6*  --  8.8*   < > 8.1* 7.9*  MG 2.0  --  1.8  --   --   --   PHOS 3.2  --  3.2  --   --   --    < > = values in this interval not displayed.   Lipid Panel:     Component Value Date/Time   CHOL 156 05/20/2020 0958   TRIG 105 05/20/2020 0958   TRIG 104 05/20/2020 0958   HDL 57 05/20/2020 0958   CHOLHDL 2.7 05/20/2020 0958   VLDL 21 05/20/2020 0958   LDLCALC 78 05/20/2020 0958   HgbA1c:  Lab Results  Component Value Date   HGBA1C 5.6 05/20/2020   Urine Drug Screen:     Component Value Date/Time   LABOPIA NONE DETECTED 05/20/2020 1736   COCAINSCRNUR NONE DETECTED 05/20/2020 1736   LABBENZ NONE DETECTED 05/20/2020 1736   AMPHETMU NONE DETECTED 05/20/2020 1736   THCU  POSITIVE (A) 05/20/2020 1736   LABBARB NONE DETECTED 05/20/2020 1736    Alcohol Level No results found for: [facility]  IMAGING  CT HEAD CODE STROKE WO CONTRAST 05/19/2020 Dense left ICA terminus and MCA. No acute hemorrhage. ASPECTS is 10.   CT Code Stroke CTA Head W/WO contrast CT Code Stroke CTA Neck W/WO contrast 05/19/2020 1. 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  2. Mildly indistinct appearance of the proximal left ICA is likely from motion artifact, consider cervical run. No atheromatous changes or vasculopathy seen in the neck.   Neuro Interventional Radiology -  Cerebral Angiogram with Intervention - Dr Loreta AveWagner 05/19/20 2:27 PM Left MCA: Baseline TICI 0 First Pass Device:                 Local aspiration and solitaire 4 x 40              Result                         TICI 0 Second Pass Device:           Local aspiration and solitaire 4 x 40              Result                         TICI 3 Left ACA/pericallosal  Baseline TICI 0,    Final TICI 0 pericallosal artery Findings:  Patent right CFA Left M1 occlusion, final is TICI 3  Left ACA A2 occlusion migrated to pericallosal artery during the case.  Could not complete a first pass safely.  Significant irregularity of the cervical ICA, concerning for dissection, that resolved after observation, and once DAPT was initiated.  The final appearance may represent spasm vs FMD/vasculitis  CT HEAD WO CONTRAST 05/19/2020 New large volume of subarachnoid and intraventricular hemorrhage.  CT HEAD WO CONTRAST 05/20/2020 1. Subarachnoid hemorrhage along the anterior interhemispheric fissure with slightly increased extension into both lateral ventricles.  2. Slight increase in size of the temporal horn of the left lateral ventricle consistent with early communicating hydrocephalus.   MR BRAIN WO CONTRAST 05/20/2020 1. Early subacute infarct of the left basal ganglia involving the posterior putamen and  caudate body.  2. Subarachnoid hemorrhage along the interhemispheric fissure extending into the lateral ventricles.  3. 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   CT HEAD WO CONTRAST 05/21/2020 1. Stable appearance of left basal ganglia nonhemorrhagic infarction. 2. Stable medial left frontal extra-axial hemorrhage, extending into the ventricles bilaterally. 3. Subarachnoid hemorrhage posteriorly on the left and along the inter cerebral hemisphere likely represents redistribution without significant new hemorrhage. 4. Hemorrhage is present within the aqueduct of Sylvius. 5. Progressive dilation of the ventricles compatible with developing hydrocephalus. 6. No new areas of hemorrhage.   CT HEAD WO CONTRAST 05/22/2020 1. Regressed intraventricular hemorrhage since yesterday with small volume residual. Stable mild ventriculomegaly, and possible mild transependymal edema. 2. Stable left inferior frontal gyrus parasagittal hematoma (approximately 12 mL) and regional edema. No significant midline shift, basilar cisterns are patent. 3. Stable left basal ganglia infarcts. 4. No new intracranial abnormality.   ECHOCARDIOGRAM COMPLETE 05/20/2020 1. Left ventricular ejection fraction, by estimation, is 60 to 65%. The left ventricle has normal function. The left ventricle has no regional wall motion abnormalities. Left ventricular diastolic parameters were normal.   2. Right ventricular systolic function is normal. The right ventricular size is normal.   3. The mitral valve is normal in structure. Trivial mitral valve regurgitation. No evidence of mitral stenosis.   4. The aortic valve is normal in structure. Aortic valve regurgitation is not visualized. No aortic stenosis is present.   5. The inferior vena cava is normal in size with greater than 50% respiratory variability, suggesting right atrial pressure of 3 mmHg.   Bilateral Lower Extremity Venous  Dopplers  05/21/2020 RIGHT: - There is no evidence of deep vein thrombosis in the lower extremity. However, portions of this examination were  limited- see technologist comments above.  - No cystic structure found in the popliteal fossa.   LEFT: - There is no evidence of deep vein thrombosis in the lower extremity. However, portions of this examination were limited- see technologist comments above.  - No cystic structure found in the popliteal fossa.    Transcranial Doppler w/ Bubble 05/25/2020 Not cooperative for Valsalva.  Bubble study showed Spencer degree 3 at rest.  Recommend TEE for further confirmation.  Consider TEE after Covid isolation and before CIR discharge   PHYSICAL EXAM  Temp:  [99.1 F (37.3 C)-103 F (39.4 C)] 102 F (38.9 C) (08/28 0000) Pulse Rate:  [111-150] 121 (08/28 0600) Resp:  [18-27] 23 (08/28 0600) BP: (79-141)/(57-118) 133/75 (08/28 0600) SpO2:  [100 %] 100 % (08/28 0600)  General - Well nourished, well developed, lethargic and flat effect  Ophthalmologic - fundi not visualized due to noncooperation.   Cardiovascular - regular rhythm but tachycardia  Neuro (stable)- lethargic with flat affect, eyes open, nonverbal, did not repeat sentences with me today but can use expressions to answer some questions.  Able to follow simple commands on both hands and left foot. Able to have bilateral gaze with voice on both sides, however right gaze incomplete, still has left gaze preference. PERRL. Blinking to visual threat bilaterally. Right facial droop. Tongue protrusion not cooperative. LUE spontaneous movement and LLE strong withdraw to pain. RUE proximal 4/5, distal finger grip 4/5. RLE slight withdraw to pain. Sensation, coordination not cooperative and gait not tested.   ASSESSMENT/PLAN Ms. Gail Montgomery is a 24 y.o. female with history of tobacco use, anxiety (panic attacks) and hormonal birth control presented with right-sided weakness and aphasia that started  abruptly at 9:30 AM.  COVID - positive. IV t-PA - Saturday 05/19/20 at 1145. Thrombectomy - Dr Loreta Ave - Left M1 occlusion, final is TICI 3.   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 Paradoxical emboli in the setting of PFO - DVT neg 3. COVID hypercoagulability in the setting of OCP use and intermittent smoking - however, COVID Ab high titer  CT Head - Dense left ICA terminus and MCA. No acute hemorrhage. ASPECTS is 10.      CTA H&N - Emergent large vessel occlusion at the left ICA terminus continuing into the MCA. Mildly indistinct appearance of the proximal left ICA is likely from motion artifact, consider cervical run. No atheromatous changes or vasculopathy seen in the neck.  IR - left MCA and A2 occlusion s/p TICI3 of MCA and TICI0 of ACA. Initially left ICA stenosis post IR but resolved on the final run  CT head 8/21 - New large volume of subarachnoid and intraventricular hemorrhage.   CT head 8/22 [facility] along the anterior interhemispheric fissure with slightly increased extension into both lateral ventricles. Slight increase in size of the temporal horn of the left lateral ventricle consistent with early communicating hydrocephalus.   MRI head 8/22 -  Early subacute infarct of the left basal ganglia involving the posterior putamen and caudate body.  CT head 8/23 - stable L basal ganglia infarct, ICH/IVH/SAH. Progressive dilation of ventricles. CT head 8/24 - regressed IVH. Stable infarct and frontal ICH. Stable ventriculomegaly CT head 8/26 - stable infarcts & hemorrhage w/ mild ventriculomegaly  LE Venous  Dopplers - no DVT  2D Echo EF 60-65%  TCD bubble study Spencer degree 3 at rest.  Not cooperative for Valsalva.  Sars Corona Virus 2 - positive  LDL - 78  HgbA1c 5.6  Hypercoagulable and  autoimmune labs ANCA proteinase 3 ab 6.0 (h) ANA positive - C - ANCA titers < 1:20 (neg)  ;  P - ANCA titers < 1:20 (neg)  UDS - THC positive  VTE prophylaxis - SCDs  No antithrombotic prior to admission, now on No antithrombotic due to [facility] and IVH  Ongoing aggressive stroke risk factor management  Therapy recommendations:  CIR   Disposition:  Pending   Hydrocephalus, mild  CT head showed mildly increased size of left temporal horn  CT repeat stable hemorrhage but mild progression of hydrocephalus  CT head 8/24 - regressed IVH. Stable infarct and frontal ICH. Stable mild ventriculomegaly  NSG on board  3% hypertonic saline - 50 cc/hr ->25cc->10cc -> off  Na - 139->149->154->148->152->150->140->139->138->138  PICC placed 8/23   Repeat CT on 8/26 stable  PFO  TCD bubble study showed Spencer degree 3 at rest.  Not cooperative with Valsalva  Consider TEE after Covid isolation and before CIR discharge  If PFO confirmed, consider to refer to Dr. Excell Seltzer cardiology for PFO closure.  BP management  Stable  On Nimodipine for [facility] - discussed with Dr. Conchita Paris, will keep nimodipine while inpt - d/c on discharge if neuro stable  BP goal < 160 given SAH and IVH (SBP 130's this AM) . Long-term BP goal normotensive  Hyperlipidemia  Home Lipid lowering medication: none   LDL 78, goal < 70  No statin at this time due to [facility] and IVH  Consider low dose statin at discharge  ? Hypercoagulable state   COVID positive - asymptomatic  D-dimer > 20->16.13->17.44-> >20-> >20   OCP user with smoking and THC use  Hypercoagulable work up (so far) - ANCS proteinase 3 - 6.0 (H) (0.0 - 3.5)   - C - ANCA titers < 1:20 (neg)  ;  P - ANCA titers < 1:20 (neg)  Myeloperoxidase Abs - < 9.0 (WNL)  COVID - 19 infection  COVID + test 8/21  Asymptomatic  Ferritin 24->30->37->51->93->92  Fibrinogen 289  CRP 0.8->2.4->3.3->3.9->8.7->9.1   D-dimer > 20->16.13->17.44-> >20-> >20  -> >20   COVID Ab titer > 2500  Per ID, ok to be off isolation in 10 days following + test as asymptomatic (05/29/2020)  Leukocytosis, Fever  Etiology unknown  TMax 101.2->103.1->100.1->102.3->99.1->103->102  WBC 10.9->12.2->12.5->11.0->14.0->13.9  UA small LE, rare bact, 0-5 WBC - neg  CXR neg  Urine culture - 8/25 - neg  Blood Cx - 8/25 - NG x 2 days - final pending  On vanco and zosyn empiric treatment 8/25>>  CCM on board  Tachycardia  HR 100-120  Likely due to fever  Continue monitoring  Euthyroid sick syndrome  Low TSH 0.054   normal free T4 and T3  Likely related to critical illness  Tobacco abuse  Intermittent smoking per mom  Smoking cessation counseling will be provided  Other Stroke Risk Factors  ETOH use, advised to drink no more than 1 alcoholic beverage per day  Obesity, Body mass index is 31.86 kg/m., recommend weight loss, diet and exercise as appropriate   Hormonal birth control - both progesterone and estrogen - Estarylla  Other Active Problems  Code status - Full code  Mild anemia 11.2->10.4->11.6->11.4->10.6->10.1  Hypokalemia 3.3->4.4->3.2 ->3.4 ->3.1->3.5 - supplement - 3.8  Low  normal B12 level 300 - supplement  Thrombocytopenia - 209->137->129  Hospital day # 7   Personally examined patient and images, and have participated in and made any corrections needed to history, physical, neuro exam,assessment and plan as stated above.  I have personally obtained the history, evaluated lab date, reviewed imaging studies and agree with radiology interpretations.    Naomie Dean, MD Stroke Neurology  I spent 25 minutes of face-to-face and non-face-to-face time with patient. This included prechart review, lab review, study review, order entry, electronic health record documentation, patient education on the different diagnostic and therapeutic options, counseling and coordination of care, risks and benefits of management,  compliance, or risk factor reduction    To contact Stroke Continuity provider, please refer to [email or web address]. After hours, contact General Neurology

## 2020-05-27 LAB — BASIC METABOLIC PANEL
Anion gap: 12 (ref 5–15)
BUN: 10 mg/dL (ref 6–20)
CO2: 18 mmol/L — ABNORMAL LOW (ref 22–32)
Calcium: 8.3 mg/dL — ABNORMAL LOW (ref 8.9–10.3)
Chloride: 103 mmol/L (ref 98–111)
Creatinine, Ser: 0.64 mg/dL (ref 0.44–1.00)
GFR calc Af Amer: >60 mL/min (ref >60)
GFR calc non Af Amer: >60 mL/min (ref >60)
Glucose, Bld: 123 mg/dL — ABNORMAL HIGH (ref 70–99)
Potassium: 3.8 mmol/L (ref 3.5–5.1)
Sodium: 133 mmol/L — ABNORMAL LOW (ref 135–145)

## 2020-05-27 LAB — CBC
HCT: 31.7 % — ABNORMAL LOW (ref 36.0–46.0)
Hemoglobin: 10.3 g/dL — ABNORMAL LOW (ref 12.0–15.0)
MCH: 29.2 pg (ref 26.0–34.0)
MCHC: 32.5 g/dL (ref 30.0–36.0)
MCV: 89.8 fL (ref 80.0–100.0)
Platelets: 148 10*3/uL — ABNORMAL LOW (ref 150–400)
RBC: 3.53 MIL/uL — ABNORMAL LOW (ref 3.87–5.11)
RDW: 12.5 % (ref 11.5–15.5)
WBC: 17 10*3/uL — ABNORMAL HIGH (ref 4.0–10.5)
nRBC: 0 % (ref 0.0–0.2)

## 2020-05-27 NOTE — Progress Notes (Signed)
STROKE TEAM PROGRESS NOTE   INTERVAL HISTORY Patient is still nonverbal but interacting with facial expressions, no events overnight, the only IV she is on is fluids, at this time we will going to transfer her to [facility].  OBJECTIVE Vitals:   05/27/20 0700 05/27/20 0800 05/27/20 0900 05/27/20 1000  BP: 125/67 122/80 99/85 102/89  Pulse: (!) 142  (!) 122 (!) 131  Resp: (!) 22 (!) 23 (!) 21 (!) 24  Temp:  (!) 101.4 F (38.6 C)    TempSrc:  Oral    SpO2: 94%  97% 96%  Weight:      Height:       CBC:  Recent Labs  Lab 05/21/20 1220 05/21/20 1220 05/22/20 0356 05/23/20 0900 05/26/20 0539 05/27/20 0629  WBC 10.9*   < > 12.2*   < > 13.9* 17.0*  NEUTROABS 8.3*  --  8.9*  --   --   --   HGB 11.2*   < > 10.4*   < > 10.1* 10.3*  HCT 35.4*   < > 33.5*   < > 31.7* 31.7*  MCV 91.2   < > 91.8   < > 91.1 89.8  PLT 243   < > 209   < > 129* 148*   < > = values in this interval not displayed.   Basic Metabolic Panel:  Recent Labs  Lab 05/21/20 0057 05/21/20 1220 05/22/20 0356 05/23/20 0900 05/26/20 0539 05/27/20 0629  NA 148*   < > 150*   < > 138 133*  K 4.4  --  3.2*   < > 3.8 3.8  CL 119*  --  116*   < > 110 103  CO2 19*  --  22   < > 19* 18*  GLUCOSE 115*  --  127*   < > 87 123*  BUN 8  --  6   < > 10 10  CREATININE 0.57  --  0.54   < > 0.66 0.64  CALCIUM 8.6*  --  8.8*   < > 7.9* 8.3*  MG 2.0  --  1.8  --   --   --   PHOS 3.2  --  3.2  --   --   --    < > = values in this interval not displayed.   Lipid Panel:     Component Value Date/Time   CHOL 156 05/20/2020 0958   TRIG 105 05/20/2020 0958   TRIG 104 05/20/2020 0958   HDL 57 05/20/2020 0958   CHOLHDL 2.7 05/20/2020 0958   VLDL 21 05/20/2020 0958   LDLCALC 78 05/20/2020 0958   HgbA1c:  Lab Results  Component Value Date   HGBA1C 5.6 05/20/2020   Urine Drug Screen:     Component Value Date/Time   LABOPIA NONE DETECTED 05/20/2020 1736   COCAINSCRNUR NONE DETECTED 05/20/2020 1736   LABBENZ NONE DETECTED  05/20/2020 1736   AMPHETMU NONE DETECTED 05/20/2020 1736   THCU POSITIVE (A) 05/20/2020 1736   LABBARB NONE DETECTED 05/20/2020 1736    Alcohol Level No results found for: [facility]  IMAGING  CT HEAD CODE STROKE WO CONTRAST 05/19/2020 Dense left ICA terminus and MCA. No acute hemorrhage. ASPECTS is 10.   CT Code Stroke CTA Head W/WO contrast CT Code Stroke CTA Neck W/WO contrast 05/19/2020 1. 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  2. Mildly indistinct appearance of the proximal left ICA is likely from motion artifact, consider  cervical run. No atheromatous changes or vasculopathy seen in the neck.   Neuro Interventional Radiology - Cerebral Angiogram with Intervention - Dr Loreta Ave 05/19/20 2:27 PM Left MCA: Baseline TICI 0 First Pass Device:                 Local aspiration and solitaire 4 x 40              Result                         TICI 0 Second Pass Device:           Local aspiration and solitaire 4 x 40              Result                         TICI 3 Left ACA/pericallosal  Baseline TICI 0,    Final TICI 0 pericallosal artery Findings:  Patent right CFA Left M1 occlusion, final is TICI 3  Left ACA A2 occlusion migrated to pericallosal artery during the case.  Could not complete a first pass safely.  Significant irregularity of the cervical ICA, concerning for dissection, that resolved after observation, and once DAPT was initiated.  The final appearance may represent spasm vs FMD/vasculitis  CT HEAD WO CONTRAST 05/19/2020 New large volume of subarachnoid and intraventricular hemorrhage.  CT HEAD WO CONTRAST 05/20/2020 1. Subarachnoid hemorrhage along the anterior interhemispheric fissure with slightly increased extension into both lateral ventricles.  2. Slight increase in size of the temporal horn of the left lateral ventricle consistent with early communicating hydrocephalus.   MR BRAIN WO CONTRAST 05/20/2020 1. Early subacute  infarct of the left basal ganglia involving the posterior putamen and caudate body.  2. Subarachnoid hemorrhage along the interhemispheric fissure extending into the lateral ventricles.  3. 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   CT HEAD WO CONTRAST 05/21/2020 1. Stable appearance of left basal ganglia nonhemorrhagic infarction. 2. Stable medial left frontal extra-axial hemorrhage, extending into the ventricles bilaterally. 3. Subarachnoid hemorrhage posteriorly on the left and along the inter cerebral hemisphere likely represents redistribution without significant new hemorrhage. 4. Hemorrhage is present within the aqueduct of Sylvius. 5. Progressive dilation of the ventricles compatible with developing hydrocephalus. 6. No new areas of hemorrhage.   CT HEAD WO CONTRAST 05/22/2020 1. Regressed intraventricular hemorrhage since yesterday with small volume residual. Stable mild ventriculomegaly, and possible mild transependymal edema. 2. Stable left inferior frontal gyrus parasagittal hematoma (approximately 12 mL) and regional edema. No significant midline shift, basilar cisterns are patent. 3. Stable left basal ganglia infarcts. 4. No new intracranial abnormality.   CT HEAD WO CONTRAST 05/24/20 IMPRESSION: 1. 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 2. Unchanged intracranial hemorrhage with mild lateral ventriculomegaly.  ECHOCARDIOGRAM COMPLETE 05/20/2020 1. Left ventricular ejection fraction, by estimation, is 60 to 65%. The left ventricle has normal function. The left ventricle has no regional wall motion abnormalities. Left ventricular diastolic parameters were normal.   2. Right ventricular systolic function is normal. The right ventricular size is normal.   3. The mitral valve is normal in structure. Trivial mitral valve regurgitation. No evidence of mitral stenosis.   4. The aortic valve is normal in structure. Aortic valve  regurgitation is not visualized. No aortic stenosis is present.   5. The inferior vena cava is normal in size with  greater than 50% respiratory variability, suggesting right atrial pressure of 3 mmHg.   Bilateral Lower Extremity Venous Dopplers  05/21/2020 RIGHT: - There is no evidence of deep vein thrombosis in the lower extremity. However, portions of this examination were limited- see technologist comments above.  - No cystic structure found in the popliteal fossa.   LEFT: - There is no evidence of deep vein thrombosis in the lower extremity. However, portions of this examination were limited- see technologist comments above.  - No cystic structure found in the popliteal fossa.    Transcranial Doppler w/ Bubble 05/25/2020 Not cooperative for Valsalva.  Bubble study showed Spencer degree 3 at rest.  Recommend TEE for further confirmation.  Consider TEE after Covid isolation and before CIR discharge   PHYSICAL EXAM  Temp:  [101.4 F (38.6 C)-103 F (39.4 C)] 101.4 F (38.6 C) (08/29 0800) Pulse Rate:  [121-153] 131 (08/29 1000) Resp:  [18-30] 24 (08/29 1000) BP: (79-149)/(54-108) 102/89 (08/29 1000) SpO2:  [94 %-99 %] 96 % (08/29 1000)  General - Well nourished, well developed, lethargic and flat effect  Ophthalmologic - fundi not visualized due to noncooperation.   Cardiovascular - regular rhythm and rate  Neuro (stable)- lethargic with flat affect, eyes open, nonverbal, did not repeat sentences with me today or answer any questions or say her name but can use expressions to answer some questions, she will raise eyebrows and shrug.  Able to follow simple commands on both hands and left foot. Able to have bilateral gaze with voice on both sides, however right gaze incomplete, still has left gaze preference. PERRL. Blinking to visual threat bilaterally. Right facial droop. Tongue protrusion not cooperative. LUE spontaneous movement and LLE strong withdraw to pain. RUE proximal 4/5,  distal finger grip 4/5. RLE slight withdraw to pain. Sensation, coordination not cooperative and gait not tested.   ASSESSMENT/PLAN Gail Montgomery is a 24 y.o. female with history of tobacco use, anxiety (panic attacks) and hormonal birth control presented with right-sided weakness and aphasia that started abruptly at 9:30 AM.  COVID - positive. IV t-PA - Saturday 05/19/20 at 1145. Thrombectomy - Dr Loreta Ave - Left M1 occlusion, final is TICI 3.   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 Paradoxical emboli in the setting of PFO - DVT neg 3. COVID hypercoagulability in the setting of OCP use and intermittent smoking - however, COVID Ab high titer  CT Head - Dense left ICA terminus and MCA. No acute hemorrhage. ASPECTS is 10.      CTA H&N - Emergent large vessel occlusion at the left ICA terminus continuing into the MCA. Mildly indistinct appearance of the proximal left ICA is likely from motion artifact, consider cervical run. No atheromatous changes or vasculopathy seen in the neck.  IR - left MCA and A2 occlusion s/p TICI3 of MCA and TICI0 of ACA. Initially left ICA stenosis post IR but resolved on the final run  CT head 8/21 - New large volume of subarachnoid and intraventricular hemorrhage.   CT head 8/22 [facility] along the anterior interhemispheric fissure with slightly increased extension into both lateral ventricles. Slight increase in size of the temporal horn of the left lateral ventricle consistent with early communicating hydrocephalus.   MRI head 8/22 -  Early  subacute infarct of the left basal ganglia involving the posterior putamen and caudate body.  CT head 8/23 - stable L basal ganglia infarct, ICH/IVH/SAH. Progressive dilation of ventricles. CT head 8/24 - regressed IVH. Stable infarct and  frontal ICH. Stable ventriculomegaly CT head 8/26 - stable infarcts & hemorrhage w/ mild ventriculomegaly  LE Venous Dopplers - no DVT  2D Echo EF 60-65%  TCD bubble study Spencer degree 3 at rest.  Not cooperative for Valsalva.  Sars Corona Virus 2 - positive  LDL - 78  HgbA1c 5.6  Hypercoagulable and autoimmune labs ANCA proteinase 3 ab 6.0 (h) ANA positive - C - ANCA titers < 1:20 (neg)  ;  P - ANCA titers < 1:20 (neg)  UDS - THC positive  VTE prophylaxis - SCDs  No antithrombotic prior to admission, now on No antithrombotic due to [facility] and IVH  Ongoing aggressive stroke risk factor management  Therapy recommendations:  CIR   Disposition:  Pending   Hydrocephalus, mild  CT head showed mildly increased size of left temporal horn   CT repeat stable hemorrhage but mild progression of hydrocephalus  CT head 8/24 - regressed IVH. Stable infarct and frontal ICH. Stable mild ventriculomegaly  NSG on board  3% hypertonic saline - 50 cc/hr ->25cc->10cc -> off  Na - 139->149->154->148->152->150->140->139->138->138->133  PICC placed 8/23   Repeat CT on 8/26 stable  PFO  TCD bubble study showed Spencer degree 3 at rest.  Not cooperative with Valsalva  Consider TEE after Covid isolation and before CIR discharge  If PFO confirmed, consider to refer to Dr. Excell Seltzer cardiology for PFO closure.  BP management  Stable  On Nimodipine for [facility] - discussed with Dr. Conchita Paris, will keep nimodipine while inpt - d/c on discharge if neuro stable  SBP goal < 160 given SAH and IVH (SBP 99 - 125 this AM) . Long-term BP goal normotensive  Hyperlipidemia  Home Lipid lowering medication: none   LDL 78, goal < 70  No statin at this time due to [facility] and IVH  Consider low dose statin at discharge  ? Hypercoagulable state   COVID positive - asymptomatic  D-dimer > 20->16.13->17.44-> >20-> >20   OCP user with smoking and THC use  Hypercoagulable work up (so far) - ANCS  proteinase 3 - 6.0 (H) (0.0 - 3.5)   - C - ANCA titers < 1:20 (neg)  ;  P - ANCA titers < 1:20 (neg)  Myeloperoxidase Abs - < 9.0 (WNL)  COVID - 19 infection  COVID + test 8/21  Asymptomatic  Ferritin 24->30->37->51->93->92  Fibrinogen 289  CRP 0.8->2.4->3.3->3.9->8.7->9.1   D-dimer > 20->16.13->17.44-> >20-> >20 -> >20   COVID Ab titer > 2500  Per ID, ok to be off isolation in 10 days following + test as asymptomatic (05/29/2020)  Leukocytosis, Fever  Etiology unknown  TMax 101.2->103.1->100.1->102.3->99.1->103->102->101.4  WBC 10.9->12.2->12.5->11.0->14.0->13.9->17.0  UA small LE, rare bact, 0-5 WBC - neg  CXR neg  Urine culture - 8/25 - no growth  Blood Cx - 8/25 - NG x 3 days - final pending  On vanco and zosyn empiric treatment 8/25>>discontinued 8/27 - now off all abxs  CCM on board  Tachycardia  HR 121-153  Likely due to fever  Continue monitoring  Euthyroid sick syndrome  Low TSH 0.054   normal free T4 and T3  Likely related to critical illness  Tobacco abuse  Intermittent smoking per mom  Smoking cessation counseling will be provided  Other Stroke Risk Factors  ETOH use, advised to drink no more than 1 alcoholic beverage per day  Obesity, Body mass index is 31.86 kg/m., recommend weight loss, diet and exercise as appropriate   Hormonal birth control - both progesterone and estrogen - Estarylla  Other Active Problems  Code status - Full code  Mild anemia 11.2->10.4->11.6->11.4->10.6->10.1->10.3  Hypokalemia 3.3->4.4->3.2 ->3.4 ->3.1->3.5 - supplement - 3.8->3.8  Low normal B12 level 300 - supplement  Thrombocytopenia - 209->137->129->148  PICC line placed 8/23  Hospital day # 8    consider TEE after Covid quarantine and prior to CIR.   Personally examined patient and images, and have participated in and made any corrections needed to history, physical, neuro exam,assessment and plan as stated above.  I have personally  obtained the history, evaluated lab date, reviewed imaging studies and agree with radiology interpretations.    Naomie DeanAntonia Devaunte Gasparini, MD Stroke Neurology  I spent 25 minutes of face-to-face and non-face-to-face time with patient. This included prechart review, lab review, study review, order entry, electronic health record documentation, patient education on the different diagnostic and therapeutic options, counseling and coordination of care, risks and benefits of management, compliance, or risk factor reduction    To contact Stroke Continuity provider, please refer to [email or web address]. After hours, contact General Neurology

## 2020-05-27 NOTE — Progress Notes (Signed)
Dialed mom inadvertently. Emotional support provided. Provided update via chart and encouraged to call unit after 9pm for bedside update.

## 2020-05-27 NOTE — Care Plan (Signed)
I was paged by [facility] they have taken over care of this patient, patient has been tachycardic with fevers off and on and mild leukocytosis for many days, she has had an infectious work-up, she has been treated with antibiotics, she is in no acute distress and clinically she is improving at this time; this may be due to Covid or neurogenic due to her stroke however we do not feel as though any further infectious work-up at this time is needed because she is clinically improving, we will remove the PICC line which may be a source of infection and fevers, I discussed this with the charge nurse Marylene Land, this will be signed out to neuro hospitalist, I also spoke with CCM who has been caring for this patient who agrees.  Mother has called the desk on [facility] 9 times, however prior to leaving the ICU CCM attending discussed this multiple times with mother, patient is stable and clinically she is improving so at this time we will just monitor.

## 2020-05-27 NOTE — Progress Notes (Signed)
Contacted mother to inform her that we will be transferring the patient to [facility]. She has requested to speak with Dr. Denese Killings regarding her concerns prior to transfer. Informed Dr. Denese Killings of request.

## 2020-05-27 NOTE — Progress Notes (Signed)
On-call cross coverage note  Called by patient RN, patient's heart rate in 150s. All of today has been tachycardic, with fevers off and on and mild leukocytosis for many days. She has had a complete infectious work-up. PICC line also has been removed. PCCM was consulting and have recommended observing off antibiotics due to this being secondary to her COVID-19 infection, from which she is not very symptomatic but could still be causing fevers versus central fever from her hemorrhage.  We will continue to monitor her clinically.  Neurologically, remains unchanged per the RN.  No new recommendations at this time.  We will consider consulting internal medicine, now that she is on the floor, if necessary.   -- Milon Dikes, MD [facility] Pager: [phone] If 7pm to 7am, please call on call as listed on AMION.

## 2020-05-28 ENCOUNTER — Inpatient Hospital Stay (HOSPITAL_COMMUNITY): Payer: BC Managed Care – PPO

## 2020-05-28 DIAGNOSIS — U071 COVID-19: Secondary | ICD-10-CM

## 2020-05-28 DIAGNOSIS — R509 Fever, unspecified: Secondary | ICD-10-CM

## 2020-05-28 DIAGNOSIS — R000 Tachycardia, unspecified: Secondary | ICD-10-CM

## 2020-05-28 DIAGNOSIS — I63411 Cerebral infarction due to embolism of right middle cerebral artery: Secondary | ICD-10-CM

## 2020-05-28 DIAGNOSIS — I2699 Other pulmonary embolism without acute cor pulmonale: Secondary | ICD-10-CM

## 2020-05-28 LAB — CBC
HCT: 28.7 % — ABNORMAL LOW (ref 36.0–46.0)
Hemoglobin: 9.6 g/dL — ABNORMAL LOW (ref 12.0–15.0)
MCH: 29.2 pg (ref 26.0–34.0)
MCHC: 33.4 g/dL (ref 30.0–36.0)
MCV: 87.2 fL (ref 80.0–100.0)
Platelets: 190 10*3/uL (ref 150–400)
RBC: 3.29 MIL/uL — ABNORMAL LOW (ref 3.87–5.11)
RDW: 12 % (ref 11.5–15.5)
WBC: 15.7 10*3/uL — ABNORMAL HIGH (ref 4.0–10.5)
nRBC: 0.1 % (ref 0.0–0.2)

## 2020-05-28 LAB — LACTIC ACID, PLASMA: Lactic Acid, Venous: 0.8 mmol/L (ref 0.5–1.9)

## 2020-05-28 LAB — COMPREHENSIVE METABOLIC PANEL
ALT: 56 U/L — ABNORMAL HIGH (ref 0–44)
AST: 71 U/L — ABNORMAL HIGH (ref 15–41)
Albumin: 2.4 g/dL — ABNORMAL LOW (ref 3.5–5.0)
Alkaline Phosphatase: 44 U/L (ref 38–126)
Anion gap: 13 (ref 5–15)
BUN: 8 mg/dL (ref 6–20)
CO2: 19 mmol/L — ABNORMAL LOW (ref 22–32)
Calcium: 8.5 mg/dL — ABNORMAL LOW (ref 8.9–10.3)
Chloride: 99 mmol/L (ref 98–111)
Creatinine, Ser: 0.6 mg/dL (ref 0.44–1.00)
GFR calc Af Amer: >60 mL/min (ref >60)
GFR calc non Af Amer: >60 mL/min (ref >60)
Glucose, Bld: 115 mg/dL — ABNORMAL HIGH (ref 70–99)
Potassium: 3.9 mmol/L (ref 3.5–5.1)
Sodium: 131 mmol/L — ABNORMAL LOW (ref 135–145)
Total Bilirubin: 0.3 mg/dL (ref 0.3–1.2)
Total Protein: 8.2 g/dL — ABNORMAL HIGH (ref 6.5–8.1)

## 2020-05-28 LAB — BASIC METABOLIC PANEL
Anion gap: 12 (ref 5–15)
BUN: 9 mg/dL (ref 6–20)
CO2: 18 mmol/L — ABNORMAL LOW (ref 22–32)
Calcium: 8.2 mg/dL — ABNORMAL LOW (ref 8.9–10.3)
Chloride: 99 mmol/L (ref 98–111)
Creatinine, Ser: 0.56 mg/dL (ref 0.44–1.00)
GFR calc Af Amer: >60 mL/min (ref >60)
GFR calc non Af Amer: >60 mL/min (ref >60)
Glucose, Bld: 115 mg/dL — ABNORMAL HIGH (ref 70–99)
Potassium: 4.1 mmol/L (ref 3.5–5.1)
Sodium: 129 mmol/L — ABNORMAL LOW (ref 135–145)

## 2020-05-28 LAB — URINALYSIS, ROUTINE W REFLEX MICROSCOPIC
Bilirubin Urine: NEGATIVE
Glucose, UA: NEGATIVE mg/dL
Hgb urine dipstick: NEGATIVE
Ketones, ur: NEGATIVE mg/dL
Nitrite: NEGATIVE
Protein, ur: 30 mg/dL — AB
Specific Gravity, Urine: 1.029 (ref 1.005–1.030)
pH: 5 (ref 5.0–8.0)

## 2020-05-28 LAB — FANA STAINING PATTERNS: Speckled Pattern: 24529 — ABNORMAL HIGH

## 2020-05-28 LAB — ANTINUCLEAR ANTIBODIES, IFA: ANA Ab, IFA: POSITIVE — AB

## 2020-05-28 LAB — CULTURE, BLOOD (ROUTINE X 2)
Culture: NO GROWTH
Culture: NO GROWTH
Special Requests: ADEQUATE
Special Requests: ADEQUATE

## 2020-05-28 LAB — C-REACTIVE PROTEIN: CRP: 28.8 mg/dL — ABNORMAL HIGH (ref <1.0)

## 2020-05-28 LAB — FERRITIN: Ferritin: 205 ng/mL (ref 11–307)

## 2020-05-28 LAB — D-DIMER, QUANTITATIVE: D-Dimer, Quant: 12.89 ug/mL-FEU — ABNORMAL HIGH (ref 0.00–0.50)

## 2020-05-28 LAB — PROCALCITONIN: Procalcitonin: 0.25 ng/mL

## 2020-05-28 LAB — MAGNESIUM: Magnesium: 2 mg/dL (ref 1.7–2.4)

## 2020-05-28 MED ORDER — HEPARIN (PORCINE) 25000 UT/250ML-% IV SOLN
1450.0000 [IU]/h | INTRAVENOUS | Status: DC
  Administered 2020-05-28: 900 [IU]/h via INTRAVENOUS
  Administered 2020-05-29: 1150 [IU]/h via INTRAVENOUS
  Administered 2020-05-30: 1350 [IU]/h via INTRAVENOUS
  Administered 2020-05-31 – 2020-06-01 (×2): 1450 [IU]/h via INTRAVENOUS
  Filled 2020-05-28 (×5): qty 250

## 2020-05-28 MED ORDER — VANCOMYCIN HCL 1500 MG/300ML IV SOLN
1500.0000 mg | Freq: Once | INTRAVENOUS | Status: DC
  Filled 2020-05-28: qty 300

## 2020-05-28 MED ORDER — VANCOMYCIN HCL IN DEXTROSE 1-5 GM/200ML-% IV SOLN: 1000.0000 mg | Freq: Three times a day (TID) | INTRAVENOUS | Status: DC

## 2020-05-28 MED ORDER — HEPARIN (PORCINE) 25000 UT/250ML-% IV SOLN: 10.0000 [IU]/kg/h | INTRAVENOUS | Status: DC

## 2020-05-28 MED ORDER — HEPARIN (PORCINE) 25000 UT/250ML-% IV SOLN: 950.0000 [IU]/h | INTRAVENOUS | Status: DC

## 2020-05-28 MED ORDER — IOHEXOL 350 MG/ML SOLN
75.0000 mL | Freq: Once | INTRAVENOUS | Status: AC | PRN
  Administered 2020-05-28: 60 mL via INTRAVENOUS

## 2020-05-28 MED ORDER — SODIUM CHLORIDE 0.9 % IV SOLN: 2.0000 g | Freq: Three times a day (TID) | INTRAVENOUS | Status: DC

## 2020-05-28 MED ORDER — LACTATED RINGERS IV BOLUS
1000.0000 mL | Freq: Once | INTRAVENOUS | Status: AC
  Administered 2020-05-28: 1000 mL via INTRAVENOUS

## 2020-05-28 NOTE — Progress Notes (Signed)
Bilateral lower extremity venous duplex completed. Refer to "CV Proc" under chart review to view preliminary results.  Preliminary results discussed with Dr. Pearlean Brownie.  05/28/2020 5:30 PM Eula Fried., MHA, RVT, RDCS, RDMS

## 2020-05-28 NOTE — Progress Notes (Addendum)
Notified by RN-heart rate running in the 200s 210s.  Part of this was probably spurious due to some lead misplacement. Patient has been tachycardic now for a while. Earlier thought process documented in the note before-wait and watch has been going on for a while. Given her young age, I would request critical care team assess her again and help [place] out with any new recommendations. I have reached out to PCCM-Dr. Stretch, and he will have his team take a look and get back to [place].  -- Milon Dikes, MD [facility] Pager: [phone] If 7pm to 7am, please call on call as listed on AMION.

## 2020-05-28 NOTE — Progress Notes (Signed)
Appreciated by PCCM, Dr. Jayme Cloud. Appreciate her evaluating the patient. Recommendations to check some labs, x-rays etc. We will hold off on DVT prophylaxis for now-given persistent hemorrhage in the brain. Have ordered lower extremity Dopplers-previous ones that did not reported DVT, were incomplete-would like to obtain complete Dopplers to see if they can somehow explain fevers. \  -- Milon Dikes, MD [facility] Pager: [phone] If 7pm to 7am, please call on call as listed on AMION.

## 2020-05-28 NOTE — Progress Notes (Signed)
Physical Therapy Treatment Patient Details Name: Gail Montgomery MRN: [id number] DOB: 1996/04/24 Today's Date: 05/28/2020    History of Present Illness 24 yo who had acute R sided weakness and aphasia 05/19/20 due to LVO at [facility]. Screened Covid + but asymptomatic; tPA and underwent thrombectomy; during procedure had ICA vasospasm vs dissection.  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 Pt self extubated 05/20/20.     PT Comments    On arrival patient returning from CT per RN. RN okayed attempting PT. Assisted patient with rolling rt and lt for placing cooling blanket. Noted RLE stiffness due to resting in flexor synergy pattern (hip abduction, external rotation, flexion). Performed PROM to RLE and positioned with towel rolls in neutral position, including ankle DF and towards pronation (pt tending to supinate). Attempted having pt follow one step commands with left extremities without success. Patient alert and turns to look left when name stated. PROM to remaining extremities.    Follow Up Recommendations  CIR     Equipment Recommendations  Wheelchair (measurements PT);Wheelchair cushion (measurements PT);Hospital bed;3in1 (PT)    Recommendations for Other Services       Precautions / Restrictions Precautions Precautions: Fall Precaution Comments: right sided weakness (R leg most significantly)    Mobility  Bed Mobility Overal bed mobility: Needs Assistance Bed Mobility: Rolling Rolling: Max assist         General bed mobility comments: RN needing to put cooling blanket under patient; assisted with rolling rt and left (to her right she assisted with final 1/3 of turn onto her side; no assist from pt rolling to her left)  Transfers                 General transfer comment: not attempted; pt with elevated temp and HR; not following commands    Ambulation/Gait                 Stairs             Wheelchair Mobility    Modified Rankin (Stroke Patients Only)       Balance                                            Cognition Arousal/Alertness: Awake/alert Behavior During Therapy: Flat affect Overall Cognitive Status: Difficult to assess                     Current Attention Level: Focused   Following Commands:  (not following commands today)       General Comments: Patient just returning from head CT; RN denied any sedating meds. Patient would open eyes to name.      Exercises Other Exercises Other Exercises: RLE being held in flexor synergy-stretched in opposite directions (hip adduction, internal rotation, extension; knee extension; ankle dorsiflexion and pronation). Noted rt heel cord tightening and able to achieve ~ 15 degrees DF with prolonged stretches (both with knee extended and knee flexed).     General Comments General comments (skin integrity, edema, etc.): HR max 133 bpm with rolling.      Pertinent Vitals/Pain Pain Assessment: Faces Faces Pain Scale: Hurts even more Pain Location: generalized grimace, noted only with RLE movement Pain Descriptors / Indicators: Grimacing Pain Intervention(s): Limited activity within patient's tolerance;Monitored during session;Repositioned  Home Living                      Prior Function            PT Goals (current goals can now be found in the care plan section) Acute Rehab PT Goals Patient Stated Goal: mother would like her to return home with mom at d/c Time For Goal Achievement: 06/04/20 Potential to Achieve Goals: Good Progress towards PT goals: Not progressing toward goals - comment (elevated temp and HR; )    Frequency    Min 4X/week      PT Plan Current plan remains appropriate    Co-evaluation              AM-PAC PT "6 Clicks" Mobility   Outcome Measure  Help needed turning  from your back to your side while in a flat bed without using bedrails?: Total Help needed moving from lying on your back to sitting on the side of a flat bed without using bedrails?: Total Help needed moving to and from a bed to a chair (including a wheelchair)?: Total Help needed standing up from a chair using your arms (e.g., wheelchair or bedside chair)?: Total Help needed to walk in hospital room?: Total Help needed climbing 3-5 steps with a railing? : Total 6 Click Score: 6    End of Session   Activity Tolerance: Treatment limited secondary to medical complications (Comment) (elevated temperature and HR) Patient left: in bed;with call bell/phone within reach;with bed alarm set;Other (comment) (RLE with towel rolls laterally to reduce ext rotation; )   PT Visit Diagnosis: Muscle weakness (generalized) (M62.81);Difficulty in walking, not elsewhere classified (R26.2);Hemiplegia and hemiparesis Hemiplegia - Right/Left: Right Hemiplegia - dominant/non-dominant: Dominant Hemiplegia - caused by: Cerebral infarction;Nontraumatic intracerebral hemorrhage     Time: 9509-3267 PT Time Calculation (min) (ACUTE ONLY): 26 min  Charges:  $Therapeutic Exercise: 8-22 mins $Neuromuscular Re-education: 8-22 mins                      Jerolyn Center, PT Pager [phone]    Zena Amos 05/28/2020, 4:21 PM

## 2020-05-28 NOTE — Progress Notes (Signed)
Video call with mother and other family. Patient non verbal but shaking her head appropriately. Emotional support provided and Bedside RN, Tvedt had already provided update.

## 2020-05-28 NOTE — Progress Notes (Signed)
NAME:  Gail Montgomery, MRN:  [id number], DOB:  10-16-1995, LOS: 9 ADMISSION DATE:  05/19/2020, CONSULTATION DATE:  05/28/2020 REFERRING MD:  Aroor CHIEF COMPLAINT: Fever , tachycardia  Brief History   24 year old female without significant PMH on BCP, some recreational mariajuana use. She was normal upon waking the morning of  8/21.  At 09:30 she had sudden onset of right hemiparesis, and aphasia. She presented 8/21/2021to the [facility] ED as a Code Stroke  with acute left MCA syndrome. She received tPA at 11:29 am she was deemed a  candidate for thrombectomy, and was taken to IR.  Developed SAH subsequently.  PCCM was initially consulted for vent management, patient apparently self extubated on 8/22.   PCCM was not following, then apparently asked to reconsult for fever and tachycardia on 8/27, and there have been no notes since.    History of present illness   24 year old female normal upon waking up this morning 8/21 2021. At 09:30 she had sudden onset of right hemiparesis, and aphasia. She presents 8/21/2021to the [facility] ED as a Code Stroke  with acute left MCA syndrome.She was screened as Covid +, but was asymptomatic.  She received tPA at 11:29 am , she was deemed a  candidate for thrombectomy, and was taken to IR..In IR she had successful  opening of her LMCA with reperfusion, but LACA has a residual small distal anterior cerebral clot.  During procedure there was  ICA vasospasm vs dissection. She was loaded with ASA and Plavix. There was concern that she would need carotid  stenting . However upon re-imaging the vessel was open, and she did not require stent.  SAH, Parenchymal hemorrhage, and intraventricular hemorrhage.   She has had persistent, intermittent fevers since 8/24.   Also has had sinus tachycardia, from 110s to 130s, occasionally 140s 150s.  Tachy seems to correlate with the severity of fever mostly, with some outliers.    On zosyn and vanc from 8/25-8/27 Thought fevers possibly 2/2  covid vs stroke, abx stopped 8/27.  Good po intake, good UOP>   Current meds: nimodipine 60 q 4, protonix, thiamine, b12 Tylenol q 4 650mg   Blood and urine cultures 8/25 negative  Past Medical History   Past Medical History:  Diagnosis Date  . Anxiety   . Panic attack     Significant Hospital Events   05/19/2020 Admission to [facility] with acute L MCA syndrome  Consults:  8/21 PCCM, 8/27 reconsult, 8/30 reconsult   Procedures:  8/21: Mechanical Thrombectomy   Significant Diagnostic Tests:  05/19/2020 CTA Brain Cut off at the left ICA terminus with visible luminal clot. There is continuation of non opacification throughout the left M1 segment which is clot by CT. Non opacification at the left A1 origin. No contralateral branch occlusion is seen. Intravascular enhancement seen in the left operculum branches on the delayed phase.  8/30 CTA chest> bilateral segmental lower lobe pulmonary emboli L>R. Equivocal segmental emboli in L upper lobe. Mild cardiomegaly   Micro Data:  05/19/2020  SARS Coronavirus 2 NEGATIVE POSITIVEAbnormal   05/19/2020 >>> Blood Cultures x 2  8/25 blood cultures negative  Urine culture negative Antimicrobials:  8/30 Vanc> 8/30 Cefepime>  Interim history/subjective:  Found to have acute PE PCCM re-engaged for close monitoring in setting of anticoagulation, recent ICH  Objective   Blood pressure 137/85, pulse (!) 110, temperature (!) 101.2 F (38.4 C), temperature source Rectal, resp. rate 20, height 5\' 4"  (1.626 m), weight 84.2 kg, SpO2  97 %.        Intake/Output Summary (Last 24 hours) at 05/28/2020 1706 Last data filed at 05/28/2020 0000 Gross per 24 hour  Intake --  Output 650 ml  Net -650 ml   Filed Weights   05/19/20 1100  Weight: 84.2 kg    Examination:  General: WDWN young adult F, reclined in bed., shivering HENT: sclerae intact Lungs: Symmetrical chest expansion, no accessory muscle use on RA  Cardiovascular: Tachycardic rate  regular rhythm  Abdomen: soft.  Extremities: no swelling Neuro:  Awake but not verbal, not following commands for me.   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 -Bubble study with spencer grade 3 pfo  P Stroke team following Frequent neuro exams -- starting heparin for acute PE Repeat CT H per neuro  Nimodipine   Acute Pulmonary Embolism - covid 19, not on dvt ppx in setting of ICH  -Mild cardiomegaly on CTA chest  P Heparin per pharmacy Close neuro monitoring in setting of ICH  Lower extremity dvt study  ECHO ordered 8/30 by [facility]  COVID-19 positive -incidental finding, fully vaccinated and without evidence of PNA P Continue contact/airborn precautions Continuous SpO2 monitoring  SIRS: Tachycardia, Fevers, mild leukocytosis -ddx infection vs reactive (in setting of PE) or central fevers (in setting of intracranial process) PCT with slight increase from < 0.1 to 0.25  P  Trend CBC WBC Started on vanc and cefepime by [facility] 8/30; hoping we will be able to rapidly de-escalate  repeat BCx pending Ice/cooling measures for fever as tolerates  Best practice:  Diet: Full diet Pain/Anxiety/Delirium protocol (if indicated): No  sedation  VAP protocol (if indicated): Not intubated DVT prophylaxis: None for now, received TPA plavix and ASA GI prophylaxis: Protonix Glucose control: CBG and SSI Mobility: BR Code Status: Full Family Communication: Family has been updated 8/30 Dispo: ICU in setting of recent ICH and acute PE requiring anticoagulation   Labs   CBC: Recent Labs  Lab 05/22/20 0356 05/23/20 0900 05/24/20 0524 05/25/20 0506 05/26/20 0539 05/27/20 0629 05/28/20 0151  WBC 12.2*   < > 11.0* 14.0* 13.9* 17.0* 15.7*  NEUTROABS 8.9*  --   --   --   --   --   --   HGB 10.4*   < > 11.4* 10.6* 10.1* 10.3* 9.6*  HCT 33.5*   < > 35.6* 33.8*  31.7* 31.7* 28.7*  MCV 91.8   < > 90.6 90.1 91.1 89.8 87.2  PLT 209   < > 165 137* 129* 148* 190   < > = values in this interval not displayed.    Basic Metabolic Panel: Recent Labs  Lab 05/22/20 0356 05/23/20 0900 05/25/20 0506 05/26/20 0539 05/27/20 0629 05/28/20 0151 05/28/20 0859  NA 150*   < > 138 138 133* 129* 131*  K 3.2*   < > 3.5 3.8 3.8 4.1 3.9  CL 116*   < > 109 110 103 99 99  CO2 22   < > 19* 19* 18* 18* 19*  GLUCOSE 127*   < > 120* 87 123* 115* 115*  BUN 6   < > 8 10 10 9 8   CREATININE 0.54   < > 0.65 0.66 0.64 0.56 0.60  CALCIUM 8.8*   < > 8.1* 7.9* 8.3* 8.2* 8.5*  MG 1.8  --   --   --   --  2.0  --   PHOS 3.2  --   --   --   --   --   --    < > = values in this interval not displayed.   GFR: Estimated Creatinine Clearance: 115.8 mL/min (by C-G formula based on SCr of 0.6 mg/dL). Recent Labs  Lab 05/25/20 0506 05/26/20 0539 05/26/20 1300 05/27/20 0629 05/28/20 0151 05/28/20 0438 05/28/20 1225  PROCALCITON  --   --  <0.10  --   --   --  0.25  WBC 14.0* 13.9*  --  17.0* 15.7*  --   --   LATICACIDVEN  --   --   --   --   --  0.8  --     Liver Function Tests: Recent Labs  Lab 05/22/20 0356 05/28/20 0859  AST 17 71*  ALT 13 56*  ALKPHOS 48 44  BILITOT 0.3 0.3  PROT 7.3 8.2*  ALBUMIN 2.7* 2.4*   No results for input(s): LIPASE, AMYLASE in the last 168 hours. No results for input(s): AMMONIA in the last 168 hours.  ABG    Component Value Date/Time   PHART 7.401 05/19/2020 1732   PCO2ART 33.8 05/19/2020 1732   PO2ART 394 (H) 05/19/2020 1732   HCO3 21.3 05/19/2020 1732   TCO2 22 05/19/2020 1732   ACIDBASEDEF 3.0 (H) 05/19/2020 1732   O2SAT 100.0 05/19/2020 1732     Coagulation Profile: No results for input(s): INR, PROTIME in the last 168 hours.  Cardiac Enzymes: No results for input(s): CKTOTAL, CKMB, CKMBINDEX, TROPONINI in the last 168 hours.  HbA1C: Hgb A1c MFr Bld  Date/Time Value Ref Range Status  05/20/2020 09:58 AM 5.6 4.8 -  5.6 % Final    Comment:    (NOTE) Pre diabetes:          5.7%-6.4%  Diabetes:              >6.4%  Glycemic control for   <7.0% adults with diabetes     CBG: No results for input(s): GLUCAP in the last 168 hours.   Lynnell Catalan, MD [facility] ICU Physician [facility]  Pager: [phone] Mobile: [phone] After hours: [phone].  05/28/2020, 5:52 PM

## 2020-05-28 NOTE — Progress Notes (Signed)
MD notified of patient's MEWS score. The nurse will continue to monitor.

## 2020-05-28 NOTE — Plan of Care (Signed)
  Problem: Education: Goal: Knowledge of secondary prevention will improve Outcome: Progressing Goal: Knowledge of patient specific risk factors addressed and post discharge goals established will improve Outcome: Progressing Goal: Individualized Educational Video(s) Outcome: Progressing   Problem: Coping: Goal: Will identify appropriate support needs Outcome: Progressing   Problem: Self-Care: Goal: Verbalization of feelings and concerns over difficulty with self-care will improve Outcome: Progressing   Problem: Intracerebral Hemorrhage Tissue Perfusion: Goal: Complications of Intracerebral Hemorrhage will be minimized Outcome: Progressing   Problem: Ischemic Stroke/TIA Tissue Perfusion: Goal: Complications of ischemic stroke/TIA will be minimized Outcome: Progressing   Problem: Spontaneous Subarachnoid Hemorrhage Tissue Perfusion: Goal: Complications of Spontaneous Subarachnoid Hemorrhage will be minimized Outcome: Progressing   Problem: Education: Goal: Knowledge of General Education information will improve Description: Including pain rating scale, medication(s)/side effects and non-pharmacologic comfort measures Outcome: Progressing   Problem: Health Behavior/Discharge Planning: Goal: Ability to manage health-related needs will improve Outcome: Progressing   Problem: Clinical Measurements: Goal: Ability to maintain clinical measurements within normal limits will improve Outcome: Progressing Goal: Will remain free from infection Outcome: Progressing Goal: Diagnostic test results will improve Outcome: Progressing Goal: Respiratory complications will improve Outcome: Progressing Goal: Cardiovascular complication will be avoided Outcome: Progressing   Problem: Activity: Goal: Risk for activity intolerance will decrease Outcome: Progressing   Problem: Nutrition: Goal: Adequate nutrition will be maintained Outcome: Progressing   Problem: Coping: Goal: Level of  anxiety will decrease Outcome: Progressing   Problem: Elimination: Goal: Will not experience complications related to bowel motility Outcome: Progressing Goal: Will not experience complications related to urinary retention Outcome: Progressing   Problem: Pain Managment: Goal: General experience of comfort will improve Outcome: Progressing   Problem: Safety: Goal: Ability to remain free from injury will improve Outcome: Progressing   Problem: Skin Integrity: Goal: Risk for impaired skin integrity will decrease Outcome: Progressing   

## 2020-05-28 NOTE — Progress Notes (Signed)
STROKE TEAM PROGRESS NOTE   INTERVAL HISTORY Patient is still nonverbal but interacting with facial expressions, no events overnight, the only IV she is on is fluids, at this time we will going to transfer her to 3W. She remains tachycardic and febrile with T-max 103.1.  No obvious source of infection noted so far. OBJECTIVE Vitals:   05/28/20 0152 05/28/20 0337 05/28/20 0610 05/28/20 0710  BP: 133/71 108/88 (!) 71/50 127/90  Pulse:      Resp: 18 20 (!) 26 20  Temp: (!) 102.1 F (38.9 C) (!) 103.1 F (39.5 C)  (!) 100.7 F (38.2 C)  TempSrc: Oral Oral  Axillary  SpO2: 95% 99% 96% 97%  Weight:      Height:       CBC:  Recent Labs  Lab 05/21/20 1220 05/21/20 1220 05/22/20 0356 05/23/20 0900 05/27/20 0629 05/28/20 0151  WBC 10.9*   < > 12.2*   < > 17.0* 15.7*  NEUTROABS 8.3*  --  8.9*  --   --   --   HGB 11.2*   < > 10.4*   < > 10.3* 9.6*  HCT 35.4*   < > 33.5*   < > 31.7* 28.7*  MCV 91.2   < > 91.8   < > 89.8 87.2  PLT 243   < > 209   < > 148* 190   < > = values in this interval not displayed.   Basic Metabolic Panel:  Recent Labs  Lab 05/22/20 0356 05/23/20 0900 05/28/20 0151 05/28/20 0859  NA 150*   < > 129* 131*  K 3.2*   < > 4.1 3.9  CL 116*   < > 99 99  CO2 22   < > 18* 19*  GLUCOSE 127*   < > 115* 115*  BUN 6   < > 9 8  CREATININE 0.54   < > 0.56 0.60  CALCIUM 8.8*   < > 8.2* 8.5*  MG 1.8  --  2.0  --   PHOS 3.2  --   --   --    < > = values in this interval not displayed.   Lipid Panel:     Component Value Date/Time   CHOL 156 05/20/2020 0958   TRIG 105 05/20/2020 0958   TRIG 104 05/20/2020 0958   HDL 57 05/20/2020 0958   CHOLHDL 2.7 05/20/2020 0958   VLDL 21 05/20/2020 0958   LDLCALC 78 05/20/2020 0958   HgbA1c:  Lab Results  Component Value Date   HGBA1C 5.6 05/20/2020   Urine Drug Screen:     Component Value Date/Time   LABOPIA NONE DETECTED 05/20/2020 1736   COCAINSCRNUR NONE DETECTED 05/20/2020 1736   LABBENZ NONE DETECTED  05/20/2020 1736   AMPHETMU NONE DETECTED 05/20/2020 1736   THCU POSITIVE (A) 05/20/2020 1736   LABBARB NONE DETECTED 05/20/2020 1736    Alcohol Level No results found for: [facility]  IMAGING  CT HEAD CODE STROKE WO CONTRAST 05/19/2020 Dense left ICA terminus and MCA. No acute hemorrhage. ASPECTS is 10.   CT Code Stroke CTA Head W/WO contrast CT Code Stroke CTA Neck W/WO contrast 05/19/2020 1. 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  2. Mildly indistinct appearance of the proximal left ICA is likely from motion artifact, consider cervical run. No atheromatous changes or vasculopathy seen in the neck.   Neuro Interventional Radiology - Cerebral Angiogram with Intervention - Dr Loreta Ave 05/19/20 2:27 PM Left  MCA: Baseline TICI 0 First Pass Device:                 Local aspiration and solitaire 4 x 40              Result                         TICI 0 Second Pass Device:           Local aspiration and solitaire 4 x 40              Result                         TICI 3 Left ACA/pericallosal  Baseline TICI 0,    Final TICI 0 pericallosal artery Findings:  Patent right CFA Left M1 occlusion, final is TICI 3  Left ACA A2 occlusion migrated to pericallosal artery during the case.  Could not complete a first pass safely.  Significant irregularity of the cervical ICA, concerning for dissection, that resolved after observation, and once DAPT was initiated.  The final appearance may represent spasm vs FMD/vasculitis  CT HEAD WO CONTRAST 05/19/2020 New large volume of subarachnoid and intraventricular hemorrhage.  CT HEAD WO CONTRAST 05/20/2020 1. Subarachnoid hemorrhage along the anterior interhemispheric fissure with slightly increased extension into both lateral ventricles.  2. Slight increase in size of the temporal horn of the left lateral ventricle consistent with early communicating hydrocephalus.   MR BRAIN WO CONTRAST 05/20/2020 1. Early subacute  infarct of the left basal ganglia involving the posterior putamen and caudate body.  2. Subarachnoid hemorrhage along the interhemispheric fissure extending into the lateral ventricles.  3. 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   CT HEAD WO CONTRAST 05/21/2020 1. Stable appearance of left basal ganglia nonhemorrhagic infarction. 2. Stable medial left frontal extra-axial hemorrhage, extending into the ventricles bilaterally. 3. Subarachnoid hemorrhage posteriorly on the left and along the inter cerebral hemisphere likely represents redistribution without significant new hemorrhage. 4. Hemorrhage is present within the aqueduct of Sylvius. 5. Progressive dilation of the ventricles compatible with developing hydrocephalus. 6. No new areas of hemorrhage.   CT HEAD WO CONTRAST 05/22/2020 1. Regressed intraventricular hemorrhage since yesterday with small volume residual. Stable mild ventriculomegaly, and possible mild transependymal edema. 2. Stable left inferior frontal gyrus parasagittal hematoma (approximately 12 mL) and regional edema. No significant midline shift, basilar cisterns are patent. 3. Stable left basal ganglia infarcts. 4. No new intracranial abnormality.   CT HEAD WO CONTRAST 05/24/20 IMPRESSION: 1. 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 2. Unchanged intracranial hemorrhage with mild lateral ventriculomegaly.  ECHOCARDIOGRAM COMPLETE 05/20/2020 1. Left ventricular ejection fraction, by estimation, is 60 to 65%. The left ventricle has normal function. The left ventricle has no regional wall motion abnormalities. Left ventricular diastolic parameters were normal.   2. Right ventricular systolic function is normal. The right ventricular size is normal.   3. The mitral valve is normal in structure. Trivial mitral valve regurgitation. No evidence of mitral stenosis.   4. The aortic valve is normal in structure. Aortic valve  regurgitation is not visualized. No aortic stenosis is present.   5. The inferior vena cava is normal in size with greater than 50% respiratory variability, suggesting right atrial pressure of 3 mmHg.   Bilateral Lower Extremity Venous Dopplers  05/21/2020 RIGHT: - There is no evidence of  deep vein thrombosis in the lower extremity. However, portions of this examination were limited- see technologist comments above.  - No cystic structure found in the popliteal fossa.   LEFT: - There is no evidence of deep vein thrombosis in the lower extremity. However, portions of this examination were limited- see technologist comments above.  - No cystic structure found in the popliteal fossa.    Transcranial Doppler w/ Bubble 05/25/2020 Not cooperative for Valsalva.  Bubble study showed Spencer degree 3 at rest.  Recommend TEE for further confirmation.  Consider TEE after Covid isolation and before CIR discharge   PHYSICAL EXAM  Temp:  [100.2 F (37.9 C)-103.2 F (39.6 C)] 100.7 F (38.2 C) (08/30 0710) Pulse Rate:  [124-155] 130 (08/29 2325) Resp:  [15-32] 20 (08/30 0710) BP: (71-143)/(50-119) 127/90 (08/30 0710) SpO2:  [95 %-100 %] 97 % (08/30 0710)  General - Well nourished, well developed, lethargic and flat effect  Ophthalmologic - fundi not visualized due to noncooperation.   Cardiovascular - regular rhythm and rate  Neurological exam (stable)- lethargic with flat affect, eyes open, nonverbal, did not repeat sentences with me today or answer any questions or say her name but can use expressions to answer some questions, she will raise eyebrows and shrug.  Able to follow simple commands on both hands and left foot. Able to have bilateral gaze with voice on both sides, however right gaze incomplete, still has left gaze preference. PERRL. Blinking to visual threat bilaterally. Right facial droop. Tongue protrusion not cooperative. LUE spontaneous movement and LLE strong withdraw to pain. RUE  proximal 4/5, distal finger grip 4/5. RLE plegic with only slight withdraw to pain. Sensation, coordination not cooperative and gait not tested.   ASSESSMENT/PLAN Ms. Gail Montgomery is a 24 y.o. female with history of tobacco use, anxiety (panic attacks) and hormonal birth control presented with right-sided weakness and aphasia that started abruptly at 9:30 AM.  COVID - positive. IV t-PA - Saturday 05/19/20 at 1145. Thrombectomy - Dr Loreta Ave - Left M1 occlusion, final is TICI 3.   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 Paradoxical emboli in the setting of PFO - DVT neg 3. COVID hypercoagulability in the setting of OCP use and intermittent smoking - however, COVID Ab high titer  CT Head - Dense left ICA terminus and MCA. No acute hemorrhage. ASPECTS is 10.      CTA H&N - Emergent large vessel occlusion at the left ICA terminus continuing into the MCA. Mildly indistinct appearance of the proximal left ICA is likely from motion artifact, consider cervical run. No atheromatous changes or vasculopathy seen in the neck.  IR - left MCA and A2 occlusion s/p TICI3 of MCA and TICI0 of ACA. Initially left ICA stenosis post IR but resolved on the final run  CT head 8/21 - New large volume of subarachnoid and intraventricular hemorrhage.   CT head 8/22 [facility] along the anterior interhemispheric fissure with slightly increased extension into both lateral ventricles. Slight increase in size of the temporal horn of the left lateral ventricle consistent with early communicating hydrocephalus.   MRI head 8/22 -  Early subacute infarct of the left basal ganglia involving the posterior putamen and caudate body.  CT head 8/23 - stable L basal ganglia infarct,  ICH/IVH/SAH. Progressive dilation of ventricles. CT head 8/24 -  regressed IVH. Stable infarct and frontal ICH. Stable ventriculomegaly CT head 8/26 - stable infarcts & hemorrhage w/ mild ventriculomegaly  LE Venous Dopplers - no DVT  2D Echo EF 60-65%  TCD bubble study Spencer degree 3 at rest.  Not cooperative for Valsalva.  Sars Corona Virus 2 - positive  LDL - 78  HgbA1c 5.6  Hypercoagulable and autoimmune labs ANCA proteinase 3 ab 6.0 (h) ANA positive - C - ANCA titers < 1:20 (neg)  ;  P - ANCA titers < 1:20 (neg)  UDS - THC positive  VTE prophylaxis - SCDs  No antithrombotic prior to admission, now on No antithrombotic [facility]  Ongoing aggressive stroke risk factor management  Therapy recommendations:  CIR   Disposition:  Pending   Hydrocephalus, mild  CT head showed mildly increased size of left temporal horn   CT repeat stable hemorrhage but mild progression of hydrocephalus  CT head 8/24 - regressed IVH. Stable infarct and frontal ICH. Stable mild ventriculomegaly  NSG on board  3% hypertonic saline - 50 cc/hr ->25cc->10cc -> off  Na - 139->149->154->148->152->150->140->139->138->138->133  PICC placed 8/23   Repeat CT on 8/26 stable  PFO  TCD bubble study showed Spencer degree 3 at rest.  Not cooperative with Valsalva  Consider TEE after Covid isolation and before CIR discharge  If PFO confirmed, consider to refer to Dr. Excell Seltzerooper cardiology for PFO closure.  BP management  Stable  [facility] discussed with Dr. Conchita ParisNundkumar, will keep nimodipine while inpt - d/c on discharge if neuro stable  SBP goal < 160 given SAH and IVH (SBP 99 - 125 this AM) . Long-term BP goal normotensive  Hyperlipidemia  Home Lipid lowering medication: none   LDL 78, goal < 70  No statin at this time due [facility]  Consider low dose statin at discharge  ? Hypercoagulable state   COVID positive - asymptomatic  D-dimer > 20->16.13->17.44-> >20-> >20   OCP user with smoking and THC  use  Hypercoagulable work up (so far) - ANCS proteinase 3 - 6.0 (H) (0.0 - 3.5)   - C - ANCA titers < 1:20 (neg)  ;  P - ANCA titers < 1:20 (neg)  Myeloperoxidase Abs - < 9.0 (WNL)  COVID - 19 infection  COVID + test 8/21  Asymptomatic  Ferritin 24->30->37->51->93->92  Fibrinogen 289  CRP 0.8->2.4->3.3->3.9->8.7->9.1   D-dimer > 20->16.13->17.44-> >20-> >20 -> >20   COVID Ab titer > 2500  Per ID, ok to be off isolation in 10 days following + test as asymptomatic (05/29/2020)  Leukocytosis, Fever  Etiology unknown  TMax 101.2->103.1->100.1->102.3->99.1->103->102->101.4  WBC 10.9->12.2->12.5->11.0->14.0->13.9->17.0  UA small LE, rare bact, 0-5 WBC - neg  CXR neg  Urine culture - 8/25 - no growth  Blood Cx - 8/25 - NG x 3 days - final pending  On vanco and zosyn empiric treatment 8/25>>discontinued 8/27 - now off all abxs  CCM on board  Tachycardia  HR 121-153  Likely due to fever  Continue monitoring  Euthyroid sick syndrome  Low TSH 0.054   normal free T4 and T3  Likely related to critical illness  Tobacco abuse  Intermittent smoking per mom  Smoking cessation counseling will be provided  Other Stroke Risk Factors  ETOH use, advised to drink no more than 1 alcoholic beverage per day  Obesity, Body mass index is 31.86 kg/m., recommend weight loss,  diet and exercise as appropriate   Hormonal birth control - both progesterone and estrogen - Estarylla  Other Active Problems  Code status - Full code  Mild anemia 11.2->10.4->11.6->11.4->10.6->10.1->10.3  Hypokalemia 3.3->4.4->3.2 ->3.4 ->3.1->3.5 - supplement - 3.8->3.8  Low normal B12 level 300 - supplement  Thrombocytopenia - 209->137->129->148  PICC line placed 8/23  Hospital day # 9   Patient remains tachycardic and febrile with no clear source obtained.  Recommend check CT scan of the chest to rule out pulmonary embolism.  CT scan confirms left lower lobe pulmonary emboli.   Patient had intracranial hemorrhage following IV TPA and mechanical thrombectomy 1 week ago and remains at risk for increasing hemorrhage with full anticoagulation however due to her hypercoagulable state with Covid and recent embolic stroke as well as no pulmonary embolism she will benefit with anticoagulation.  I had a long discussion over the phone with the patient's mother and father regarding this delicate situation and the need to anticoagulate despite recent intracerebral hemorrhage which hopefully was a one-time event caused by the TPA and the mechanical thrombectomy procedure.  Recommend transfer to intensive care unit and start IV heparin stroke protocol with close monitoring by pharmacy to keep heparin level in the lower therapeutic range.  Repeat CT scan of the head without contrast tomorrow morning.  Discussed with Dr. Elijah Birk and Dr. Lynnell Catalan critical care medicine. This patient is critically ill and at significant risk of neurological worsening, death and care requires constant monitoring of vital signs, hemodynamics,respiratory and cardiac monitoring, extensive review of multiple databases, frequent neurological assessment, discussion with family, other specialists and medical decision making of high complexity.I have made any additions or clarifications directly to the above note.This critical care time does not reflect procedure time, or teaching time or supervisory time of PA/NP/Med Resident etc but could involve care discussion time.  I spent 40 minutes of neurocritical care time  in the care of  this patient.    Gail Heady, MD    To contact Stroke Continuity provider, please refer to [email or web address]. After hours, contact General Neurology

## 2020-05-28 NOTE — Progress Notes (Signed)
Pharmacy Antibiotic Note  Gail Montgomery is a 24 y.o. female admitted on 05/19/2020 with sepsis.  Pharmacy has been consulted for vancomycin and cefepime dosing.  Found to have L MCA syndrome now s/p IR with subseqent SAH. Continues to have fever, Tmax in 24 hr 103.2. LA 0.8, CRP 28.8, PCT 0.25, Scr 0.6 (CrCl >100 mL/min). WBC 15.7. CXR without active dx. Cx have been negative - recultured today, MRSA PCR neg on 8/22. Of note, found to have PE today on CT.   Plan: Cefepime 2g IV every 8 hours  Vancomycin 1500 mg then 1g IV every 8 hours  Monitor renal fx, cx results, clinical pic, and opportunities to de-escalate   Height: 5\' 4"  (162.6 cm) Weight: 84.2 kg (185 lb 10 oz) IBW/kg (Calculated) : 54.7  Temp (24hrs), Avg:102.1 F (38.9 C), Min:100.2 F (37.9 C), Max:103.2 F (39.6 C)  Recent Labs  Lab 05/24/20 0524 05/24/20 0524 05/25/20 0506 05/26/20 0539 05/27/20 0629 05/28/20 0151 05/28/20 0438 05/28/20 0859  WBC 11.0*  --  14.0* 13.9* 17.0* 15.7*  --   --   CREATININE 0.62   < > 0.65 0.66 0.64 0.56  --  0.60  LATICACIDVEN  --   --   --   --   --   --  0.8  --    < > = values in this interval not displayed.    Estimated Creatinine Clearance: 115.8 mL/min (by C-G formula based on SCr of 0.6 mg/dL).    Allergies  Allergen Reactions  . 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    Antimicrobials this admission: Vanc 8/25 >>8/27, 8/30 Zosyn 8/25 >>8/27 Cefepime 8/30>>  Dose adjustments this admission: N/A  Microbiology results: 8/21 BCx - negative 8/22 MRSA PCR - negative 8/25 BCx - NGTD 8/30 UCx - sent 8/30 Bcx - sent   Thank you for allowing pharmacy to be a part of this patient's care.  9/30, PharmD, BCCCP Clinical Pharmacist  Phone: [phone] 05/28/2020 3:41 PM  Please check AMION for all [facility] Pharmacy phone numbers After 10:00 PM, call Main Pharmacy  [phone]

## 2020-05-28 NOTE — Progress Notes (Addendum)
ANTICOAGULATION CONSULT NOTE - Initial Consult  Pharmacy Consult for heparin Indication: 8/30 BL segmental PE   Allergies  Allergen Reactions  . 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    Patient Measurements: Height: 5\' 4"  (162.6 cm) Weight: 84.2 kg (185 lb 10 oz) IBW/kg (Calculated) : 54.7 Heparin Dosing Weight: 73kg  Vital Signs: Temp: 102.9 F (39.4 C) (08/30 1244) Temp Source: Axillary (08/30 1244) BP: 131/83 (08/30 1244) Pulse Rate: 136 (08/30 1244)  Labs: Recent Labs    05/26/20 0539 05/26/20 0539 05/27/20 0629 05/28/20 0151 05/28/20 0859  HGB 10.1*   < > 10.3* 9.6*  --   HCT 31.7*  --  31.7* 28.7*  --   PLT 129*  --  148* 190  --   CREATININE 0.66   < > 0.64 0.56 0.60   < > = values in this interval not displayed.    Estimated Creatinine Clearance: 115.8 mL/min (by C-G formula based on SCr of 0.6 mg/dL).  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 Patient with persistent intracranial hemorrhage per 8/26 CT head. Pharmacy consulted to start heparin no bolus.   Will target low end of goal as much as possible.   Goal of Therapy:  Heparin level 0.3- 0.5 units/ml Monitor platelets by anticoagulation protocol: Yes   Plan:  Start heparin at 900 units/hr, no bolus F/u 8hr HL  Monitor daily HL, CBC/plt Monitor for signs/symptoms of bleeding    9/26, PharmD, BCPS, BCCP Clinical Pharmacist  Please check AMION for all [facility] Pharmacy phone numbers After 10:00 PM, call Main Pharmacy [phone]

## 2020-05-28 NOTE — Progress Notes (Signed)
Patient transferred to 3M12 at this time, due to the need for increased level of care.

## 2020-05-28 NOTE — H&P (Addendum)
NAME:  Marbeth Smedley, MRN:  [id number], DOB:  June 16, 1996, LOS: 9 ADMISSION DATE:  05/19/2020, CONSULTATION DATE:  05/28/2020 REFERRING MD:  Aroor CHIEF COMPLAINT: Fever , tachycardia  Brief History   24 year old female without significant PMH on BCP, some recreational mariajuana use. She was normal upon waking the morning of  8/21.  At 09:30 she had sudden onset of right hemiparesis, and aphasia. She presented 8/21/2021to the [facility] ED as a Code Stroke  with acute left MCA syndrome. She received tPA at 11:29 am she was deemed a  candidate for thrombectomy, and was taken to IR.  Developed SAH subsequently.  PCCM was initially consulted for vent management, patient apparently self extubated on 8/22.   PCCM was not following, then apparently asked to reconsult for fever and tachycardia on 8/27, and there have been no notes since.    History of present illness   24 year old female normal upon waking up this morning 8/21 2021. At 09:30 she had sudden onset of right hemiparesis, and aphasia. She presents 8/21/2021to the [facility] ED as a Code Stroke  with acute left MCA syndrome.She was screened as Covid +, but was asymptomatic.  She received tPA at 11:29 am , she was deemed a  candidate for thrombectomy, and was taken to IR..In IR she had successful  opening of her LMCA with reperfusion, but LACA has a residual small distal anterior cerebral clot.  During procedure there was  ICA vasospasm vs dissection. She was loaded with ASA and Plavix. There was concern that she would need carotid  stenting . However upon re-imaging the vessel was open, and she did not require stent.  SAH, Parenchymal hemorrhage, and intraventricular hemorrhage.   She has had persistent, intermittent fevers since 8/24.   Also has had sinus tachycardia, from 110s to 130s, occasionally 140s 150s.  Tachy seems to correlate with the severity of fever mostly, with some outliers.    On zosyn and vanc from 8/25-8/27 Thought fevers possibly 2/2  covid vs stroke, abx stopped 8/27.  Good po intake, good UOP>   Current meds: nimodipine 60 q 4, protonix, thiamine, b12 Tylenol q 4 650mg   Blood and urine cultures 8/25 negative  Past Medical History   Past Medical History:  Diagnosis Date  . Anxiety   . Panic attack     Significant Hospital Events   05/19/2020 Admission to [facility] with acute L MCA syndrome  Consults:  8/21 PCCM, 8/27 reconsult, 8/30 reconsult   Procedures:  8/21: Mechanical Thrombectomy   Significant Diagnostic Tests:  05/19/2020 CTA Brain Cut off at the left ICA terminus with visible luminal clot. There is continuation of non opacification throughout the left M1 segment which is clot by CT. Non opacification at the left A1 origin. No contralateral branch occlusion is seen. Intravascular enhancement seen in the left operculum branches on the delayed phase.  Micro Data:  05/19/2020  SARS Coronavirus 2 NEGATIVE POSITIVEAbnormal   05/19/2020 >>> Blood Cultures x 2  8/25 blood cultures negative  Urine culture negative Antimicrobials:  None   Interim history/subjective:  Asked to reassess fever and tachycardia.  Fever persistent despite acetaminophen around the clock.  Nod yes and no to questions somewhat inconsistently.    Objective   Blood pressure 133/71, pulse (!) 130, temperature (!) 102.1 F (38.9 C), temperature source Oral, resp. rate 18, height 5\' 4"  (1.626 m), weight 84.2 kg, SpO2 95 %.        Intake/Output Summary (Last 24 hours)  at 05/28/2020 0321 Last data filed at 05/28/2020 0000 Gross per 24 hour  Intake 1477.7 ml  Output 1100 ml  Net 377.7 ml   Filed Weights   05/19/20 1100  Weight: 84.2 kg    Examination: General: awake alert, non interactive, nods yes occasionally Appears in NAD, warm to touch HENT: Sclera intact oral mucosa intact. 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  Subarachnoid Hemorrhage Covid 19 Positive Incidental finding on admission BMI 31.86 ( 62 inches, 84.2 KG) Persistent fevers with mild leukocytosis, cultures negative.  Tachcyardia (sinus?) to 110s-140s.   Mild NAGA   Plan:  DDX includes infection, dvt or thromboembolism, Covid -19, central related to stroke.  Medication seems less likely.  No respiratory distress apparent at this time Procal negative, WBC 15.7.   Seems reasonable to cont to hold antibiotics, perhaps restart after repeat cultures.   Check LFTs, lactate, CXR, ekg.  Given severity of fever, I think reasonable to reculture at this point.  LE doppler with more complete study seems prudent, as DVT is definitely on the differential.  It appears DVT ppx was held due to ICH.   Can probably be started now, at least ppx dosing.  Ice/cooling measures for fever as tolerates.   Appears stable to remain on tele bed currently.    Best practice:  Diet: Full diet Pain/Anxiety/Delirium protocol (if indicated): No  sedation  VAP protocol (if indicated): Not intubated DVT prophylaxis: None for now, received TPA plavix and ASA GI prophylaxis: Protonix Glucose control: CBG and SSI Mobility: BR Code Status: Full Family Communication:    Labs   CBC: Recent Labs  Lab 05/21/20 1220 05/21/20 1220 05/22/20 0356 05/23/20 0900 05/24/20 0524 05/25/20 0506 05/26/20 0539 05/27/20 0629 05/28/20 0151  WBC 10.9*   < > 12.2*   < > 11.0* 14.0* 13.9* 17.0* 15.7*  NEUTROABS 8.3*  --  8.9*  --   --   --   --   --   --   HGB 11.2*   < > 10.4*   < > 11.4* 10.6* 10.1* 10.3* 9.6*  HCT 35.4*   < > 33.5*   < > 35.6* 33.8* 31.7* 31.7* 28.7*  MCV 91.2   < > 91.8   < > 90.6 90.1 91.1 89.8 87.2  PLT 243   < > 209   < > 165 137* 129* 148*  190   < > = values in this interval not displayed.    Basic Metabolic Panel: Recent Labs  Lab 05/22/20 0356 05/23/20 0900 05/24/20 0524 05/25/20 0506 05/26/20 0539 05/27/20 0629 05/28/20 0151  NA 150*   < > 139 138 138 133* 129*  K 3.2*   < > 3.1* 3.5 3.8 3.8 4.1  CL 116*   < > 107 109 110 103 99  CO2 22   < > 21* 19* 19* 18* 18*  GLUCOSE 127*   < > 133* 120* 87 123* 115*  BUN 6   < > 9 8 10 10 9   CREATININE 0.54   < > 0.62 0.65 0.66 0.64 0.56  CALCIUM 8.8*   < > 8.4* 8.1* 7.9* 8.3* 8.2*  MG 1.8  --   --   --   --   --  2.0  PHOS 3.2  --   --   --   --   --   --    < > = values in this interval not displayed.   GFR: Estimated Creatinine Clearance: 115.8 mL/min (by C-G formula based on SCr of 0.56 mg/dL). Recent Labs  Lab 05/25/20 0506 05/26/20 0539 05/26/20 1300 05/27/20 0629 05/28/20 0151  PROCALCITON  --   --  <0.10  --   --   WBC 14.0* 13.9*  --  17.0* 15.7*    Liver Function Tests: Recent Labs  Lab 05/22/20 0356  AST 17  ALT 13  ALKPHOS 48  BILITOT 0.3  PROT 7.3  ALBUMIN 2.7*   No results for input(s): LIPASE, AMYLASE in the last 168 hours. No results for input(s): AMMONIA in the last 168 hours.  ABG    Component Value Date/Time   PHART 7.401 05/19/2020 1732   PCO2ART 33.8 05/19/2020 1732   PO2ART 394 (H) 05/19/2020 1732   HCO3 21.3 05/19/2020 1732   TCO2 22 05/19/2020 1732   ACIDBASEDEF 3.0 (H) 05/19/2020 1732   O2SAT 100.0 05/19/2020 1732     Coagulation Profile: No results for input(s): INR, PROTIME in the last 168 hours.  Cardiac Enzymes: No results for input(s): CKTOTAL, CKMB, CKMBINDEX, TROPONINI in the last 168 hours.  HbA1C: Hgb A1c MFr Bld  Date/Time Value Ref Range Status  05/20/2020 09:58 AM 5.6 4.8 - 5.6 % Final    Comment:    (NOTE) Pre diabetes:          5.7%-6.4%  Diabetes:              >6.4%  Glycemic control for   <7.0% adults with diabetes     CBG: No results for input(s): GLUCAP in the last 168  hours.  Review of Systems:   Unable as patient   Past Medical History  She,  has a past medical history of Anxiety and Panic attack.   Surgical History    Past Surgical History:  Procedure Laterality Date  . IR CT HEAD LTD  05/19/2020  . IR CT HEAD LTD  05/19/2020  . IR PERCUTANEOUS ART THROMBECTOMY/INFUSION INTRACRANIAL INC DIAG ANGIO  05/19/2020  . IR US GUIDE VASC ACCESS RIGHT  05/19/2020  . RADIOLOGY WITH ANESTHESIA N/A 05/19/2020   Procedure: IR WITH ANESTHESIA;  Surgeon: Radiologist, Medication, MD;  Location: [facility] OR;  Service: Radiology;  Laterality: N/A;     Social History   reports that she has been smoking cigarettes. She has never used smokeless tobacco. She reports current alcohol use. She reports that she does not use drugs.   Family History   Her family history is not on file.   Allergies Allergies  Allergen Reactions  . 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     Home Medications  Prior to Admission medications   Not on File    Total critical care time 35 min.   Charlotte Sanes, MD PCCM

## 2020-05-28 NOTE — Progress Notes (Signed)
  Speech Language Pathology Treatment: Cognitive-Linquistic  Patient Details Name: Moksha Dorgan MRN: [id number] DOB: [birth year] Today's Date: 05/28/2020 Time: 3810-1751 SLP Time Calculation (min) (ACUTE ONLY): 15 min  Assessment / Plan / Recommendation Clinical Impression  Patient seen to address aphasia goals. Patient lying on cold pack pad due to continued fevers and Rapid response RN in room and stating that she has an active PE. Patient very distracted and required maximal cues to attend to SLP. Left mit taken off but patient unable to perform reliable gestures, pointing or thumbs up despite hand over hand cues. Patient communicates with facial movements involving eyes, however she was not able to imitate SLP to perform more exaggerated head nods and head shakes. Her yes/no responses are inconsistent as they can be subtle, or at times when she is responding yes/no, she makes more of a confused expression. Patient did indicate by more perceptible head nod when asked if she wanted lemonade.     HPI HPI: Pt is a 24 yo female presenting with sudden onset R sided weakness and aphasia. 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 She was intubated for thrombectomy 8/21. 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 MRI showed early subacute infarct of the left basal ganglia involving the posterior putamen and caudate body. Pt self-extubated 8/22. Pt tested (+) for COVID-19 but was asymptomatic upon arrival. PMH: panic attack, anxiety      SLP Plan  Continue with current plan of care       Recommendations                   Follow up Recommendations: Inpatient Rehab SLP Visit Diagnosis: Aphasia (R47.01) Plan: Continue with current plan of care       GO               Angela Nevin, MA, CCC-SLP Speech Therapy [facility] Acute Rehab Pager: [phone]

## 2020-05-28 NOTE — Progress Notes (Signed)
PROGRESS NOTE    Gail Montgomery  WCB:[id number] DOB: 1996/01/06 DOA: 05/19/2020 PCP: No primary care provider on file.   No chief complaint on file.   Brief Narrative:  24 yo F without significant hx on OCP and with recreational MJ use who was normal upon waking on 8/21, bt at 0930 she had sudden R hemiparesis and aphasia.  She came to ED on 8/21 as Keenon Leitzel code stroke with L MCA syndrome.  Received tpa at 11:29 and was deemed Saranne Crislip candidate for thrombectomy and was taken to IR.  She subsequently developed SAH.  PCCM was c/s to manage vent, but she self extubated on 8/22.  Hospitalization was c/b fever and tachycardia.  She was treated with abx, but cultures were negative.  Fevers thought 2/2 COVID vs central fever.    Assessment & Plan:   Active Problems:   Stroke (cerebrum) (HCC)  Systemic Inflammatory Response Syndrome: fever, intermittent tachycardia, tachypnea since ~8/25.  She was on broad spectrum abx for about 3 days, with negative cultures.   She continues to have tachycardia with fever and intermittent tachypnea - thought has been central fever vs infectious -> She has PE which is likely contributing.  Blood cx from 8/25 NGTD, urine cx no growth Repeat blood cx 8/30 pending Inflammatory markers rising -> CRP up.  Procal indeterminate. Will also restart antibiotics due to severity of fevers (seems higher that what I may expect with fever from VTE).  Acute Pulmonary Embolism  Elevated D dimer: this is likely contributing to tachycardia and her fever.  Provoked in setting of her COVID 19 infection, she was also on OCP. Satting normally on RA.   Discussed with Dr. Pearlean Brownie in setting of her intracranial hemorrhage.  Will start heparin with no bolus.  Will transfer to ICU for closer monitoring in setting of her ICH.  Will consult critical care. Follow LE [place] Follow echo  Heparin gtt, no bolus   COVID 19 infection: diagnosed 8/21, doesn't appear she's ever had any evidence of pneumonia.  Fully  vaccinated.  Continue isolation at this time.  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 s/p tPA and IR with L MCA TICI3 procedure complicated by [facility] and IVH s/p tPA reversal - embolic pattern, unclear etiology -> ddx includes ICA dissection, parodxical emboli in setting of PFO, covid hypercoagulability in setting of OCP use and intermittent smoking Head CT from 8/26 with unchanged L ACA and L MCA branch infarcts Echo with EF 60-65%, no regional wall motion abnormalities (see report) Transcranial doppler with HITS at rest, suggestive of possible spencer grade 3 PFO Consider TEE after covid isolation LDL 78, hba1c 5.6 Hypercoagulable, autoimmune w/u per neurology Per neurology  Mild Hydrocephalus:  Per neurology  Hypertension: nimodipine for [facility], per neurology, keep on while inpatient, can d/c on d/c if neuro stable - SBP goal < 160 given SAH and IVH  Elevated LFTs: possibly 2/2 COVID? Continue to monitor  DVT prophylaxis: heparin  Code Status: full  Family Communication: none at bedside, Dr. Pearlean Brownie talked to family Disposition:   Status is: Inpatient  Remains inpatient appropriate because:Inpatient level of care appropriate due to severity of illness   Dispo: The patient is from: Home              Anticipated d/c is to: pending              Anticipated d/c date is: > 3 days  Patient currently is not medically stable to d/c.  Consultants:   PCCM  Neurology  Neuro IR  Procedures:  IMPRESSION: Status post ultrasound guided access right common femoral artery for cervical/cerebral angiogram and mechanical thrombectomy of left MCA restoring complete flow to the left MCA territory, and failed attempt at revascularization of left pericallosal thromboembolism.  Flat panel CT at the completion demonstrates thin subarachnoid hemorrhage in the anterior inter hemispheric fissure.  Angio-Seal deployed for  hemostasis.  Signed,  Yvone Neu. Reyne Dumas, RPVI  [facility]  [facility]    Echo IMPRESSIONS    1. Left ventricular ejection fraction, by estimation, is 60 to 65%. The  left ventricle has normal function. The left ventricle has no regional  wall motion abnormalities. Left ventricular diastolic parameters were  normal.  2. Right ventricular systolic function is normal. The right ventricular  size is normal.  3. The mitral valve is normal in structure. Trivial mitral valve  regurgitation. No evidence of mitral stenosis.  4. The aortic valve is normal in structure. Aortic valve regurgitation is  not visualized. No aortic stenosis is present.  5. The inferior vena cava is normal in size with greater than 50%  respiratory variability, suggesting right atrial pressure of 3 mmHg.  Transcranial doppler with bubbles Summary:     Anum Palecek vascular evaluation was performed. The left middle cerebral artery was  studied. An IV was inserted into the patient's left PICC line. Verbal  informed consent was obtained.    HITS (high intensity transient signals) were heard at rest, suggestive of  Berlie Hatchel possible Spencer Grade 3 PFO.   Positive TCdDBubble study indicative of Izabela Ow moderate size right to left  shunt  *See table(s) above for TCD measurements and observations.   LE [place] Summary:  RIGHT:  - There is no evidence of deep vein thrombosis in the lower extremity.  However, portions of this examination were limited- see technologist  comments above.    - No cystic structure found in the popliteal fossa.    LEFT:  - There is no evidence of deep vein thrombosis in the lower extremity.  However, portions of this examination were limited- see technologist  comments above.    - No cystic structure found in the popliteal fossa.    Antimicrobials:  Anti-infectives (From admission, onward)   Start     Dose/Rate Route Frequency  Ordered Stop   05/29/20 0000  vancomycin (VANCOCIN) IVPB 1000 mg/200 mL premix  Status:  Discontinued        1,000 mg 200 mL/hr over 60 Minutes Intravenous Every 8 hours 05/28/20 1543 05/28/20 1557   05/28/20 1545  ceFEPIme (MAXIPIME) 2 g in sodium chloride 0.9 % 100 mL IVPB  Status:  Discontinued        2 g 200 mL/hr over 30 Minutes Intravenous Every 8 hours 05/28/20 1541 05/28/20 1557   05/28/20 1545  vancomycin (VANCOREADY) IVPB 1500 mg/300 mL  Status:  Discontinued        1,500 mg 150 mL/hr over 120 Minutes Intravenous  Once 05/28/20 1541 05/28/20 1606   05/23/20 2200  piperacillin-tazobactam (ZOSYN) IVPB 3.375 g  Status:  Discontinued        3.375 g 12.5 mL/hr over 240 Minutes Intravenous Every 8 hours 05/23/20 2055 05/25/20 1619   05/23/20 2100  vancomycin (VANCOCIN) IVPB 1000 mg/200 mL premix  Status:  Discontinued        1,000 mg 200 mL/hr over 60 Minutes Intravenous Every 8  hours 05/23/20 2055 05/25/20 1608     Subjective: Nonverbal, shakes head yes and no to some questions  Objective: Vitals:   05/28/20 0152 05/28/20 0337 05/28/20 0610 05/28/20 0710  BP: 133/71 108/88 (!) 71/50 127/90  Pulse:      Resp: 18 20 (!) 26 20  Temp: (!) 102.1 F (38.9 C) (!) 103.1 F (39.5 C)  (!) 100.7 F (38.2 C)  TempSrc: Oral Oral  Axillary  SpO2: 95% 99% 96% 97%  Weight:      Height:        Intake/Output Summary (Last 24 hours) at 05/28/2020 1214 Last data filed at 05/28/2020 0000 Gross per 24 hour  Intake 0 ml  Output 850 ml  Net -850 ml   Filed Weights   05/19/20 1100  Weight: 84.2 kg    Examination:  General exam: Appears calm and comfortable  Respiratory system: Clear to auscultation. Respiratory effort normal. Cardiovascular system: tachycardia, regular rate Gastrointestinal system: Abdomen is nondistended, soft and nontender Central nervous system: Alert , but disoriented, doesn't say anything, but shakes head yes and no - follows commands inconsistently, RLE  weakness Extremities: no LEE  Data Reviewed: I have personally reviewed following labs and imaging studies  CBC: Recent Labs  Lab 05/21/20 1220 05/21/20 1220 05/22/20 0356 05/23/20 0900 05/24/20 0524 05/25/20 0506 05/26/20 0539 05/27/20 0629 05/28/20 0151  WBC 10.9*   < > 12.2*   < > 11.0* 14.0* 13.9* 17.0* 15.7*  NEUTROABS 8.3*  --  8.9*  --   --   --   --   --   --   HGB 11.2*   < > 10.4*   < > 11.4* 10.6* 10.1* 10.3* 9.6*  HCT 35.4*   < > 33.5*   < > 35.6* 33.8* 31.7* 31.7* 28.7*  MCV 91.2   < > 91.8   < > 90.6 90.1 91.1 89.8 87.2  PLT 243   < > 209   < > 165 137* 129* 148* 190   < > = values in this interval not displayed.    Basic Metabolic Panel: Recent Labs  Lab 05/22/20 0356 05/23/20 0900 05/25/20 0506 05/26/20 0539 05/27/20 0629 05/28/20 0151 05/28/20 0859  NA 150*   < > 138 138 133* 129* 131*  K 3.2*   < > 3.5 3.8 3.8 4.1 3.9  CL 116*   < > 109 110 103 99 99  CO2 22   < > 19* 19* 18* 18* 19*  GLUCOSE 127*   < > 120* 87 123* 115* 115*  BUN 6   < > 8 10 10 9 8   CREATININE 0.54   < > 0.65 0.66 0.64 0.56 0.60  CALCIUM 8.8*   < > 8.1* 7.9* 8.3* 8.2* 8.5*  MG 1.8  --   --   --   --  2.0  --   PHOS 3.2  --   --   --   --   --   --    < > = values in this interval not displayed.    GFR: Estimated Creatinine Clearance: 115.8 mL/min (by C-G formula based on SCr of 0.6 mg/dL).  Liver Function Tests: Recent Labs  Lab 05/22/20 0356 05/28/20 0859  AST 17 71*  ALT 13 56*  ALKPHOS 48 44  BILITOT 0.3 0.3  PROT 7.3 8.2*  ALBUMIN 2.7* 2.4*    CBG: No results for input(s): GLUCAP in the last 168 hours.   Recent Results (from the  past 240 hour(s))  SARS Coronavirus 2 by RT PCR (hospital order, performed in [facility] hospital lab) Nasopharyngeal Nasopharyngeal Swab     Status: Abnormal   Collection Time: 05/19/20 11:55 AM   Specimen: Nasopharyngeal Swab  Result Value Ref Range Status   SARS Coronavirus 2 POSITIVE (Vonzella Althaus) NEGATIVE Final    Comment: RESULT  CALLED TO, READ BACK BY AND VERIFIED WITH: RN MARK B. 1318 [id number] FCP (NOTE) SARS-CoV-2 target nucleic acids are DETECTED  SARS-CoV-2 RNA is generally detectable in upper respiratory specimens  during the acute phase of infection.  Positive results are indicative  of the presence of the identified virus, but do not rule out bacterial infection or co-infection with other pathogens not detected by the test.  Clinical correlation with patient history and  other diagnostic information is necessary to determine patient infection status.  The expected result is negative.  Fact Sheet for Patients:   BoilerBrush.com.cy   Fact Sheet for Healthcare Providers:   https://pope.com/    This test is not yet approved or cleared by the [place] FDA and  has been authorized for detection and/or diagnosis of SARS-CoV-2 by FDA under an Emergency Use Authorization (EUA).  This EUA will remain in effect (meaning this test can b e used) for the duration of  the COVID-19 declaration under Section 564(b)(1) of the Act, 21 U.S.C. section 360-bbb-3(b)(1), unless the authorization is terminated or revoked sooner.  Performed at [facility], [address] [place], [place] [address]   Blood Culture (routine x 2)     Status: None   Collection Time: 05/19/20  7:17 PM   Specimen: BLOOD LEFT HAND  Result Value Ref Range Status   Specimen Description BLOOD LEFT HAND  Final   Special Requests   Final    BOTTLES DRAWN AEROBIC AND ANAEROBIC Blood Culture adequate volume   Culture   Final    NO GROWTH 5 DAYS Performed at [facility], [address], [place], [place] [address]    Report Status 05/24/2020 FINAL  Final  Blood Culture (routine x 2)     Status: None   Collection Time: 05/19/20  7:19 PM   Specimen: BLOOD  Result Value Ref Range Status   Specimen Description BLOOD SITE NOT SPECIFIED  Final   Special Requests   Final    BOTTLES  DRAWN AEROBIC AND ANAEROBIC Blood Culture results may not be optimal due to an inadequate volume of blood received in culture bottles   Culture   Final    NO GROWTH 5 DAYS Performed at [facility], [address], [place], [place] [address]    Report Status 05/24/2020 FINAL  Final  MRSA PCR Screening     Status: None   Collection Time: 05/20/20  7:15 AM   Specimen: Nasal Mucosa; Nasopharyngeal  Result Value Ref Range Status   MRSA by PCR NEGATIVE NEGATIVE Final    Comment:        The GeneXpert MRSA Assay (FDA approved for NASAL specimens only), is one component of Lab[facility] [place] comprehensive MRSA colonization surveillance program. It is not intended to diagnose MRSA infection nor to guide or monitor treatment for MRSA infections. Performed at [facility], [address] [place], [place] [address]   Culture, blood (routine x 2)     Status: None   Collection Time: 05/23/20  3:44 PM   Specimen: BLOOD  Result Value Ref Range Status   Specimen Description BLOOD SITE  NOT SPECIFIED  Final   Special Requests   Final    BOTTLES DRAWN AEROBIC ONLY Blood Culture adequate volume   Culture   Final    NO GROWTH 5 DAYS Performed at [facility], [address] [place], [place] [address]    Report Status 05/28/2020 FINAL  Final  Culture, blood (routine x 2)     Status: None   Collection Time: 05/23/20  7:57 PM   Specimen: BLOOD  Result Value Ref Range Status   Specimen Description BLOOD RIGHT ANTECUBITAL  Final   Special Requests   Final    BOTTLES DRAWN AEROBIC ONLY Blood Culture adequate volume   Culture   Final    NO GROWTH 5 DAYS Performed at [facility], [address], [place], [place] [address]    Report Status 05/28/2020 FINAL  Final  Culture, Urine     Status: None   Collection Time: 05/23/20  9:03 PM   Specimen: Urine, Random  Result Value Ref Range Status   Specimen Description URINE, RANDOM  Final   Special Requests NONE  Final   Culture   Final     NO GROWTH Performed at [facility], [address], [place], [place] [address]    Report Status 05/24/2020 FINAL  Final         Radiology Studies: DG CHEST PORT 1 VIEW  Result Date: 05/28/2020 CLINICAL DATA:  Fever.  COVID positive. EXAM: PORTABLE CHEST 1 VIEW COMPARISON:  05/22/2020 FINDINGS: Midline trachea.  Normal heart size and mediastinal contours. Sharp costophrenic angles. No pneumothorax. Clear lungs. Mild right hemidiaphragm elevation. IMPRESSION: No active disease. Electronically Signed   By: Jeronimo Greaves M.D.   On: 05/28/2020 08:14        Scheduled Meds: .  stroke: mapping our early stages of recovery book   Does not apply Once  . Chlorhexidine Gluconate Cloth  6 each Topical Daily  . folic acid  1 mg Oral Daily  . mouth rinse  15 mL Mouth Rinse BID  . multivitamin  1 tablet Oral Daily  . niMODipine  60 mg Oral Q4H  . pantoprazole  40 mg Oral Daily  . sodium chloride flush  10-40 mL Intracatheter Q12H  . thiamine  100 mg Oral Daily  . vitamin B-12  500 mcg Oral Daily   Continuous Infusions: . sodium chloride Stopped (05/27/20 0859)  . sodium chloride Stopped (05/26/20 0736)  . lactated ringers       LOS: 9 days    Time spent: over 30  40 min critical care time with acute PE, SIRS criteria     Lacretia Nicks, MD [facility]   To contact the attending provider between 7A-7P or the covering provider during after hours 7P-7A, please log into the web site [email or web address] and access using universal  password for that web site. If you do not have the password, please call the hospital operator.  05/28/2020, 12:14 PM

## 2020-05-29 ENCOUNTER — Inpatient Hospital Stay (HOSPITAL_COMMUNITY): Payer: BC Managed Care – PPO

## 2020-05-29 DIAGNOSIS — I6389 Other cerebral infarction: Secondary | ICD-10-CM

## 2020-05-29 LAB — COMPREHENSIVE METABOLIC PANEL
ALT: 57 U/L — ABNORMAL HIGH (ref 0–44)
AST: 66 U/L — ABNORMAL HIGH (ref 15–41)
Albumin: 2 g/dL — ABNORMAL LOW (ref 3.5–5.0)
Alkaline Phosphatase: 43 U/L (ref 38–126)
Anion gap: 11 (ref 5–15)
BUN: 9 mg/dL (ref 6–20)
CO2: 21 mmol/L — ABNORMAL LOW (ref 22–32)
Calcium: 8.4 mg/dL — ABNORMAL LOW (ref 8.9–10.3)
Chloride: 98 mmol/L (ref 98–111)
Creatinine, Ser: 0.57 mg/dL (ref 0.44–1.00)
GFR calc Af Amer: >60 mL/min (ref >60)
GFR calc non Af Amer: >60 mL/min (ref >60)
Glucose, Bld: 104 mg/dL — ABNORMAL HIGH (ref 70–99)
Potassium: 3.8 mmol/L (ref 3.5–5.1)
Sodium: 130 mmol/L — ABNORMAL LOW (ref 135–145)
Total Bilirubin: 0.2 mg/dL — ABNORMAL LOW (ref 0.3–1.2)
Total Protein: 7.2 g/dL (ref 6.5–8.1)

## 2020-05-29 LAB — PROTIME-INR
INR: 1.1 (ref 0.8–1.2)
Prothrombin Time: 13.7 seconds (ref 11.4–15.2)

## 2020-05-29 LAB — HEPARIN LEVEL (UNFRACTIONATED)
Heparin Unfractionated: <0.1 [IU]/mL — ABNORMAL LOW (ref 0.30–0.70)
Heparin Unfractionated: 0.17 IU/mL — ABNORMAL LOW (ref 0.30–0.70)
Heparin Unfractionated: 0.25 IU/mL — ABNORMAL LOW (ref 0.30–0.70)

## 2020-05-29 LAB — CBC
HCT: 28.4 % — ABNORMAL LOW (ref 36.0–46.0)
Hemoglobin: 9.4 g/dL — ABNORMAL LOW (ref 12.0–15.0)
MCH: 28.7 pg (ref 26.0–34.0)
MCHC: 33.1 g/dL (ref 30.0–36.0)
MCV: 86.6 fL (ref 80.0–100.0)
Platelets: 242 10*3/uL (ref 150–400)
RBC: 3.28 MIL/uL — ABNORMAL LOW (ref 3.87–5.11)
RDW: 11.9 % (ref 11.5–15.5)
WBC: 15 10*3/uL — ABNORMAL HIGH (ref 4.0–10.5)
nRBC: 0 % (ref 0.0–0.2)

## 2020-05-29 LAB — ECHOCARDIOGRAM LIMITED
Height: 64 in
Weight: 3001.78 oz

## 2020-05-29 LAB — MAGNESIUM: Magnesium: 2.1 mg/dL (ref 1.7–2.4)

## 2020-05-29 LAB — PHOSPHORUS: Phosphorus: 3.8 mg/dL (ref 2.5–4.6)

## 2020-05-29 LAB — PROCALCITONIN: Procalcitonin: 0.32 ng/mL

## 2020-05-29 MED ORDER — NIMODIPINE 6 MG/ML PO SOLN
60.0000 mg | ORAL | Status: DC
  Filled 2020-05-29 (×4): qty 10

## 2020-05-29 MED ORDER — NIMODIPINE 30 MG PO CAPS
60.0000 mg | ORAL_CAPSULE | ORAL | Status: DC
  Filled 2020-05-29 (×5): qty 2

## 2020-05-29 MED ORDER — NIMODIPINE 6 MG/ML PO SOLN
60.0000 mg | ORAL | Status: DC
  Administered 2020-05-29 – 2020-06-07 (×51): 60 mg via ORAL
  Filled 2020-05-29 (×63): qty 10

## 2020-05-29 NOTE — Progress Notes (Signed)
  Echocardiogram 2D Echocardiogram has been performed.  Gail Montgomery 05/29/2020, 8:49 AM

## 2020-05-29 NOTE — Progress Notes (Signed)
ANTICOAGULATION CONSULT NOTE   Pharmacy Consult for Heparin Indication: 8/30 BL segmental PE   Allergies  Allergen Reactions  . 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    Patient Measurements: Height: 5\' 4"  (162.6 cm) Weight: 84.2 kg (185 lb 10 oz) IBW/kg (Calculated) : 54.7 Heparin Dosing Weight: 73kg  Vital Signs: Temp: 100.5 F (38.1 C) (08/31 0200) Temp Source: Rectal (08/31 0200) BP: 120/73 (08/31 0200) Pulse Rate: 105 (08/31 0200)  Labs: Recent Labs    05/27/20 0629 05/27/20 0629 05/28/20 0151 05/28/20 0859 05/29/20 0055  HGB 10.3*   < > 9.6*  --  9.4*  HCT 31.7*  --  28.7*  --  28.4*  PLT 148*  --  190  --  242  HEPARINUNFRC  --   --   --   --  <0.10*  CREATININE 0.64   < > 0.56 0.60 0.57   < > = values in this interval not displayed.    Estimated Creatinine Clearance: 115.8 mL/min (by C-G formula based on SCr of 0.57 mg/dL).  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 Patient with persistent intracranial hemorrhage per 8/26 CT head. Pharmacy consulted to start heparin no bolus.   Will target low end of goal as much as possible.   8/31 AM update:  Heparin level undetectable No issues per RN   Goal of Therapy:  Heparin level 0.3- 0.5 units/ml Monitor platelets by anticoagulation protocol: Yes   Plan:  No boluses  Inc heparin drip to 1050 units/hr Re-check heparin level in 6-8 hours Monitor for signs/symptoms of bleeding    9/31, PharmD, BCPS Clinical Pharmacist Phone: [phone]

## 2020-05-29 NOTE — Progress Notes (Signed)
Assisted tele visit to patient with boyfriend.  Fahmida Jurich M Devita Nies, RN   

## 2020-05-29 NOTE — Progress Notes (Addendum)
STROKE TEAM PROGRESS NOTE   INTERVAL HISTORY Patient is still nonverbal but interacting with facial expressions, and following midline and one-step commands quite well consistently.  No events overnight, she has been started on IV heparin after careful discussion of risk benefit with patient's parents since her lower extremity venous Dopplers showed DVT as well as CT angio chest showed pulmonary emboli. She remains tachycardic and febrile with T-max 100.5.  But oxygen sats seem adequately maintained even on room air.  No obvious source of infection noted so far. CT scan of the head done today shows decrease in size and consider CT of the intraparenchymal as well as extra-axial and intraventricular hemorrhage.  Subacute left ACA and MCA infarcts showing expected evolutionary changes. OBJECTIVE Vitals:   05/29/20 0200 05/29/20 0300 05/29/20 0339 05/29/20 0400  BP: 120/73 113/87  120/70  Pulse: (!) 105 (!) 124  (!) 107  Resp: 20 (!) 25  (!) 21  Temp: (!) 100.5 F (38.1 C)  (!) 100.7 F (38.2 C)   TempSrc: Rectal  Rectal   SpO2: 99% 98%  98%  Weight:      Height:       CBC:  Recent Labs  Lab 05/28/20 0151 05/29/20 0055  WBC 15.7* 15.0*  HGB 9.6* 9.4*  HCT 28.7* 28.4*  MCV 87.2 86.6  PLT 190 242   Basic Metabolic Panel:  Recent Labs  Lab 05/28/20 0151 05/28/20 0151 05/28/20 0859 05/29/20 0055  NA 129*   < > 131* 130*  K 4.1   < > 3.9 3.8  CL 99   < > 99 98  CO2 18*   < > 19* 21*  GLUCOSE 115*   < > 115* 104*  BUN 9   < > 8 9  CREATININE 0.56   < > 0.60 0.57  CALCIUM 8.2*   < > 8.5* 8.4*  MG 2.0  --   --  2.1  PHOS  --   --   --  3.8   < > = values in this interval not displayed.   Lipid Panel:     Component Value Date/Time   CHOL 156 05/20/2020 0958   TRIG 105 05/20/2020 0958   TRIG 104 05/20/2020 0958   HDL 57 05/20/2020 0958   CHOLHDL 2.7 05/20/2020 0958   VLDL 21 05/20/2020 0958   LDLCALC 78 05/20/2020 0958   HgbA1c:  Lab Results  Component Value Date    HGBA1C 5.6 05/20/2020   Urine Drug Screen:     Component Value Date/Time   LABOPIA NONE DETECTED 05/20/2020 1736   COCAINSCRNUR NONE DETECTED 05/20/2020 1736   LABBENZ NONE DETECTED 05/20/2020 1736   AMPHETMU NONE DETECTED 05/20/2020 1736   THCU POSITIVE (A) 05/20/2020 1736   LABBARB NONE DETECTED 05/20/2020 1736    Alcohol Level No results found for: [facility]  IMAGING  CT HEAD CODE STROKE WO CONTRAST 05/19/2020 Dense left ICA terminus and MCA. No acute hemorrhage. ASPECTS is 10.   CT Code Stroke CTA Head W/WO contrast CT Code Stroke CTA Neck W/WO contrast 05/19/2020 1. 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  2. Mildly indistinct appearance of the proximal left ICA is likely from motion artifact, consider cervical run. No atheromatous changes or vasculopathy seen in the neck.   Neuro Interventional Radiology - Cerebral Angiogram with Intervention - Dr Loreta Ave 05/19/20 2:27 PM Left MCA: Baseline TICI 0 First Pass Device:  Local aspiration and solitaire 4 x 40              Result                         TICI 0 Second Pass Device:           Local aspiration and solitaire 4 x 40              Result                         TICI 3 Left ACA/pericallosal  Baseline TICI 0,    Final TICI 0 pericallosal artery Findings:  Patent right CFA Left M1 occlusion, final is TICI 3  Left ACA A2 occlusion migrated to pericallosal artery during the case.  Could not complete a first pass safely.  Significant irregularity of the cervical ICA, concerning for dissection, that resolved after observation, and once DAPT was initiated.  The final appearance may represent spasm vs FMD/vasculitis  CT HEAD WO CONTRAST 05/19/2020 New large volume of subarachnoid and intraventricular hemorrhage.  CT HEAD WO CONTRAST 05/20/2020 1. Subarachnoid hemorrhage along the anterior interhemispheric fissure with slightly increased extension into both lateral ventricles.   2. Slight increase in size of the temporal horn of the left lateral ventricle consistent with early communicating hydrocephalus.   MR BRAIN WO CONTRAST 05/20/2020 1. Early subacute infarct of the left basal ganglia involving the posterior putamen and caudate body.  2. Subarachnoid hemorrhage along the interhemispheric fissure extending into the lateral ventricles.  3. 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   CT HEAD WO CONTRAST 05/21/2020 1. Stable appearance of left basal ganglia nonhemorrhagic infarction. 2. Stable medial left frontal extra-axial hemorrhage, extending into the ventricles bilaterally. 3. Subarachnoid hemorrhage posteriorly on the left and along the inter cerebral hemisphere likely represents redistribution without significant new hemorrhage. 4. Hemorrhage is present within the aqueduct of Sylvius. 5. Progressive dilation of the ventricles compatible with developing hydrocephalus. 6. No new areas of hemorrhage.   CT HEAD WO CONTRAST 05/22/2020 1. Regressed intraventricular hemorrhage since yesterday with small volume residual. Stable mild ventriculomegaly, and possible mild transependymal edema. 2. Stable left inferior frontal gyrus parasagittal hematoma (approximately 12 mL) and regional edema. No significant midline shift, basilar cisterns are patent. 3. Stable left basal ganglia infarcts. 4. No new intracranial abnormality.   CT HEAD WO CONTRAST 05/24/20 IMPRESSION: 1. 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 2. Unchanged intracranial hemorrhage with mild lateral ventriculomegaly.  ECHOCARDIOGRAM COMPLETE 05/20/2020 1. Left ventricular ejection fraction, by estimation, is 60 to 65%. The left ventricle has normal function. The left ventricle has no regional wall motion abnormalities. Left ventricular diastolic parameters were normal.   2. Right ventricular systolic function is normal. The right ventricular size  is normal.   3. The mitral valve is normal in structure. Trivial mitral valve regurgitation. No evidence of mitral stenosis.   4. The aortic valve is normal in structure. Aortic valve regurgitation is not visualized. No aortic stenosis is present.   5. The inferior vena cava is normal in size with greater than 50% respiratory variability, suggesting right atrial pressure of 3 mmHg.   Bilateral Lower Extremity Venous Dopplers  05/21/2020 RIGHT: - There is no evidence of deep vein thrombosis in the lower extremity. However, portions of this examination were limited- see technologist comments above.  - No cystic structure  found in the popliteal fossa.   LEFT: - There is no evidence of deep vein thrombosis in the lower extremity. However, portions of this examination were limited- see technologist comments above.  - No cystic structure found in the popliteal fossa.    Transcranial Doppler w/ Bubble 05/25/2020 Not cooperative for Valsalva.  Bubble study showed Spencer degree 3 at rest.  Recommend TEE for further confirmation.  Consider TEE after Covid isolation and before CIR discharge CT head 05/29/2020 :Interval decrease in the size/conspicuity of intraparenchymal, extra-axial, and intraventricular hemorrhage, as above. No new acute hemorrhage. PHYSICAL EXAM  Temp:  [100.5 F (38.1 C)-103.4 F (39.7 C)] 100.7 F (38.2 C) (08/31 0339) Pulse Rate:  [99-136] 107 (08/31 0400) Resp:  [20-27] 21 (08/31 0400) BP: (71-145)/(50-99) 120/70 (08/31 0400) SpO2:  [95 %-100 %] 98 % (08/31 0400)  General -mildly obese young African-American lady Ophthalmologic - fundi not visualized due to noncooperation.   Cardiovascular - regular rhythm and rate  Neurological exam : Awake alert with flat affect, eyes open, nonverbal, did not repeat sentences with me today or answer any questions or say her name but can use expressions to answer some questions, she will raise eyebrows and shrug.  Able to follow simple  commands on both hands and left foot. Able to have bilateral gaze with voice on both sides, however right gaze incomplete, still has left gaze preference. PERRL. Blinking to visual threat bilaterally. Right facial droop. Tongue protrusion not cooperative. LUE spontaneous movement and LLE strong withdraw to pain. RUE proximal 4/5, distal finger grip 4/5. RLE plegic with only slight withdraw to pain. Sensation, coordination not cooperative and gait not tested.   ASSESSMENT/PLAN Ms. Chaya JanKeren Regan is a 24 y.o. female with history of tobacco use, anxiety (panic attacks) and hormonal birth control presented with right-sided weakness and aphasia that started abruptly at 9:30 AM.  COVID - positive. IV t-PA - Saturday 05/19/20 at 1145. Thrombectomy - Dr Loreta AveWagner - Left M1 occlusion, final is TICI 3.   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 Paradoxical emboli in the setting of PFO - DVT neg 3. COVID hypercoagulability in the setting of OCP use and intermittent smoking - however, COVID Ab high titer  CT Head - Dense left ICA terminus and MCA. No acute hemorrhage. ASPECTS is 10.      CTA H&N - Emergent large vessel occlusion at the left ICA terminus continuing into the MCA. Mildly indistinct appearance of the proximal left ICA is likely from motion artifact, consider cervical run. No atheromatous changes or vasculopathy seen in the neck.  IR - left MCA and A2 occlusion s/p TICI3 of MCA and TICI0 of ACA. Initially left ICA stenosis post IR but resolved on the final run  CT head 8/21 - New large volume of subarachnoid and intraventricular hemorrhage.   CT head 8/22 [facility]- SAH along the anterior interhemispheric fissure with slightly increased extension into both lateral ventricles. Slight increase in size of the temporal horn of  the left lateral ventricle consistent with early communicating hydrocephalus.   MRI head 8/22 -  Early subacute infarct of the left basal ganglia involving the posterior putamen and caudate body.  CT head 8/23 - stable L basal ganglia infarct, ICH/IVH/SAH. Progressive dilation of ventricles. CT head 8/24 -  regressed IVH. Stable infarct and frontal ICH. Stable ventriculomegaly CT head 8/26 - stable infarcts & hemorrhage w/ mild ventriculomegaly  LE Venous Dopplers - no DVT  2D Echo EF 60-65%  TCD bubble study Spencer degree 3 at rest.  Not cooperative for Valsalva.  Sars Corona Virus 2 - positive  LDL - 78  HgbA1c 5.6  Hypercoagulable and autoimmune labs ANCA proteinase 3 ab 6.0 (h) ANA positive - C - ANCA titers < 1:20 (neg)  ;  P - ANCA titers < 1:20 (neg)  UDS - THC positive  VTE prophylaxis - SCDs  No antithrombotic prior to admission, now on No antithrombotic due to [facility] and IVH  Ongoing aggressive stroke risk factor management  Therapy recommendations:  CIR   Disposition:  Pending   Hydrocephalus, mild  CT head showed mildly increased size of left temporal horn   CT repeat stable hemorrhage but mild progression of hydrocephalus  CT head 8/24 - regressed IVH. Stable infarct and frontal ICH. Stable mild ventriculomegaly  NSG on board  3% hypertonic saline - 50 cc/hr ->25cc->10cc -> off  Na - 139->149->154->148->152->150->140->139->138->138->133  PICC placed 8/23   Repeat CT on 8/26 stable  PFO  TCD bubble study showed Spencer degree 3 at rest.  Not cooperative with Valsalva  Consider TEE after Covid isolation and before CIR discharge  If PFO confirmed, consider to refer to Dr. Excell Seltzer cardiology for PFO closure.  BP management  Stable  On Nimodipine for [facility] - discussed with Dr. Conchita Paris, will keep nimodipine while inpt - d/c on discharge if neuro stable  SBP goal < 160 given SAH and IVH (SBP 99 - 125 this AM) . Long-term BP goal  normotensive  Hyperlipidemia  Home Lipid lowering medication: none   LDL 78, goal < 70  No statin at this time due to [facility] and IVH  Consider low dose statin at discharge  ? Hypercoagulable state   COVID positive - asymptomatic  D-dimer > 20->16.13->17.44-> >20-> >20   OCP user with smoking and THC use  Hypercoagulable work up (so far) - ANCS proteinase 3 - 6.0 (H) (0.0 - 3.5)   - C - ANCA titers < 1:20 (neg)  ;  P - ANCA titers < 1:20 (neg)  Myeloperoxidase Abs - < 9.0 (WNL)  COVID - 19 infection  COVID + test 8/21  Asymptomatic  Ferritin 24->30->37->51->93->92  Fibrinogen 289  CRP 0.8->2.4->3.3->3.9->8.7->9.1   D-dimer > 20->16.13->17.44-> >20-> >20 -> >20   COVID Ab titer > 2500  Per ID, ok to be off isolation in 10 days following + test as asymptomatic (05/29/2020)  Leukocytosis, Fever  Etiology unknown  TMax 101.2->103.1->100.1->102.3->99.1->103->102->101.4  WBC 10.9->12.2->12.5->11.0->14.0->13.9->17.0  UA small LE, rare bact, 0-5 WBC - neg  CXR neg  Urine culture - 8/25 - no growth  Blood Cx - 8/25 - NG x 3 days - final pending  On vanco and zosyn empiric treatment 8/25>>discontinued 8/27 - now off all abxs  CCM on board  Tachycardia  HR 121-153  Likely due to fever  Continue monitoring  Euthyroid sick syndrome  Low TSH 0.054   normal free T4 and T3  Likely related to critical illness  Tobacco abuse  Intermittent smoking per mom  Smoking cessation counseling will be provided  Other Stroke Risk Factors  ETOH use, advised to drink no more than 1 alcoholic beverage per day  Obesity, Body mass index is 31.86 kg/m., recommend weight loss, diet and exercise as appropriate   Hormonal birth control -  both progesterone and estrogen - Estarylla  Other Active Problems  Code status - Full code  Mild anemia 11.2->10.4->11.6->11.4->10.6->10.1->10.3  Hypokalemia 3.3->4.4->3.2 ->3.4 ->3.1->3.5 - supplement - 3.8->3.8  Low normal  B12 level 300 - supplement  Thrombocytopenia - 209->137->129->148  PICC line placed 8/23  Hospital day # 10   Patient remains tachycardic and febrile likely due to her acute DVT and pulmonary emboli but fortunately is hemodynamically stable and maintaining oxygen saturations quite well.  She remains at risk for increasing hemorrhage with full anticoagulation however due to her hypercoagulable state with Covid and recent embolic stroke as well as now pulmonary embolism and acute DVT she will benefit with anticoagulation with IV heparin drip.  I had a long discussion over the phone with the patient's mother and father regarding this delicate situation and the need to anticoagulate despite recent intracerebral hemorrhage which hopefully was a one-time event caused by the TPA and the mechanical thrombectomy procedure.  Recommend continue IV heparin stroke protocol with close monitoring by pharmacy to keep heparin level in the lower therapeutic range.  Hopefully transition to Eliquis when she gets transferred to rehab.  Discussed with Dr. Gaynell Face  critical care medicine. This patient is critically ill and at significant risk of neurological worsening, death and care requires constant monitoring of vital signs, hemodynamics,respiratory and cardiac monitoring, extensive review of multiple databases, frequent neurological assessment, discussion with family, other specialists and medical decision making of high complexity.I have made any additions or clarifications directly to the above note.This critical care time does not reflect procedure time, or teaching time or supervisory time of PA/NP/Med Resident etc but could involve care discussion time.  I spent 35 minutes of neurocritical care time  in the care of  this patient.   Delia Heady, MD     To contact Stroke Continuity provider, please refer to [email or web address]. After hours, contact General Neurology

## 2020-05-29 NOTE — Progress Notes (Signed)
ANTICOAGULATION CONSULT NOTE   Pharmacy Consult for Heparin Indication: 8/30 BL segmental PE   Allergies  Allergen Reactions  . 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    Patient Measurements: Height: 5\' 4"  (162.6 cm) Weight: 85.1 kg (187 lb 9.8 oz) IBW/kg (Calculated) : 54.7 Heparin Dosing Weight: 73kg  Vital Signs: Temp: 101.9 F (38.8 C) (08/31 1957) Temp Source: Rectal (08/31 1957) BP: 128/107 (08/31 2000) Pulse Rate: 135 (08/31 2000)  Labs: Recent Labs    05/27/20 0629 05/27/20 0629 05/28/20 0151 05/28/20 0859 05/29/20 0055 05/29/20 1102 05/29/20 1938  HGB 10.3*   < > 9.6*  --  9.4*  --   --   HCT 31.7*  --  28.7*  --  28.4*  --   --   PLT 148*  --  190  --  242  --   --   LABPROT  --   --   --   --  13.7  --   --   INR  --   --   --   --  1.1  --   --   HEPARINUNFRC  --   --   --   --  <0.10* 0.17* 0.25*  CREATININE 0.64   < > 0.56 0.60 0.57  --   --    < > = values in this interval not displayed.    Estimated Creatinine Clearance: 116.5 mL/min (by C-G formula based on SCr of 0.57 mg/dL).  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 Patient with persistent intracranial hemorrhage per 8/26 CT head. Pharmacy consulted to start heparin no bolus. Will need to target low end of goal as much as possible.   Heparin drip rate 1150 uts/hr HL 0.25 almost to low end of goal.  No issues with heparin gtt per Rn. Hgb has been trending down. PLT stable.   Goal of Therapy:  Heparin level 0.3- 0.5 units/ml Monitor platelets by anticoagulation protocol: Yes   Plan:  No boluses  Increase heparin drip to 1,250 units/hr Daily CBC, HL Monitor for signs/symptoms of bleeding   9/26 Pharm.D. CPP, BCPS Clinical Pharmacist [phone] 05/29/2020 8:40  PM

## 2020-05-29 NOTE — Progress Notes (Addendum)
NAME:  Gail Montgomery, MRN:  [id number], DOB:  June 26, 1996, LOS: 10 ADMISSION DATE:  05/19/2020, CONSULTATION DATE:  05/28/2020 REFERRING MD:  Aroor CHIEF COMPLAINT: Fever , tachycardia  Brief History   24 year old female without significant PMH on BCP, some recreational mariajuana use. She was normal upon waking the morning of  8/21.  At 09:30 she had sudden onset of right hemiparesis, and aphasia. She presented 8/21/2021to the [facility] ED as a Code Stroke  with acute left MCA syndrome. She received tPA at 11:29 am she was deemed a  candidate for thrombectomy, and was taken to IR.  Developed SAH subsequently.  PCCM was initially consulted for vent management, patient apparently self extubated on 8/22.   PCCM was not following, then apparently asked to reconsult for fever and tachycardia on 8/27, and there have been no notes since.    History of present illness   24 year old female normal upon waking up this morning 8/21 2021. At 09:30 she had sudden onset of right hemiparesis, and aphasia. She presents 8/21/2021to the [facility] ED as a Code Stroke  with acute left MCA syndrome.She was screened as Covid +, but was asymptomatic.  She received tPA at 11:29 am , she was deemed a  candidate for thrombectomy, and was taken to IR..In IR she had successful  opening of her LMCA with reperfusion, but LACA has a residual small distal anterior cerebral clot.  During procedure there was  ICA vasospasm vs dissection. She was loaded with ASA and Plavix. There was concern that she would need carotid  stenting . However upon re-imaging the vessel was open, and she did not require stent.  SAH, Parenchymal hemorrhage, and intraventricular hemorrhage.   She has had persistent, intermittent fevers since 8/24.   Also has had sinus tachycardia, from 110s to 130s, occasionally 140s 150s.  Tachy seems to correlate with the severity of fever mostly, with some outliers.    On zosyn and vanc from 8/25-8/27 Thought fevers possibly 2/2  covid vs stroke, abx stopped 8/27.  Good po intake, good UOP>   Current meds: nimodipine 60 q 4, protonix, thiamine, b12 Tylenol q 4 650mg   Blood and urine cultures 8/25 negative  Past Medical History   Past Medical History:  Diagnosis Date  . Anxiety   . Panic attack     Significant Hospital Events   05/19/2020 Admission to [facility] with acute L MCA syndrome  Consults:  8/21 PCCM, 8/27 reconsult, 8/30 reconsult   Procedures:  8/21: Mechanical Thrombectomy   Significant Diagnostic Tests:  05/19/2020 CTA Brain Cut off at the left ICA terminus with visible luminal clot. There is continuation of non opacification throughout the left M1 segment which is clot by CT. Non opacification at the left A1 origin. No contralateral branch occlusion is seen. Intravascular enhancement seen in the left operculum branches on the delayed phase.  8/30 CTA chest> bilateral segmental lower lobe pulmonary emboli L>R. Equivocal segmental emboli in L upper lobe. Mild cardiomegaly   8/30 LE doppler: RIGHT:  - Findings consistent with acute deep vein thrombosis involving the right  femoral vein, right popliteal vein, and right gastrocnemius veins.  - No cystic structure found in the popliteal fossa.    LEFT:  - There is no evidence of deep vein thrombosis in the lower extremity.    - No cystic structure found in the popliteal fossa.  8/31 cth: 1. Interval decrease in the size/conspicuity of intraparenchymal, extra-axial, and intraventricular hemorrhage, as above. No  new acute hemorrhage. 2. Redemonstrated subacute left ACA and left MCA territory infarcts. No progressive mass effect. 3. Slightly improved mild ventriculomegaly.     Micro Data:  05/19/2020  SARS Coronavirus 2 NEGATIVE POSITIVEAbnormal   05/19/2020 >>> Blood Cultures x 2  8/25 blood cultures negative  Urine culture negative Antimicrobials:  8/25 Vanc>8/28 8/25 zosyn> 8/28 Interim history/subjective:  Transferred back to  ICU for pe and LE dvt on heparin infusion in light of sah and parenchymal hemorrhage. Remains nonverbal but able to respond to questioning with nod/shaking head. tmax 103.4 overnight  Objective   Blood pressure 119/77, pulse (!) 118, temperature (!) 101.1 F (38.4 C), temperature source Rectal, resp. rate (!) 21, height 5\' 4"  (1.626 m), weight 85.1 kg, SpO2 90 %.        Intake/Output Summary (Last 24 hours) at 05/29/2020 0830 Last data filed at 05/29/2020 0600 Gross per 24 hour  Intake 109.11 ml  Output --  Net 109.11 ml   Filed Weights   05/19/20 1100 05/29/20 0400  Weight: 84.2 kg 85.1 kg    Examination:  General: sitting up in bed awake and holding phone to ear but not speaking. In  NAD HENT: sclerae intact Lungs: Symmetrical chest expansion, no accessory muscle use on RA  Cardiovascular: Tachycardic rate regular rhythm  Abdomen: soft.  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 -Bubble study with spencer grade 3 pfo  P Stroke team following Frequent neuro exams -- cont heparin for acute PE and extensive RLE dvt Repeat CT H per neuro: stable Nimodipine, consider po  Acute Pulmonary Embolism - covid 19, not on dvt ppx in setting of ICH  -Mild cardiomegaly on CTA chest  P Heparin per pharmacy Close neuro monitoring in setting of ICH  R LE dvt ECHO pending  COVID-19 positive -incidental finding, fully vaccinated with pfizer vaccine ~1 month ago and without evidence of PNA P Continue contact/airborne precautions Continuous SpO2 monitoring  SIRS: Tachycardia, Fevers, mild leukocytosis -ddx infection vs reactive (in setting of PE) or central fevers (in setting of intracranial  process) PCT with slight increase from < 0.1 to 0.25  P  Trend CBC WBC repeat BCx pending Ice/cooling measures for fever as tolerates with tmax to 103.4  Best practice:  Diet: Full diet Pain/Anxiety/Delirium protocol (if indicated): No  sedation  VAP protocol (if indicated): Not intubated DVT prophylaxis: full dose heparin GI prophylaxis: Protonix Glucose control: CBG and SSI Mobility: BR Code Status: Full Family Communication: pending 8/31 Dispo: ICU in setting of recent ICH and acute PE requiring anticoagulation   Labs   CBC: Recent Labs  Lab 05/25/20 0506 05/26/20 0539 05/27/20 0629 05/28/20 0151 05/29/20 0055  WBC 14.0* 13.9* 17.0* 15.7* 15.0*  HGB 10.6* 10.1* 10.3* 9.6* 9.4*  HCT 33.8* 31.7* 31.7* 28.7* 28.4*  MCV 90.1 91.1 89.8 87.2 86.6  PLT 137* 129* 148* 190 242    Basic Metabolic Panel: Recent Labs  Lab 05/26/20 0539 05/27/20 0629 05/28/20 0151 05/28/20 0859 05/29/20 0055  NA 138 133* 129* 131* 130*  K 3.8 3.8 4.1 3.9 3.8  CL 110 103 99 99 98  CO2 19* 18* 18* 19* 21*  GLUCOSE 87 123* 115* 115* 104*  BUN 10 10  9 8 9   CREATININE 0.66 0.64 0.56 0.60 0.57  CALCIUM 7.9* 8.3* 8.2* 8.5* 8.4*  MG  --   --  2.0  --  2.1  PHOS  --   --   --   --  3.8   GFR: Estimated Creatinine Clearance: 116.5 mL/min (by C-G formula based on SCr of 0.57 mg/dL). Recent Labs  Lab 05/26/20 0539 05/26/20 1300 05/27/20 0629 05/28/20 0151 05/28/20 0438 05/28/20 1225 05/29/20 0055  PROCALCITON  --  <0.10  --   --   --  0.25 0.32  WBC 13.9*  --  17.0* 15.7*  --   --  15.0*  LATICACIDVEN  --   --   --   --  0.8  --   --     Liver Function Tests: Recent Labs  Lab 05/28/20 0859 05/29/20 0055  AST 71* 66*  ALT 56* 57*  ALKPHOS 44 43  BILITOT 0.3 0.2*  PROT 8.2* 7.2  ALBUMIN 2.4* 2.0*   No results for input(s): LIPASE, AMYLASE in the last 168 hours. No results for input(s): AMMONIA in the last 168 hours.  ABG    Component Value Date/Time   PHART 7.401  05/19/2020 1732   PCO2ART 33.8 05/19/2020 1732   PO2ART 394 (H) 05/19/2020 1732   HCO3 21.3 05/19/2020 1732   TCO2 22 05/19/2020 1732   ACIDBASEDEF 3.0 (H) 05/19/2020 1732   O2SAT 100.0 05/19/2020 1732     Coagulation Profile: Recent Labs  Lab 05/29/20 0055  INR 1.1    Cardiac Enzymes: No results for input(s): CKTOTAL, CKMB, CKMBINDEX, TROPONINI in the last 168 hours.  HbA1C: Hgb A1c MFr Bld  Date/Time Value Ref Range Status  05/20/2020 09:58 AM 5.6 4.8 - 5.6 % Final    Comment:    (NOTE) Pre diabetes:          5.7%-6.4%  Diabetes:              >6.4%  Glycemic control for   <7.0% adults with diabetes     CBG: No results for input(s): GLUCAP in the last 168 hours.  Critical care time: The patient is critically ill with multiple organ systems failure and requires high complexity decision making for assessment and support, frequent evaluation and titration of therapies, application of advanced monitoring technologies and extensive interpretation of multiple databases.  Critical care time 34 mins. This represents my time independent of the NPs time taking care of the pt. This is excluding procedures.    05/22/2020 DO [facility] 05/29/2020, 8:30 AM

## 2020-05-29 NOTE — Progress Notes (Signed)
Physical Therapy Treatment Patient Details Name: Gail Montgomery MRN: [id number] DOB: 1996/01/16 Today's Date: 05/29/2020    History of Present Illness 24 yo who had acute R sided weakness and aphasia 05/19/20 due to LVO at LICA. Screened Covid + but asymptomatic; tPA and underwent thrombectomy; during procedure had ICA vasospasm vs dissection.  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 Pt self extubated 05/20/20.     PT Comments    On arrival pt alert, but when asking her name, she acted confused and didn't confirm pronounciation.  Pt definitely had difficulty maintaining focus, needed visual/tactile cues to assist following verbal commands.  Emphasis on transitions, sitting balance, sit to standing trials with stress on R knee extension and control of flexor moment, transfers.   Follow Up Recommendations  CIR     Equipment Recommendations  Wheelchair (measurements PT);Wheelchair cushion (measurements PT);Hospital bed;3in1 (PT)    Recommendations for Other Services Rehab consult     Precautions / Restrictions Precautions Precautions: Fall Precaution Comments: right sided weakness (R leg most significantly)    Mobility  Bed Mobility Overal bed mobility: Needs Assistance Bed Mobility: Supine to Sit     Supine to sit: +2 for safety/equipment;Mod assist     General bed mobility comments: pt also needed maximal assist for unilateral /assymetrica scooting.  Transfers Overall transfer level: Needs assistance   Transfers: Sit to/from Stand;Stand Pivot Transfers Sit to Stand: Mod assist;Max assist (or mod +2 with right knee blocking) Stand pivot transfers: Max assist;+2 physical assistance       General transfer comment: cues/hand over hand for hand placement, assist to come forward and boost.  Blocking of R LE and assist with movement of right leg  for pivot.  Ambulation/Gait             General Gait Details: not able at this time   Stairs             Wheelchair Mobility    Modified Rankin (Stroke Patients Only) Modified Rankin (Stroke Patients Only) Pre-Morbid Rankin Score: No symptoms Modified Rankin: Severe disability     Balance Overall balance assessment: Needs assistance   Sitting balance-Leahy Scale: Fair Sitting balance - Comments: min guard, but not challenged   Standing balance support: Bilateral upper extremity supported Standing balance-Leahy Scale: Poor Standing balance comment: stood x4 with mod to max.  R knee blocked and controlled against flexor withdrawal                            Cognition Arousal/Alertness: Awake/alert Behavior During Therapy: Flat affect Overall Cognitive Status: Difficult to assess Area of Impairment: Following commands                   Current Attention Level: Focused;Sustained   Following Commands: Follows one step commands inconsistently;Follows one step commands with increased time     Problem Solving: Slow processing;Requires tactile cues General Comments: didn't acknowledge question Do you want your glasses until the glasses were shown to her.      Exercises Other Exercises Other Exercises: RLE being held in flexor synergy-stretched in opposite directions (hip adduction, internal rotation, extension; knee extension; ankle dorsiflexion and pronation). Noted rt heel cord tightening and able to achieve ~ 10 degrees DF with prolonged stretches.    General Comments General comments (skin integrity, edema, etc.): pt was tachy into the 120's, sats  were variable and with poor waveform, but generally stable.      Pertinent Vitals/Pain Pain Assessment: Faces Faces Pain Scale: Hurts a little bit Pain Location: grimace with R LE knee ext/ heel cord stretch Pain Descriptors / Indicators: Grimacing Pain Intervention(s): Monitored during  session    Home Living                      Prior Function            PT Goals (current goals can now be found in the care plan section) Acute Rehab PT Goals PT Goal Formulation: With family Time For Goal Achievement: 06/04/20 Potential to Achieve Goals: Good Progress towards PT goals: Progressing toward goals    Frequency    Min 4X/week      PT Plan Current plan remains appropriate    Co-evaluation PT/OT/SLP Co-Evaluation/Treatment: Yes Reason for Co-Treatment: Complexity of the patient's impairments (multi-system involvement);To address functional/ADL transfers   OT goals addressed during session: ADL's and self-care;Strengthening/ROM      AM-PAC PT "6 Clicks" Mobility   Outcome Measure  Help needed turning from your back to your side while in a flat bed without using bedrails?: A Lot Help needed moving from lying on your back to sitting on the side of a flat bed without using bedrails?: A Lot Help needed moving to and from a bed to a chair (including a wheelchair)?: Total Help needed standing up from a chair using your arms (e.g., wheelchair or bedside chair)?: Total Help needed to walk in hospital room?: Total Help needed climbing 3-5 steps with a railing? : Total 6 Click Score: 8    End of Session   Activity Tolerance: Patient tolerated treatment well Patient left: in chair;with call bell/phone within reach;with chair alarm set Nurse Communication: Mobility status PT Visit Diagnosis: Muscle weakness (generalized) (M62.81);Difficulty in walking, not elsewhere classified (R26.2);Hemiplegia and hemiparesis Hemiplegia - Right/Left: Right Hemiplegia - dominant/non-dominant: Dominant Hemiplegia - caused by: Cerebral infarction;Nontraumatic intracerebral hemorrhage     Time: 1450-1519 PT Time Calculation (min) (ACUTE ONLY): 29 min  Charges:  $Therapeutic Activity: 8-22 mins                     05/29/2020  Jacinto Halim., PT Acute Rehabilitation  Services [phone]  (pager) [phone]  (office)   Gail Montgomery 05/29/2020, 3:41 PM

## 2020-05-29 NOTE — Progress Notes (Signed)
ANTICOAGULATION CONSULT NOTE   Pharmacy Consult for Heparin Indication: 8/30 BL segmental PE   Allergies  Allergen Reactions  . 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10* 0.17*  CREATININE 0.64   < > 0.56 0.60 0.57  --    < > = values in this interval not displayed.    Estimated Creatinine Clearance: 116.5 mL/min (by C-G formula based on SCr of 0.57 mg/dL).  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 Patient with persistent intracranial hemorrhage per 8/26 CT head. Pharmacy consulted to start heparin no bolus. Will need to target low end of goal as much as possible.   8/31 8-hr level: subtherapeutic at 0.17. No issues with heparin gtt per Rn. Hgb has been trending down. PLT stable.   Goal of Therapy:  Heparin level 0.3- 0.5 units/ml Monitor platelets by anticoagulation protocol: Yes   Plan:  No boluses  Increase heparin drip to 1,150 units/hr Re-check heparin level in 6 hours Monitor for signs/symptoms of bleeding   9/31, PharmD PGY1 Acute Care Pharmacy Resident Please refer to [facility] for unit-specific pharmacist

## 2020-05-29 NOTE — Progress Notes (Signed)
Assisted tele visit to patient with mother and another family member.  Vena Austria, RN

## 2020-05-29 NOTE — Progress Notes (Addendum)
Occupational Therapy Treatment Patient Details Name: Gail Montgomery MRN: [id number] DOB: 06-Jul-1996 Today's Date: 05/29/2020    History of present illness 24 yo who had acute R sided weakness and aphasia 05/19/20 due to LVO at [facility]. Screened Covid + but asymptomatic; tPA and underwent thrombectomy; during procedure had ICA vasospasm vs dissection.  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 Pt self extubated 05/20/20.    OT comments  Pt progressing towards established OT goals. Pt requiring Max A +2 for stand pivot to recliner with R knee blocked. Pt grimacing and moaning with stretching of RLE. Despite fatigue and pain, pt motivated and agreeable to therapy. Pt participating in oral care with Mod A and cues throughout for sequencing. Continue to highly recommend dc to CIR and will continue to follow acutely as admitted.    Follow Up Recommendations  CIR;Supervision/Assistance - 24 hour    Equipment Recommendations  3 in 1 bedside commode;Other (comment)    Recommendations for Other Services Rehab consult    Precautions / Restrictions Precautions Precautions: Fall Precaution Comments: right sided weakness (R leg most significantly)       Mobility Bed Mobility Overal bed mobility: Needs Assistance Bed Mobility: Supine to Sit     Supine to sit: +2 for safety/equipment;Mod assist     General bed mobility comments: pt also needed Max cues and Mod A for unilateral /assymetrica scooting.  Transfers Overall transfer level: Needs assistance Equipment used: 2 person hand held assist Transfers: Sit to/from [employer] Sit to Stand: Mod assist;Max assist (or mod +2 with right knee blocking) Stand pivot transfers: Max assist;+2 physical assistance       General transfer comment: cues/hand over hand for hand placement, assist to come forward  and boost.  Blocking of R LE and assist with movement of right leg for pivot.    Balance Overall balance assessment: Needs assistance   Sitting balance-Leahy Scale: Fair Sitting balance - Comments: min guard, but not challenged   Standing balance support: Bilateral upper extremity supported Standing balance-Leahy Scale: Poor Standing balance comment: stood x4 with mod to max.  R knee blocked and controlled against flexor withdrawal                           ADL either performed or assessed with clinical judgement   ADL Overall ADL's : Needs assistance/impaired Eating/Feeding: Maximal assistance;Sitting   Grooming: Oral care;Moderate assistance;Sitting Grooming Details (indicate cue type and reason): Upright positioning in recliner. When presented toothbrush, pt reaching and brushing her teeth. Presenting pt with tooth paste, and she required Max cues to stop brushing and then place tooth paste on tooth brush. Pt able to brush and then take sips of water with Mod cues to rinse. Pt however, unable to sequence spitting.                  Toilet Transfer: Maximal assistance;+2 for physical assistance;+2 for safety/equipment;Stand-pivot (simualted to recliner) Toilet Transfer Details (indicate cue type and reason): Max A +2 for power up and then maintaining balance. Bilateral knees blocked                 Vision       Perception     Praxis      Cognition Arousal/Alertness: Awake/alert Behavior During Therapy: Flat affect Overall Cognitive Status: Difficult to assess Area of Impairment: Following commands  Current Attention Level: Focused;Sustained   Following Commands: Follows one step commands inconsistently;Follows one step commands with increased time     Problem Solving: Slow processing;Requires tactile cues General Comments: didn't acknowledge question Do you want your glasses until the glasses were shown to her.         Exercises Exercises: Other exercises Other Exercises Other Exercises: RLE being held in flexor synergy-stretched in opposite directions (hip adduction, internal rotation, extension; knee extension; ankle dorsiflexion and pronation). Noted rt heel cord tightening and able to achieve ~ 10 degrees DF with prolonged stretches.   Shoulder Instructions       General Comments pt was tachy into the 120's, sats were variable and with poor waveform, but generally stable. SpO2 stable on RA    Pertinent Vitals/ Pain       Pain Assessment: Faces Faces Pain Scale: Hurts a little bit Pain Location: grimace with R LE knee ext/ heel cord stretch Pain Descriptors / Indicators: Grimacing Pain Intervention(s): Monitored during session;Limited activity within patient's tolerance;Repositioned  Home Living                                          Prior Functioning/Environment              Frequency  Min 2X/week        Progress Toward Goals  OT Goals(current goals can now be found in the care plan section)  Progress towards OT goals: Progressing toward goals  Acute Rehab OT Goals Patient Stated Goal: mother would like her to return home with mom at d/c OT Goal Formulation: Patient unable to participate in goal setting Time For Goal Achievement: 06/04/20 Potential to Achieve Goals: Good ADL Goals Pt Will Perform Grooming: with min assist;sitting Pt Will Perform Upper Body Bathing: with min assist;sitting Pt Will Perform Lower Body Bathing: with mod assist;sit to/from stand Pt Will Transfer to Toilet: with mod assist;bedside commode;squat pivot transfer Additional ADL Goal #1: Pt will demosntrate sustained attnetion to task in nondistrating enviornment with minimal redurectional cues  Plan Discharge plan remains appropriate    Co-evaluation    PT/OT/SLP Co-Evaluation/Treatment: Yes Reason for Co-Treatment: Complexity of the patient's impairments (multi-system  involvement);For patient/therapist safety;To address functional/ADL transfers   OT goals addressed during session: ADL's and self-care      AM-PAC OT "6 Clicks" Daily Activity     Outcome Measure   Help from another person eating meals?: A Lot Help from another person taking care of personal grooming?: A Lot Help from another person toileting, which includes using toliet, bedpan, or urinal?: A Lot Help from another person bathing (including washing, rinsing, drying)?: Total Help from another person to put on and taking off regular upper body clothing?: Total Help from another person to put on and taking off regular lower body clothing?: Total 6 Click Score: 9    End of Session    OT Visit Diagnosis: Unsteadiness on feet (R26.81);Other abnormalities of gait and mobility (R26.89);Muscle weakness (generalized) (M62.81);Apraxia (R48.2);Other symptoms and signs involving cognitive function;Cognitive communication deficit (R41.841);Other symptoms and signs involving the nervous system (R29.898);Hemiplegia and hemiparesis Symptoms and signs involving cognitive functions: Cerebral infarction;Nontraumatic SAH Hemiplegia - Right/Left: Right Hemiplegia - dominant/non-dominant: Dominant Hemiplegia - caused by: Cerebral infarction;Nontraumatic SAH   Activity Tolerance Patient tolerated treatment well   Patient Left with call bell/phone within reach;in chair;with nursing/sitter in room;with chair alarm set  Nurse Communication Mobility status        Time: 1450-1525 OT Time Calculation (min): 35 min  Charges: OT General Charges $OT Visit: 1 Visit OT Treatments $Self Care/Home Management : 8-22 mins  Jasim Harari MSOT, OTR/L Acute Rehab Pager: [phone] Office: [phone]   Theodoro Grist Eustolia Drennen 05/29/2020, 6:14 PM

## 2020-05-30 DIAGNOSIS — I2699 Other pulmonary embolism without acute cor pulmonale: Secondary | ICD-10-CM

## 2020-05-30 LAB — HEPARIN LEVEL (UNFRACTIONATED)
Heparin Unfractionated: <0.1 IU/mL — ABNORMAL LOW (ref 0.30–0.70)
Heparin Unfractionated: 0.25 IU/mL — ABNORMAL LOW (ref 0.30–0.70)
Heparin Unfractionated: 0.37 IU/mL (ref 0.30–0.70)

## 2020-05-30 LAB — COMPREHENSIVE METABOLIC PANEL
ALT: 72 U/L — ABNORMAL HIGH (ref 0–44)
AST: 82 U/L — ABNORMAL HIGH (ref 15–41)
Albumin: 2.3 g/dL — ABNORMAL LOW (ref 3.5–5.0)
Alkaline Phosphatase: 53 U/L (ref 38–126)
Anion gap: 16 — ABNORMAL HIGH (ref 5–15)
BUN: 13 mg/dL (ref 6–20)
CO2: 16 mmol/L — ABNORMAL LOW (ref 22–32)
Calcium: 8.5 mg/dL — ABNORMAL LOW (ref 8.9–10.3)
Chloride: 98 mmol/L (ref 98–111)
Creatinine, Ser: 0.57 mg/dL (ref 0.44–1.00)
GFR calc Af Amer: >60 mL/min (ref >60)
GFR calc non Af Amer: >60 mL/min (ref >60)
Glucose, Bld: 94 mg/dL (ref 70–99)
Potassium: 4.1 mmol/L (ref 3.5–5.1)
Sodium: 130 mmol/L — ABNORMAL LOW (ref 135–145)
Total Bilirubin: 0.7 mg/dL (ref 0.3–1.2)
Total Protein: 8.3 g/dL — ABNORMAL HIGH (ref 6.5–8.1)

## 2020-05-30 LAB — PROTIME-INR
INR: 1.1 (ref 0.8–1.2)
Prothrombin Time: 13.6 seconds (ref 11.4–15.2)

## 2020-05-30 LAB — URINE CULTURE: Culture: >=100000 — AB

## 2020-05-30 LAB — CBC
HCT: 32.7 % — ABNORMAL LOW (ref 36.0–46.0)
Hemoglobin: 10.4 g/dL — ABNORMAL LOW (ref 12.0–15.0)
MCH: 28.3 pg (ref 26.0–34.0)
MCHC: 31.8 g/dL (ref 30.0–36.0)
MCV: 89.1 fL (ref 80.0–100.0)
Platelets: 187 10*3/uL (ref 150–400)
RBC: 3.67 MIL/uL — ABNORMAL LOW (ref 3.87–5.11)
RDW: 12.2 % (ref 11.5–15.5)
WBC: 13.7 10*3/uL — ABNORMAL HIGH (ref 4.0–10.5)
nRBC: 0 % (ref 0.0–0.2)

## 2020-05-30 LAB — PROCALCITONIN: Procalcitonin: 0.41 ng/mL

## 2020-05-30 NOTE — Progress Notes (Signed)
Updated pt's mother via phone. Numerous questions answered to best of my ability.   Mother expressed great concern about not being able to see pt via video or face to face. She would like to be able to come visit as soon as possible or be provided a ipad for her to be able to communicate with family.   She has numerous concerns about the care and progression or her daughter which I suspect are exacerbated by her distance from her child due to the covid restrictions.   I attempted to validate her concerns and explain many of them but have also directed them to patient experience    Additionally, per the [facility] covid isolation duration guidance for paiteitns with covid 10 days versus 21 days she has been asymptomatic from resp and gi standpoint in relation to covid. She has only been symptomatic from a hypercoagulable standpoint. She can come of precautions at this time. Transfer to med tele

## 2020-05-30 NOTE — Progress Notes (Signed)
ANTICOAGULATION CONSULT NOTE   Pharmacy Consult for Heparin Indication: 8/30 BL segmental PE   Allergies  Allergen Reactions  . 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    Patient Measurements: Height: 5\' 4"  (162.6 cm) Weight: 85.1 kg (187 lb 9.8 oz) IBW/kg (Calculated) : 54.7 Heparin Dosing Weight: 73kg  Vital Signs: Temp: 102.9 F (39.4 C) (09/01 2022) Temp Source: Axillary (09/01 2022) BP: 129/72 (09/01 1700) Pulse Rate: 123 (09/01 1800)  Labs: Recent Labs    05/28/20 0151 05/28/20 0151 05/28/20 0859 05/29/20 0055 05/29/20 1102 05/30/20 0317 05/30/20 1235 05/30/20 2038  HGB 9.6*   < >  --  9.4*  --  10.4*  --   --   HCT 28.7*  --   --  28.4*  --  32.7*  --   --   PLT 190  --   --  242  --  187  --   --   LABPROT  --   --   --  13.7  --  13.6  --   --   INR  --   --   --  1.1  --  1.1  --   --   HEPARINUNFRC  --   --   --  <0.10*   < > <0.10* 0.25* 0.37  CREATININE 0.56   < > 0.60 0.57  --  0.57  --   --    < > = values in this interval not displayed.    Estimated Creatinine Clearance: 116.5 mL/min (by C-G formula based on SCr of 0.57 mg/dL).  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 Also, 8/30 LE doppler consistent with acute DVT in RLE. Patient with persistent intracranial hemorrhage per 8/26 CT head. Pharmacy consulted to start heparin no bolus. Will target low end of goal as much as possible.  -heparin level= 0.37 on 1450 units/hr   Goal of Therapy:  Heparin level 0.3- 0.5 units/ml Monitor platelets by anticoagulation protocol: Yes   Plan:  Continue heparin at 1450 units/hr Monitor daily CBC and heparin level Monitor for signs/symptoms of bleeding Plan to transition to Eliquis once in therapeutic range for at least 48 hours.   9/26,  PharmD Clinical Pharmacist **Pharmacist phone directory can now be found on [email or web address] ([email or web address]).  Listed under [facility].

## 2020-05-30 NOTE — Progress Notes (Signed)
STROKE TEAM PROGRESS NOTE   INTERVAL HISTORY Patient remains nonverbal but interacting with facial expressions, and following midline and one-step commands quite well consistently.  No events overnight, she has been started on IV heparin with level 0.25 this am. She is less tachycardic today but yet febrile with T-max 100.5.  But oxygen sats seem adequately maintained even on room air.  No obvious source of infection noted so far.  Updated mom over phone and answered questions and concerns. OBJECTIVE Vitals:   05/30/20 1024 05/30/20 1100 05/30/20 1152 05/30/20 1200  BP:  115/71  (!) 126/98  Pulse:  (!) 114  (!) 123  Resp:  (!) 22  20  Temp: 99.5 F (37.5 C)  (!) 100.9 F (38.3 C)   TempSrc: Rectal  Rectal   SpO2:  97%  95%  Weight:      Height:       CBC:  Recent Labs  Lab 05/29/20 0055 05/30/20 0317  WBC 15.0* 13.7*  HGB 9.4* 10.4*  HCT 28.4* 32.7*  MCV 86.6 89.1  PLT 242 187   Basic Metabolic Panel:  Recent Labs  Lab 05/28/20 0151 05/28/20 0859 05/29/20 0055 05/30/20 0317  NA 129*   < > 130* 130*  K 4.1   < > 3.8 4.1  CL 99   < > 98 98  CO2 18*   < > 21* 16*  GLUCOSE 115*   < > 104* 94  BUN 9   < > 9 13  CREATININE 0.56   < > 0.57 0.57  CALCIUM 8.2*   < > 8.4* 8.5*  MG 2.0  --  2.1  --   PHOS  --   --  3.8  --    < > = values in this interval not displayed.   Lipid Panel:     Component Value Date/Time   CHOL 156 05/20/2020 0958   TRIG 105 05/20/2020 0958   TRIG 104 05/20/2020 0958   HDL 57 05/20/2020 0958   CHOLHDL 2.7 05/20/2020 0958   VLDL 21 05/20/2020 0958   LDLCALC 78 05/20/2020 0958   HgbA1c:  Lab Results  Component Value Date   HGBA1C 5.6 05/20/2020   Urine Drug Screen:     Component Value Date/Time   LABOPIA NONE DETECTED 05/20/2020 1736   COCAINSCRNUR NONE DETECTED 05/20/2020 1736   LABBENZ NONE DETECTED 05/20/2020 1736   AMPHETMU NONE DETECTED 05/20/2020 1736   THCU POSITIVE (A) 05/20/2020 1736   LABBARB NONE DETECTED 05/20/2020  1736    Alcohol Level [facility] results found [facility]: [facility]  CT HEAD CODE STROKE WO CONTRAST 05/19/2020 Dense left ICA terminus and MCA. No acute hemorrhage. ASPECTS is 10.   CT Code Stroke CTA Head W/WO contrast CT Code Stroke CTA Neck W/WO contrast 05/19/2020 1. 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  2. Mildly indistinct appearance of the proximal left ICA is likely from motion artifact, consider cervical run. No atheromatous changes or vasculopathy seen in the neck.   Neuro Interventional Radiology - Cerebral Angiogram with Intervention - Dr Loreta AveWagner 05/19/20 2:27 PM Left MCA: Baseline TICI 0 First Pass Device:                 Local aspiration and solitaire 4 x 40              Result  TICI 0 Second Pass Device:           Local aspiration and solitaire 4 x 40              Result                         TICI 3 Left ACA/pericallosal  Baseline TICI 0,    Final TICI 0 pericallosal artery Findings:  Patent right CFA Left M1 occlusion, final is TICI 3  Left ACA A2 occlusion migrated to pericallosal artery during the case.  Could not complete a first pass safely.  Significant irregularity of the cervical ICA, concerning for dissection, that resolved after observation, and once DAPT was initiated.  The final appearance may represent spasm vs FMD/vasculitis  CT HEAD WO CONTRAST 05/19/2020 New large volume of subarachnoid and intraventricular hemorrhage.  CT HEAD WO CONTRAST 05/20/2020 1. Subarachnoid hemorrhage along the anterior interhemispheric fissure with slightly increased extension into both lateral ventricles.  2. Slight increase in size of the temporal horn of the left lateral ventricle consistent with early communicating hydrocephalus.   MR BRAIN WO CONTRAST 05/20/2020 1. Early subacute infarct of the left basal ganglia involving the posterior putamen and caudate body.  2. Subarachnoid hemorrhage along the  interhemispheric fissure extending into the lateral ventricles.  3. 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   CT HEAD WO CONTRAST 05/21/2020 1. Stable appearance of left basal ganglia nonhemorrhagic infarction. 2. Stable medial left frontal extra-axial hemorrhage, extending into the ventricles bilaterally. 3. Subarachnoid hemorrhage posteriorly on the left and along the inter cerebral hemisphere likely represents redistribution without significant new hemorrhage. 4. Hemorrhage is present within the aqueduct of Sylvius. 5. Progressive dilation of the ventricles compatible with developing hydrocephalus. 6. No new areas of hemorrhage.   CT HEAD WO CONTRAST 05/22/2020 1. Regressed intraventricular hemorrhage since yesterday with small volume residual. Stable mild ventriculomegaly, and possible mild transependymal edema. 2. Stable left inferior frontal gyrus parasagittal hematoma (approximately 12 mL) and regional edema. No significant midline shift, basilar cisterns are patent. 3. Stable left basal ganglia infarcts. 4. No new intracranial abnormality.   CT HEAD WO CONTRAST 05/24/20 IMPRESSION: 1. 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 2. Unchanged intracranial hemorrhage with mild lateral ventriculomegaly.  ECHOCARDIOGRAM COMPLETE 05/20/2020 1. Left ventricular ejection fraction, by estimation, is 60 to 65%. The left ventricle has normal function. The left ventricle has no regional wall motion abnormalities. Left ventricular diastolic parameters were normal.   2. Right ventricular systolic function is normal. The right ventricular size is normal.   3. The mitral valve is normal in structure. Trivial mitral valve regurgitation. No evidence of mitral stenosis.   4. The aortic valve is normal in structure. Aortic valve regurgitation is not visualized. No aortic stenosis is present.   5. The inferior vena cava is normal in size with  greater than 50% respiratory variability, suggesting right atrial pressure of 3 mmHg.   Bilateral Lower Extremity Venous Dopplers  05/21/2020 RIGHT: - There is no evidence of deep vein thrombosis in the lower extremity. However, portions of this examination were limited- see technologist comments above.  - No cystic structure found in the popliteal fossa.   LEFT: - There is no evidence of deep vein thrombosis in the lower extremity. However, portions of this examination were limited- see technologist comments above.  - No cystic structure found in the popliteal fossa.  Transcranial Doppler w/ Bubble 05/25/2020 Not cooperative for Valsalva.  Bubble study showed Spencer degree 3 at rest.  Recommend TEE for further confirmation.  Consider TEE after Covid isolation and before CIR discharge CT head 05/29/2020 :Interval decrease in the size/conspicuity of intraparenchymal, extra-axial, and intraventricular hemorrhage, as above. No new acute hemorrhage. PHYSICAL EXAM  Temp:  [99.5 F (37.5 C)-103.1 F (39.5 C)] 100.9 F (38.3 C) (09/01 1152) Pulse Rate:  [95-138] 123 (09/01 1200) Resp:  [16-31] 20 (09/01 1200) BP: (90-146)/(61-107) 126/98 (09/01 1200) SpO2:  [93 %-100 %] 95 % (09/01 1200)  General -mildly obese young African-American lady Ophthalmologic - fundi not visualized due to noncooperation.   Cardiovascular - regular rhythm and rate  Neurological exam : Awake alert with flat affect, eyes open, nonverbal not answering questions, she will raise eyebrows and shrug.  Able to follow simple commands on both hands and left foot. Able to have bilateral gaze with voice on both sides, however right gaze incomplete, still has left gaze preference. PERRL. Blinking to visual threat bilaterally. Right facial droop. Tongue protrusion not cooperative. LUE spontaneous movement and LLE strong withdraw to pain. RUE proximal 4/5, distal finger grip 4/5. RLE plegic with only slight withdraw to pain.  Sensation, coordination not cooperative and gait not tested.   ASSESSMENT/PLAN Ms. Gail Montgomery is a 24 y.o. female with history of tobacco use, anxiety (panic attacks) and hormonal birth control presented with right-sided weakness and aphasia that started abruptly at 9:30 AM.  COVID - positive. IV t-PA - Saturday 05/19/20 at 1145. Thrombectomy - Dr Loreta Ave - Left M1 occlusion, final is TICI 3.   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 No acute hemorrhage. ASPECTS is 10.      CTA H&N - Emergent large vessel occlusion at the left ICA terminus continuing into the MCA. Mildly indistinct appearance of the proximal left ICA is likely from motion artifact, consider cervical run. No atheromatous changes or vasculopathy seen in the neck.  IR - left MCA and A2 occlusion s/p TICI3 of MCA and TICI0 of ACA. Initially left ICA stenosis post IR but resolved on the final run  CT head 8/21 - New large volume of subarachnoid and intraventricular hemorrhage.   CT head 8/22 [facility] along the anterior interhemispheric fissure with slightly increased extension into both lateral ventricles. Slight increase in size of the temporal horn of the left lateral ventricle consistent with early communicating hydrocephalus.   MRI head 8/22 -  Early subacute infarct of the left basal ganglia involving the posterior putamen and caudate body.  CT head 8/23 - stable L basal ganglia infarct, ICH/IVH/SAH. Progressive dilation of ventricles. CT head 8/24 - regressed IVH. Stable infarct and frontal ICH. Stable ventriculomegaly CT head 8/26 - stable infarcts & hemorrhage w/ mild ventriculomegaly  LE Venous Dopplers - no DVT  2D Echo EF 60-65%  TCD  bubble study Spencer degree 3 at rest.  Not cooperative for Valsalva.  Sars Corona Virus 2 - positive  LDL - 78  HgbA1c 5.6  Hypercoagulable and autoimmune labs ANCA proteinase 3 ab 6.0 (h) ANA positive - C - ANCA titers < 1:20 (  neg)  ;  P - ANCA titers < 1:20 (neg)  UDS - THC positive  VTE prophylaxis - SCDs  No antithrombotic prior to admission, now on No antithrombotic due to [facility] and IVH  Ongoing aggressive stroke risk factor management  Therapy recommendations:  CIR   Disposition:  Pending   Hydrocephalus, mild  CT head showed mildly increased size of left temporal horn   CT repeat stable hemorrhage but mild progression of hydrocephalus  CT head 8/24 - regressed IVH. Stable infarct and frontal ICH. Stable mild ventriculomegaly  NSG on board  3% hypertonic saline - 50 cc/hr ->25cc->10cc -> off  Na - 139->149->154->148->152->150->140->139->138->138->133  PICC placed 8/23   Repeat CT on 8/26 stable  PFO  TCD bubble study showed Spencer degree 3 at rest.  Not cooperative with Valsalva  Consider TEE after Covid isolation and before CIR discharge  If PFO confirmed, consider to refer to Dr. Excell Seltzer cardiology for PFO closure.  BP management  Stable  On Nimodipine for [facility] - discussed with Dr. Conchita Paris, will keep nimodipine while inpt - d/c on discharge if neuro stable  SBP goal < 160 given SAH and IVH (SBP 99 - 125 this AM) . Long-term BP goal normotensive  Hyperlipidemia  Home Lipid lowering medication: none   LDL 78, goal < 70  No statin at this time due to [facility] and IVH  Consider low dose statin at discharge  ? Hypercoagulable state   COVID positive - asymptomatic  D-dimer > 20->16.13->17.44-> >20-> >20   OCP user with smoking and THC use  Hypercoagulable work up (so far) - ANCS proteinase 3 - 6.0 (H) (0.0 - 3.5)   - C - ANCA titers < 1:20 (neg)  ;  P - ANCA titers < 1:20 (neg)  Myeloperoxidase Abs - < 9.0 (WNL)  COVID - 19  infection  COVID + test 8/21  Asymptomatic  Ferritin 24->30->37->51->93->92  Fibrinogen 289  CRP 0.8->2.4->3.3->3.9->8.7->9.1   D-dimer > 20->16.13->17.44-> >20-> >20 -> >20   COVID Ab titer > 2500  Per ID, ok to be off isolation in 10 days following + test as asymptomatic (05/29/2020)  Leukocytosis, Fever  Etiology unknown  TMax 101.2->103.1->100.1->102.3->99.1->103->102->101.4  WBC 10.9->12.2->12.5->11.0->14.0->13.9->17.0  UA small LE, rare bact, 0-5 WBC - neg  CXR neg  Urine culture - 8/25 - no growth  Blood Cx - 8/25 - NG x 3 days - final pending  On vanco and zosyn empiric treatment 8/25>>discontinued 8/27 - now off all abxs  CCM on board  Tachycardia  HR 110-120s  Likely due to fever  Continue monitoring  Euthyroid sick syndrome  Low TSH 0.054   normal free T4 and T3  Likely related to critical illness  Tobacco abuse  Intermittent smoking per mom  Smoking cessation counseling will be provided  Other Stroke Risk Factors  ETOH use, advised to drink no more than 1 alcoholic beverage per day  Obesity, Body mass index is 32.2 kg/m., recommend weight loss, diet and exercise as appropriate   Hormonal birth control - both progesterone and estrogen - Estarylla  Other Active Problems  Code status - Full code  Mild anemia 11.2->10.4->11.6->11.4->10.6->10.1->10.3  Hypokalemia 3.3->4.4->3.2 ->3.4 ->3.1->3.5 - supplement - 3.8->3.8  Low normal B12 level 300 - supplement  Thrombocytopenia - 209->137->129->148  PICC line placed 8/23  Hospital day # 11   Patient remains tachycardic and febrile likely due to her acute DVT and pulmonary emboli but fortunately is hemodynamically stable and maintaining oxygen saturations quite well.  She remains at risk for increasing hemorrhage with full anticoagulation however due to her hypercoagulable state with Covid and recent embolic stroke as well as now pulmonary embolism and acute DVT she will benefit  with anticoagulation with IV heparin drip for now and will change to eliquis after 7-10 days.  I had a long discussion over the phone with the patient's mother   regarding this delicate situation and the need to anticoagulate despite recent intracerebral hemorrhage which hopefully was a one-time event caused by the TPA and the mechanical thrombectomy procedure. Mother continues to have concerns about pt`s care particularly on her being moved in and out of ICU and floor beds and plans to discuss this with office of patient experience. Recommend continue IV heparin stroke protocol with close monitoring by pharmacy to keep heparin level in the lower therapeutic range.  Hopefully transition to Eliquis when she gets transferred to rehab.  Discussed with Dr. Gaynell Face  critical care medicine. This patient is critically ill and at significant risk of neurological worsening, death and care requires constant monitoring of vital signs, hemodynamics,respiratory and cardiac monitoring, extensive review of multiple databases, frequent neurological assessment, discussion with family, other specialists and medical decision making of high complexity.I have made any additions or clarifications directly to the above note.This critical care time does not reflect procedure time, or teaching time or supervisory time of PA/NP/Med Resident etc but could involve care discussion time.  I spent 32 minutes of neurocritical care time  in the care of  this patient.   Delia Heady, MD     To contact Stroke Continuity provider, please refer to [email or web address]. After hours, contact General Neurology

## 2020-05-30 NOTE — Progress Notes (Signed)
Assisted tele visit to patient with mother.  [employer] M [name], RN   

## 2020-05-30 NOTE — Progress Notes (Signed)
ANTICOAGULATION CONSULT NOTE   Pharmacy Consult for Heparin Indication: 8/30 BL segmental PE   Allergies  Allergen Reactions  . 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    Patient Measurements: Height: 5\' 4"  (162.6 cm) Weight: 85.1 kg (187 lb 9.8 oz) IBW/kg (Calculated) : 54.7 Heparin Dosing Weight: 73kg  Vital Signs: Temp: 100.9 F (38.3 C) (09/01 1152) Temp Source: Rectal (09/01 1152) BP: 126/98 (09/01 1200) Pulse Rate: 123 (09/01 1200)  Labs: Recent Labs    05/28/20 0151 05/28/20 0151 05/28/20 0859 05/29/20 0055 05/29/20 1102 05/29/20 1938 05/30/20 0317 05/30/20 1235  HGB 9.6*   < >  --  9.4*  --   --  10.4*  --   HCT 28.7*  --   --  28.4*  --   --  32.7*  --   PLT 190  --   --  242  --   --  187  --   LABPROT  --   --   --  13.7  --   --  13.6  --   INR  --   --   --  1.1  --   --  1.1  --   HEPARINUNFRC  --   --   --  <0.10*   < > 0.25* <0.10* 0.25*  CREATININE 0.56   < > 0.60 0.57  --   --  0.57  --    < > = values in this interval not displayed.    Estimated Creatinine Clearance: 116.5 mL/min (by C-G formula based on SCr of 0.57 mg/dL).  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 Also, 8/30 LE doppler consistent with acute DVT in RLE. Patient with persistent intracranial hemorrhage per 8/26 CT head. Pharmacy consulted to start heparin no bolus. Will target low end of goal as much as possible.   9/1: 8-hr level remains subtherapeutic at 0.25 on 1350 units/hr. Hgb slightly up at 10.4, Plt 187. No overt signs of bleeding documented.   Goal of Therapy:  Heparin level 0.3- 0.5 units/ml Monitor platelets by anticoagulation protocol: Yes   Plan:  Inc heparin drip to 1450 units/hr Re-check heparin level in 6 hours Monitor daily CBC Monitor for  signs/symptoms of bleeding Plan to transition to Eliquis once in therapeutic range for at least 48 hours.   11/1, PharmD PGY1 Acute Care Pharmacy Resident 05/30/2020 2:08 PM  Please check [email or web address] for unit-specific pharmacy phone numbers.

## 2020-05-30 NOTE — Progress Notes (Addendum)
Physical Therapy Treatment Patient Details Name: Gail Montgomery MRN: [id number] DOB: 08/12/1996 Today's Date: 05/30/2020    History of Present Illness 24 yo who had acute R sided weakness and aphasia 2/42/35 due to LVO at [facility]. Screened Covid + but asymptomatic; tPA and underwent thrombectomy; during procedure had ICA vasospasm vs dissection.  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 Pt self extubated 05/20/20.     PT Comments    Pt confirming agreement to work with therapies with thumbs up.  Needing non verbal cues to improve reliability of answers or get pt to follow commands more consistently.  Emphasis on transition to EOB, prolonged sitting balance at EOB showing LOB with loss of focus on task, sit to stand trials with R knee extension/control with w/bearing, transfers with need to advance R LE. Sats on RA generally in the 90's,  EHR climbed into the low 130's.    Follow Up Recommendations  CIR     Equipment Recommendations  Wheelchair (measurements PT);Wheelchair cushion (measurements PT);Hospital bed;3in1 (PT)    Recommendations for Other Services       Precautions / Restrictions Precautions Precautions: Fall Precaution Comments: right sided weakness (R leg most significantly)    Mobility  Bed Mobility Overal bed mobility: Needs Assistance Bed Mobility: Supine to Sit     Supine to sit: Mod assist;Max assist;+2 for safety/equipment     General bed mobility comments: up via R elbow with truncal assist, TC for LE direction off the bed.  mac cues for scooting mod assist, some with pad.  Transfers Overall transfer level: Needs assistance   Transfers: Sit to/from Stand;Stand Pivot Transfers Sit to Stand: Mod assist;Max assist;+2 physical assistance Stand pivot transfers: Max assist;+2 physical assistance       General transfer comment: cues  for hand placement, face to face assist to come forward and stand and 1 additional person to work on R knee ext/heel on floor and increased w/bearing through heel/foot..  Face to face pivot assist to chair with max assist to advance the R LE.  Ambulation/Gait                 Stairs             Wheelchair Mobility    Modified Rankin (Stroke Patients Only) Modified Rankin (Stroke Patients Only) Pre-Morbid Rankin Score: No symptoms Modified Rankin: Severe disability     Balance Overall balance assessment: Needs assistance Sitting-balance support: Single extremity supported;No upper extremity supported Sitting balance-Leahy Scale: Fair (to poor with loss of focus) Sitting balance - Comments: min guard at best with cuing to come forward into midline and stay upright.  Tends to list posteriorly and right over time. Postural control: Posterior lean;Right lateral lean Standing balance support: Bilateral upper extremity supported Standing balance-Leahy Scale: Poor Standing balance comment: stood x3, 2 trials working on w/bearing on R LE in knee extension with heel on the floor.  W/shifting to/from R side.                            Cognition Arousal/Alertness: Awake/alert Behavior During Therapy: WFL for tasks assessed/performed Overall Cognitive Status: Difficult to assess Area of Impairment: Following commands;Attention;Safety/judgement;Awareness;Problem solving                   Current Attention Level: Focused;Sustained   Following Commands: Follows one step commands inconsistently;Follows one  step commands with increased time Safety/Judgement: Decreased awareness of safety;Decreased awareness of deficits Awareness: Intellectual Problem Solving: Slow processing;Requires tactile cues        Exercises Other Exercises Other Exercises: prolonged stretch into knee extension, with add/IR moment.  Pt grimacing in this full movement.  Pt positioned in  extension with pillows and asked to stay that way by pointing and verbalizing the time of the clock  "30 min"    General Comments General comments (skin integrity, edema, etc.): On RA with activity, HR 139  in standing.  Sats on RA mostly in the 90's, the sats dipped down into the mid 80's, but the waveform was noisy.  BP in sitting 130/92.      Pertinent Vitals/Pain Faces Pain Scale: Hurts little more Pain Location: grimace with R LE knee ext/add/IR  pt points to area over IT band Pain Descriptors / Indicators: Grimacing Pain Intervention(s): Monitored during session;Repositioned    Home Living                      Prior Function            PT Goals (current goals can now be found in the care plan section) Acute Rehab PT Goals Patient Stated Goal: mother would like her to return home with mom at d/c PT Goal Formulation: With family Time For Goal Achievement: 06/04/20 Potential to Achieve Goals: Good Progress towards PT goals: Progressing toward goals    Frequency    Min 4X/week      PT Plan Current plan remains appropriate    Co-evaluation              AM-PAC PT "6 Clicks" Mobility   Outcome Measure  Help needed turning from your back to your side while in a flat bed without using bedrails?: A Lot Help needed moving from lying on your back to sitting on the side of a flat bed without using bedrails?: A Lot Help needed moving to and from a bed to a chair (including a wheelchair)?: Total Help needed standing up from a chair using your arms (e.g., wheelchair or bedside chair)?: Total Help needed to walk in hospital room?: Total Help needed climbing 3-5 steps with a railing? : Total 6 Click Score: 8    End of Session   Activity Tolerance: Patient tolerated treatment well Patient left: in chair;with call bell/phone within reach;with chair alarm set Nurse Communication: Mobility status PT Visit Diagnosis: Muscle weakness (generalized)  (M62.81);Difficulty in walking, not elsewhere classified (R26.2);Other abnormalities of gait and mobility (R26.89) Hemiplegia - Right/Left: Right Hemiplegia - dominant/non-dominant: Dominant Hemiplegia - caused by: Cerebral infarction;Nontraumatic intracerebral hemorrhage     Time: 8756-4332 PT Time Calculation (min) (ACUTE ONLY): 31 min  Charges:  $Therapeutic Activity: 8-22 mins $Neuromuscular Re-education: 8-22 mins                     05/30/2020  Ginger Carne., PT Acute Rehabilitation Services [phone]  (pager) [phone]  (office)   Tessie Fass Chinita Schimpf 05/30/2020, 3:29 PM

## 2020-05-30 NOTE — Progress Notes (Signed)
ANTICOAGULATION CONSULT NOTE   Pharmacy Consult for Heparin Indication: 8/30 BL segmental PE   Allergies  Allergen Reactions  . 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    Patient Measurements: Height: 5\' 4"  (162.6 cm) Weight: 85.1 kg (187 lb 9.8 oz) IBW/kg (Calculated) : 54.7 Heparin Dosing Weight: 73kg  Vital Signs: Temp: 101.1 F (38.4 C) (09/01 0505) Temp Source: Rectal (09/01 0505) BP: 116/83 (09/01 0505) Pulse Rate: 132 (09/01 0505)  Labs: Recent Labs    05/28/20 0151 05/28/20 0151 05/28/20 0859 05/29/20 0055 05/29/20 0055 05/29/20 1102 05/29/20 1938 05/30/20 0317  HGB 9.6*   < >  --  9.4*  --   --   --  10.4*  HCT 28.7*  --   --  28.4*  --   --   --  32.7*  PLT 190  --   --  242  --   --   --  187  LABPROT  --   --   --  13.7  --   --   --  13.6  INR  --   --   --  1.1  --   --   --  1.1  HEPARINUNFRC  --   --   --  <0.10*   < > 0.17* 0.25* <0.10*  CREATININE 0.56   < > 0.60 0.57  --   --   --  0.57   < > = values in this interval not displayed.    Estimated Creatinine Clearance: 116.5 mL/min (by C-G formula based on SCr of 0.57 mg/dL).  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 Patient with persistent intracranial hemorrhage per 8/26 CT head. Pharmacy consulted to start heparin no bolus.   Will target low end of goal as much as possible.   9/1 AM update:  Heparin level undetectable, was trending up Has been running good per RN  Goal of Therapy:  Heparin level 0.3- 0.5 units/ml Monitor platelets by anticoagulation protocol: Yes   Plan:  No boluses  Inc heparin drip to 1350 units/hr Re-check heparin level in 6-8 hours Monitor for signs/symptoms of bleeding    11/1, PharmD, BCPS Clinical Pharmacist Phone: [phone]

## 2020-05-30 NOTE — Progress Notes (Signed)
Assisted tele visit to patient with mother.  Dornell Grasmick Samson, RN  

## 2020-05-30 NOTE — Progress Notes (Signed)
NAME:  Gail Montgomery, MRN:  [id number], DOB:  1995-10-26, LOS: 11 ADMISSION DATE:  05/19/2020, CONSULTATION DATE:  05/28/2020 REFERRING MD:  Aroor CHIEF COMPLAINT: Fever , tachycardia  Brief History   24 year old female without significant PMH on BCP, some recreational mariajuana use. She was normal upon waking the morning of  8/21.  At 09:30 she had sudden onset of right hemiparesis, and aphasia. She presented 8/21/2021to the [facility] ED as a Code Stroke  with acute left MCA syndrome. She received tPA at 11:29 am she was deemed a  candidate for thrombectomy, and was taken to IR.  Developed SAH subsequently.  PCCM was initially consulted for vent management, patient apparently self extubated on 8/22.   PCCM was not following, then apparently asked to reconsult for fever and tachycardia on 8/27, and there have been no notes since.    History of present illness   24 year old female normal upon waking up this morning 8/21 2021. At 09:30 she had sudden onset of right hemiparesis, and aphasia. She presents 8/21/2021to the [facility] ED as a Code Stroke  with acute left MCA syndrome.She was screened as Covid +, but was asymptomatic.  She received tPA at 11:29 am , she was deemed a  candidate for thrombectomy, and was taken to IR..In IR she had successful  opening of her LMCA with reperfusion, but LACA has a residual small distal anterior cerebral clot.  During procedure there was  ICA vasospasm vs dissection. She was loaded with ASA and Plavix. There was concern that she would need carotid  stenting . However upon re-imaging the vessel was open, and she did not require stent.  SAH, Parenchymal hemorrhage, and intraventricular hemorrhage.   She has had persistent, intermittent fevers since 8/24.   Also has had sinus tachycardia, from 110s to 130s, occasionally 140s 150s.  Tachy seems to correlate with the severity of fever mostly, with some outliers.    On zosyn and vanc from 8/25-8/27 Thought fevers possibly 2/2  covid vs stroke, abx stopped 8/27.  Good po intake, good UOP>   Current meds: nimodipine 60 q 4, protonix, thiamine, b12 Tylenol q 4 650mg   Blood and urine cultures 8/25 negative  Past Medical History   Past Medical History:  Diagnosis Date  . Anxiety   . Panic attack     Significant Hospital Events   05/19/2020 Admission to [facility] with acute L MCA syndrome  Consults:  8/21 PCCM, 8/27 reconsult, 8/30 reconsult   Procedures:  8/21: Mechanical Thrombectomy   Significant Diagnostic Tests:  05/19/2020 CTA Brain Cut off at the left ICA terminus with visible luminal clot. There is continuation of non opacification throughout the left M1 segment which is clot by CT. Non opacification at the left A1 origin. No contralateral branch occlusion is seen. Intravascular enhancement seen in the left operculum branches on the delayed phase.  8/30 CTA chest> bilateral segmental lower lobe pulmonary emboli L>R. Equivocal segmental emboli in L upper lobe. Mild cardiomegaly   8/30 LE doppler: RIGHT:  - Findings consistent with acute deep vein thrombosis involving the right  femoral vein, right popliteal vein, and right gastrocnemius veins.  - No cystic structure found in the popliteal fossa.    LEFT:  - There is no evidence of deep vein thrombosis in the lower extremity.    - No cystic structure found in the popliteal fossa.  8/31 cth: 1. Interval decrease in the size/conspicuity of intraparenchymal, extra-axial, and intraventricular hemorrhage, as above. No  new acute hemorrhage. 2. Redemonstrated subacute left ACA and left MCA territory infarcts. No progressive mass effect. 3. Slightly improved mild ventriculomegaly.     Micro Data:  05/19/2020  SARS Coronavirus 2 NEGATIVE POSITIVEAbnormal   05/19/2020 >>> Blood Cultures x 2  8/25 blood cultures negative  Urine culture negative Antimicrobials:  8/25 Vanc>8/28 8/25 zosyn> 8/28 Interim history/subjective:  Pt remains on  heparin gtt but subtherapeutic. Neuro still same and on RA. Will transfer to floor with tele under TRH service.   Objective   Blood pressure (!) 130/91, pulse (!) 119, temperature (!) 100.9 F (38.3 C), temperature source Rectal, resp. rate 20, height 5\' 4"  (1.626 m), weight 85.1 kg, SpO2 99 %.        Intake/Output Summary (Last 24 hours) at 05/30/2020 1201 Last data filed at 05/30/2020 1015 Gross per 24 hour  Intake 322.1 ml  Output 75 ml  Net 247.1 ml   Filed Weights   05/19/20 1100 05/29/20 0400  Weight: 84.2 kg 85.1 kg    Examination:  General: sitting up in bed awake and eating breakfast HENT: NCAT, mild R droop sclerae intact Lungs: Symmetrical chest expansion, no accessory muscle use on RA  Cardiovascular: Tachycardic rate regular rhythm  Abdomen: soft.  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 -Bubble study with spencer grade 3 pfo  P Stroke team following Frequent neuro exams q4 -- cont heparin for acute PE and extensive RLE dvt -follow for at least 48 hours after in therapeutic range before transition to eliquis Repeat CT H per neuro: stable Acute Pulmonary Embolism - covid 19, not on dvt ppx in setting of ICH  -Mild cardiomegaly on CTA chest  P Heparin per pharmacy neuro monitoring in setting of ICH  R LE dvt   COVID-19 positive -incidental finding, fully vaccinated with pfizer vaccine ~1 month ago and without evidence of PNA P Continue contact/airborne precautions Continuous SpO2 monitoring  SIRS: Tachycardia, Fevers, mild leukocytosis -ddx infection vs reactive (in setting of PE) or central fevers (in setting of intracranial process) PCT with slight  increase from < 0.1 to 0.25  P  Trend CBC WBC repeat BCx ngtd Ice/cooling measures for fever as tolerates with tmax to 100.9  Best practice:  Diet: Full diet Pain/Anxiety/Delirium protocol (if indicated): No  sedation  VAP protocol (if indicated): Not intubated DVT prophylaxis: full dose heparin GI prophylaxis: Protonix Glucose control: CBG and SSI Mobility: BR Code Status: Full Family Communication: pending 8/31 Dispo:transfer to floor at this time. TRH to assume care CCM will sign off 9/2  Labs   CBC: Recent Labs  Lab 05/26/20 0539 05/27/20 0629 05/28/20 0151 05/29/20 0055 05/30/20 0317  WBC 13.9* 17.0* 15.7* 15.0* 13.7*  HGB 10.1* 10.3* 9.6* 9.4* 10.4*  HCT 31.7* 31.7* 28.7* 28.4* 32.7*  MCV 91.1 89.8 87.2 86.6 89.1  PLT 129* 148* 190 242 187    Basic Metabolic Panel: Recent Labs  Lab 05/27/20 0629 05/28/20 0151 05/28/20 0859 05/29/20 0055 05/30/20 0317  NA 133* 129* 131* 130* 130*  K 3.8 4.1 3.9 3.8 4.1  CL 103 99 99 98 98  CO2 18* 18* 19* 21* 16*  GLUCOSE 123* 115* 115* 104* 94  BUN 10  9 8 9 13   CREATININE 0.64 0.56 0.60 0.57 0.57  CALCIUM 8.3* 8.2* 8.5* 8.4* 8.5*  MG  --  2.0  --  2.1  --   PHOS  --   --   --  3.8  --    GFR: Estimated Creatinine Clearance: 116.5 mL/min (by C-G formula based on SCr of 0.57 mg/dL). Recent Labs  Lab 05/26/20 0539 05/26/20 1300 05/27/20 0629 05/28/20 0151 05/28/20 0438 05/28/20 1225 05/29/20 0055 05/30/20 0317  PROCALCITON  --  <0.10  --   --   --  0.25 0.32 0.41  WBC   < >  --  17.0* 15.7*  --   --  15.0* 13.7*  LATICACIDVEN  --   --   --   --  0.8  --   --   --    < > = values in this interval not displayed.    Liver Function Tests: Recent Labs  Lab 05/28/20 0859 05/29/20 0055 05/30/20 0317  AST 71* 66* 82*  ALT 56* 57* 72*  ALKPHOS 44 43 53  BILITOT 0.3 0.2* 0.7  PROT 8.2* 7.2 8.3*  ALBUMIN 2.4* 2.0* 2.3*   No results for input(s): LIPASE, AMYLASE in the last 168 hours. No results for  input(s): AMMONIA in the last 168 hours.  ABG    Component Value Date/Time   PHART 7.401 05/19/2020 1732   PCO2ART 33.8 05/19/2020 1732   PO2ART 394 (H) 05/19/2020 1732   HCO3 21.3 05/19/2020 1732   TCO2 22 05/19/2020 1732   ACIDBASEDEF 3.0 (H) 05/19/2020 1732   O2SAT 100.0 05/19/2020 1732     Coagulation Profile: Recent Labs  Lab 05/29/20 0055 05/30/20 0317  INR 1.1 1.1    Cardiac Enzymes: No results for input(s): CKTOTAL, CKMB, CKMBINDEX, TROPONINI in the last 168 hours.  HbA1C: Hgb A1c MFr Bld  Date/Time Value Ref Range Status  05/20/2020 09:58 AM 5.6 4.8 - 5.6 % Final    Comment:    (NOTE) Pre diabetes:          5.7%-6.4%  Diabetes:              >6.4%  Glycemic control for   <7.0% adults with diabetes     CBG: No results for input(s): GLUCAP in the last 168 hours.  lcare time 35 mins. This represents my time independent of the NPs time taking care of the pt. This is excluding procedures.    05/22/2020 DO [facility] 05/30/2020, 12:01 PM

## 2020-05-30 NOTE — Progress Notes (Signed)
Assisted tele visit to patient with mother.  Marti Acebo Samson, RN  

## 2020-05-31 ENCOUNTER — Inpatient Hospital Stay (HOSPITAL_COMMUNITY): Payer: BC Managed Care – PPO

## 2020-05-31 LAB — BASIC METABOLIC PANEL
Anion gap: 11 (ref 5–15)
BUN: 9 mg/dL (ref 6–20)
CO2: 19 mmol/L — ABNORMAL LOW (ref 22–32)
Calcium: 8.4 mg/dL — ABNORMAL LOW (ref 8.9–10.3)
Chloride: 99 mmol/L (ref 98–111)
Creatinine, Ser: 0.51 mg/dL (ref 0.44–1.00)
GFR calc Af Amer: >60 mL/min (ref >60)
GFR calc non Af Amer: >60 mL/min (ref >60)
Glucose, Bld: 102 mg/dL — ABNORMAL HIGH (ref 70–99)
Potassium: 3.7 mmol/L (ref 3.5–5.1)
Sodium: 129 mmol/L — ABNORMAL LOW (ref 135–145)

## 2020-05-31 LAB — CBC
HCT: 30.5 % — ABNORMAL LOW (ref 36.0–46.0)
Hemoglobin: 9.8 g/dL — ABNORMAL LOW (ref 12.0–15.0)
MCH: 28.2 pg (ref 26.0–34.0)
MCHC: 32.1 g/dL (ref 30.0–36.0)
MCV: 87.6 fL (ref 80.0–100.0)
Platelets: 412 10*3/uL — ABNORMAL HIGH (ref 150–400)
RBC: 3.48 MIL/uL — ABNORMAL LOW (ref 3.87–5.11)
RDW: 12.1 % (ref 11.5–15.5)
WBC: 10.5 10*3/uL (ref 4.0–10.5)
nRBC: 0 % (ref 0.0–0.2)

## 2020-05-31 LAB — URINALYSIS, ROUTINE W REFLEX MICROSCOPIC
Bilirubin Urine: NEGATIVE
Glucose, UA: NEGATIVE mg/dL
Hgb urine dipstick: NEGATIVE
Ketones, ur: 5 mg/dL — AB
Nitrite: NEGATIVE
Protein, ur: 30 mg/dL — AB
Specific Gravity, Urine: 1.039 — ABNORMAL HIGH (ref 1.005–1.030)
pH: 5 (ref 5.0–8.0)

## 2020-05-31 LAB — OSMOLALITY, URINE: Osmolality, Ur: 1089 mOsm/kg — ABNORMAL HIGH (ref 300–900)

## 2020-05-31 LAB — C-REACTIVE PROTEIN: CRP: 17.3 mg/dL — ABNORMAL HIGH (ref <1.0)

## 2020-05-31 LAB — OSMOLALITY: Osmolality: 274 mOsm/kg — ABNORMAL LOW (ref 275–295)

## 2020-05-31 LAB — PROTIME-INR
INR: 1.1 (ref 0.8–1.2)
Prothrombin Time: 13.7 seconds (ref 11.4–15.2)

## 2020-05-31 LAB — GLUCOSE, CAPILLARY
Glucose-Capillary: 101 mg/dL — ABNORMAL HIGH (ref 70–99)
Glucose-Capillary: 100 mg/dL — ABNORMAL HIGH (ref 70–99)
Glucose-Capillary: 111 mg/dL — ABNORMAL HIGH (ref 70–99)

## 2020-05-31 LAB — PROCALCITONIN: Procalcitonin: 0.19 ng/mL

## 2020-05-31 LAB — MAGNESIUM: Magnesium: 2 mg/dL (ref 1.7–2.4)

## 2020-05-31 LAB — CREATININE, URINE, RANDOM: Creatinine, Urine: 209.12 mg/dL

## 2020-05-31 LAB — URIC ACID: Uric Acid, Serum: 2.8 mg/dL (ref 2.5–7.1)

## 2020-05-31 LAB — HEPARIN LEVEL (UNFRACTIONATED): Heparin Unfractionated: 0.41 IU/mL (ref 0.30–0.70)

## 2020-05-31 LAB — BRAIN NATRIURETIC PEPTIDE: B Natriuretic Peptide: 33.8 pg/mL (ref 0.0–100.0)

## 2020-05-31 LAB — SODIUM, URINE, RANDOM: Sodium, Ur: 64 mmol/L

## 2020-05-31 LAB — D-DIMER, QUANTITATIVE: D-Dimer, Quant: 6.37 ug/mL-FEU — ABNORMAL HIGH (ref 0.00–0.50)

## 2020-05-31 MED ORDER — FUROSEMIDE 10 MG/ML IJ SOLN
10.0000 mg | Freq: Once | INTRAMUSCULAR | Status: AC
  Administered 2020-05-31: 10 mg via INTRAVENOUS
  Filled 2020-05-31: qty 2

## 2020-05-31 MED ORDER — SODIUM CHLORIDE 0.9 % IV SOLN
1.0000 g | INTRAVENOUS | Status: AC
  Administered 2020-05-31 – 2020-06-02 (×3): 1 g via INTRAVENOUS
  Filled 2020-05-31 (×2): qty 10
  Filled 2020-05-31: qty 1

## 2020-05-31 NOTE — Progress Notes (Signed)
Spoke with patients mother, update given. Will continue to monitor.

## 2020-05-31 NOTE — Progress Notes (Signed)
Physical Therapy Treatment Patient Details Name: Gail Montgomery MRN: [id number] DOB: Feb 13, 1996 Today's Date: 05/31/2020    History of Present Illness 24 yo who had acute R sided weakness and aphasia 03/27/46 due to LVO at LICA. Screened Covid + but asymptomatic; tPA and underwent thrombectomy; during procedure had ICA vasospasm vs dissection.  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 Pt self extubated 05/20/20.     PT Comments    Pt make steady though slower progress toward goals.  Each time therapies arrive, pt's R knee is found in significant flexion.  Emphasis on repositioning and stretching quads and heel cord, warm up exercise, then transition to EOB, work on sitting balance and scoot with w/shifts, sit to stands with stress on full contact w/bearing on the R with knee extension before transfer to the chair.    Follow Up Recommendations  CIR     Equipment Recommendations  Wheelchair (measurements PT);Wheelchair cushion (measurements PT);Hospital bed;3in1 (PT)    Recommendations for Other Services       Precautions / Restrictions Precautions Precautions: Fall Precaution Comments: right sided weakness (R leg most significantly)    Mobility  Bed Mobility Overal bed mobility: Needs Assistance Bed Mobility: Supine to Sit     Supine to sit: Mod assist;+2 for safety/equipment     General bed mobility comments: up via R elbow with truncal assist, TC for LE direction off the bed.  mac cues for scooting mod assist, some with pad.  Transfers Overall transfer level: Needs assistance   Transfers: Sit to/from Stand;Stand Pivot Transfers Sit to Stand: Mod assist;Max assist;+2 physical assistance Stand pivot transfers: Max assist;+2 safety/equipment;Mod assist       General transfer comment: cues for hand placement, face to face assist to come forward  and stand and 1 additional person to work on R knee ext/heel on floor and increased w/bearing through heel/foot..  Face to face pivot assist to chair with max assist to advance the R LE.  Ambulation/Gait                 Stairs             Wheelchair Mobility    Modified Rankin (Stroke Patients Only) Modified Rankin (Stroke Patients Only) Pre-Morbid Rankin Score: No symptoms Modified Rankin: Severe disability     Balance Overall balance assessment: Needs assistance Sitting-balance support: Single extremity supported;No upper extremity supported Sitting balance-Leahy Scale: Fair Sitting balance - Comments: pt able to sit without UE assist, work on w/shift and pivoting around each opposite arm  Postural control: Posterior lean;Right lateral lean Standing balance support: Bilateral upper extremity supported Standing balance-Leahy Scale: Poor Standing balance comment: stood x3, 2 trials working on w/bearing on R LE in knee extension with heel on the floor.  W/shifting to/from R side, with pt able to keep R heel on the floor, but minor knee extension lag.                            Cognition Arousal/Alertness: Awake/alert Behavior During Therapy: WFL for tasks assessed/performed Overall Cognitive Status: Difficult to assess Area of Impairment: Following commands;Attention;Safety/judgement;Awareness;Problem solving                   Current Attention Level: Sustained   Following Commands: Follows one step commands with increased time Safety/Judgement: Decreased awareness of safety;Decreased awareness of deficits Awareness:  Intellectual Problem Solving: Slow processing;Requires tactile cues        Exercises Other Exercises Other Exercises: prolonged stretch into knee extension with heel coord stretch for over 30 sec.  Pt whimpering in pain.    General Comments        Pertinent Vitals/Pain Faces Pain Scale: Hurts even more Pain Location:  grimace with R LE knee ext/add/IR  pt points to area over IT band Pain Descriptors / Indicators: Grimacing (tearful) Pain Intervention(s): Monitored during session;Repositioned    Home Living                      Prior Function            PT Goals (current goals can now be found in the care plan section) Acute Rehab PT Goals Patient Stated Goal: mother would like her to return home with mom at d/c PT Goal Formulation: With family Time For Goal Achievement: 06/04/20 Potential to Achieve Goals: Good Progress towards PT goals: Progressing toward goals    Frequency    Min 4X/week      PT Plan Current plan remains appropriate    Co-evaluation              AM-PAC PT "6 Clicks" Mobility   Outcome Measure  Help needed turning from your back to your side while in a flat bed without using bedrails?: A Lot Help needed moving from lying on your back to sitting on the side of a flat bed without using bedrails?: A Lot Help needed moving to and from a bed to a chair (including a wheelchair)?: Total Help needed standing up from a chair using your arms (e.g., wheelchair or bedside chair)?: Total Help needed to walk in hospital room?: Total Help needed climbing 3-5 steps with a railing? : Total 6 Click Score: 8    End of Session   Activity Tolerance: Patient tolerated treatment well;Patient limited by pain Patient left: in chair;with call bell/phone within reach;with chair alarm set Nurse Communication: Mobility status PT Visit Diagnosis: Muscle weakness (generalized) (M62.81);Difficulty in walking, not elsewhere classified (R26.2);Other abnormalities of gait and mobility (R26.89) Hemiplegia - Right/Left: Right Hemiplegia - dominant/non-dominant: Dominant Hemiplegia - caused by: Cerebral infarction;Nontraumatic intracerebral hemorrhage     Time: 1150-1220 PT Time Calculation (min) (ACUTE ONLY): 30 min  Charges:  $Therapeutic Activity: 8-22 mins $Neuromuscular  Re-education: 8-22 mins                     05/31/2020  Gail Montgomery., PT Acute Rehabilitation Services [phone]  (pager) [phone]  (office)   [name] 05/31/2020, 6:57 PM

## 2020-05-31 NOTE — Progress Notes (Signed)
  Speech Language Pathology Treatment: Cognitive-Linquistic  Patient Details Name: Gail Montgomery MRN: [id number] DOB: 02-08-1996 Today's Date: 05/31/2020 Time: 6294-7654 SLP Time Calculation (min) (ACUTE ONLY): 32 min  Assessment / Plan / Recommendation Clinical Impression  Pt on televisit with family upon entering room.  Followed simple, one-step commands with max multimodal cues today. She fed herself sherbet with improved oral efficiency, no cues needed. Pt used some generalized gesture and expressing discomfort with Purewick which had displaced.  Pt cleaned, gown replaced.  No spontaneous verbalizations noted today nor could be elicited despite max cues.  Pt using facial expression and answered yes/no questions re: preferences with subtle head shake no and more obvious yes head nod on 5/5 opportunities.  Will continue to follow for aphasia, swallowing.     HPI HPI: Pt is a 24 yo female presenting with sudden onset R sided weakness and aphasia. 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 She was intubated for thrombectomy 8/21. 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 MRI showed early subacute infarct of the left basal ganglia involving the posterior putamen and caudate body. Pt self-extubated 8/22. Pt tested (+) for COVID-19 but was asymptomatic upon arrival. 8/30 DVT. PMH: panic attack, anxiety      SLP Plan  Continue with current plan of care       Recommendations                   Oral Care Recommendations: Oral care BID Follow up Recommendations: Inpatient Rehab SLP Visit Diagnosis: Aphasia (R47.01) Plan: Continue with current plan of care       GO                Blenda Mounts Laurice 05/31/2020, 2:22 PM  Raenette Sakata L. Samson Frederic, MA CCC/SLP Acute Rehabilitation Services Office number [phone] Pager [phone]

## 2020-05-31 NOTE — Progress Notes (Signed)
ANTICOAGULATION CONSULT NOTE   Pharmacy Consult for Heparin Indication: 8/30 BL segmental PE   Allergies  Allergen Reactions  . 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    Patient Measurements: Height: 5\' 4"  (162.6 cm) Weight: 85.1 kg (187 lb 9.8 oz) IBW/kg (Calculated) : 54.7 Heparin Dosing Weight: 73kg  Vital Signs: Temp: 100 F (37.8 C) (09/02 0750) Temp Source: Rectal (09/02 0628) BP: 106/72 (09/02 0700) Pulse Rate: 101 (09/02 0700)  Labs: Recent Labs    05/29/20 0055 05/29/20 1102 05/30/20 0317 05/30/20 0317 05/30/20 1235 05/30/20 2038 05/31/20 0450 05/31/20 0828  HGB 9.4*  --  10.4*  --   --   --  9.8*  --   HCT 28.4*  --  32.7*  --   --   --  30.5*  --   PLT 242  --  187  --   --   --  412*  --   LABPROT 13.7  --  13.6  --   --   --  13.7  --   INR 1.1  --  1.1  --   --   --  1.1  --   HEPARINUNFRC <0.10*   < > <0.10*   < > 0.25* 0.37 0.41  --   CREATININE 0.57  --  0.57  --   --   --   --  0.51   < > = values in this interval not displayed.    Estimated Creatinine Clearance: 116.5 mL/min (by C-G formula based on SCr of 0.51 mg/dL).  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 Also, 8/30 LE doppler consistent with acute DVT in RLE. Patient with persistent intracranial hemorrhage per 8/26 CT head. Pharmacy consulted to start heparin no bolus. Will target low end of goal as much as possible.  -heparin level= 0.41 on 1450 units/hr, therapeutic x2 levels now. CBC stable.    Goal of Therapy:  Heparin level 0.3- 0.5 units/ml Monitor platelets by anticoagulation protocol: Yes   Plan:  Continue heparin at 1450 units/hr Monitor daily CBC and heparin level Monitor for signs/symptoms of bleeding Plan to transition to Eliquis once in therapeutic range for  at least 48 hours.   9/26, PharmD, BCPS, BCCCP Clinical Pharmacist Please refer to [facility] for [facility] Pharmacy numbers 05/31/2020, 9:37 AM

## 2020-05-31 NOTE — Progress Notes (Signed)
STROKE TEAM PROGRESS NOTE   INTERVAL HISTORY Patient remains neurologically unchanged.  Remains nonverbal but interacting with facial expressions, and following midline and one-step commands quite well consistently.  No events overnight, she has been started on IV heparin with level 0. 4 1 this am. She is less tachycardic today discussed plan of care with Dr. Thedore Mins.  Plan to transfer to floor bed today if available.  Hopefully transfer to inpatient rehab next few days once she is off isolation.  Urine testing today shows UTI and she has been started on ceftriaxone  OBJECTIVE Vitals:   05/31/20 0900 05/31/20 1000 05/31/20 1100 05/31/20 1117  BP: 107/77 120/80 124/76   Pulse: 97 91 98   Resp: 18 17 17    Temp:    100.2 F (37.9 C)  TempSrc:    Rectal  SpO2: 100% 98% 100%   Weight:      Height:       CBC:  Recent Labs  Lab 05/30/20 0317 05/31/20 0450  WBC 13.7* 10.5  HGB 10.4* 9.8*  HCT 32.7* 30.5*  MCV 89.1 87.6  PLT 187 412*   Basic Metabolic Panel:  Recent Labs  Lab 05/29/20 0055 05/29/20 0055 05/30/20 0317 05/31/20 0828  NA 130*   < > 130* 129*  K 3.8   < > 4.1 3.7  CL 98   < > 98 99  CO2 21*   < > 16* 19*  GLUCOSE 104*   < > 94 102*  BUN 9   < > 13 9  CREATININE 0.57   < > 0.57 0.51  CALCIUM 8.4*   < > 8.5* 8.4*  MG 2.1  --   --  2.0  PHOS 3.8  --   --   --    < > = values in this interval not displayed.   Lipid Panel:     Component Value Date/Time   CHOL 156 05/20/2020 0958   TRIG 105 05/20/2020 0958   TRIG 104 05/20/2020 0958   HDL 57 05/20/2020 0958   CHOLHDL 2.7 05/20/2020 0958   VLDL 21 05/20/2020 0958   LDLCALC 78 05/20/2020 0958   HgbA1c:  Lab Results  Component Value Date   HGBA1C 5.6 05/20/2020   Urine Drug Screen:     Component Value Date/Time   LABOPIA NONE DETECTED 05/20/2020 1736   COCAINSCRNUR NONE DETECTED 05/20/2020 1736   LABBENZ NONE DETECTED 05/20/2020 1736   AMPHETMU NONE DETECTED 05/20/2020 1736   THCU POSITIVE (A)  05/20/2020 1736   LABBARB NONE DETECTED 05/20/2020 1736    Alcohol Level No results found for: [facility]  IMAGING  CT HEAD CODE STROKE WO CONTRAST 05/19/2020 Dense left ICA terminus and MCA. No acute hemorrhage. ASPECTS is 10.   CT Code Stroke CTA Head W/WO contrast CT Code Stroke CTA Neck W/WO contrast 05/19/2020 1. 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  2. Mildly indistinct appearance of the proximal left ICA is likely from motion artifact, consider cervical run. No atheromatous changes or vasculopathy seen in the neck.   Neuro Interventional Radiology - Cerebral Angiogram with Intervention - Dr 05/21/2020 05/19/20 2:27 PM Left MCA: Baseline TICI 0 First Pass Device:                 Local aspiration and solitaire 4 x 40              Result  TICI 0 Second Pass Device:           Local aspiration and solitaire 4 x 40              Result                         TICI 3 Left ACA/pericallosal  Baseline TICI 0,    Final TICI 0 pericallosal artery Findings:  Patent right CFA Left M1 occlusion, final is TICI 3  Left ACA A2 occlusion migrated to pericallosal artery during the case.  Could not complete a first pass safely.  Significant irregularity of the cervical ICA, concerning for dissection, that resolved after observation, and once DAPT was initiated.  The final appearance may represent spasm vs FMD/vasculitis  CT HEAD WO CONTRAST 05/19/2020 New large volume of subarachnoid and intraventricular hemorrhage.  CT HEAD WO CONTRAST 05/20/2020 1. Subarachnoid hemorrhage along the anterior interhemispheric fissure with slightly increased extension into both lateral ventricles.  2. Slight increase in size of the temporal horn of the left lateral ventricle consistent with early communicating hydrocephalus.   MR BRAIN WO CONTRAST 05/20/2020 1. Early subacute infarct of the left basal ganglia involving the posterior putamen and caudate body.   2. Subarachnoid hemorrhage along the interhemispheric fissure extending into the lateral ventricles.  3. 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   CT HEAD WO CONTRAST 05/21/2020 1. Stable appearance of left basal ganglia nonhemorrhagic infarction. 2. Stable medial left frontal extra-axial hemorrhage, extending into the ventricles bilaterally. 3. Subarachnoid hemorrhage posteriorly on the left and along the inter cerebral hemisphere likely represents redistribution without significant new hemorrhage. 4. Hemorrhage is present within the aqueduct of Sylvius. 5. Progressive dilation of the ventricles compatible with developing hydrocephalus. 6. No new areas of hemorrhage.   CT HEAD WO CONTRAST 05/22/2020 1. Regressed intraventricular hemorrhage since yesterday with small volume residual. Stable mild ventriculomegaly, and possible mild transependymal edema. 2. Stable left inferior frontal gyrus parasagittal hematoma (approximately 12 mL) and regional edema. No significant midline shift, basilar cisterns are patent. 3. Stable left basal ganglia infarcts. 4. No new intracranial abnormality.   CT HEAD WO CONTRAST 05/24/20 IMPRESSION: 1. 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 2. Unchanged intracranial hemorrhage with mild lateral ventriculomegaly.  ECHOCARDIOGRAM COMPLETE 05/20/2020 1. Left ventricular ejection fraction, by estimation, is 60 to 65%. The left ventricle has normal function. The left ventricle has no regional wall motion abnormalities. Left ventricular diastolic parameters were normal.   2. Right ventricular systolic function is normal. The right ventricular size is normal.   3. The mitral valve is normal in structure. Trivial mitral valve regurgitation. No evidence of mitral stenosis.   4. The aortic valve is normal in structure. Aortic valve regurgitation is not visualized. No aortic stenosis is present.   5. The inferior  vena cava is normal in size with greater than 50% respiratory variability, suggesting right atrial pressure of 3 mmHg.   Bilateral Lower Extremity Venous Dopplers  05/21/2020 RIGHT: - There is no evidence of deep vein thrombosis in the lower extremity. However, portions of this examination were limited- see technologist comments above.  - No cystic structure found in the popliteal fossa.   LEFT: - There is no evidence of deep vein thrombosis in the lower extremity. However, portions of this examination were limited- see technologist comments above.  - No cystic structure found in the popliteal fossa.  Transcranial Doppler w/ Bubble 05/25/2020 Not cooperative for Valsalva.  Bubble study showed Spencer degree 3 at rest.  Recommend TEE for further confirmation.  Consider TEE after Covid isolation and before CIR discharge CT head 05/29/2020 :Interval decrease in the size/conspicuity of intraparenchymal, extra-axial, and intraventricular hemorrhage, as above. No new acute hemorrhage. PHYSICAL EXAM  Temp:  [99.2 F (37.3 C)-102.9 F (39.4 C)] 100.2 F (37.9 C) (09/02 1117) Pulse Rate:  [91-147] 98 (09/02 1100) Resp:  [15-37] 17 (09/02 1100) BP: (95-157)/(41-132) 124/76 (09/02 1100) SpO2:  [89 %-100 %] 100 % (09/02 1100)  General -mildly obese young African-American lady Ophthalmologic - fundi not visualized due to noncooperation.   Cardiovascular - regular rhythm and rate  Neurological exam : Awake alert with flat affect, eyes open, nonverbal not answering questions, she will raise eyebrows and shrug.  Able to follow simple commands on both hands and left foot. Able to have bilateral gaze with voice on both sides, however right gaze incomplete, still has left gaze preference. PERRL. Blinking to visual threat bilaterally. Right facial droop. Tongue protrusion not cooperative. LUE spontaneous movement and LLE strong withdraw to pain. RUE proximal 4/5, distal finger grip 4/5. RLE plegic with only  slight withdraw to pain. Sensation, coordination not cooperative and gait not tested.   ASSESSMENT/PLAN Ms. Gail Montgomery is a 24 y.o. female with history of tobacco use, anxiety (panic attacks) and hormonal birth control presented with right-sided weakness and aphasia that started abruptly at 9:30 AM.  COVID - positive. IV t-PA - Saturday 05/19/20 at 1145. Thrombectomy - Dr Loreta [address] - Left M1 occlusion, final is TICI 3.   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 No acute hemorrhage. ASPECTS is 10.      CTA H&N - Emergent large vessel occlusion at the left ICA terminus continuing into the MCA. Mildly indistinct appearance of the proximal left ICA is likely from motion artifact, consider cervical run. No atheromatous changes or vasculopathy seen in the neck.  IR - left MCA and A2 occlusion s/p TICI3 of MCA and TICI0 of ACA. Initially left ICA stenosis post IR but resolved on the final run  CT head 8/21 - New large volume of subarachnoid and intraventricular hemorrhage.   CT head 8/22 [facility] along the anterior interhemispheric fissure with slightly increased extension into both lateral ventricles. Slight increase in size of the temporal horn of the left lateral ventricle consistent with early communicating hydrocephalus.   MRI head 8/22 -  Early subacute infarct of the left basal ganglia involving the posterior putamen and caudate body.  CT head 8/23 - stable L basal ganglia infarct, ICH/IVH/SAH. Progressive dilation of ventricles. CT head 8/24 - regressed IVH. Stable infarct and frontal ICH. Stable ventriculomegaly CT head 8/26 - stable infarcts & hemorrhage w/ mild ventriculomegaly  LE Venous Dopplers - no DVT  2D  Echo EF 60-65%  TCD bubble study Spencer degree 3 at rest.  Not cooperative for Valsalva.  Sars Corona Virus 2 - positive  LDL - 78  HgbA1c 5.6  Hypercoagulable and autoimmune labs ANCA proteinase 3 ab 6.0 (h) ANA positive - C - ANCA titers < 1:20 (  neg)  ;  P - ANCA titers < 1:20 (neg)  UDS - THC positive  VTE prophylaxis - SCDs  No antithrombotic prior to admission, now on No [facility]otic [facility]  Ongoing aggressive stroke risk factor management  Therapy recommendations:  CIR   Disposition:  Pending   Hydrocephalus, mild  CT head showed mildly increased size of left temporal horn   CT repeat stable hemorrhage but mild progression of hydrocephalus  CT head 8/24 - regressed IVH. Stable infarct and frontal ICH. Stable mild ventriculomegaly  NSG on board  3% hypertonic saline - 50 cc/hr ->25cc->10cc -> off  Na - 139->149->154->148->152->150->140->139->138->138->133  PICC placed 8/23   Repeat CT on 8/26 stable  PFO  TCD bubble study showed Spencer degree 3 at rest.  Not cooperative with Valsalva  Consider TEE after Covid isolation and before CIR discharge  If PFO confirmed, consider to refer to Dr. Excell Seltzerooper cardiology for PFO closure.  BP management  Stable  [facility] discussed with Dr. Conchita ParisNundkumar, will keep nimodipine while inpt - d/c on discharge if neuro stable  SBP goal < 160 given SAH and IVH (SBP 99 - 125 this AM) . Long-term BP goal normotensive  Hyperlipidemia  Home Lipid lowering medication: none   LDL 78, goal < 70  [facility] stat[facility]  Consider low dose statin at discharge  ? Hypercoagulable state   COVID positive - asymptomatic  D-dimer > 20->16.13->17.44-> >20-> >20   OCP user with smoking and THC use  Hypercoagulable work up (so far) - ANCS proteinase 3 - 6.0 (H) (0.0 - 3.5)   - C - ANCA titers < 1:20 (neg)  ;  P - ANCA titers < 1:20 (neg)  Myeloperoxidase Abs - < 9.0 (WNL)  COVID  - 19 infection  COVID + test 8/21  Asymptomatic  Ferritin 24->30->37->51->93->92  Fibrinogen 289  CRP 0.8->2.4->3.3->3.9->8.7->9.1   D-dimer > 20->16.13->17.44-> >20-> >20 -> >20   COVID Ab titer > 2500  Per ID, ok to be off isolation in 10 days following + test as asymptomatic (05/29/2020)  Leukocytosis, Fever  Etiology unknown-UTI diagnosed on 05/31/2020  TMax 101.2->103.1->100.1->102.3->99.1->103->102->101.4  WBC 10.9->12.2->12.5->11.0->14.0->13.9->17.0  UA small LE, rare bact, 0-5 WBC - neg  CXR neg  Urine culture - 8/25 - no growth  Blood Cx - 8/25 - NG x 3 days - final pending  On vanco and zosyn empiric treatment 8/25>>discontinued 8/27 - now off all abxs Ceftriaxone started 05/31/2020 Tachycardia  HR 110-120s  Likely due to fever  Continue monitoring  Euthyroid sick syndrome  Low TSH 0.054   normal free T4 and T3  Likely related to critical illness  Tobacco abuse  Intermittent smoking per mom  Smoking cessation counseling will be provided  Other Stroke Risk Factors  ETOH use, advised to drink no more than 1 alcoholic beverage per day  Obesity, Body mass index is 32.2 kg/m., recommend weight loss, diet and exercise as appropriate   Hormonal birth control - both progesterone and estrogen - Estarylla  Other Active Problems  Code status - Full code  Mild anemia 11.2->10.4->11.6->11.4->10.6->10.1->10.3  Hypokalemia 3.3->4.4->3.2 ->3.4 ->3.1->3.5 - supplement - 3.8->3.8  Low normal B12 level 300 - supplement  Thrombocytopenia - 209->137->129->148  PICC line placed 8/23  Hospital day # 12   Patient is neurologically unchanged and remains significantly aphasic with right lower extremity plegic.  Her tachycardia and fever appear to be improving and with the  diagnosis of UTI today and starting ceftriaxone hopefully it will improve further.  Continue IV heparin drip for now and will change to Eliquis upon discharge to rehab.  Transfer to  floor bed when available and hopefully DC Covid isolation precautions soon.  Discussed with Dr. Thedore Mins.  I had a long discussion over the phone with the patient's mother yesterday on 05/30/2020 regarding her difficult and delicate situation and the need to anticoagulate despite recent intracerebral hemorrhage which hopefully was a one-time event caused by the TPA and the mechanical thrombectomy procedure. Mother continues to have concerns about pt`s care particularly on her being moved in and out of ICU and floor beds and plans to discuss this with office of patient experience. Recommend continue IV heparin stroke protocol with close monitoring by pharmacy to keep heparin level in the lower therapeutic range.  Hopefully transition to Eliquis when she gets transferred to rehab.    This patient is critically ill and at significant risk of neurological worsening, death and care requires constant monitoring of vital signs, hemodynamics,respiratory and cardiac monitoring, extensive review of multiple databases, frequent neurological assessment, discussion with family, other specialists and medical decision making of high complexity.I have made any additions or clarifications directly to the above note.This critical care time does not reflect procedure time, or teaching time or supervisory time of PA/NP/Med Resident etc but could involve care discussion time.  I spent 30 minutes of neurocritical care time  in the care of  this patient.  Delia Heady     To contact Stroke Continuity provider, please refer to [email or web address]. After hours, contact General Neurology

## 2020-05-31 NOTE — Progress Notes (Signed)
Spoke with Arby Barrette, Infection Prevention Specialist. Per [facility] policy, ok to d/c airborne precaution when patient is transferred from ICU.

## 2020-05-31 NOTE — Progress Notes (Signed)
PROGRESS NOTE                                                                                                                                                                                                             Patient Demographics:    Gail Montgomery, is a 24 y.o. female, DOB - Feb 07, 1996, WUJ:[id number]  Outpatient Primary MD for the patient is No primary care provider on file.    LOS - 12  Admit date - 05/19/2020         Brief Narrative - 24 year old female normal upon waking up this morning 8/21 2021. At 09:30 she had sudden onset of right hemiparesis, and aphasia. She presents 8/21/2021to the [facility] ED as a Code Stroke  with acute left MCA syndrome.She was screened as Covid +, but was asymptomatic. She received tPA at 11:29 am , she was deemed a  candidate for thrombectomy, and was taken to IR, in IR she had successful  opening of her LMCA with reperfusion, but LACA has a residual small distal anterior cerebral clot. During procedure there was  ICA vasospasm vs dissection. She was loaded with ASA and Plavix. There was concern that she would need carotid  stenting . However upon re-imaging the vessel was open, and she did not require stent. SAH, Parenchymal hemorrhage, and intraventricular hemorrhage.   She has had persistent, intermittent fevers since 8/24. Also has had sinus tachycardia, from 110s to 130s, occasionally 140s 150s.  Tachy seems to correlate with the severity of fever mostly, she was breifly given Vanc and Zosyn in ICU on 8/25- 8/27.  Abx were stopped, she was then found to have DVT-PE and started on Hep gtt, transferred to my service on 05/31/20 on 12 of her Hospital stay. 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    Subjective:    [name] today remians non verbal, but in no distress, nods her head to Qs, denies headache.   Assessment  & Plan :     1. Incidental Covid  +ve status with no Pulmonary symptoms - fully vaccinated with Pfizer, monitor.repeat Covid test on 05/31/2020.  Trying to remove isolation as soon as possible.  Recent Labs  Lab 05/26/20 9562 05/26/20 1300 05/27/20 1308 05/28/20 0151 05/28/20 6578 05/28/20 4696 05/28/20 2952 05/28/20 1225 05/29/20 8413  05/30/20 0317 05/31/20 0450 05/31/20 0828  WBC   < >  --  17.0* 15.7*  --   --   --   --  15.0* 13.7* 10.5  --   PLT   < >  --  148* 190  --   --   --   --  242 187 412*  --   CRP  --   --   --   --   --   --  28.8*  --   --   --   --  17.3*  BNP  --   --   --   --   --   --   --   --   --   --   --  33.8  DDIMER  --   --   --   --   --   --  12.89*  --   --   --   --  6.37*  PROCALCITON  --  <0.10  --   --   --   --   --  0.25 0.32 0.41  --  0.19  AST  --   --   --   --   --  71*  --   --  66* 82*  --   --   ALT  --   --   --   --   --  56*  --   --  57* 72*  --   --   ALKPHOS  --   --   --   --   --  44  --   --  43 53  --   --   BILITOT  --   --   --   --   --  0.3  --   --  0.2* 0.7  --   --   ALBUMIN  --   --   --   --   --  2.4*  --   --  2.0* 2.3*  --   --   INR  --   --   --   --   --   --   --   --  1.1 1.1 1.1  --   LATICACIDVEN  --   --   --   --  0.8  --   --   --   --   --   --   --    < > = values in this interval not displayed.    2. 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 She has had full stroke work-up and neurology is following, transcranial Doppler with bubble study suggestive of possible PFO.  She also had work-up consistent with PE and DVT, she likely has paradoxical embolic infarction in the setting of DVT PE with PFO.  Currently on nimodipine with goal SBP of under 160, LDL greater than 78 but statin held due to subarachnoid hemorrhage by neurology,  statin  per Neuro.   3.  PFO.  Once she is out of her Covid quarantine will consult cardiology for TEE and possible PFO closure.  4.  Right lower extremity acute DVT and bilateral acute PE.  Due to combination of hormonal contraception, obesity, some exposure  to cigarette smoking, long flight to [facility] prior to her sickness.  Anticoagulation with heparin thereafter at least 9 months of anticoagulation with outpatient hematology follow-up for hypercoagulable state work-up if needed.  5.  Persistent low-grade fevers.  Could be due to blood clots and also now evidence of E. coli UTI on 05/31/2020.  3 days of Rocephin, anticoagulation and monitor.  If all negative then possibly central fevers.  6.  Intermittent smoking will counsel prior to discharge.  7.  Obesity BMI of 32 upon admission.  Follow-up with PCP.  8.  Sick euthyroid syndrome.  Repeat TSH in 6  weeks.   9.  Hyponatremia.  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  Stop further IV fluids, gentle Lasix on 05/31/2020 and monitor.  Appropriate osmolality and electrolyte studies have been ordered.    Condition - Extremely Guarded  Family Communication  :  Mother Darlina Sicilian [phone] - 05/31/20 in detail.  Code Status :  Full  Consults  :  PCCM, neurology  Procedures  :    CTA - 1. Bilateral segmental LOWER lobe pulmonary emboli, LEFT-greater-than-RIGHT. Equivocal segmental emboli within the LEFT UPPER lobe. 2. Mild cardiomegaly.   CT HEAD CODE STROKE WO CONTRAST 05/19/2020 Dense left ICA terminus and MCA. No acute hemorrhage. ASPECTS is 10.   CT Code Stroke CTA Head W/WO contrast CT Code Stroke CTA Neck W/WO contrast 05/19/2020 1. 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  2. Mildly indistinct appearance of the proximal left ICA is likely from motion artifact, consider cervical run. No atheromatous changes or vasculopathy seen in the neck.   Neuro Interventional Radiology - Cerebral Angiogram with  Intervention - Dr Loreta Ave 05/19/20 2:27 PM Left MCA: Baseline TICI 0 First Pass Device:Local aspiration and solitaire 4 x 40  ResultTICI 0 SecondPass Device:Local aspiration and solitaire 4 x 40  ResultTICI 3 Left ACA/pericallosal  Baseline TICI 0,    Final TICI 0 pericallosal artery Findings:Patent right CFA Left M1 occlusion, final is TICI 3  Left ACA A2 occlusion migrated to pericallosal artery during the case. Could not complete a first pass safely.  Significant irregularity of the cervical ICA, concerning for dissection, that resolved after observation, and once DAPT was initiated. The final appearance may represent spasm vs FMD/vasculitis  CT HEAD WO CONTRAST 05/19/2020 New large volume of subarachnoid and intraventricular hemorrhage.  CT HEAD WO CONTRAST 05/20/2020 1. Subarachnoid hemorrhage along the anterior interhemispheric fissure with slightly increased extension into both lateral ventricles.  2. Slight increase in size of the temporal horn of the left lateral ventricle consistent with early communicating hydrocephalus.   MR BRAIN WO CONTRAST 05/20/2020 1. Early subacute infarct of the left basal ganglia involving the posterior putamen and caudate body.  2. Subarachnoid hemorrhage along the interhemispheric fissure extending into the lateral ventricles.  3. 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   CT HEAD WO CONTRAST 05/21/2020 1. Stable appearance of left basal ganglia nonhemorrhagic infarction. 2. Stable medial left frontal extra-axial hemorrhage, extending into the ventricles bilaterally. 3. Subarachnoid hemorrhage posteriorly on the left and along the inter cerebral hemisphere likely represents redistribution without significant new hemorrhage. 4. Hemorrhage is present within the aqueduct of Sylvius. 5.  Progressive dilation of the ventricles compatible with developing hydrocephalus. 6. No new areas of hemorrhage.   CT HEAD WO CONTRAST 05/22/2020 1. Regressed intraventricular hemorrhage since yesterday with small volume residual. Stable mild ventriculomegaly, and possible mild transependymal edema.  2. Stable left inferior frontal gyrus parasagittal hematoma (approximately 12 mL) and regional edema. No significant midline shift, basilar cisterns are patent. 3. Stable left basal ganglia infarcts. 4. No new intracranial abnormality.   CT HEAD WO CONTRAST 05/24/20 IMPRESSION: 1. 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 2. Unchanged intracranial hemorrhage with mild lateral ventriculomegaly.  ECHOCARDIOGRAM COMPLETE 05/20/2020 1. Left ventricular ejection fraction, by estimation, is 60 to 65%. The left ventricle has normal function. The left ventricle has no regional wall motion abnormalities. Left ventricular diastolic parameters were normal.   2. Right ventricular systolic function is normal. The right ventricular size is normal.   3. The mitral valve is normal in structure. Trivial mitral valve regurgitation. No evidence of mitral stenosis.   4. The aortic valve is normal in structure. Aortic valve regurgitation is not visualized. No aortic stenosis is present.   5. The inferior vena cava is normal in size with greater than 50% respiratory variability, suggesting right atrial pressure of 3 mmHg.   Bilateral Lower Extremity Venous Dopplers  05/21/2020 RIGHT: - There is no evidence of deep vein thrombosis in the lower extremity. However, portions of this examination were limited- see technologist comments above.  - No cystic structure found in the popliteal fossa.   LEFT: - There is no evidence of deep vein thrombosis in the lower extremity. However, portions of this examination were limited- see technologist comments above.  - No cystic structure found in the popliteal fossa.     Transcranial Doppler w/ Bubble 05/25/2020 Not cooperative for Valsalva.  Bubble study showed Spencer degree 3 at rest.  Recommend TEE for further confirmation.    PUD Prophylaxis : PPI  Disposition Plan  :    Status is: Inpatient  Remains inpatient appropriate because:IV treatments appropriate due to intensity of illness or inability to take PO   Dispo: The patient is from: Home              Anticipated d/c is to: CIR              Anticipated d/c date is: > 3 days              Patient currently is not medically stable to d/c.   DVT Prophylaxis  :    Heparin    Lab Results  Component Value Date   PLT 412 (H) 05/31/2020    Diet :  Diet Order            DIET DYS 2 Room service appropriate? Yes with Assist; Fluid consistency: Thin  Diet effective now                  Inpatient Medications  Scheduled Meds: .  stroke: mapping our early stages of recovery book   Does not apply Once  . Chlorhexidine Gluconate Cloth  6 each Topical Daily  . folic acid  1 mg Oral Daily  . furosemide  10 mg Intravenous Once  . mouth rinse  15 mL Mouth Rinse BID  . multivitamin  1 tablet Oral Daily  . niMODipine  60 mg Oral Q4H  . pantoprazole  40 mg Oral Daily  . thiamine  100 mg Oral Daily  . vitamin B-12  500 mcg Oral Daily   Continuous Infusions: . heparin 1,450 Units/hr (05/31/20 1100)   PRN Meds:.acetaminophen **OR** acetaminophen (TYLENOL) oral liquid 160 mg/5 mL **OR** acetaminophen, docusate, polyethylene glycol, sodium chloride flush  Antibiotics  :    Anti-infectives (From admission,  onward)   Start     Dose/Rate Route Frequency Ordered Stop   05/29/20 0000  vancomycin (VANCOCIN) IVPB 1000 mg/200 mL premix  Status:  Discontinued        1,000 mg 200 mL/hr over 60 Minutes Intravenous Every 8 hours 05/28/20 1543 05/28/20 1557   05/28/20 1545  ceFEPIme (MAXIPIME) 2 g in sodium chloride 0.9 % 100 mL IVPB  Status:  Discontinued        2 g 200 mL/hr over 30 Minutes  Intravenous Every 8 hours 05/28/20 1541 05/28/20 1557   05/28/20 1545  vancomycin (VANCOREADY) IVPB 1500 mg/300 mL  Status:  Discontinued        1,500 mg 150 mL/hr over 120 Minutes Intravenous  Once 05/28/20 1541 05/28/20 1606   05/23/20 2200  piperacillin-tazobactam (ZOSYN) IVPB 3.375 g  Status:  Discontinued        3.375 g 12.5 mL/hr over 240 Minutes Intravenous Every 8 hours 05/23/20 2055 05/25/20 1619   05/23/20 2100  vancomycin (VANCOCIN) IVPB 1000 mg/200 mL premix  Status:  Discontinued        1,000 mg 200 mL/hr over 60 Minutes Intravenous Every 8 hours 05/23/20 2055 05/25/20 1608       Time Spent in minutes  30   Susa Raring M.D on 05/31/2020 at 11:56 AM  To page go to [email or web address] - password [facility]  [facility] -  Office  [phone]    See all Orders from today for further details    Objective:   Vitals:   05/31/20 0900 05/31/20 1000 05/31/20 1100 05/31/20 1117  BP: 107/77 120/80 124/76   Pulse: 97 91 98   Resp: 18 17 17    Temp:    100.2 F (37.9 C)  TempSrc:    Rectal  SpO2: 100% 98% 100%   Weight:      Height:        Wt Readings from Last 3 Encounters:  05/29/20 85.1 kg  11/07/16 70.3 kg (86 %, Z= 1.08)*   * Growth percentiles are based on CDC (Girls, 2-20 Years) data.     Intake/Output Summary (Last 24 hours) at 05/31/2020 1156 Last data filed at 05/31/2020 1100 Gross per 24 hour  Intake 353.31 ml  Output --  Net 353.31 ml     Physical Exam  Awake Alert, Non verbal, R leg >> weaker than R Arm,  [place].AT,PERRAL Supple Neck,No JVD, No cervical lymphadenopathy appriciated.  Symmetrical Chest wall movement, Good air movement bilaterally, CTAB RRR,No Gallops,Rubs or new Murmurs, No Parasternal Heave +ve B.Sounds, Abd Soft, No tenderness, No organomegaly appriciated, No rebound - guarding or rigidity. No Cyanosis, Clubbing or edema, No new Rash or bruise     Data Review:    CBC Recent Labs  Lab 05/27/20 0629 05/28/20 0151  05/29/20 0055 05/30/20 0317 05/31/20 0450  WBC 17.0* 15.7* 15.0* 13.7* 10.5  HGB 10.3* 9.6* 9.4* 10.4* 9.8*  HCT 31.7* 28.7* 28.4* 32.7* 30.5*  PLT 148* 190 242 187 412*  MCV 89.8 87.2 86.6 89.1 87.6  MCH 29.2 29.2 28.7 28.3 28.2  MCHC 32.5 33.4 33.1 31.8 32.1  RDW 12.5 12.0 11.9 12.2 12.1    Recent Labs  Lab 05/26/20 1300 05/27/20 0629 05/28/20 0151 05/28/20 0438 05/28/20 0859 05/28/20 0947 05/28/20 1225 05/29/20 0055 05/30/20 0317 05/31/20 0450 05/31/20 0828  NA  --    < > 129*  --  131*  --   --  130* 130*  --  129*  K  --    < >  4.1  --  3.9  --   --  3.8 4.1  --  3.7  CL  --    < > 99  --  99  --   --  98 98  --  99  CO2  --    < > 18*  --  19*  --   --  21* 16*  --  19*  GLUCOSE  --    < > 115*  --  115*  --   --  104* 94  --  102*  BUN  --    < > 9  --  8  --   --  9 13  --  9  CREATININE  --    < > 0.56  --  0.60  --   --  0.57 0.57  --  0.51  CALCIUM  --    < > 8.2*  --  8.5*  --   --  8.4* 8.5*  --  8.4*  AST  --   --   --   --  71*  --   --  66* 82*  --   --   ALT  --   --   --   --  56*  --   --  57* 72*  --   --   ALKPHOS  --   --   --   --  44  --   --  43 53  --   --   BILITOT  --   --   --   --  0.3  --   --  0.2* 0.7  --   --   ALBUMIN  --   --   --   --  2.4*  --   --  2.0* 2.3*  --   --   MG  --   --  2.0  --   --   --   --  2.1  --   --  2.0  CRP  --   --   --   --   --  28.8*  --   --   --   --  17.3*  DDIMER  --   --   --   --   --  12.89*  --   --   --   --  6.37*  PROCALCITON <0.10  --   --   --   --   --  0.25 0.32 0.41  --  0.19  LATICACIDVEN  --   --   --  0.8  --   --   --   --   --   --   --   INR  --   --   --   --   --   --   --  1.1 1.1 1.1  --   BNP  --   --   --   --   --   --   --   --   --   --  33.8   < > = values in this interval not displayed.    ------------------------------------------------------------------------------------------------------------------ No results for input(s): CHOL, HDL, LDLCALC, TRIG, CHOLHDL, LDLDIRECT  in the last 72 hours.  Lab Results  Component Value Date   HGBA1C 5.6 05/20/2020   ------------------------------------------------------------------------------------------------------------------ No results for input(s): TSH, T4TOTAL, T3FREE, THYROIDAB in the last 72 hours.  Invalid input(s): FREET3  Cardiac Enzymes No results for input(s): CKMB,  TROPONINI, MYOGLOBIN in the last 168 hours.  Invalid input(s): CK ------------------------------------------------------------------------------------------------------------------    Component Value Date/Time   BNP 33.8 05/31/2020 0828    Micro Results Recent Results (from the past 240 hour(s))  Culture, blood (routine x 2)     Status: None   Collection Time: 05/23/20  3:44 PM   Specimen: BLOOD  Result Value Ref Range Status   Specimen Description BLOOD SITE NOT SPECIFIED  Final   Special Requests   Final    BOTTLES DRAWN AEROBIC ONLY Blood Culture adequate volume   Culture   Final    NO GROWTH 5 DAYS Performed at [facility], [address], [place], [place] [address]    Report Status 05/28/2020 FINAL  Final  Culture, blood (routine x 2)     Status: None   Collection Time: 05/23/20  7:57 PM   Specimen: BLOOD  Result Value Ref Range Status   Specimen Description BLOOD RIGHT ANTECUBITAL  Final   Special Requests   Final    BOTTLES DRAWN AEROBIC ONLY Blood Culture adequate volume   Culture   Final    NO GROWTH 5 DAYS Performed at [facility], [address], [place], [place] [address]    Report Status 05/28/2020 FINAL  Final  Culture, Urine     Status: None   Collection Time: 05/23/20  9:03 PM   Specimen: Urine, Random  Result Value Ref Range Status   Specimen Description URINE, RANDOM  Final   Special Requests NONE  Final   Culture   Final    NO GROWTH Performed at [facility], [address], [place], [place] [address]    Report Status 05/24/2020 FINAL  Final  Culture, blood (routine x 2)      Status: None (Preliminary result)   Collection Time: 05/28/20  4:39 AM   Specimen: BLOOD RIGHT HAND  Result Value Ref Range Status   Specimen Description BLOOD RIGHT HAND  Final   Special Requests   Final    BOTTLES DRAWN AEROBIC ONLY Blood Culture results may not be optimal due to an inadequate volume of blood received in culture bottles   Culture   Final    NO GROWTH 3 DAYS Performed at [facility], [address], [place], [place] [address]    Report Status PENDING  Incomplete  Culture, blood (routine x 2)     Status: None (Preliminary result)   Collection Time: 05/28/20  4:39 AM   Specimen: BLOOD  Result Value Ref Range Status   Specimen Description BLOOD RIGHT ARM  Final   Special Requests   Final    BOTTLES DRAWN AEROBIC ONLY Blood Culture results may not be optimal due to an inadequate volume of blood received in culture bottles   Culture   Final    NO GROWTH 3 DAYS Performed at [facility], [address], [place], [place] [address]    Report Status PENDING  Incomplete  Culture, Urine     Status: Abnormal   Collection Time: 05/28/20  1:51 PM   Specimen: Urine, Random  Result Value Ref Range Status   Specimen Description URINE, RANDOM  Final   Special Requests   Final    NONE Performed at [facility], [address], [place], [place] [address]    Culture >=100,000 COLONIES/mL ESCHERICHIA COLI (A)  Final   Report Status 05/30/2020 FINAL  Final   Organism ID, Bacteria ESCHERICHIA COLI (  A)  Final      Susceptibility   Escherichia coli - MIC*    AMPICILLIN >=32 RESISTANT Resistant     CEFAZOLIN 16 SENSITIVE Sensitive     CEFTRIAXONE <=0.25 SENSITIVE Sensitive     CIPROFLOXACIN <=0.25 SENSITIVE Sensitive     GENTAMICIN >=16 RESISTANT Resistant     IMIPENEM <=0.25 SENSITIVE Sensitive     NITROFURANTOIN <=16 SENSITIVE Sensitive     TRIMETH/SULFA <=20 SENSITIVE Sensitive     AMPICILLIN/SULBACTAM >=32 RESISTANT Resistant     PIP/TAZO <=4 SENSITIVE  Sensitive     * >=100,000 COLONIES/mL ESCHERICHIA COLI    Radiology Reports CT Code Stroke CTA Head W/WO contrast  Result Date: 05/19/2020 CLINICAL DATA:  Stroke suspected. EXAM: CT ANGIOGRAPHY HEAD AND NECK TECHNIQUE: Multidetector CT imaging of the head and neck was performed using the standard protocol during bolus administration of intravenous contrast. Multiplanar CT image reconstructions and MIPs were obtained to evaluate the vascular anatomy. Carotid stenosis measurements (when applicable) are obtained utilizing NASCET criteria, using the distal internal carotid diameter as the denominator. CONTRAST:  Dose is currently not known COMPARISON:  Head CT reported separately FINDINGS: CTA NECK FINDINGS Aortic arch: Negative.  Three vessel branching Right carotid system: No atheromatous changes, dissection, or beading. Left carotid system: Mild motion artifact which could account for the mildly smudged appearance of the left ICA proximally. Vertebral arteries: No proximal subclavian stenosis. Codominant vertebral arteries that are smooth and widely patent to the dura. Skeleton: No acute or aggressive finding Other neck: Negative Upper chest: Negative Review of the MIP images confirms the above findings CTA HEAD FINDINGS Anterior circulation: Cut off at the left ICA terminus with visible luminal clot. There is continuation of non opacification throughout the left M1 segment which is clot by CT. Non opacification at the left A1 origin. No contralateral branch occlusion is seen. Intravascular enhancement seen in the left operculum branches on the delayed phase. Posterior circulation: The vertebral and basilar arteries are smooth and widely patent. No branch occlusion, beading, or aneurysm. Venous sinuses: Patent Anatomic variants: None significant Review of the MIP images confirms the above findings These results were called by telephone at the time of interpretation on 05/19/2020 at 11:35 am to provider MCNEILL  [facility] , who is already aware. IMPRESSION: 1. 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 2. Mildly indistinct appearance of the proximal left ICA is likely from motion artifact, consider cervical run. No atheromatous changes or vasculopathy seen in the neck. Electronically Signed   By: Marnee Spring M.D.   On: 05/19/2020 11:40   CT Head Wo Contrast  Result Date: 05/29/2020 CLINICAL DATA:  Intracranial hemorrhage follow-up. EXAM: CT HEAD WITHOUT CONTRAST TECHNIQUE: Contiguous axial images were obtained from the base of the skull through the vertex without intravenous contrast. COMPARISON:  CT head 05/24/2020 FINDINGS: Brain: Interval decrease in the size of hyperdense anterior interhemispheric hemorrhage, which is both intraparenchymal and extra-axial. Interval decrease in small volume intraventricular hemorrhage that is layering in the occipital horns bilaterally. No new areas of hemorrhage. Mildly decreased edema associated with a left parafalcine frontal ACA branch territory infarct. Similar edema associated with a left lateral lenticulostriate MCA territory infarct. No new areas of acute large vascular territory infarct. Slight decrease in the size of the temporal horns lateral ventricles, which are mildly rounded. No new mass effect. No substantial midline shift. MIld suprasellar cistern effacement. Vascular: No interval findings. Skull: Negative. Sinuses/Orbits: Left sphenoid opacification with  frothy secretions. IMPRESSION: 1. Interval decrease in the size/conspicuity of intraparenchymal, extra-axial, and intraventricular hemorrhage, as above. No new acute hemorrhage. 2. Redemonstrated subacute left ACA and left MCA territory infarcts. No progressive mass effect. 3. Slightly improved mild ventriculomegaly. Electronically Signed   By: Feliberto Harts MD   On: 05/29/2020 08:26   CT HEAD WO CONTRAST  Result Date: 05/24/2020 CLINICAL DATA:  Stroke follow-up.   COVID positive EXAM: CT HEAD WITHOUT CONTRAST TECHNIQUE: Contiguous axial images were obtained from the base of the skull through the vertex without intravenous contrast. COMPARISON:  Yesterday FINDINGS: Brain: Anterior interhemispheric hemorrhage (subarachnoid and also parenchymal) with adjacent left ACA branch infarct that is pericallosal. There is also a lateral lenticulostriate infarct on the left. Intraventricular hemorrhage with lateral ventriculomegaly that measures similar to yesterday. No evidence of new hemorrhage or new infarct. Vascular: No interval finding Skull: Negative Sinuses/Orbits: Left sphenoid sinus opacification. IMPRESSION: 1. 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 2. Unchanged intracranial hemorrhage with mild lateral ventriculomegaly. Electronically Signed   By: Marnee Spring M.D.   On: 05/24/2020 07:13   CT HEAD WO CONTRAST  Result Date: 05/22/2020 CLINICAL DATA:  24 year old female status post code stroke presentation on 05/19/2020 with left ICA terminus ELVO. Status post endovascular reperfusion, subarachnoid and intra-axial hemorrhage. EXAM: CT HEAD WITHOUT CONTRAST TECHNIQUE: Contiguous axial images were obtained from the base of the skull through the vertex without intravenous contrast. COMPARISON:  Brain MRI 05/20/2020 and earlier. Head CT 05/21/2020 and earlier. FINDINGS: Brain: Decreased intraventricular hemorrhage since yesterday with small volume residual. There is mild ventriculomegaly, possible mild transependymal edema. Left inferior frontal gyrus parasagittal blood which appears to be both intra-and extra-axial encompasses 48 by 19 x 26 mm (AP by transverse by CC), and is stable since yesterday (estimated total volume 12 mL). Associated edema in the medial left inferior frontal gyrus, gyrus rectus and cingulate is stable. Stable mild regional mass effect. No midline shift. Basilar cisterns remain patent. Trace subarachnoid hemorrhage elsewhere. Cytotoxic edema  redemonstrated in the left basal ganglia and stable. Stable gray-white matter differentiation throughout the brain. Vascular: No suspicious intracranial vascular hyperdensity now. Skull: No acute osseous abnormality identified. Sinuses/Orbits: Stable fluid levels in the sphenoid sinuses. Other paranasal sinuses and mastoids are stable and well pneumatized. Other: No acute orbit or scalp soft tissue finding. IMPRESSION: 1. Regressed intraventricular hemorrhage since yesterday with small volume residual. Stable mild ventriculomegaly, and possible mild transependymal edema. 2. Stable left inferior frontal gyrus parasagittal hematoma (approximately 12 mL) and regional edema. No significant midline shift, basilar cisterns are patent. 3. Stable left basal ganglia infarcts. 4. No new intracranial abnormality. Electronically Signed   By: Odessa Fleming M.D.   On: 05/22/2020 08:40   CT HEAD WO CONTRAST  Result Date: 05/21/2020 CLINICAL DATA:  Stroke, follow-up. EXAM: CT HEAD WITHOUT CONTRAST TECHNIQUE: Contiguous axial images were obtained from the base of the skull through the vertex without intravenous contrast. COMPARISON:  CT head without contrast 05/19/2020 and 05/20/2020. MR head without contrast 05/20/2020. FINDINGS: Brain: Left basal ganglia nonhemorrhagic infarction is again noted. Predominantly extra-axial medial left frontal hemorrhage is again noted, extending into the ventricles bilaterally. No significant change in hemorrhage volume is present. Ventricles are further dilated. No new areas of hemorrhage are present. Additional subarachnoid hemorrhage is now seen posteriorly on the left and along the inter cerebral hemisphere. Hemorrhage is present within the aqueduct of Sylvius. Vascular: No hyperdense vessel or unexpected calcification. Skull: Calvarium is intact. No focal lytic  or blastic lesions are present. No significant extracranial soft tissue lesion is present. Sinuses/Orbits: Fluid is present in the  sphenoid sinuses bilaterally and in the nasopharynx. The paranasal sinuses and mastoid air cells are otherwise clear. The globes and orbits are within normal limits. IMPRESSION: 1. Stable appearance of left basal ganglia nonhemorrhagic infarction. 2. Stable medial left frontal extra-axial hemorrhage, extending into the ventricles bilaterally. 3. Subarachnoid hemorrhage posteriorly on the left and along the inter cerebral hemisphere likely represents redistribution without significant new hemorrhage. 4. Hemorrhage is present within the aqueduct of Sylvius. 5. Progressive dilation of the ventricles compatible with developing hydrocephalus. 6. No new areas of hemorrhage. Electronically Signed   By: Marin Roberts M.D.   On: 05/21/2020 06:39   CT HEAD WO CONTRAST  Result Date: 05/20/2020 CLINICAL DATA:  Intracranial hemorrhage follow up EXAM: CT HEAD WITHOUT CONTRAST TECHNIQUE: Contiguous axial images were obtained from the base of the skull through the vertex without intravenous contrast. COMPARISON:  05/19/2020 FINDINGS: Brain: There is subarachnoid hemorrhage again along the anterior interhemispheric fissure with extension into both lateral ventricles. The temporal horn of the left lateral ventricle has slightly increased in size. The amount of blood in the ventricles is also slightly greater. There is no herniation. Vascular: No hyperdense vessel or unexpected calcification. Skull: Normal. Negative for fracture or focal lesion. Sinuses/Orbits: No acute finding. Other: None. IMPRESSION: 1. Subarachnoid hemorrhage along the anterior interhemispheric fissure with slightly increased extension into both lateral ventricles. 2. Slight increase in size of the temporal horn of the left lateral ventricle consistent with early communicating hydrocephalus. Electronically Signed   By: Deatra Robinson M.D.   On: 05/20/2020 01:31   CT HEAD WO CONTRAST  Result Date: 05/19/2020 CLINICAL DATA:  Patient status post  intervention for left internal carotid and MCA occlusion. EXAM: CT HEAD WITHOUT CONTRAST TECHNIQUE: Contiguous axial images were obtained from the base of the skull through the vertex without intravenous contrast. COMPARISON:  Head CT earlier today. FINDINGS: Brain: The patient has a new large volume of subarachnoid and intraventricular hemorrhage centered anteriorly at the circle-of-Willis. Hemorrhage extends into the fourth ventricle. There is no midline shift or hydrocephalus. Gray-white differentiation appears preserved. No mass is identified. Imaged intracranial contents appear normal. Vascular: Large vessel structures are largely opacified with contrast. Skull: Intact. Sinuses/Orbits: Negative. Other: None. IMPRESSION: New large volume of subarachnoid and intraventricular hemorrhage. Critical Value/emergent results were called by telephone at the time of interpretation on 05/19/2020 at 3:38 pm to provider Dr. Amada Jupiter, who verbally acknowledged these results. Electronically Signed   By: Drusilla Kanner M.D.   On: 05/19/2020 15:42   CT Code Stroke CTA Neck W/WO contrast  Result Date: 05/19/2020 CLINICAL DATA:  Stroke suspected. EXAM: CT ANGIOGRAPHY HEAD AND NECK TECHNIQUE: Multidetector CT imaging of the head and neck was performed using the standard protocol during bolus administration of intravenous contrast. Multiplanar CT image reconstructions and MIPs were obtained to evaluate the vascular anatomy. Carotid stenosis measurements (when applicable) are obtained utilizing NASCET criteria, using the distal internal carotid diameter as the denominator. CONTRAST:  Dose is currently not known COMPARISON:  Head CT reported separately FINDINGS: CTA NECK FINDINGS Aortic arch: Negative.  Three vessel branching Right carotid system: No atheromatous changes, dissection, or beading. Left carotid system: Mild motion artifact which could account for the mildly smudged appearance of the left ICA proximally. Vertebral  arteries: No proximal subclavian stenosis. Codominant vertebral arteries that are smooth and widely patent to the dura. Skeleton: No acute  or aggressive finding Other neck: Negative Upper chest: Negative Review of the MIP images confirms the above findings CTA HEAD FINDINGS Anterior circulation: Cut off at the left ICA terminus with visible luminal clot. There is continuation of non opacification throughout the left M1 segment which is clot by CT. Non opacification at the left A1 origin. No contralateral branch occlusion is seen. Intravascular enhancement seen in the left operculum branches on the delayed phase. Posterior circulation: The vertebral and basilar arteries are smooth and widely patent. No branch occlusion, beading, or aneurysm. Venous sinuses: Patent Anatomic variants: None significant Review of the MIP images confirms the above findings These results were called by telephone at the time of interpretation on 05/19/2020 at 11:35 am to provider MCNEILL [facility] , who is already aware. IMPRESSION: 1. 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 2. Mildly indistinct appearance of the proximal left ICA is likely from motion artifact, consider cervical run. No atheromatous changes or vasculopathy seen in the neck. Electronically Signed   By: Marnee Spring M.D.   On: 05/19/2020 11:40   CT ANGIO CHEST PE W OR WO CONTRAST  Result Date: 05/28/2020 CLINICAL DATA:  24 year old female with tachycardia, COVID and positive D-dimer. EXAM: CT ANGIOGRAPHY CHEST WITH CONTRAST TECHNIQUE: Multidetector CT imaging of the chest was performed using the standard protocol during bolus administration of intravenous contrast. Multiplanar CT image reconstructions and MIPs were obtained to evaluate the vascular anatomy. CONTRAST:  60mL OMNIPAQUE IOHEXOL 350 MG/ML SOLN COMPARISON:  None. FINDINGS: Cardiovascular: This is a technically adequate study, but motion artifact does limit  evaluation in some areas. Multiple pulmonary emboli are identified and within the LEFT LOWER lobe pulmonary artery, and multiple bilateral segmental LOWER lobe pulmonary arteries. Equivocal emboli within LEFT UPPER lobe segmental pulmonary arteries noted. Mild cardiomegaly is present.  RV/LV ratio equals 0.84. There is no evidence of thoracic aortic aneurysm or pericardial effusion. Mediastinum/Nodes: No enlarged mediastinal, hilar, or axillary lymph nodes. Thyroid gland, trachea, and esophagus demonstrate no significant findings. Lungs/Pleura: No airspace disease, consolidation, mass or nodule. No pleural effusion or pneumothorax. Upper Abdomen: No acute abnormality. Musculoskeletal: No chest wall abnormality. No acute or significant osseous findings. Review of the MIP images confirms the above findings. IMPRESSION: 1. Bilateral segmental LOWER lobe pulmonary emboli, LEFT-greater-than-RIGHT. Equivocal segmental emboli within the LEFT UPPER lobe. 2. Mild cardiomegaly. Critical Value/emergent results were called by telephone at the time of interpretation on 05/28/2020 at 2:41 pm to provider Arlys John, nurse on this floor,, who verbally acknowledged these results. Electronically Signed   By: Harmon Pier M.D.   On: 05/28/2020 14:45   MR BRAIN WO CONTRAST  Result Date: 05/20/2020 CLINICAL DATA:  Sudden onset aphasia and right-sided weakness EXAM: MRI HEAD WITHOUT CONTRAST TECHNIQUE: Multiplanar, multiecho pulse sequences of the brain and surrounding structures were obtained without intravenous contrast. COMPARISON:  Head CT 05/20/2020 FINDINGS: Brain: Early subacute infarct of the left basal ganglia involving the posterior putamen and caudate body. There is subarachnoid hemorrhage along the interhemispheric fissure extending into the lateral ventricles. There is early communicating hydrocephalus as evidenced by slightly increased size of the temporal horns. There is no herniation or other mass effect Vascular: Normal  flow voids Skull and upper cervical spine: Normal Sinuses/Orbits: Normal Other: Fluid in the nasopharynx. IMPRESSION: 1. Early subacute infarct of the left basal ganglia involving the posterior putamen and caudate body. 2. Subarachnoid hemorrhage along the interhemispheric fissure extending into the lateral ventricles. 3. Early communicating hydrocephalus  as evidenced by slightly increased size of the temporal horns. Electronically Signed   By: Deatra Robinson M.D.   On: 05/20/2020 02:23   IR CT Head Ltd  Result Date: 05/21/2020 INDICATION: 24 year old female with a history of acute left MCA syndrome, presents for mechanical thrombectomy COVID test was confirmed positive after initiation of the case EXAM: ULTRASOUND-GUIDED RIGHT COMMON FEMORAL ARTERY ACCESS CERVICAL AND CEREBRAL ANGIOGRAM MECHANICAL THROMBECTOMY LEFT MCA ANGIO-SEAL FOR HEMOSTASIS COMPARISON:  CT imaging same day MEDICATIONS: 200 mcg intra arterial nitroglycerin. ANESTHESIA/SEDATION: The anesthesia team was present to provide general endotracheal tube anesthesia and for patient monitoring during the procedure. Intubation was performed in negative pressure [place] in neuro IR holding. Left radial arterial line was performed by the anesthesia team. Interventional neuro radiology nursing staff was also present. CONTRAST:  150 cc FLUOROSCOPY TIME:  Fluoroscopy Time: 53 minutes 36 seconds (1651 mGy). COMPLICATIONS: SIR LEVEL C - Requires therapy, minor hospitalization (<48 hrs). Subarachnoid hemorrhage TECHNIQUE: Informed written consent was obtained from the patient's family after a thorough discussion of the procedural risks, benefits and alternatives. Specific risks discussed include: Bleeding, infection, contrast reaction, kidney injury/failure, need for further procedure/surgery, arterial injury or dissection, embolization to new territory, intracranial hemorrhage (10-15% risk), neurologic deterioration, cardiopulmonary collapse, death. All questions  were addressed. Maximal Sterile Barrier Technique was utilized including during the procedure including caps, mask, sterile gowns, sterile gloves, sterile drape, hand hygiene and skin antiseptic. A timeout was performed prior to the initiation of the procedure. The anesthesia team was present to provide general endotracheal tube anesthesia and for patient monitoring during the procedure. Interventional neuro radiology nursing staff was also present. FINDINGS: Initial Findings: Left common carotid artery: ICA patent with no tortuosity and no significant atherosclerosis. Separate branch from the aortic arch. Left external carotid artery: Patent with antegrade flow. Left internal carotid artery: The common carotid artery is relatively straight with no beading or significant narrowing. The AP image on the initial image demonstrates perhaps a slight irregularity on the lateral intimal surface which may correspond to the coronal reformatted images on the comparison CT preprocedure (image 25 of series 9). Given that there was no flow limiting problem, we elected to proceed with the thrombectomy Left MCA: The vertical horizontal petrous segment patent. Segment lacerum patent. Cavernous and supraclinoid patent. Occlusion of the proximal M1 segment with visible filling defect compatible with the findings on the CT. Left ACA: A1 is patent. There is a nearly 180 degree angulation of the A1 origin. Patent anterior communicating artery. There has been migration of the thromboembolism through the terminal segment into the A2 segment compared to the prior CT. Procedural findings: Baseline MCA flow: TICI 0 Left MCA: After 1 pass, there is no significant restoration of flow. Upon placement of the stent tree bronch the second attempt, and angiogram demonstrated no significant flow through the solitaire. After the second pass, complete restoration of flow through the MCA territory was achieved, TICI 3. During this time, there was  further migration of the A2 embolus to the pericallosal artery, with the ultimate occlusion approximately level to the coronal suture. Given that the anatomy would not allow and navigation of the microcatheter and intermediate catheter into the segment, and given the perceived density of the ACA thromboembolism, we ultimately withdrew from further attempts thrombectomy when the wire and microcatheter would not pass through the face of the thrombus. Final angiogram demonstrated no evidence of extravasation of contrast at this level. The flat panel CT demonstrates inter hemispheric subarachnoid  hemorrhage without mass effect. Angiogram of the cervical ICA upon withdrawal of the balloon catheter demonstrated long segment irregularity, with variations in the diameter extending from beyond the bifurcation to the horizontal petrous segment. The irregularity was not only spatial but variations in temporal appearance, as the narrowing worsen to greater than 80%, such as that demonstrated on image 5/7 angiogram 23. After 20 minutes of placing orogastric tube and giving dual antiplatelets dosage, anticipating stenting, a repeat angiogram demonstrated improvement in the appearance of the diameter, image 5/7 of angiogram 24. At this time we elected not to stent. PROCEDURE: The anesthesia team was present to provide general endotracheal tube anesthesia and for patient monitoring during the procedure. Intubation was performed in negative pressure [place] in neuro IR holding. Interventional neuro radiology nursing staff was also present. Ultrasound survey of the right inguinal region was performed with images stored and sent to PACs. 11 blade scalpel was used to make a small incision. Blunt dissection was performed with US guidance. A micropuncture needle was used access the right common femoral artery under ultrasound. With excellent arterial blood flow returned, an .018 micro wire was passed through the needle, observed to enter the  abdominal aorta under fluoroscopy. The needle was removed, and a micropuncture sheath was placed over the wire. The inner dilator and wire were removed, and an 035 wire was advanced under fluoroscopy into the abdominal aorta. The sheath was removed and a 25cm 87F straight vascular sheath was placed. The dilator was removed and the sheath was flushed. Sheath was attached to pressurized and heparinized saline bag for constant forward flow. A coaxial system was then advanced over the 035 wire. This included a 95cm 087 "Walrus" balloon guide with coaxial 125cm Berenstein diagnostic catheter. This was advanced to the proximal descending thoracic aorta. Wire was then removed. Double flush of the catheter was performed. Catheter was then used to select the left common carotid artery. Angiogram was performed. Using roadmap technique, the catheter was advanced over a standard glide wire into left cervical ICA, with luminal position achieved of the guide catheter. Wire was removed and angiogram was performed of the left carotid artery. Initial angiogram revealed no intimal/luminal irregularity and we proceeded with placement of the balloon guide into the distal cervical ICA. The balloon guide and the parents teen catheter were advanced with a Glidewire into the distal cervical segment of the ICA. Glidewire and the Berenstein catheter were gently removed and double flush was performed. Formal angiogram was performed. Road map function was used once the occluded vessel was identified. Copious back flush was performed and the balloon catheter was attached to heparinized and pressurized saline bag for forward flow. A second coaxial system was then advanced through the balloon catheter, which included the selected intermediate catheter, microcatheter, and microwire. In this scenario, the set up included intermediate catheter, large-bore aspiration catheter, 135 cm, a Trevo Provue18 microcatheter, and 014 synchro soft wire. This  system was advanced through the balloon guide catheter under the road-map function, with adequate back-flush at the rotating hemostatic valve at that back end of the balloon guide. Microcatheter and the intermediate catheter system were advanced through the terminal ICA and MCA to the level of the occlusion. The micro wire was then carefully advanced through the occluded segment. Microcatheter was then manipulated through the occluded segment and the wire was removed with saline drip at the hub. Blood was then aspirated through the hub of the microcatheter, and a gentle contrast injection was performed confirming intraluminal  position. A rotating hemostatic valve was then attached to the back end of the microcatheter, and a pressurized and heparinized saline bag was attached to the catheter. 4 x 40 solitaire device was then selected. Back flush was achieved at the rotating hemostatic valve, and then the device was gently advanced through the microcatheter to the distal end. The retriever was then unsheathed by withdrawing the microcatheter under fluoroscopy. Once the retriever was completely unsheathed, the microcatheter was carefully stripped from the delivery device. A 3 minute time interval was observed. The balloon at the balloon guide catheter was then inflated under fluoroscopy for proximal flow arrest. Constant aspiration using the proprietary engine was then performed at the intermediate catheter, as the retriever was gently and slowly withdrawn with fluoroscopic observation. Once the retriever was "corked" within the tip of the intermediate catheter, both were removed from the system. Free aspiration was confirmed at the hub of the balloon guide catheter, with free blood return confirmed. The balloon was then deflated, and a control angiogram was performed. The M1 segment remained occluded after the first pass. We then attempted a second pass. Microcatheter and the intermediate catheter system were again  advanced through the terminal ICA and MCA to the level of the occlusion. The micro wire was then carefully advanced through the occluded segment. Microcatheter was then manipulated through the occluded segment and the wire was removed with saline drip at the hub. Blood was then aspirated through the hub of the microcatheter, and a gentle contrast injection was performed confirming intraluminal position. A rotating hemostatic valve was then attached to the back end of the microcatheter, and a pressurized and heparinized saline bag was attached to the catheter. 4 x 40 solitaire device was again used. Back flush was achieved at the rotating hemostatic valve, and then the device was gently advanced through the microcatheter to the distal end. The retriever was then unsheathed by withdrawing the microcatheter under fluoroscopy. Once the retriever was completely unsheathed, the microcatheter was carefully stripped from the delivery device. Control angiogram was performed from the intermediate catheter, demonstrating that there was no flow through the open solitaire. A 3 minute time interval was observed. The balloon at the balloon guide catheter was then inflated under fluoroscopy for proximal flow arrest. Aspiration engine was turned on. Intermediate catheter was gently advanced over the proximal solitaire to the level of the occlusion, with flow arrest. Constant aspiration using the proprietary engine was then performed at the intermediate catheter, as both the retriever and the intermediate catheter were gently and slowly withdrawn with fluoroscopic observation. Once the retriever was "corked" within the tip of the intermediate catheter, both were removed from the system. Free aspiration was confirmed at the hub of the balloon guide catheter, with free blood return confirmed. The balloon was then deflated, and a control angiogram was performed. Restoration of left MCA flow was confirmed. The initial left A2 segment  occlusion was observed to have migrated distally to the pericallosal artery. Given the patient's young age we decided to proceed with attempt at mechanical thrombectomy of this ACA embolus. ACA thrombectomy attempt: The walrus balloon catheter remained in the distal cervical segment, proximal to the horizontal petrous segment. We advanced a coaxial wire and catheter system which included 014 synchro soft wire, Zoom 35 intermediate catheter, and tree though pro view 18 microcatheter. This was advanced through the cavernous segment to the supraclinoid segment. This catheter and wire combination failed to engage the angulated A1 origin. We then attempted a coaxial  system which included CT 5 intermediate catheter, 150cm phenom 21 microcatheter, and synchro soft wire. This coaxial system was advanced through the cavernous segment, supraclinoid segment, and failed to engage the A1 segment. Various micro wires were then used including Aristotle wire, and double angle headliner wire. The 014 headliner wire was ultimately successful engaging the A1 segment, with a pericallosal position achieved. The intermediate catheter would not make the nearly 180 degree turn into the A1 segment. After several attempts at achieving n/a 1 position with the intermediate catheter, we decided that we could attempt to use an anchoring technique by deploying stent tree verb across the embolism, and then bringing the intermediate catheter over the microcatheter and stent deployed stent tree verb. This would require, however, passing through the embolus with the microcatheter to deployed the stent tree verge. The microcatheter and microwire combination would not pass gently through the thromboembolism within the pericallosal artery, with quite a dense character observed when the wire and microcatheter were at the face of the thrombus. Ultimately we decided to withdraw from this attempt given the difficulty, and the apparent reactivity of the  patient's carotid artery as we observed spasm and irregularity at the skull base on each subsequent control angiogram. The microcatheter was withdrawn to the cavernous segment of the ICA. Wire was removed. Exchange length floppy tip transcend wire was then placed through the microcatheter into a safe position. The balloon guide catheter was then withdrawn into the common carotid artery and angiogram was performed. This revealed irregularity and narrowing in the proximal ICA, which continued with multiple variation in diameter through the cervical ICA to the distal segment at the site of the prior inflated balloon. These findings were discussed with the neurology team, with concern for dissection, as there was irregularity at this segment on the preprocedural CT scan, indicating possible vasculitis/dissection as not only the current imaging diagnosis, but potentially site of the thromboembolism origin. At this point we performed flat panel CT scan to observe for ICH/S AH, as treatment with a stent would require dual anti-platelet therapy. Flat panel CT demonstrated thin subarachnoid hemorrhage in the inter hemispheric fissure. Interval angiogram of the ICA was performed to determine the need for any possible stenting. 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 This imaging finding was concerning for progression of a possible intramural hematoma/dissection and at this point we elected to move forward with stenting. At least 20 minutes were required to place an orogastric tube and then deliver our prescribed dose of aspirin and Plavix. Once these events had occurred, repeat angiogram was performed. Interestingly this image revealed significant improvement at the cervical segment, with residual irregularity (image XA #24). At this point we interpreted this as either improving vaso spasm or improving sequela of vasculitis/dissection. Given the flat  panel head CT findings and the desire to avoid dual anti-platelet therapy long-term, we elected to withdraw from the case at this point with no stent deployed. Balloon guide was then removed, with all catheters and wires removed. The skin at the puncture site was then cleaned with Chlorhexidine. The 8 French sheath was removed and an 48F angioseal was deployed. Patient tolerated the procedure well and remained hemodynamically stable throughout. Blood loss estimated at 150 cc. IMPRESSION: Status post ultrasound guided access right common femoral artery for cervical/cerebral angiogram and mechanical thrombectomy of left MCA restoring complete flow to the left MCA territory, and failed attempt at revascularization of left pericallosal thromboembolism.  Flat panel CT at the completion demonstrates thin subarachnoid hemorrhage in the anterior inter hemispheric fissure. Angio-Seal deployed for hemostasis. Signed, Yvone Neu. Reyne Dumas, RPVI [facility] Radiology PLAN: The patient will remain intubated, as she remains COVID status unknown ICU status Target systolic blood pressure of 140-160 Right hip straight time 6 hours Frequent neurovascular checks Repeat neurologic imaging with CT and/MRI at the discretion of neurology team Electronically Signed   By: Gilmer Mor D.O.   On: 05/21/2020 08:57   IR CT Head Ltd  Result Date: 05/21/2020 INDICATION: 24 year old female with a history of acute left MCA syndrome, presents for mechanical thrombectomy COVID test was confirmed positive after initiation of the case EXAM: ULTRASOUND-GUIDED RIGHT COMMON FEMORAL ARTERY ACCESS CERVICAL AND CEREBRAL ANGIOGRAM MECHANICAL THROMBECTOMY LEFT MCA ANGIO-SEAL FOR HEMOSTASIS COMPARISON:  CT imaging same day MEDICATIONS: 200 mcg intra arterial nitroglycerin. ANESTHESIA/SEDATION: The anesthesia team was present to provide general endotracheal tube anesthesia and for patient monitoring during the  procedure. Intubation was performed in negative pressure Bay in neuro IR holding. Left radial arterial line was performed by the anesthesia team. Interventional neuro radiology nursing staff was also present. CONTRAST:  150 cc FLUOROSCOPY TIME:  Fluoroscopy Time: 53 minutes 36 seconds (1651 mGy). COMPLICATIONS: SIR LEVEL C - Requires therapy, minor hospitalization (<48 hrs). Subarachnoid hemorrhage TECHNIQUE: Informed written consent was obtained from the patient's family after a thorough discussion of the procedural risks, benefits and alternatives. Specific risks discussed include: Bleeding, infection, contrast reaction, kidney injury/failure, need for further procedure/surgery, arterial injury or dissection, embolization to new territory, intracranial hemorrhage (10-15% risk), neurologic deterioration, cardiopulmonary collapse, death. All questions were addressed. Maximal Sterile Barrier Technique was utilized including during the procedure including caps, mask, sterile gowns, sterile gloves, sterile drape, hand hygiene and skin antiseptic. A timeout was performed prior to the initiation of the procedure. The anesthesia team was present to provide general endotracheal tube anesthesia and for patient monitoring during the procedure. Interventional neuro radiology nursing staff was also present. FINDINGS: Initial Findings: Left common carotid artery: ICA patent with no tortuosity and no significant atherosclerosis. Separate branch from the aortic arch. Left external carotid artery: Patent with antegrade flow. Left internal carotid artery: The common carotid artery is relatively straight with no beading or significant narrowing. The AP image on the initial image demonstrates perhaps a slight irregularity on the lateral intimal surface which may correspond to the coronal reformatted images on the comparison CT preprocedure (image 25 of series 9). Given that there was no flow limiting problem, we elected to proceed  with the thrombectomy Left MCA: The vertical horizontal petrous segment patent. Segment lacerum patent. Cavernous and supraclinoid patent. Occlusion of the proximal M1 segment with visible filling defect compatible with the findings on the CT. Left ACA: A1 is patent. There is a nearly 180 degree angulation of the A1 origin. Patent anterior communicating artery. There has been migration of the thromboembolism through the terminal segment into the A2 segment compared to the prior CT. Procedural findings: Baseline MCA flow: TICI 0 Left MCA: After 1 pass, there is no significant restoration of flow. Upon placement of the stent tree bronch the second attempt, and angiogram demonstrated no significant flow through the solitaire. After the second pass, complete restoration of flow through the MCA territory was achieved, TICI 3. During this time, there was further migration of the A2 embolus to the pericallosal artery, with the ultimate occlusion approximately level to the coronal suture. Given that the anatomy  would not allow and navigation of the microcatheter and intermediate catheter into the segment, and given the perceived density of the ACA thromboembolism, we ultimately withdrew from further attempts thrombectomy when the wire and microcatheter would not pass through the face of the thrombus. Final angiogram demonstrated no evidence of extravasation of contrast at this level. The flat panel CT demonstrates inter hemispheric subarachnoid hemorrhage without mass effect. Angiogram of the cervical ICA upon withdrawal of the balloon catheter demonstrated long segment irregularity, with variations in the diameter extending from beyond the bifurcation to the horizontal petrous segment. The irregularity was not only spatial but variations in temporal appearance, as the narrowing worsen to greater than 80%, such as that demonstrated on image 5/7 angiogram 23. After 20 minutes of placing orogastric tube and giving dual  antiplatelets dosage, anticipating stenting, a repeat angiogram demonstrated improvement in the appearance of the diameter, image 5/7 of angiogram 24. At this time we elected not to stent. PROCEDURE: The anesthesia team was present to provide general endotracheal tube anesthesia and for patient monitoring during the procedure. Intubation was performed in negative pressure Bay in neuro IR holding. Interventional neuro radiology nursing staff was also present. Ultrasound survey of the right inguinal region was performed with images stored and sent to PACs. 11 blade scalpel was used to make a small incision. Blunt dissection was performed with US guidance. A micropuncture needle was used access the right common femoral artery under ultrasound. With excellent arterial blood flow returned, an .018 micro wire was passed through the needle, observed to enter the abdominal aorta under fluoroscopy. The needle was removed, and a micropuncture sheath was placed over the wire. The inner dilator and wire were removed, and an 035 wire was advanced under fluoroscopy into the abdominal aorta. The sheath was removed and a 25cm 7F straight vascular sheath was placed. The dilator was removed and the sheath was flushed. Sheath was attached to pressurized and heparinized saline bag for constant forward flow. A coaxial system was then advanced over the 035 wire. This included a 95cm 087 "Walrus" balloon guide with coaxial 125cm Berenstein diagnostic catheter. This was advanced to the proximal descending thoracic aorta. Wire was then removed. Double flush of the catheter was performed. Catheter was then used to select the left common carotid artery. Angiogram was performed. Using roadmap technique, the catheter was advanced over a standard glide wire into left cervical ICA, with luminal position achieved of the guide catheter. Wire was removed and angiogram was performed of the left carotid artery. Initial angiogram revealed no  intimal/luminal irregularity and we proceeded with placement of the balloon guide into the distal cervical ICA. The balloon guide and the parents teen catheter were advanced with a Glidewire into the distal cervical segment of the ICA. Glidewire and the Berenstein catheter were gently removed and double flush was performed. Formal angiogram was performed. Road map function was used once the occluded vessel was identified. Copious back flush was performed and the balloon catheter was attached to heparinized and pressurized saline bag for forward flow. A second coaxial system was then advanced through the balloon catheter, which included the selected intermediate catheter, microcatheter, and microwire. In this scenario, the set up included intermediate catheter, large-bore aspiration catheter, 135 cm, a Trevo Provue18 microcatheter, and 014 synchro soft wire. This system was advanced through the balloon guide catheter under the road-map function, with adequate back-flush at the rotating hemostatic valve at that back end of the balloon guide. Microcatheter and the intermediate catheter  system were advanced through the terminal ICA and MCA to the level of the occlusion. The micro wire was then carefully advanced through the occluded segment. Microcatheter was then manipulated through the occluded segment and the wire was removed with saline drip at the hub. Blood was then aspirated through the hub of the microcatheter, and a gentle contrast injection was performed confirming intraluminal position. A rotating hemostatic valve was then attached to the back end of the microcatheter, and a pressurized and heparinized saline bag was attached to the catheter. 4 x 40 solitaire device was then selected. Back flush was achieved at the rotating hemostatic valve, and then the device was gently advanced through the microcatheter to the distal end. The retriever was then unsheathed by withdrawing the microcatheter under fluoroscopy.  Once the retriever was completely unsheathed, the microcatheter was carefully stripped from the delivery device. A 3 minute time interval was observed. The balloon at the balloon guide catheter was then inflated under fluoroscopy for proximal flow arrest. Constant aspiration using the proprietary engine was then performed at the intermediate catheter, as the retriever was gently and slowly withdrawn with fluoroscopic observation. Once the retriever was "corked" within the tip of the intermediate catheter, both were removed from the system. Free aspiration was confirmed at the hub of the balloon guide catheter, with free blood return confirmed. The balloon was then deflated, and a control angiogram was performed. The M1 segment remained occluded after the first pass. We then attempted a second pass. Microcatheter and the intermediate catheter system were again advanced through the terminal ICA and MCA to the level of the occlusion. The micro wire was then carefully advanced through the occluded segment. Microcatheter was then manipulated through the occluded segment and the wire was removed with saline drip at the hub. Blood was then aspirated through the hub of the microcatheter, and a gentle contrast injection was performed confirming intraluminal position. A rotating hemostatic valve was then attached to the back end of the microcatheter, and a pressurized and heparinized saline bag was attached to the catheter. 4 x 40 solitaire device was again used. Back flush was achieved at the rotating hemostatic valve, and then the device was gently advanced through the microcatheter to the distal end. The retriever was then unsheathed by withdrawing the microcatheter under fluoroscopy. Once the retriever was completely unsheathed, the microcatheter was carefully stripped from the delivery device. Control angiogram was performed from the intermediate catheter, demonstrating that there was no flow through the open solitaire.  A 3 minute time interval was observed. The balloon at the balloon guide catheter was then inflated under fluoroscopy for proximal flow arrest. Aspiration engine was turned on. Intermediate catheter was gently advanced over the proximal solitaire to the level of the occlusion, with flow arrest. Constant aspiration using the proprietary engine was then performed at the intermediate catheter, as both the retriever and the intermediate catheter were gently and slowly withdrawn with fluoroscopic observation. Once the retriever was "corked" within the tip of the intermediate catheter, both were removed from the system. Free aspiration was confirmed at the hub of the balloon guide catheter, with free blood return confirmed. The balloon was then deflated, and a control angiogram was performed. Restoration of left MCA flow was confirmed. The initial left A2 segment occlusion was observed to have migrated distally to the pericallosal artery. Given the patient's young age we decided to proceed with attempt at mechanical thrombectomy of this ACA embolus. ACA thrombectomy attempt: The walrus balloon catheter remained  in the distal cervical segment, proximal to the horizontal petrous segment. We advanced a coaxial wire and catheter system which included 014 synchro soft wire, Zoom 35 intermediate catheter, and tree though pro view 18 microcatheter. This was advanced through the cavernous segment to the supraclinoid segment. This catheter and wire combination failed to engage the angulated A1 origin. We then attempted a coaxial system which included CT 5 intermediate catheter, 150cm phenom 21 microcatheter, and synchro soft wire. This coaxial system was advanced through the cavernous segment, supraclinoid segment, and failed to engage the A1 segment. Various micro wires were then used including Aristotle wire, and double angle headliner wire. The 014 headliner wire was ultimately successful engaging the A1 segment, with a  pericallosal position achieved. The intermediate catheter would not make the nearly 180 degree turn into the A1 segment. After several attempts at achieving n/a 1 position with the intermediate catheter, we decided that we could attempt to use an anchoring technique by deploying stent tree verb across the embolism, and then bringing the intermediate catheter over the microcatheter and stent deployed stent tree verb. This would require, however, passing through the embolus with the microcatheter to deployed the stent tree verge. The microcatheter and microwire combination would not pass gently through the thromboembolism within the pericallosal artery, with quite a dense character observed when the wire and microcatheter were at the face of the thrombus. Ultimately we decided to withdraw from this attempt given the difficulty, and the apparent reactivity of the patient's carotid artery as we observed spasm and irregularity at the skull base on each subsequent control angiogram. The microcatheter was withdrawn to the cavernous segment of the ICA. Wire was removed. Exchange length floppy tip transcend wire was then placed through the microcatheter into a safe position. The balloon guide catheter was then withdrawn into the common carotid artery and angiogram was performed. This revealed irregularity and narrowing in the proximal ICA, which continued with multiple variation in diameter through the cervical ICA to the distal segment at the site of the prior inflated balloon. These findings were discussed with the neurology team, with concern for dissection, as there was irregularity at this segment on the preprocedural CT scan, indicating possible vasculitis/dissection as not only the current imaging diagnosis, but potentially site of the thromboembolism origin. At this point we performed flat panel CT scan to observe for ICH/S AH, as treatment with a stent would require dual anti-platelet therapy. Flat panel CT  demonstrated thin subarachnoid hemorrhage in the inter hemispheric fissure. Interval angiogram of the ICA was performed to determine the need for any possible stenting. 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 This imaging finding was concerning for progression of a possible intramural hematoma/dissection and at this point we elected to move forward with stenting. At least 20 minutes were required to place an orogastric tube and then deliver our prescribed dose of aspirin and Plavix. Once these events had occurred, repeat angiogram was performed. Interestingly this image revealed significant improvement at the cervical segment, with residual irregularity (image XA #24). At this point we interpreted this as either improving vaso spasm or improving sequela of vasculitis/dissection. Given the flat panel head CT findings and the desire to avoid dual anti-platelet therapy long-term, we elected to withdraw from the case at this point with no stent deployed. Balloon guide was then removed, with all catheters and wires removed. The skin at the puncture site was then cleaned with Chlorhexidine.  The 8 French sheath was removed and an 48F angioseal was deployed. Patient tolerated the procedure well and remained hemodynamically stable throughout. Blood loss estimated at 150 cc. IMPRESSION: Status post ultrasound guided access right common femoral artery for cervical/cerebral angiogram and mechanical thrombectomy of left MCA restoring complete flow to the left MCA territory, and failed attempt at revascularization of left pericallosal thromboembolism. Flat panel CT at the completion demonstrates thin subarachnoid hemorrhage in the anterior inter hemispheric fissure. Angio-Seal deployed for hemostasis. Signed, Yvone Neu. Reyne Dumas, RPVI [facility]ascular [facility] Interventional Radiology Special[facility] Radiology PLAN: The patient will remain intubated, as she  remains COVID status unknown ICU status Target systolic blood pressure of 140-160 Right hip straight time 6 hours Frequent neurovascular checks Repeat neurologic imaging with CT and/MRI at the discretion of neurology team Electronically Signed   By: Gilmer Mor D.O.   On: 05/21/2020 08:57   IR US Guide Vasc Access Right  Result Date: 05/21/2020 INDICATION: 24 year old female with a history of acute left MCA syndrome, presents for mechanical thrombectomy COVID test was confirmed positive after initiation of the case EXAM: ULTRASOUND-GUIDED RIGHT COMMON FEMORAL ARTERY ACCESS CERVICAL AND CEREBRAL ANGIOGRAM MECHANICAL THROMBECTOMY LEFT MCA ANGIO-SEAL FOR HEMOSTASIS COMPARISON:  CT imaging same day MEDICATIONS: 200 mcg intra arterial nitroglycerin. ANESTHESIA/SEDATION: The anesthesia team was present to provide general endotracheal tube anesthesia and for patient monitoring during the procedure. Intubation was performed in negative pressure Bay in neuro IR holding. Left radial arterial line was performed by the anesthesia team. Interventional neuro radiology nursing staff was also present. CONTRAST:  150 cc FLUOROSCOPY TIME:  Fluoroscopy Time: 53 minutes 36 seconds (1651 mGy). COMPLICATIONS: SIR LEVEL C - Requires therapy, minor hospitalization (<48 hrs). Subarachnoid hemorrhage TECHNIQUE: Informed written consent was obtained from the patient's family after a thorough discussion of the procedural risks, benefits and alternatives. Specific risks discussed include: Bleeding, infection, contrast reaction, kidney injury/failure, need for further procedure/surgery, arterial injury or dissection, embolization to new territory, intracranial hemorrhage (10-15% risk), neurologic deterioration, cardiopulmonary collapse, death. All questions were addressed. Maximal Sterile Barrier Technique was utilized including during the procedure including caps, mask, sterile gowns, sterile gloves, sterile drape, hand hygiene and skin  antiseptic. A timeout was performed prior to the initiation of the procedure. The anesthesia team was present to provide general endotracheal tube anesthesia and for patient monitoring during the procedure. Interventional neuro radiology nursing staff was also present. FINDINGS: Initial Findings: Left common carotid artery: ICA patent with no tortuosity and no significant atherosclerosis. Separate branch from the aortic arch. Left external carotid artery: Patent with antegrade flow. Left internal carotid artery: The common carotid artery is relatively straight with no beading or significant narrowing. The AP image on the initial image demonstrates perhaps a slight irregularity on the lateral intimal surface which may correspond to the coronal reformatted images on the comparison CT preprocedure (image 25 of series 9). Given that there was no flow limiting problem, we elected to proceed with the thrombectomy Left MCA: The vertical horizontal petrous segment patent. Segment lacerum patent. Cavernous and supraclinoid patent. Occlusion of the proximal M1 segment with visible filling defect compatible with the findings on the CT. Left ACA: A1 is patent. There is a nearly 180 degree angulation of the A1 origin. Patent anterior communicating artery. There has been migration of the thromboembolism through the terminal segment into the A2 segment compared to the prior CT. Procedural findings: Baseline MCA flow: TICI 0 Left MCA: After 1 pass, there is no significant restoration  of flow. Upon placement of the stent tree bronch the second attempt, and angiogram demonstrated no significant flow through the solitaire. After the second pass, complete restoration of flow through the MCA territory was achieved, TICI 3. During this time, there was further migration of the A2 embolus to the pericallosal artery, with the ultimate occlusion approximately level to the coronal suture. Given that the anatomy would not allow and navigation  of the microcatheter and intermediate catheter into the segment, and given the perceived density of the ACA thromboembolism, we ultimately withdrew from further attempts thrombectomy when the wire and microcatheter would not pass through the face of the thrombus. Final angiogram demonstrated no evidence of extravasation of contrast at this level. The flat panel CT demonstrates inter hemispheric subarachnoid hemorrhage without mass effect. Angiogram of the cervical ICA upon withdrawal of the balloon catheter demonstrated long segment irregularity, with variations in the diameter extending from beyond the bifurcation to the horizontal petrous segment. The irregularity was not only spatial but variations in temporal appearance, as the narrowing worsen to greater than 80%, such as that demonstrated on image 5/7 angiogram 23. After 20 minutes of placing orogastric tube and giving dual antiplatelets dosage, anticipating stenting, a repeat angiogram demonstrated improvement in the appearance of the diameter, image 5/7 of angiogram 24. At this time we elected not to stent. PROCEDURE: The anesthesia team was present to provide general endotracheal tube anesthesia and for patient monitoring during the procedure. Intubation was performed in negative pressure [place] in neuro IR holding. Interventional neuro radiology nursing staff was also present. Ultrasound survey of the right inguinal region was performed with images stored and sent to PACs. 11 blade scalpel was used to make a small incision. Blunt dissection was performed with US guidance. A micropuncture needle was used access the right common femoral artery under ultrasound. With excellent arterial blood flow returned, an .018 micro wire was passed through the needle, observed to enter the abdominal aorta under fluoroscopy. The needle was removed, and a micropuncture sheath was placed over the wire. The inner dilator and wire were removed, and an 035 wire was advanced under  fluoroscopy into the abdominal aorta. The sheath was removed and a 25cm 38F straight vascular sheath was placed. The dilator was removed and the sheath was flushed. Sheath was attached to pressurized and heparinized saline bag for constant forward flow. A coaxial system was then advanced over the 035 wire. This included a 95cm 087 "Walrus" balloon guide with coaxial 125cm Berenstein diagnostic catheter. This was advanced to the proximal descending thoracic aorta. Wire was then removed. Double flush of the catheter was performed. Catheter was then used to select the left common carotid artery. Angiogram was performed. Using roadmap technique, the catheter was advanced over a standard glide wire into left cervical ICA, with luminal position achieved of the guide catheter. Wire was removed and angiogram was performed of the left carotid artery. Initial angiogram revealed no intimal/luminal irregularity and we proceeded with placement of the balloon guide into the distal cervical ICA. The balloon guide and the parents teen catheter were advanced with a Glidewire into the distal cervical segment of the ICA. Glidewire and the Berenstein catheter were gently removed and double flush was performed. Formal angiogram was performed. Road map function was used once the occluded vessel was identified. Copious back flush was performed and the balloon catheter was attached to heparinized and pressurized saline bag for forward flow. A second coaxial system was then advanced through the balloon catheter,  which included the selected intermediate catheter, microcatheter, and microwire. In this scenario, the set up included intermediate catheter, large-bore aspiration catheter, 135 cm, a Trevo Provue18 microcatheter, and 014 synchro soft wire. This system was advanced through the balloon guide catheter under the road-map function, with adequate back-flush at the rotating hemostatic valve at that back end of the balloon guide.  Microcatheter and the intermediate catheter system were advanced through the terminal ICA and MCA to the level of the occlusion. The micro wire was then carefully advanced through the occluded segment. Microcatheter was then manipulated through the occluded segment and the wire was removed with saline drip at the hub. Blood was then aspirated through the hub of the microcatheter, and a gentle contrast injection was performed confirming intraluminal position. A rotating hemostatic valve was then attached to the back end of the microcatheter, and a pressurized and heparinized saline bag was attached to the catheter. 4 x 40 solitaire device was then selected. Back flush was achieved at the rotating hemostatic valve, and then the device was gently advanced through the microcatheter to the distal end. The retriever was then unsheathed by withdrawing the microcatheter under fluoroscopy. Once the retriever was completely unsheathed, the microcatheter was carefully stripped from the delivery device. A 3 minute time interval was observed. The balloon at the balloon guide catheter was then inflated under fluoroscopy for proximal flow arrest. Constant aspiration using the proprietary engine was then performed at the intermediate catheter, as the retriever was gently and slowly withdrawn with fluoroscopic observation. Once the retriever was "corked" within the tip of the intermediate catheter, both were removed from the system. Free aspiration was confirmed at the hub of the balloon guide catheter, with free blood return confirmed. The balloon was then deflated, and a control angiogram was performed. The M1 segment remained occluded after the first pass. We then attempted a second pass. Microcatheter and the intermediate catheter system were again advanced through the terminal ICA and MCA to the level of the occlusion. The micro wire was then carefully advanced through the occluded segment. Microcatheter was then manipulated  through the occluded segment and the wire was removed with saline drip at the hub. Blood was then aspirated through the hub of the microcatheter, and a gentle contrast injection was performed confirming intraluminal position. A rotating hemostatic valve was then attached to the back end of the microcatheter, and a pressurized and heparinized saline bag was attached to the catheter. 4 x 40 solitaire device was again used. Back flush was achieved at the rotating hemostatic valve, and then the device was gently advanced through the microcatheter to the distal end. The retriever was then unsheathed by withdrawing the microcatheter under fluoroscopy. Once the retriever was completely unsheathed, the microcatheter was carefully stripped from the delivery device. Control angiogram was performed from the intermediate catheter, demonstrating that there was no flow through the open solitaire. A 3 minute time interval was observed. The balloon at the balloon guide catheter was then inflated under fluoroscopy for proximal flow arrest. Aspiration engine was turned on. Intermediate catheter was gently advanced over the proximal solitaire to the level of the occlusion, with flow arrest. Constant aspiration using the proprietary engine was then performed at the intermediate catheter, as both the retriever and the intermediate catheter were gently and slowly withdrawn with fluoroscopic observation. Once the retriever was "corked" within the tip of the intermediate catheter, both were removed from the system. Free aspiration was confirmed at the hub of the balloon guide  catheter, with free blood return confirmed. The balloon was then deflated, and a control angiogram was performed. Restoration of left MCA flow was confirmed. The initial left A2 segment occlusion was observed to have migrated distally to the pericallosal artery. Given the patient's young age we decided to proceed with attempt at mechanical thrombectomy of this ACA  embolus. ACA thrombectomy attempt: The walrus balloon catheter remained in the distal cervical segment, proximal to the horizontal petrous segment. We advanced a coaxial wire and catheter system which included 014 synchro soft wire, Zoom 35 intermediate catheter, and tree though pro view 18 microcatheter. This was advanced through the cavernous segment to the supraclinoid segment. This catheter and wire combination failed to engage the angulated A1 origin. We then attempted a coaxial system which included CT 5 intermediate catheter, 150cm phenom 21 microcatheter, and synchro soft wire. This coaxial system was advanced through the cavernous segment, supraclinoid segment, and failed to engage the A1 segment. Various micro wires were then used including Aristotle wire, and double angle headliner wire. The 014 headliner wire was ultimately successful engaging the A1 segment, with a pericallosal position achieved. The intermediate catheter would not make the nearly 180 degree turn into the A1 segment. After several attempts at achieving n/a 1 position with the intermediate catheter, we decided that we could attempt to use an anchoring technique by deploying stent tree verb across the embolism, and then bringing the intermediate catheter over the microcatheter and stent deployed stent tree verb. This would require, however, passing through the embolus with the microcatheter to deployed the stent tree verge. The microcatheter and microwire combination would not pass gently through the thromboembolism within the pericallosal artery, with quite a dense character observed when the wire and microcatheter were at the face of the thrombus. Ultimately we decided to withdraw from this attempt given the difficulty, and the apparent reactivity of the patient's carotid artery as we observed spasm and irregularity at the skull base on each subsequent control angiogram. The microcatheter was withdrawn to the cavernous segment of the  ICA. Wire was removed. Exchange length floppy tip transcend wire was then placed through the microcatheter into a safe position. The balloon guide catheter was then withdrawn into the common carotid artery and angiogram was performed. This revealed irregularity and narrowing in the proximal ICA, which continued with multiple variation in diameter through the cervical ICA to the distal segment at the site of the prior inflated balloon. These findings were discussed with the neurology team, with concern for dissection, as there was irregularity at this segment on the preprocedural CT scan, indicating possible vasculitis/dissection as not only the current imaging diagnosis, but potentially site of the thromboembolism origin. At this point we performed flat panel CT scan to observe for ICH/S AH, as treatment with a stent would require dual anti-platelet therapy. Flat panel CT demonstrated thin subarachnoid hemorrhage in the inter hemispheric fissure. Interval angiogram of the ICA was performed to determine the need for any possible stenting. 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 This imaging finding was concerning for progression of a possible intramural hematoma/dissection and at this point we elected to move forward with stenting. At least 20 minutes were required to place an orogastric tube and then deliver our prescribed dose of aspirin and Plavix. Once these events had occurred, repeat angiogram was performed. Interestingly this image revealed significant improvement at the cervical segment, with residual irregularity (image XA #24). At this  point we interpreted this as either improving vaso spasm or improving sequela of vasculitis/dissection. Given the flat panel head CT findings and the desire to avoid dual anti-platelet therapy long-term, we elected to withdraw from the case at this point with no stent deployed. Balloon guide was then  removed, with all catheters and wires removed. The skin at the puncture site was then cleaned with Chlorhexidine. The 8 French sheath was removed and an 108F angioseal was deployed. Patient tolerated the procedure well and remained hemodynamically stable throughout. Blood loss estimated at 150 cc. IMPRESSION: Status post ultrasound guided access right common femoral artery for cervical/cerebral angiogram and mechanical thrombectomy of left MCA restoring complete flow to the left MCA territory, and failed attempt at revascularization of left pericallosal thromboembolism. Flat panel CT at the completion demonstrates thin subarachnoid hemorrhage in the anterior inter hemispheric fissure. Angio-Seal deployed for hemostasis. Signed, Yvone Neu. Reyne Dumas, RPVI [facility]  [facility] Radiology PLAN: The patient will remain intubated, as she remains COVID status unknown ICU status Target systolic blood pressure of 140-160 Right hip straight time 6 hours Frequent neurovascular checks Repeat neurologic imaging with CT and/MRI at the discretion of neurology team Electronically Signed   By: Gilmer Mor D.O.   On: 05/21/2020 08:57   DG Chest Port 1 View  Result Date: 05/31/2020 CLINICAL DATA:  Sob,code stroke ,COVID positive EXAM: PORTABLE CHEST 1 VIEW COMPARISON:  Chest radiograph 05/28/2020 FINDINGS: Stable cardiomediastinal contours with enlarged heart size. Minimal opacities at the right lung base likely reflecting atelectasis. Left lung is clear. No pneumothorax or significant pleural effusion. No acute finding in the visualized skeleton. IMPRESSION: Minimal opacities at the right lung base likely reflecting atelectasis. Electronically Signed   By: Emmaline Kluver M.D.   On: 05/31/2020 09:08   DG CHEST PORT 1 VIEW  Result Date: 05/28/2020 CLINICAL DATA:  Fever.  COVID positive. EXAM: PORTABLE CHEST 1 VIEW COMPARISON:  05/22/2020 FINDINGS: Midline trachea.  Normal heart size and  mediastinal contours. Sharp costophrenic angles. No pneumothorax. Clear lungs. Mild right hemidiaphragm elevation. IMPRESSION: No active disease. Electronically Signed   By: Jeronimo Greaves M.D.   On: 05/28/2020 08:14   DG CHEST PORT 1 VIEW  Result Date: 05/22/2020 CLINICAL DATA:  COVID-19 positive. EXAM: PORTABLE CHEST 1 VIEW COMPARISON:  May 20, 2020. FINDINGS: Stable cardiomediastinal silhouette. Endotracheal and nasogastric tubes have been removed. No pneumothorax or pleural effusion is noted. Lungs are clear. Bony thorax is unremarkable. IMPRESSION: No active disease. Electronically Signed   By: Lupita Raider M.D.   On: 05/22/2020 08:36   DG CHEST PORT 1 VIEW  Result Date: 05/20/2020 CLINICAL DATA:  CABG 19 positive, respiratory failure EXAM: PORTABLE CHEST 1 VIEW COMPARISON:  05/19/2020 chest radiograph. FINDINGS: Endotracheal tube tip is 4.1 cm above the carina. Enteric tube terminates in the gastric fundus. Stable cardiomediastinal silhouette with normal heart size. No pneumothorax. No pleural effusion. Mild platelike atelectasis in the mid to lower lungs. No pulmonary edema. IMPRESSION: 1. Well-positioned support structures. 2. Mild platelike atelectasis in the mid to lower lungs. Electronically Signed   By: Delbert Phenix M.D.   On: 05/20/2020 09:09   DG CHEST PORT 1 VIEW  Result Date: 05/19/2020 CLINICAL DATA:  LEFT MCA infarct and intracranial hemorrhage. Respiratory failure. COVID positive. EXAM: PORTABLE CHEST 1 VIEW COMPARISON:  11/07/2016 chest radiograph FINDINGS: An endotracheal tube is noted with tip 3 cm above the carina. An NG tube is noted coiled in the stomach. This is  a mildly low volume film with mild bibasilar opacities which may represent atelectasis or airspace disease. No pleural effusion or pneumothorax. No acute bony abnormalities are present. IMPRESSION: 1. Endotracheal tube and NG tube placement. 2. Mildly low volume film with mild bibasilar opacities which may represent  atelectasis or infection/pneumonia. Electronically Signed   By: Harmon Pier M.D.   On: 05/19/2020 18:03   DG Swallowing Func-Speech Pathology  Result Date: 05/22/2020 Objective Swallowing Evaluation: Type of Study: MBS-Modified Barium Swallow Study  Patient Details Name: Honestii Marton MRN: [id number] Date of Birth: 1996/06/27 Today's Date: 05/22/2020 Time: SLP Start Time (ACUTE ONLY): 1449 -SLP Stop Time (ACUTE ONLY): 1509 SLP Time Calculation (min) (ACUTE ONLY): 20 min Past Medical History: Past Medical History: Diagnosis Date . Anxiety  . Panic attack  Past Surgical History: Past Surgical History: Procedure Laterality Date . IR CT HEAD LTD  05/19/2020 . IR CT HEAD LTD  05/19/2020 . IR PERCUTANEOUS ART THROMBECTOMY/INFUSION INTRACRANIAL INC DIAG ANGIO  05/19/2020 . IR US GUIDE VASC ACCESS RIGHT  05/19/2020 . RADIOLOGY WITH ANESTHESIA N/A 05/19/2020  Procedure: IR WITH ANESTHESIA;  Surgeon: Radiologist, Medication, MD;  Location: [facility] OR;  Service: Radiology;  Laterality: N/A; HPI: Pt is a 24 yo female presenting with sudden onset R sided weakness and aphasia. 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 She was intubated for thrombectomy 8/21. 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 MRI showed early subacute infarct of the left basal ganglia involving the posterior putamen and caudate body. Pt self-extubated 8/22. Pt tested (+) for COVID-19 but was asymptomatic upon arrival. PMH: panic attack, anxiety  Subjective: alert, cooperative, aphasic Assessment / Plan / Recommendation CHL IP CLINICAL IMPRESSIONS 05/22/2020 Clinical Impression Pt presents with a mild oral dysphagia, but with relatively intact pharyngeal function. She has reduced labial seal with anterior spillage that happens with thin liquids via cup and straw, and is not necessarily localized to one side of her mouth. She has lingual pumping, with more time needed to clear solids from her oral cavity compared to liquids. Lingual pumping  with solids is sometimes moderately prolonged, and she is not able to clear the barium tablet before losing it sublingually. After she is able to retrieve it, she masticated it rather than trying again to swallow it whole. There is some premature spillage with thin liquids, at times filling the valleculae and pyriform sinuses and with varying amounts of time that passes before she swallows, dependent upon how much longer it takes her to clear her mouth. Despite this, there is no aspiration across challenging. She had a few instances of trace and transient penetration. Throat clearing was not noted with each episode of transient penetration, but was also not observed outside of these episodes. Recommend that pt begin Dys 3 (mechanical soft) diet and thin liquids.  SLP Visit Diagnosis Dysphagia, oral phase (R13.11) Attention and concentration deficit following -- Frontal lobe and executive function deficit following -- Impact on safety and function Mild aspiration risk   CHL IP TREATMENT RECOMMENDATION 05/22/2020 Treatment Recommendations Therapy as outlined in treatment plan below   Prognosis 05/22/2020 Prognosis for Safe Diet Advancement Good Barriers to Reach Goals Language deficits Barriers/Prognosis Comment -- CHL IP DIET RECOMMENDATION 05/22/2020 SLP Diet Recommendations Dysphagia 3 (Mech soft) solids;Thin liquid Liquid Administration via Straw;Cup Medication Administration Whole meds with puree Compensations Slow rate;Small sips/bites Postural Changes Seated upright at 90 degrees   CHL IP OTHER RECOMMENDATIONS 05/22/2020 Recommended Consults -- Oral Care  Recommendations Oral care BID Other Recommendations --   CHL IP FOLLOW UP RECOMMENDATIONS 05/22/2020 Follow up Recommendations Inpatient Rehab   CHL IP FREQUENCY AND DURATION 05/22/2020 Speech Therapy Frequency (ACUTE ONLY) min 2x/week Treatment Duration 2 weeks      CHL IP ORAL PHASE 05/22/2020 Oral Phase Impaired Oral - Pudding Teaspoon -- Oral - Pudding Cup -- Oral  - Honey Teaspoon -- Oral - Honey Cup -- Oral - Nectar Teaspoon -- Oral - Nectar Cup -- Oral - Nectar Straw -- Oral - Thin Teaspoon -- Oral - Thin Cup Left anterior bolus loss;Right anterior bolus loss;Lingual pumping;Reduced posterior propulsion;Delayed oral transit;Decreased bolus cohesion;Premature spillage Oral - Thin Straw Left anterior bolus loss;Right anterior bolus loss;Lingual pumping;Reduced posterior propulsion;Delayed oral transit;Decreased bolus cohesion;Premature spillage Oral - Puree Left anterior bolus loss;Right anterior bolus loss;Lingual pumping;Reduced posterior propulsion;Delayed oral transit;Decreased bolus cohesion Oral - Mech Soft Left anterior bolus loss;Right anterior bolus loss;Lingual pumping;Reduced posterior propulsion;Delayed oral transit;Decreased bolus cohesion;Lingual/palatal residue Oral - Regular -- Oral - Multi-Consistency -- Oral - Pill Left anterior bolus loss;Right anterior bolus loss;Lingual pumping;Reduced posterior propulsion;Delayed oral transit;Pocketing in anterior sulcus Oral Phase - Comment --  CHL IP PHARYNGEAL PHASE 05/22/2020 Pharyngeal Phase Impaired Pharyngeal- Pudding Teaspoon -- Pharyngeal -- Pharyngeal- Pudding Cup -- Pharyngeal -- Pharyngeal- Honey Teaspoon -- Pharyngeal -- Pharyngeal- Honey Cup -- Pharyngeal -- Pharyngeal- Nectar Teaspoon -- Pharyngeal -- Pharyngeal- Nectar Cup -- Pharyngeal -- Pharyngeal- Nectar Straw -- Pharyngeal -- Pharyngeal- Thin Teaspoon -- Pharyngeal -- Pharyngeal- Thin Cup Penetration/Aspiration before swallow Pharyngeal Material enters airway, remains ABOVE vocal cords then ejected out Pharyngeal- Thin Straw Penetration/Aspiration before swallow Pharyngeal Material enters airway, remains ABOVE vocal cords then ejected out Pharyngeal- Puree WFL Pharyngeal -- Pharyngeal- Mechanical Soft WFL Pharyngeal -- Pharyngeal- Regular -- Pharyngeal -- Pharyngeal- Multi-consistency -- Pharyngeal -- Pharyngeal- Pill WFL Pharyngeal -- Pharyngeal  Comment --  CHL IP CERVICAL ESOPHAGEAL PHASE 05/22/2020 Cervical Esophageal Phase WFL Pudding Teaspoon -- Pudding Cup -- Honey Teaspoon -- Honey Cup -- Nectar Teaspoon -- Nectar Cup -- Nectar Straw -- Thin Teaspoon -- Thin Cup -- Thin Straw -- Puree -- Mechanical Soft -- Regular -- Multi-consistency -- Pill -- Cervical Esophageal Comment -- Gail Montgomery., M.A. CCC-SLP Acute Rehabilitation Services Pager [phone] Office [phone] 05/22/2020, 3:28 PM              VAS [place] TRANSCRANIAL DOPPLER W BUBBLES  Result Date: 05/28/2020  Transcranial Doppler with Bubble Indications: Stroke. Comparison Study: No prior study Performing Technologist: Gertie Fey MHA, RDMS, RVT, RDCS  Examination Guidelines: A complete evaluation includes B-mode imaging, spectral Doppler, color Doppler, and power Doppler as needed of all accessible portions of each vessel. Bilateral testing is considered an integral part of a complete examination. Limited examinations for reoccurring indications may be performed as noted.  Summary:  A vascular evaluation was performed. The left middle cerebral artery was studied. An IV was inserted into the patient's left PICC line. Verbal informed consent was obtained.  HITS (high intensity transient signals) were heard at rest, suggestive of a possible Spencer Grade 3 PFO. 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  Diagnosing physician: Delia Heady MD Electronically signed by Delia Heady MD on 05/28/2020 at 1:43:29 PM.    Final    ECHOCARDIOGRAM COMPLETE  Result Date: 05/20/2020    ECHOCARDIOGRAM REPORT   Patient Name:   SHAREA GUINTHER Date of Exam: 05/20/2020 Medical Rec #:  [id number]  Height:       64.0 in Accession #:    [id number]   Weight:       185.6 lb Date of Birth:  Aug 06, 1996    BSA:          1.895 m Patient Age:    24 years     BP:           127/71 mmHg Patient Gender: F            HR:            64 bpm. Exam Location:  Inpatient Procedure: 2D Echo Indications:    Stroke  History:        Patient has no prior history of Echocardiogram examinations.                 Risk Factors:Current Smoker. Covid Positive.  Sonographer:    Jeryl Columbia Referring Phys: [phone] MCNEILL P KIRKPATRICK IMPRESSIONS  1. Left ventricular ejection fraction, by estimation, is 60 to 65%. The left ventricle has normal function. The left ventricle has no regional wall motion abnormalities. Left ventricular diastolic parameters were normal.  2. Right ventricular systolic function is normal. The right ventricular size is normal.  3. The mitral valve is normal in structure. Trivial mitral valve regurgitation. No evidence of mitral stenosis.  4. The aortic valve is normal in structure. Aortic valve regurgitation is not visualized. No aortic stenosis is present.  5. The inferior vena cava is normal in size with greater than 50% respiratory variability, suggesting right atrial pressure of 3 mmHg. Comparison(s): No prior Echocardiogram. Conclusion(s)/Recommendation(s): Normal biventricular function without evidence of hemodynamically significant valvular heart disease. No intracardiac source of embolism detected on this transthoracic study. A transesophageal echocardiogram is recommended to exclude cardiac source of embolism if clinically indicated. FINDINGS  Left Ventricle: Left ventricular ejection fraction, by estimation, is 60 to 65%. The left ventricle has normal function. The left ventricle has no regional wall motion abnormalities. The left ventricular internal cavity size was normal in size. There is  no left ventricular hypertrophy. Left ventricular diastolic parameters were normal. Right Ventricle: The right ventricular size is normal. No increase in right ventricular wall thickness. Right ventricular systolic function is normal. Left Atrium: Left atrial size was normal in size. Right Atrium: Right atrial size was normal in size.  Pericardium: There is no evidence of pericardial effusion. Mitral Valve: The mitral valve is normal in structure. Normal mobility of the mitral valve leaflets. Trivial mitral valve regurgitation. No evidence of mitral valve stenosis. Tricuspid Valve: The tricuspid valve is normal in structure. Tricuspid valve regurgitation is trivial. No evidence of tricuspid stenosis. Aortic Valve: The aortic valve is normal in structure. Aortic valve regurgitation is not visualized. No aortic stenosis is present. Pulmonic Valve: The pulmonic valve was normal in structure. Pulmonic valve regurgitation is not visualized. No evidence of pulmonic stenosis. Aorta: The aortic root is normal in size and structure. Venous: The inferior vena cava is normal in size with greater than 50% respiratory variability, suggesting right atrial pressure of 3 mmHg. IAS/Shunts: No atrial level shunt detected by color flow Doppler.  LEFT VENTRICLE PLAX 2D LVIDd:         4.00 cm LVIDs:         2.60 cm LV PW:         1.20 cm LV IVS:        1.00 cm LVOT diam:     1.90 cm LVOT Area:  2.84 cm  RIGHT VENTRICLE TAPSE (M-mode): 2.6 cm LEFT ATRIUM             Index       RIGHT ATRIUM           Index LA diam:        3.00 cm 1.58 cm/m  RA Area:     14.30 cm LA Vol (A2C):   52.4 ml 27.65 ml/m RA Volume:   36.50 ml  19.26 ml/m LA Vol (A4C):   38.2 ml 20.15 ml/m LA Biplane Vol: 44.9 ml 23.69 ml/m   AORTA Ao Root diam: 2.70 cm  SHUNTS Systemic Diam: 1.90 cm Gail Alexander MD Electronically signed by Gail Alexander MD Signature Date/Time: 05/20/2020/4:13:45 PM    Final    IR PERCUTANEOUS ART THROMBECTOMY/INFUSION INTRACRANIAL INC DIAG ANGIO  Result Date: 05/21/2020 INDICATION: 23 year old female with a history of acute left MCA syndrome, presents for mechanical thrombectomy COVID test was confirmed positive after initiation of the case EXAM: ULTRASOUND-GUIDED RIGHT COMMON FEMORAL ARTERY ACCESS CERVICAL AND CEREBRAL ANGIOGRAM MECHANICAL THROMBECTOMY LEFT  MCA ANGIO-SEAL FOR HEMOSTASIS COMPARISON:  CT imaging same day MEDICATIONS: 200 mcg intra arterial nitroglycerin. ANESTHESIA/SEDATION: The anesthesia team was present to provide general endotracheal tube anesthesia and for patient monitoring during the procedure. Intubation was performed in negative pressure [place] in neuro IR holding. Left radial arterial line was performed by the anesthesia team. Interventional neuro radiology nursing staff was also present. CONTRAST:  150 cc FLUOROSCOPY TIME:  Fluoroscopy Time: 53 minutes 36 seconds (1651 mGy). COMPLICATIONS: SIR LEVEL C - Requires therapy, minor hospitalization (<48 hrs). Subarachnoid hemorrhage TECHNIQUE: Informed written consent was obtained from the patient's family after a thorough discussion of the procedural risks, benefits and alternatives. Specific risks discussed include: Bleeding, infection, contrast reaction, kidney injury/failure, need for further procedure/surgery, arterial injury or dissection, embolization to new territory, intracranial hemorrhage (10-15% risk), neurologic deterioration, cardiopulmonary collapse, death. All questions were addressed. Maximal Sterile Barrier Technique was utilized including during the procedure including caps, mask, sterile gowns, sterile gloves, sterile drape, hand hygiene and skin antiseptic. A timeout was performed prior to the initiation of the procedure. The anesthesia team was present to provide general endotracheal tube anesthesia and for patient monitoring during the procedure. Interventional neuro radiology nursing staff was also present. FINDINGS: Initial Findings: Left common carotid artery: ICA patent with no tortuosity and no significant atherosclerosis. Separate branch from the aortic arch. Left external carotid artery: Patent with antegrade flow. Left internal carotid artery: The common carotid artery is relatively straight with no beading or significant narrowing. The AP image on the initial image  demonstrates perhaps a slight irregularity on the lateral intimal surface which may correspond to the coronal reformatted images on the comparison CT preprocedure (image 25 of series 9). Given that there was no flow limiting problem, we elected to proceed with the thrombectomy Left MCA: The vertical horizontal petrous segment patent. Segment lacerum patent. Cavernous and supraclinoid patent. Occlusion of the proximal M1 segment with visible filling defect compatible with the findings on the CT. Left ACA: A1 is patent. There is a nearly 180 degree angulation of the A1 origin. Patent anterior communicating artery. There has been migration of the thromboembolism through the terminal segment into the A2 segment compared to the prior CT. Procedural findings: Baseline MCA flow: TICI 0 Left MCA: After 1 pass, there is no significant restoration of flow. Upon placement of the stent tree bronch the second attempt, and angiogram demonstrated no significant flow through  the solitaire. After the second pass, complete restoration of flow through the MCA territory was achieved, TICI 3. During this time, there was further migration of the A2 embolus to the pericallosal artery, with the ultimate occlusion approximately level to the coronal suture. Given that the anatomy would not allow and navigation of the microcatheter and intermediate catheter into the segment, and given the perceived density of the ACA thromboembolism, we ultimately withdrew from further attempts thrombectomy when the wire and microcatheter would not pass through the face of the thrombus. Final angiogram demonstrated no evidence of extravasation of contrast at this level. The flat panel CT demonstrates inter hemispheric subarachnoid hemorrhage without mass effect. Angiogram of the cervical ICA upon withdrawal of the balloon catheter demonstrated long segment irregularity, with variations in the diameter extending from beyond the bifurcation to the horizontal  petrous segment. The irregularity was not only spatial but variations in temporal appearance, as the narrowing worsen to greater than 80%, such as that demonstrated on image 5/7 angiogram 23. After 20 minutes of placing orogastric tube and giving dual antiplatelets dosage, anticipating stenting, a repeat angiogram demonstrated improvement in the appearance of the diameter, image 5/7 of angiogram 24. At this time we elected not to stent. PROCEDURE: The anesthesia team was present to provide general endotracheal tube anesthesia and for patient monitoring during the procedure. Intubation was performed in negative pressure [place] in neuro IR holding. Interventional neuro radiology nursing staff was also present. Ultrasound survey of the right inguinal region was performed with images stored and sent to PACs. 11 blade scalpel was used to make a small incision. Blunt dissection was performed with US guidance. A micropuncture needle was used access the right common femoral artery under ultrasound. With excellent arterial blood flow returned, an .018 micro wire was passed through the needle, observed to enter the abdominal aorta under fluoroscopy. The needle was removed, and a micropuncture sheath was placed over the wire. The inner dilator and wire were removed, and an 035 wire was advanced under fluoroscopy into the abdominal aorta. The sheath was removed and a 25cm 35F straight vascular sheath was placed. The dilator was removed and the sheath was flushed. Sheath was attached to pressurized and heparinized saline bag for constant forward flow. A coaxial system was then advanced over the 035 wire. This included a 95cm 087 "Walrus" balloon guide with coaxial 125cm Berenstein diagnostic catheter. This was advanced to the proximal descending thoracic aorta. Wire was then removed. Double flush of the catheter was performed. Catheter was then used to select the left common carotid artery. Angiogram was performed. Using roadmap  technique, the catheter was advanced over a standard glide wire into left cervical ICA, with luminal position achieved of the guide catheter. Wire was removed and angiogram was performed of the left carotid artery. Initial angiogram revealed no intimal/luminal irregularity and we proceeded with placement of the balloon guide into the distal cervical ICA. The balloon guide and the parents teen catheter were advanced with a Glidewire into the distal cervical segment of the ICA. Glidewire and the Berenstein catheter were gently removed and double flush was performed. Formal angiogram was performed. Road map function was used once the occluded vessel was identified. Copious back flush was performed and the balloon catheter was attached to heparinized and pressurized saline bag for forward flow. A second coaxial system was then advanced through the balloon catheter, which included the selected intermediate catheter, microcatheter, and microwire. In this scenario, the set up included intermediate catheter, large-bore  aspiration catheter, 135 cm, a Trevo Provue18 microcatheter, and 014 synchro soft wire. This system was advanced through the balloon guide catheter under the road-map function, with adequate back-flush at the rotating hemostatic valve at that back end of the balloon guide. Microcatheter and the intermediate catheter system were advanced through the terminal ICA and MCA to the level of the occlusion. The micro wire was then carefully advanced through the occluded segment. Microcatheter was then manipulated through the occluded segment and the wire was removed with saline drip at the hub. Blood was then aspirated through the hub of the microcatheter, and a gentle contrast injection was performed confirming intraluminal position. A rotating hemostatic valve was then attached to the back end of the microcatheter, and a pressurized and heparinized saline bag was attached to the catheter. 4 x 40 solitaire device  was then selected. Back flush was achieved at the rotating hemostatic valve, and then the device was gently advanced through the microcatheter to the distal end. The retriever was then unsheathed by withdrawing the microcatheter under fluoroscopy. Once the retriever was completely unsheathed, the microcatheter was carefully stripped from the delivery device. A 3 minute time interval was observed. The balloon at the balloon guide catheter was then inflated under fluoroscopy for proximal flow arrest. Constant aspiration using the proprietary engine was then performed at the intermediate catheter, as the retriever was gently and slowly withdrawn with fluoroscopic observation. Once the retriever was "corked" within the tip of the intermediate catheter, both were removed from the system. Free aspiration was confirmed at the hub of the balloon guide catheter, with free blood return confirmed. The balloon was then deflated, and a control angiogram was performed. The M1 segment remained occluded after the first pass. We then attempted a second pass. Microcatheter and the intermediate catheter system were again advanced through the terminal ICA and MCA to the level of the occlusion. The micro wire was then carefully advanced through the occluded segment. Microcatheter was then manipulated through the occluded segment and the wire was removed with saline drip at the hub. Blood was then aspirated through the hub of the microcatheter, and a gentle contrast injection was performed confirming intraluminal position. A rotating hemostatic valve was then attached to the back end of the microcatheter, and a pressurized and heparinized saline bag was attached to the catheter. 4 x 40 solitaire device was again used. Back flush was achieved at the rotating hemostatic valve, and then the device was gently advanced through the microcatheter to the distal end. The retriever was then unsheathed by withdrawing the microcatheter under  fluoroscopy. Once the retriever was completely unsheathed, the microcatheter was carefully stripped from the delivery device. Control angiogram was performed from the intermediate catheter, demonstrating that there was no flow through the open solitaire. A 3 minute time interval was observed. The balloon at the balloon guide catheter was then inflated under fluoroscopy for proximal flow arrest. Aspiration engine was turned on. Intermediate catheter was gently advanced over the proximal solitaire to the level of the occlusion, with flow arrest. Constant aspiration using the proprietary engine was then performed at the intermediate catheter, as both the retriever and the intermediate catheter were gently and slowly withdrawn with fluoroscopic observation. Once the retriever was "corked" within the tip of the intermediate catheter, both were removed from the system. Free aspiration was confirmed at the hub of the balloon guide catheter, with free blood return confirmed. The balloon was then deflated, and a control angiogram was performed. Restoration of  left MCA flow was confirmed. The initial left A2 segment occlusion was observed to have migrated distally to the pericallosal artery. Given the patient's young age we decided to proceed with attempt at mechanical thrombectomy of this ACA embolus. ACA thrombectomy attempt: The walrus balloon catheter remained in the distal cervical segment, proximal to the horizontal petrous segment. We advanced a coaxial wire and catheter system which included 014 synchro soft wire, Zoom 35 intermediate catheter, and tree though pro view 18 microcatheter. This was advanced through the cavernous segment to the supraclinoid segment. This catheter and wire combination failed to engage the angulated A1 origin. We then attempted a coaxial system which included CT 5 intermediate catheter, 150cm phenom 21 microcatheter, and synchro soft wire. This coaxial system was advanced through the  cavernous segment, supraclinoid segment, and failed to engage the A1 segment. Various micro wires were then used including Aristotle wire, and double angle headliner wire. The 014 headliner wire was ultimately successful engaging the A1 segment, with a pericallosal position achieved. The intermediate catheter would not make the nearly 180 degree turn into the A1 segment. After several attempts at achieving n/a 1 position with the intermediate catheter, we decided that we could attempt to use an anchoring technique by deploying stent tree verb across the embolism, and then bringing the intermediate catheter over the microcatheter and stent deployed stent tree verb. This would require, however, passing through the embolus with the microcatheter to deployed the stent tree verge. The microcatheter and microwire combination would not pass gently through the thromboembolism within the pericallosal artery, with quite a dense character observed when the wire and microcatheter were at the face of the thrombus. Ultimately we decided to withdraw from this attempt given the difficulty, and the apparent reactivity of the patient's carotid artery as we observed spasm and irregularity at the skull base on each subsequent control angiogram. The microcatheter was withdrawn to the cavernous segment of the ICA. Wire was removed. Exchange length floppy tip transcend wire was then placed through the microcatheter into a safe position. The balloon guide catheter was then withdrawn into the common carotid artery and angiogram was performed. This revealed irregularity and narrowing in the proximal ICA, which continued with multiple variation in diameter through the cervical ICA to the distal segment at the site of the prior inflated balloon. These findings were discussed with the neurology team, with concern for dissection, as there was irregularity at this segment on the preprocedural CT scan, indicating possible vasculitis/dissection as  not only the current imaging diagnosis, but potentially site of the thromboembolism origin. At this point we performed flat panel CT scan to observe for ICH/S AH, as treatment with a stent would require dual anti-platelet therapy. Flat panel CT demonstrated thin subarachnoid hemorrhage in the inter hemispheric fissure. Interval angiogram of the ICA was performed to determine the need for any possible stenting. 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 This imaging finding was concerning for progression of a possible intramural hematoma/dissection and at this point we elected to move forward with stenting. At least 20 minutes were required to place an orogastric tube and then deliver our prescribed dose of aspirin and Plavix. Once these events had occurred, repeat angiogram was performed. Interestingly this image revealed significant improvement at the cervical segment, with residual irregularity (image XA #24). At this point we interpreted this as either improving vaso spasm or improving sequela of vasculitis/dissection. Given the flat panel head  CT findings and the desire to avoid dual anti-platelet therapy long-term, we elected to withdraw from the case at this point with no stent deployed. Balloon guide was then removed, with all catheters and wires removed. The skin at the puncture site was then cleaned with Chlorhexidine. The 8 French sheath was removed and an 32F angioseal was deployed. Patient tolerated the procedure well and remained hemodynamically stable throughout. Blood loss estimated at 150 cc. IMPRESSION: Status post ultrasound guided access right common femoral artery for cervical/cerebral angiogram and mechanical thrombectomy of left MCA restoring complete flow to the left MCA territory, and failed attempt at revascularization of left pericallosal thromboembolism. Flat panel CT at the completion demonstrates thin subarachnoid  hemorrhage in the anterior inter hemispheric fissure. Angio-Seal deployed for hemostasis. Signed, Yvone Neu. Reyne Dumas, RPVI [facility] Radiology PLAN: The patient will remain intubated, as she remains COVID status unknown ICU status Target systolic blood pressure of 140-160 Right hip straight time 6 hours Frequent neurovascular checks Repeat neurologic imaging with CT and/MRI at the discretion of neurology team Electronically Signed   By: Gilmer Mor D.O.   On: 05/21/2020 08:57   CT HEAD CODE STROKE WO CONTRAST  Result Date: 05/19/2020 CLINICAL DATA:  Code stroke.  Stroke suspected. EXAM: CT HEAD WITHOUT CONTRAST TECHNIQUE: Contiguous axial images were obtained from the base of the skull through the vertex without intravenous contrast. COMPARISON:  None. FINDINGS: Brain: No evidence of acute infarction, hemorrhage, hydrocephalus, extra-axial collection or mass lesion/mass effect. Vascular: Dense left ICA terminus and MCA, extensive in compatible with thrombosis in this setting and assuming referable symptoms. CTA is underway. Skull: Negative Sinuses/Orbits: Negative Other: These results were communicated to Dr. Amada Jupiter at 11:25 amon 8/21/2021by text page via the [facility] messaging system. ASPECTS [facility] Stroke Program Early CT Score) - Ganglionic level infarction (caudate, lentiform nuclei, internal capsule, insula, M1-M3 cortex): 7 - Supraganglionic infarction (M4-M6 cortex): 3 Total score (0-10 with 10 being normal): 10 IMPRESSION: Dense left ICA terminus and MCA. No acute hemorrhage. ASPECTS is 10. Electronically Signed   By: Marnee Spring M.D.   On: 05/19/2020 11:27   VAS [place] LOWER EXTREMITY VENOUS (DVT)  Result Date: 05/29/2020  Lower Venous DVTStudy Indications: Fever. SARS-CoV-2 positive, and pulmonary embolism.  Comparison Study: 05/21/2020- negative bilateral lower extremity venous duplex Performing Technologist: Gertie Fey MHA, RDMS,  RVT, RDCS  Examination Guidelines: A complete evaluation includes B-mode imaging, spectral Doppler, color Doppler, and power Doppler as needed of all accessible portions of each vessel. Bilateral testing is considered an integral part of a complete examination. Limited examinations for reoccurring indications may be performed as noted. The reflux portion of the exam is performed with the patient in reverse Trendelenburg.  +---------+---------------+---------+-----------+----------+--------------+ RIGHT    CompressibilityPhasicitySpontaneityPropertiesThrombus Aging +---------+---------------+---------+-----------+----------+--------------+ CFV      Full           Yes      Yes                                 +---------+---------------+---------+-----------+----------+--------------+ SFJ      Full                                                        +---------+---------------+---------+-----------+----------+--------------+ FV Prox  None                    No                   Acute          +---------+---------------+---------+-----------+----------+--------------+ FV Mid   None                                         Acute          +---------+---------------+---------+-----------+----------+--------------+ FV DistalNone                                         Acute          +---------+---------------+---------+-----------+----------+--------------+ PFV      Full                                                        +---------+---------------+---------+-----------+----------+--------------+ POP      None                    No                   Acute          +---------+---------------+---------+-----------+----------+--------------+ PTV      Full                    Yes                                 +---------+---------------+---------+-----------+----------+--------------+ PERO     Partial                 Yes                                  +---------+---------------+---------+-----------+----------+--------------+ Gastroc  None                    No                   Acute          +---------+---------------+---------+-----------+----------+--------------+   +---------+---------------+---------+-----------+----------+--------------+ LEFT     CompressibilityPhasicitySpontaneityPropertiesThrombus Aging +---------+---------------+---------+-----------+----------+--------------+ CFV      Full           Yes      Yes                                 +---------+---------------+---------+-----------+----------+--------------+ SFJ      Full                                                        +---------+---------------+---------+-----------+----------+--------------+ FV Prox  Full                                                        +---------+---------------+---------+-----------+----------+--------------+  FV Mid   Full                                                        +---------+---------------+---------+-----------+----------+--------------+ FV DistalFull                                                        +---------+---------------+---------+-----------+----------+--------------+ PFV      Full                                                        +---------+---------------+---------+-----------+----------+--------------+ POP      Full           Yes      Yes                                 +---------+---------------+---------+-----------+----------+--------------+ PTV      Full                                                        +---------+---------------+---------+-----------+----------+--------------+ PERO     Full                                                        +---------+---------------+---------+-----------+----------+--------------+     Summary: RIGHT: - Findings consistent with acute deep vein thrombosis involving the right femoral vein, right popliteal  vein, and right gastrocnemius veins. - No cystic structure found in the popliteal fossa.  LEFT: - There is no evidence of deep vein thrombosis in the lower extremity.  - No cystic structure found in the popliteal fossa.  *See table(s) above for measurements and observations. Electronically signed by Coral Else MD on 05/29/2020 at 8:52:33 PM.    Final    VAS [place] LOWER EXTREMITY VENOUS (DVT)  Result Date: 05/21/2020  Lower Venous DVTStudy Indications: Stroke.  Limitations: Bandages. Comparison Study: No prior studies. Performing Technologist: Jean Rosenthal  Examination Guidelines: A complete evaluation includes B-mode imaging, spectral Doppler, color Doppler, and power Doppler as needed of all accessible portions of each vessel. Bilateral testing is considered an integral part of a complete examination. Limited examinations for reoccurring indications may be performed as noted. The reflux portion of the exam is performed with the patient in reverse Trendelenburg.  +---------+---------------+---------+-----------+----------+-------------------+ RIGHT    CompressibilityPhasicitySpontaneityPropertiesThrombus Aging      +---------+---------------+---------+-----------+----------+-------------------+ CFV  Unable to visualize                                                       due to bandages     +---------+---------------+---------+-----------+----------+-------------------+ SFJ                                                   Unable to visualize                                                       due to bandages     +---------+---------------+---------+-----------+----------+-------------------+ FV Prox  Full                                                             +---------+---------------+---------+-----------+----------+-------------------+ FV Mid   Full                                                              +---------+---------------+---------+-----------+----------+-------------------+ FV DistalFull                                                             +---------+---------------+---------+-----------+----------+-------------------+ PFV      Full                                                             +---------+---------------+---------+-----------+----------+-------------------+ POP      Full           Yes      Yes                                      +---------+---------------+---------+-----------+----------+-------------------+ PTV      Full                                                             +---------+---------------+---------+-----------+----------+-------------------+ PERO     Full                                                             +---------+---------------+---------+-----------+----------+-------------------+   +---------+---------------+---------+-----------+----------+-------------------+  LEFT     CompressibilityPhasicitySpontaneityPropertiesThrombus Aging      +---------+---------------+---------+-----------+----------+-------------------+ CFV      Full           Yes      Yes                                      +---------+---------------+---------+-----------+----------+-------------------+ SFJ      Full                                                             +---------+---------------+---------+-----------+----------+-------------------+ FV Prox  Full                                                             +---------+---------------+---------+-----------+----------+-------------------+ FV Mid   Full           Yes      Yes                  Some segments not                                                         visualized due to                                                         bandages            +---------+---------------+---------+-----------+----------+-------------------+  FV DistalFull           Yes      Yes                                      +---------+---------------+---------+-----------+----------+-------------------+ PFV      Full                                                             +---------+---------------+---------+-----------+----------+-------------------+ POP      Full           Yes      Yes                                      +---------+---------------+---------+-----------+----------+-------------------+ PTV      Full                                                             +---------+---------------+---------+-----------+----------+-------------------+  PERO     Full                                                             +---------+---------------+---------+-----------+----------+-------------------+    Summary: RIGHT: - There is no evidence of deep vein thrombosis in the lower extremity. However, portions of this examination were limited- see technologist comments above.  - No cystic structure found in the popliteal fossa.  LEFT: - There is no evidence of deep vein thrombosis in the lower extremity. However, portions of this examination were limited- see technologist comments above.  - No cystic structure found in the popliteal fossa.  *See table(s) above for measurements and observations. Electronically signed by Fabienne Bruns MD on 05/21/2020 at 5:11:44 PM.    Final    ECHOCARDIOGRAM LIMITED  Result Date: 05/29/2020    ECHOCARDIOGRAM LIMITED REPORT   Patient Name:   MARIALUIZA CAR Date of Exam: 05/29/2020 Medical Rec #:  [id number]    Height:       64.0 in Accession #:    [id number]   Weight:       187.6 lb Date of Birth:  October 20, 1995    BSA:          1.904 m Patient Age:    24 years     BP:           119/77 mmHg Patient Gender: F            HR:           100 bpm. Exam Location:  Inpatient Procedure: Limited Echo Indications:    pulmonary embolism  History:        Patient has prior history of Echocardiogram  examinations, most                 recent 05/20/2020. Stroke; Risk Factors:Current Smoker.  Sonographer:    Celene Skeen RDCS (AE) Referring Phys: [phone] A CALDWELL POWELL JR  Sonographer Comments: poor patient compliance (see comments). restricted mobility IMPRESSIONS  1. Left ventricular ejection fraction, by estimation, is 60 to 65%. The left ventricle has normal function. The left ventricle has no regional wall motion abnormalities.  2. Right ventricular systolic function is normal. The right ventricular size is normal. Mildly D-shaped septum suggestive of RV pressure/volume overload.  3. The aortic valve is tricuspid. Aortic valve regurgitation is not visualized. No aortic stenosis is present.  4. The mitral valve is normal in structure. No evidence of mitral valve regurgitation. No evidence of mitral stenosis.  5. The inferior vena cava is normal in size with greater than 50% respiratory variability, suggesting right atrial pressure of 3 mmHg.  6. Limited echo. FINDINGS  Left Ventricle: Left ventricular ejection fraction, by estimation, is 60 to 65%. The left ventricle has normal function. The left ventricle has no regional wall motion abnormalities. The left ventricular internal cavity size was normal in size. There is  no left ventricular hypertrophy. Right Ventricle: The right ventricular size is normal. No increase in right ventricular wall thickness. Right ventricular systolic function is normal. Left Atrium: Left atrial size was normal in size. Right Atrium: Right atrial size was normal in size. Pericardium: Trivial pericardial effusion is present. Mitral Valve: The mitral valve is normal in structure. No evidence of mitral valve stenosis. Aortic Valve: The  aortic valve is tricuspid. Aortic valve regurgitation is not visualized. No aortic stenosis is present. Pulmonic Valve: The pulmonic valve was normal in structure. Pulmonic valve regurgitation is not visualized. Venous: The inferior vena cava is  normal in size with greater than 50% respiratory variability, suggesting right atrial pressure of 3 mmHg. IAS/Shunts: No atrial level shunt detected by color flow Doppler. Marca Ancona MD Electronically signed by Marca Ancona MD Signature Date/Time: 05/29/2020/2:55:34 PM    Final    [place] EKG SITE RITE  Result Date: 05/21/2020 If [facility] image not attached, placement could not be confirmed due to current cardiac rhythm.

## 2020-05-31 NOTE — Progress Notes (Signed)
Assisted tele visit to patient with family member.  Appollonia Klee P, RN  

## 2020-06-01 ENCOUNTER — Inpatient Hospital Stay (HOSPITAL_COMMUNITY): Payer: BC Managed Care – PPO

## 2020-06-01 DIAGNOSIS — R569 Unspecified convulsions: Secondary | ICD-10-CM

## 2020-06-01 LAB — PROTIME-INR
INR: 1.2 (ref 0.8–1.2)
Prothrombin Time: 14.6 seconds (ref 11.4–15.2)

## 2020-06-01 LAB — COMPREHENSIVE METABOLIC PANEL
ALT: 90 U/L — ABNORMAL HIGH (ref 0–44)
AST: 83 U/L — ABNORMAL HIGH (ref 15–41)
Albumin: 2.2 g/dL — ABNORMAL LOW (ref 3.5–5.0)
Alkaline Phosphatase: 46 U/L (ref 38–126)
Anion gap: 13 (ref 5–15)
BUN: 7 mg/dL (ref 6–20)
CO2: 21 mmol/L — ABNORMAL LOW (ref 22–32)
Calcium: 8.8 mg/dL — ABNORMAL LOW (ref 8.9–10.3)
Chloride: 97 mmol/L — ABNORMAL LOW (ref 98–111)
Creatinine, Ser: 0.58 mg/dL (ref 0.44–1.00)
GFR calc Af Amer: >60 mL/min (ref >60)
GFR calc non Af Amer: >60 mL/min (ref >60)
Glucose, Bld: 97 mg/dL (ref 70–99)
Potassium: 4.3 mmol/L (ref 3.5–5.1)
Sodium: 131 mmol/L — ABNORMAL LOW (ref 135–145)
Total Bilirubin: 0.4 mg/dL (ref 0.3–1.2)
Total Protein: 7.6 g/dL (ref 6.5–8.1)

## 2020-06-01 LAB — HEPARIN LEVEL (UNFRACTIONATED): Heparin Unfractionated: 0.38 [IU]/mL (ref 0.30–0.70)

## 2020-06-01 LAB — GLUCOSE, CAPILLARY: Glucose-Capillary: 90 mg/dL (ref 70–99)

## 2020-06-01 LAB — CBC
HCT: 29.5 % — ABNORMAL LOW (ref 36.0–46.0)
Hemoglobin: 9.8 g/dL — ABNORMAL LOW (ref 12.0–15.0)
MCH: 29.2 pg (ref 26.0–34.0)
MCHC: 33.2 g/dL (ref 30.0–36.0)
MCV: 87.8 fL (ref 80.0–100.0)
Platelets: 512 10*3/uL — ABNORMAL HIGH (ref 150–400)
RBC: 3.36 MIL/uL — ABNORMAL LOW (ref 3.87–5.11)
RDW: 12.1 % (ref 11.5–15.5)
WBC: 11.8 10*3/uL — ABNORMAL HIGH (ref 4.0–10.5)
nRBC: 0 % (ref 0.0–0.2)

## 2020-06-01 LAB — C-REACTIVE PROTEIN: CRP: 17.7 mg/dL — ABNORMAL HIGH (ref <1.0)

## 2020-06-01 LAB — MAGNESIUM: Magnesium: 2 mg/dL (ref 1.7–2.4)

## 2020-06-01 LAB — BRAIN NATRIURETIC PEPTIDE: B Natriuretic Peptide: 21.4 pg/mL (ref 0.0–100.0)

## 2020-06-01 LAB — D-DIMER, QUANTITATIVE: D-Dimer, Quant: 5.4 ug{FEU}/mL — ABNORMAL HIGH (ref 0.00–0.50)

## 2020-06-01 LAB — PROCALCITONIN: Procalcitonin: 0.18 ng/mL

## 2020-06-01 MED ORDER — ACETAMINOPHEN 500 MG PO TABS
500.0000 mg | ORAL_TABLET | Freq: Four times a day (QID) | ORAL | Status: DC | PRN
  Administered 2020-06-02 – 2020-06-06 (×7): 500 mg via ORAL
  Filled 2020-06-01 (×7): qty 1

## 2020-06-01 MED ORDER — APIXABAN 5 MG PO TABS
5.0000 mg | ORAL_TABLET | Freq: Two times a day (BID) | ORAL | Status: DC
  Administered 2020-06-01 – 2020-06-03 (×6): 5 mg via ORAL
  Filled 2020-06-01 (×6): qty 1

## 2020-06-01 NOTE — Progress Notes (Signed)
EEG complete - results pending 

## 2020-06-01 NOTE — Progress Notes (Signed)
ANTICOAGULATION CONSULT NOTE   Pharmacy Consult for Heparin Indication: 8/30 BL segmental PE   Allergies  Allergen Reactions  . 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    Patient Measurements: Height: 5\' 4"  (162.6 cm) Weight: 85.1 kg (187 lb 9.8 oz) IBW/kg (Calculated) : 54.7 Heparin Dosing Weight: 73kg  Vital Signs: Temp: 98.6 F (37 C) (09/03 0630) Temp Source: Rectal (09/03 0630) BP: 117/93 (09/03 0600) Pulse Rate: 105 (09/03 0400)  Labs: Recent Labs    05/30/20 0317 05/30/20 0317 05/30/20 1235 05/30/20 2038 05/31/20 0450 05/31/20 0828 06/01/20 0325  HGB 10.4*   < >  --   --  9.8*  --  9.8*  HCT 32.7*  --   --   --  30.5*  --  29.5*  PLT 187  --   --   --  412*  --  512*  LABPROT 13.6  --   --   --  13.7  --  14.6  INR 1.1  --   --   --  1.1  --  1.2  HEPARINUNFRC <0.10*  --    < > 0.37 0.41  --  0.38  CREATININE 0.57  --   --   --   --  0.51 0.58   < > = values in this interval not displayed.    Estimated Creatinine Clearance: 116.5 mL/min (by C-G formula based on SCr of 0.58 mg/dL).  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  8/30 LE doppler consistent with acute DVT in RLE.   Patient with persistent intracranial hemorrhage per 8/26 CT head. Pharmacy consulted to start heparin no bolus. Will target low end of goal as much as possible.  -heparin level= 0.38 on 1450 units/hr, therapeutic x3 levels now. CBC stable.   Goal of Therapy:  Heparin level 0.3- 0.5 units/ml Monitor platelets by anticoagulation protocol: Yes   Plan:  Continue heparin at 1450 units/hr Monitor daily CBC and heparin level Monitor for signs/symptoms of bleeding F/U plans to transition to Eliquis once in therapeutic range for at least 48 hours.   9/26, PharmD, BCPS,  BCCCP Clinical Pharmacist [phone]  Please check AMION for all [facility] numbers  06/01/2020 8:14 AM

## 2020-06-01 NOTE — Progress Notes (Signed)
    [facility] has been requested to perform a transesophageal echocardiogram on Gail Montgomery for CVA, PFO.  After careful review of history and examination, the risks and benefits of transesophageal echocardiogram have been explained including risks of esophageal damage, perforation (1:10,000 risk), bleeding, pharyngeal hematoma as well as other potential complications associated with conscious sedation including aspiration, arrhythmia, respiratory failure and death. Alternatives to treatment were discussed, questions were answered. Patient's parents - mother Arleatha Philipps and father are willing to proceed.  They understand they can call to ask questions at any time.    Nada Boozer, NP  06/01/2020 3:25 PM

## 2020-06-01 NOTE — Progress Notes (Addendum)
Occupational Therapy Treatment Patient Details Name: Gail Montgomery MRN: [id number] DOB: [birth year] Today's Date: 06/01/2020    History of present illness 24 yo who had acute R sided weakness and aphasia 05/19/20 due to LVO at [facility]. Screened Covid + but asymptomatic; tPA and underwent thrombectomy; during procedure had ICA vasospasm vs dissection.  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 Pt self extubated 05/20/20.    OT comments  Pt demonstrating increased arousal and engagement this session. Pt performing drawing task and looking through picture book (of family). Pt answering simple questions either with head nods, point, or writing on white board. Requiring increased time throughout for processing. Pt performing oral care with Supervision and Min cues. Requiring Mod-Max A +2 for functional transfers and continues to present with RLE weakness and buckling at knee. Stretch RLE in extension prior to transfer. VSS on RA. Continue to highly recommend dc to CIR and will continue to follow acutely as admitted.    Follow Up Recommendations  CIR;Supervision/Assistance - 24 hour    Equipment Recommendations  3 in 1 bedside commode;Other (comment)    Recommendations for Other Services Rehab consult    Precautions / Restrictions Precautions Precautions: Fall Precaution Comments: right sided weakness (R leg most significantly)       Mobility Bed Mobility Overal bed mobility: Needs Assistance Bed Mobility: Sit to Supine       Sit to supine: Min assist   General bed mobility comments: Min A for managing RLE  Transfers Overall transfer level: Needs assistance Equipment used: Rolling walker (2 wheeled) Transfers: Sit to/from [employer] Sit to Stand: Mod assist;+2 physical assistance Stand pivot transfers: Max assist;+2 safety/equipment;Mod  assist       General transfer comment: Mod A +2 to power up into standing with R knee blocked. Cues for hand placement when using RW. Requiring Max A for balance duirng pivot to EOB    Balance Overall balance assessment: Needs assistance Sitting-balance support: Single extremity supported;No upper extremity supported Sitting balance-Leahy Scale: Fair     Standing balance support: Bilateral upper extremity supported;During functional activity Standing balance-Leahy Scale: Poor                             ADL either performed or assessed with clinical judgement   ADL Overall ADL's : Needs assistance/impaired     Grooming: Oral care;Moderate assistance;Sitting Grooming Details (indicate cue type and reason): Pt performing oral care while seated in recliner. Pt demonsatrating increased probnlem solving and sequencing. Pt requiring increased time between steps and pausing to make sure she is perform task correctly                 Toilet Transfer: Maximal assistance;+2 for physical assistance;+2 for safety/equipment;Stand-pivot;Moderate assistance;RW (simualted to recliner) Toilet Transfer Details (indicate cue type and reason): Mod A +2 to power up into standing with Max cues for hand placement when using RW. Pt requiring Max A +2 for pivot. Requiring Max A to block R knee         Functional mobility during ADLs: Maximal assistance;+2 for physical assistance;+2 for safety/equipment;Moderate assistance;Rolling walker General ADL Comments: Pt participating in drawing task and looking through family picture book. Pointing at her mother, brother, and sister. Writing her brother's name "Gail Montgomery" when asked "what is your brother's name" Requiring three attemps. writing her sister's name after similar prompt  on first attempt. Engaging in oral care and performing wiht increased time and Min cues; decreased perseveration on crushing. Continues to present with RLE weakness and  requiring R knee blocked during transfer. Use of RW for UE support     Vision       Perception     Praxis      Cognition Arousal/Alertness: Awake/alert Behavior During Therapy: WFL for tasks assessed/performed Overall Cognitive Status: Difficult to assess Area of Impairment: Following commands;Attention;Safety/judgement;Awareness;Problem solving                   Current Attention Level: Sustained   Following Commands: Follows one step commands with increased time Safety/Judgement: Decreased awareness of safety;Decreased awareness of deficits Awareness: Intellectual Problem Solving: Slow processing;Requires tactile cues General Comments: Pt engaging in picture drawing task. Pt drawing a flower after due "do you want to draw". Pt drawing second flower after cue. Pt writing her name with once cue. Pt also with increased problem solving while engaging in grooming task requiring Min cues; less than last session. Pt also nodding head for yes and no. Stating yes once.        Exercises Exercises: Other exercises Other Exercises Other Exercises: prolonged stretch into knee extension with heel coord stretch for over 30 sec.  Pt whimpering in pain.   Shoulder Instructions       General Comments VSS on RA    Pertinent Vitals/ Pain       Pain Assessment: Faces Faces Pain Scale: Hurts even more Pain Location: grimace with R LE knee ext/add/IR  pt points to area over IT band Pain Descriptors / Indicators: Grimacing (tearful) Pain Intervention(s): Monitored during session;Limited activity within patient's tolerance;Repositioned  Home Living                                          Prior Functioning/Environment              Frequency  Min 2X/week        Progress Toward Goals  OT Goals(current goals can now be found in the care plan section)  Progress towards OT goals: Progressing toward goals  Acute Rehab OT Goals Patient Stated Goal: mother  would like her to return home with mom at d/c OT Goal Formulation: Patient unable to participate in goal setting Time For Goal Achievement: 06/04/20 Potential to Achieve Goals: Good ADL Goals Pt Will Perform Grooming: with min assist;sitting Pt Will Perform Upper Body Bathing: with min assist;sitting Pt Will Perform Lower Body Bathing: with mod assist;sit to/from stand Pt Will Transfer to Toilet: with mod assist;bedside commode;squat pivot transfer Additional ADL Goal #1: Pt will demosntrate sustained attnetion to task in nondistrating enviornment with minimal redurectional cues  Plan Discharge plan remains appropriate    Co-evaluation                 AM-PAC OT "6 Clicks" Daily Activity     Outcome Measure   Help from another person eating meals?: A Lot Help from another person taking care of personal grooming?: A Lot Help from another person toileting, which includes using toliet, bedpan, or urinal?: A Lot Help from another person bathing (including washing, rinsing, drying)?: Total Help from another person to put on and taking off regular upper body clothing?: Total Help from another person to put on and taking off regular lower body clothing?: Total 6 Click  Score: 9    End of Session Equipment Utilized During Treatment: Rolling walker  OT Visit Diagnosis: Unsteadiness on feet (R26.81);Other abnormalities of gait and mobility (R26.89);Muscle weakness (generalized) (M62.81);Apraxia (R48.2);Other symptoms and signs involving cognitive function;Cognitive communication deficit (R41.841);Other symptoms and signs involving the nervous system (R29.898);Hemiplegia and hemiparesis Symptoms and signs involving cognitive functions: Cerebral infarction;Nontraumatic SAH Hemiplegia - Right/Left: Right Hemiplegia - dominant/non-dominant: Dominant Hemiplegia - caused by: Cerebral infarction;Nontraumatic SAH   Activity Tolerance Patient tolerated treatment well   Patient Left with call  bell/phone within reach;in chair;with nursing/sitter in room;with chair alarm set   Nurse Communication Mobility status        Time: 1441-1515 OT Time Calculation (min): 34 min  Charges: OT General Charges $OT Visit: 1 Visit OT Treatments $Self Care/Home Management : 8-22 mins $Therapeutic Activity: 8-22 mins  Hanin Decook MSOT, OTR/L Acute Rehab Pager: [phone] Office: [phone]   Theodoro Grist Ellionna Buckbee 06/01/2020, 5:31 PM

## 2020-06-01 NOTE — Progress Notes (Signed)
PROGRESS NOTE                                                                                                                                                                                                             Patient Demographics:    Gail Montgomery, is a 24 y.o. female, DOB - Mar 26, 1996, ZOX:[id number]  Outpatient Primary MD for the patient is No primary care provider on file.    LOS - 13  Admit date - 05/19/2020         Brief Narrative - 24 year old female normal upon waking up this morning 8/21 2021. At 09:30 she had sudden onset of right hemiparesis, and aphasia. She presents 8/21/2021to the [facility] ED as a Code Stroke  with acute left MCA syndrome.She was screened as Covid +, but was asymptomatic. She received tPA at 11:29 am , she was deemed a  candidate for thrombectomy, and was taken to IR, in IR she had successful  opening of her LMCA with reperfusion, but LACA has a residual small distal anterior cerebral clot. During procedure there was  ICA vasospasm vs dissection. She was loaded with ASA and Plavix. There was concern that she would need carotid  stenting . However upon re-imaging the vessel was open, and she did not require stent. SAH, Parenchymal hemorrhage, and intraventricular hemorrhage.   She has had persistent, intermittent fevers since 8/24. Also has had sinus tachycardia, from 110s to 130s, occasionally 140s 150s.  Tachy seems to correlate with the severity of fever mostly, she was breifly given Vanc and Zosyn in ICU on 8/25- 8/27.  Abx were stopped, she was then found to have DVT-PE and started on Hep gtt, transferred to my service on 05/31/20 on 12 of her Hospital stay. 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    Subjective:   Patient in bed appears to be in no distress, still non verbal.   Assessment  & Plan :     1. Incidental Covid +ve status with no Pulmonary  symptoms - fully vaccinated with Pfizer, monitor.repeat Covid test on 05/31/2020.  Trying to remove isolation as soon as possible.   Recent Labs  Lab 05/26/20 1300 05/28/20 0151 05/28/20 8119 05/28/20 1478 05/28/20 2956 05/28/20 1225 05/29/20 2130 05/30/20 8657 05/31/20 8469 05/31/20 6295 06/01/20 2841  WBC  --  15.7*  --   --   --   --  15.0* 13.7* 10.5  --  11.8*  PLT  --  190  --   --   --   --  242 187 412*  --  512*  CRP  --   --   --   --  28.8*  --   --   --   --  17.3* 17.7*  BNP  --   --   --   --   --   --   --   --   --  33.8 21.4  DDIMER  --   --   --   --  12.89*  --   --   --   --  6.37* 5.40*  PROCALCITON   < >  --   --   --   --  0.25 0.32 0.41  --  0.19 0.18  AST  --   --   --  71*  --   --  66* 82*  --   --  83*  ALT  --   --   --  56*  --   --  57* 72*  --   --  90*  ALKPHOS  --   --   --  44  --   --  43 53  --   --  46  BILITOT  --   --   --  0.3  --   --  0.2* 0.7  --   --  0.4  ALBUMIN  --   --   --  2.4*  --   --  2.0* 2.3*  --   --  2.2*  INR  --   --   --   --   --   --  1.1 1.1 1.1  --  1.2  LATICACIDVEN  --   --  0.8  --   --   --   --   --   --   --   --    < > = values in this interval not displayed.    2. 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 She has had full stroke work-up and neurology is following, transcranial Doppler with bubble study suggestive of possible PFO.  She also had work-up consistent with PE and DVT, she likely has paradoxical embolic infarction in the setting of DVT PE with PFO. Repeat CT on 06/01/20 shows improvement in ICH. Currently on nimodipine with goal SBP of under 160, LDL greater than 78 but statin held due to subarachnoid hemorrhage by neurology, statin per Neuro.   3.  PFO.  Once she is out of her Covid quarantine will  consult cardiology for TEE and possible PFO closure.  4.  Right lower extremity acute DVT and bilateral acute PE.  Due to combination of hormonal contraception, obesity, some exposure to cigarette smoking, long flight to [facility] prior to her sickness.  Anticoagulation with heparin thereafter at least 9 months of anticoagulation with outpatient hematology follow-up for hypercoagulable state work-up if needed.  5.  Persistent low-grade fevers.  Could be due to blood clots and also now evidence of E. coli UTI on 05/31/2020.  3 days of  Rocephin, anticoagulation and monitor.  If all negative then possibly central fevers.   6.  Intermittent smoking will counsel prior to discharge.  7.  Obesity BMI of 32 upon admission.  Follow-up with PCP.  8.  Sick euthyroid syndrome.  Repeat TSH in 6  weeks.   9.  Hyponatremia.  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  Stop further IV fluids, gentle Lasix on 05/31/2020 improved, repeat a low dose on 06/01/20.      Condition - Extremely Guarded  Family Communication  :  Mother Darlina Sicilian [phone] - 05/31/20 in detail, 06/01/20  Code Status :  Full  Consults  :  PCCM, neurology  Procedures  :    EEG  CTA - 1. Bilateral segmental LOWER lobe pulmonary emboli, LEFT-greater-than-RIGHT. Equivocal segmental emboli within the LEFT UPPER lobe. 2. Mild cardiomegaly.   CT HEAD CODE STROKE WO CONTRAST 05/19/2020 Dense left ICA terminus and MCA. No acute hemorrhage. ASPECTS is 10.   CT Code Stroke CTA Head W/WO contrast CT Code Stroke CTA Neck W/WO contrast 05/19/2020 1. 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  2. Mildly indistinct appearance of the proximal left ICA is likely from motion artifact, consider cervical run. No atheromatous changes or vasculopathy seen in the neck.   Neuro Interventional Radiology - Cerebral Angiogram with Intervention - Dr Loreta Ave 05/19/20 2:27 PM Left MCA: Baseline TICI 0 First Pass  Device:Local aspiration and solitaire 4 x 40  ResultTICI 0 SecondPass Device:Local aspiration and solitaire 4 x 40  ResultTICI 3 Left ACA/pericallosal  Baseline TICI 0,    Final TICI 0 pericallosal artery Findings:Patent right CFA Left M1 occlusion, final is TICI 3  Left ACA A2 occlusion migrated to pericallosal artery during the case. Could not complete a first pass safely.  Significant irregularity of the cervical ICA, concerning for dissection, that resolved after observation, and once DAPT was initiated. The final appearance may represent spasm vs FMD/vasculitis  CT HEAD WO CONTRAST 05/19/2020 New large volume of subarachnoid and intraventricular hemorrhage.  CT HEAD WO CONTRAST 05/20/2020 1. Subarachnoid hemorrhage along the anterior interhemispheric fissure with slightly increased extension into both lateral ventricles.  2. Slight increase in size of the temporal horn of the left lateral ventricle consistent with early communicating hydrocephalus.   MR BRAIN WO CONTRAST 05/20/2020 1. Early subacute infarct of the left basal ganglia involving the posterior putamen and caudate body.  2. Subarachnoid hemorrhage along the interhemispheric fissure extending into the lateral ventricles.  3. 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   CT HEAD WO CONTRAST 05/21/2020 1. Stable appearance of left basal ganglia nonhemorrhagic infarction. 2. Stable medial left frontal extra-axial hemorrhage, extending into the ventricles bilaterally. 3. Subarachnoid hemorrhage posteriorly on the left and along the inter cerebral hemisphere likely represents redistribution without significant new hemorrhage. 4. Hemorrhage is present within the aqueduct of Sylvius. 5. Progressive dilation of the ventricles compatible with developing hydrocephalus. 6. No  new areas of hemorrhage.   CT HEAD WO CONTRAST 05/22/2020 1. Regressed intraventricular hemorrhage since yesterday with small volume residual. Stable mild ventriculomegaly, and possible mild transependymal edema. 2. Stable left inferior frontal gyrus parasagittal hematoma (approximately 12 mL) and regional edema. No significant midline shift, basilar cisterns are patent. 3. Stable left basal ganglia infarcts. 4. No new intracranial abnormality.   CT HEAD WO CONTRAST 05/24/20 IMPRESSION: 1. 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 2. Unchanged intracranial hemorrhage with mild lateral  ventriculomegaly.  ECHOCARDIOGRAM COMPLETE 05/20/2020 1. Left ventricular ejection fraction, by estimation, is 60 to 65%. The left ventricle has normal function. The left ventricle has no regional wall motion abnormalities. Left ventricular diastolic parameters were normal.   2. Right ventricular systolic function is normal. The right ventricular size is normal.   3. The mitral valve is normal in structure. Trivial mitral valve regurgitation. No evidence of mitral stenosis.   4. The aortic valve is normal in structure. Aortic valve regurgitation is not visualized. No aortic stenosis is present.   5. The inferior vena cava is normal in size with greater than 50% respiratory variability, suggesting right atrial pressure of 3 mmHg.   Bilateral Lower Extremity Venous Dopplers  05/21/2020 RIGHT: - There is no evidence of deep vein thrombosis in the lower extremity. However, portions of this examination were limited- see technologist comments above.  - No cystic structure found in the popliteal fossa.   LEFT: - There is no evidence of deep vein thrombosis in the lower extremity. However, portions of this examination were limited- see technologist comments above.  - No cystic structure found in the popliteal fossa.    Transcranial Doppler w/ Bubble 05/25/2020 Not cooperative for Valsalva.  Bubble study showed  Spencer degree 3 at rest.  Recommend TEE for further confirmation.    PUD Prophylaxis : PPI  Disposition Plan  :    Status is: Inpatient  Remains inpatient appropriate because:IV treatments appropriate due to intensity of illness or inability to take PO   Dispo: The patient is from: Home              Anticipated d/c is to: CIR              Anticipated d/c date is: > 3 days              Patient currently is not medically stable to d/c.   DVT Prophylaxis  :    Heparin    Lab Results  Component Value Date   PLT 512 (H) 06/01/2020    Diet :  Diet Order            DIET DYS 2 Room service appropriate? Yes with Assist; Fluid consistency: Thin  Diet effective now                  Inpatient Medications  Scheduled Meds:  apixaban  5 mg Oral BID   Chlorhexidine Gluconate Cloth  6 each Topical Daily   folic acid  1 mg Oral Daily   mouth rinse  15 mL Mouth Rinse BID   multivitamin  1 tablet Oral Daily   niMODipine  60 mg Oral Q4H   pantoprazole  40 mg Oral Daily   thiamine  100 mg Oral Daily   vitamin B-12  500 mcg Oral Daily   Continuous Infusions:  cefTRIAXone (ROCEPHIN)  IV 1 g (06/01/20 1214)   PRN Meds:.acetaminophen **OR** [DISCONTINUED] acetaminophen (TYLENOL) oral liquid 160 mg/5 mL **OR** [DISCONTINUED] acetaminophen, docusate, polyethylene glycol, sodium chloride flush  Antibiotics  :    Anti-infectives (From admission, onward)   Start     Dose/Rate Route Frequency Ordered Stop   05/31/20 1215  cefTRIAXone (ROCEPHIN) 1 g in sodium chloride 0.9 % 100 mL IVPB        1 g 200 mL/hr over 30 Minutes Intravenous Every 24 hours 05/31/20 1205 06/03/20 1214   05/29/20 0000  vancomycin (VANCOCIN) IVPB 1000 mg/200 mL premix  Status:  Discontinued  1,000 mg 200 mL/hr over 60 Minutes Intravenous Every 8 hours 05/28/20 1543 05/28/20 1557   05/28/20 1545  ceFEPIme (MAXIPIME) 2 g in sodium chloride 0.9 % 100 mL IVPB  Status:  Discontinued        2 g 200  mL/hr over 30 Minutes Intravenous Every 8 hours 05/28/20 1541 05/28/20 1557   05/28/20 1545  vancomycin (VANCOREADY) IVPB 1500 mg/300 mL  Status:  Discontinued        1,500 mg 150 mL/hr over 120 Minutes Intravenous  Once 05/28/20 1541 05/28/20 1606   05/23/20 2200  piperacillin-tazobactam (ZOSYN) IVPB 3.375 g  Status:  Discontinued        3.375 g 12.5 mL/hr over 240 Minutes Intravenous Every 8 hours 05/23/20 2055 05/25/20 1619   05/23/20 2100  vancomycin (VANCOCIN) IVPB 1000 mg/200 mL premix  Status:  Discontinued        1,000 mg 200 mL/hr over 60 Minutes Intravenous Every 8 hours 05/23/20 2055 05/25/20 1608       Time Spent in minutes  30   Susa Raring M.D on 06/01/2020 at 1:24 PM  To page go to [email or web address] - password [facility]  [facility] -  Office  [phone]    See all Orders from today for further details    Objective:   Vitals:   06/01/20 0700 06/01/20 0800 06/01/20 0900 06/01/20 1000  BP: (!) 122/92 (!) 152/85  112/77  Pulse: 86  (!) 102 99  Resp: 16 14 19 18   Temp:      TempSrc:      SpO2: 99% 98% 94% 96%  Weight:      Height:        Wt Readings from Last 3 Encounters:  05/29/20 85.1 kg  11/07/16 70.3 kg (86 %, Z= 1.08)*   * Growth percentiles are based on CDC (Girls, 2-20 Years) data.     Intake/Output Summary (Last 24 hours) at 06/01/2020 1324 Last data filed at 06/01/2020 0850 Gross per 24 hour  Intake 650.24 ml  Output 650 ml  Net 0.24 ml     Physical Exam  Awake Alert, Non verbal, R leg >> weaker than R Arm,  [place].AT,PERRAL Supple Neck,No JVD, No cervical lymphadenopathy appriciated.  Symmetrical Chest wall movement, Good air movement bilaterally, CTAB RRR,No Gallops, Rubs or new Murmurs, No Parasternal Heave +ve B.Sounds, Abd Soft, No tenderness, No organomegaly appriciated, No rebound - guarding or rigidity. No Cyanosis, Clubbing or edema, No new Rash or bruise     Data Review:    CBC Recent Labs  Lab 05/28/20 0151  05/29/20 0055 05/30/20 0317 05/31/20 0450 06/01/20 0325  WBC 15.7* 15.0* 13.7* 10.5 11.8*  HGB 9.6* 9.4* 10.4* 9.8* 9.8*  HCT 28.7* 28.4* 32.7* 30.5* 29.5*  PLT 190 242 187 412* 512*  MCV 87.2 86.6 89.1 87.6 87.8  MCH 29.2 28.7 28.3 28.2 29.2  MCHC 33.4 33.1 31.8 32.1 33.2  RDW 12.0 11.9 12.2 12.1 12.1    Recent Labs  Lab 05/26/20 1300 05/28/20 0151 05/28/20 0151 05/28/20 0438 05/28/20 0859 05/28/20 0947 05/28/20 1225 05/29/20 0055 05/30/20 0317 05/31/20 0450 05/31/20 0828 06/01/20 0325  NA  --  129*   < >  --  131*  --   --  130* 130*  --  129* 131*  K  --  4.1   < >  --  3.9  --   --  3.8 4.1  --  3.7 4.3  CL  --  99   < >  --  99  --   --  98 98  --  99 97*  CO2  --  18*   < >  --  19*  --   --  21* 16*  --  19* 21*  GLUCOSE  --  115*   < >  --  115*  --   --  104* 94  --  102* 97  BUN  --  9   < >  --  8  --   --  9 13  --  9 7  CREATININE  --  0.56   < >  --  0.60  --   --  0.57 0.57  --  0.51 0.58  CALCIUM  --  8.2*   < >  --  8.5*  --   --  8.4* 8.5*  --  8.4* 8.8*  AST  --   --   --   --  71*  --   --  66* 82*  --   --  83*  ALT  --   --   --   --  56*  --   --  57* 72*  --   --  90*  ALKPHOS  --   --   --   --  44  --   --  43 53  --   --  46  BILITOT  --   --   --   --  0.3  --   --  0.2* 0.7  --   --  0.4  ALBUMIN  --   --   --   --  2.4*  --   --  2.0* 2.3*  --   --  2.2*  MG  --  2.0  --   --   --   --   --  2.1  --   --  2.0 2.0  CRP  --   --   --   --   --  28.8*  --   --   --   --  17.3* 17.7*  DDIMER  --   --   --   --   --  12.89*  --   --   --   --  6.37* 5.40*  PROCALCITON   < >  --   --   --   --   --  0.25 0.32 0.41  --  0.19 0.18  LATICACIDVEN  --   --   --  0.8  --   --   --   --   --   --   --   --   INR  --   --   --   --   --   --   --  1.1 1.1 1.1  --  1.2  BNP  --   --   --   --   --   --   --   --   --   --  33.8 21.4   < > = values in this interval not displayed.     ------------------------------------------------------------------------------------------------------------------ No results for input(s): CHOL, HDL, LDLCALC, TRIG, CHOLHDL, LDLDIRECT in the last 72 hours.  Lab Results  Component Value Date   HGBA1C 5.6 05/20/2020   ------------------------------------------------------------------------------------------------------------------ No results for input(s): TSH, T4TOTAL, T3FREE, THYROIDAB in the last 72 hours.  Invalid input(s): FREET3  Cardiac Enzymes No results for input(s): CKMB, TROPONINI, MYOGLOBIN in the last 168 hours.  Invalid input(s): CK ------------------------------------------------------------------------------------------------------------------    Component Value Date/Time   BNP 21.4 06/01/2020 0325    Micro Results Recent Results (from the past 240 hour(s))  Culture, blood (routine x 2)     Status: None   Collection Time: 05/23/20  3:44 PM   Specimen: BLOOD  Result Value Ref Range Status   Specimen Description BLOOD SITE NOT SPECIFIED  Final   Special Requests   Final    BOTTLES DRAWN AEROBIC ONLY Blood Culture adequate volume   Culture   Final    NO GROWTH 5 DAYS Performed at [facility], [address], [place], [place] [address]    Report Status 05/28/2020 FINAL  Final  Culture, blood (routine x 2)     Status: None   Collection Time: 05/23/20  7:57 PM   Specimen: BLOOD  Result Value Ref Range Status   Specimen Description BLOOD RIGHT ANTECUBITAL  Final   Special Requests   Final    BOTTLES DRAWN AEROBIC ONLY Blood Culture adequate volume   Culture   Final    NO GROWTH 5 DAYS Performed at [facility], [address], [place], [place] [address]    Report Status 05/28/2020 FINAL  Final  Culture, Urine     Status: None   Collection Time: 05/23/20  9:03 PM   Specimen: Urine, Random  Result Value Ref Range Status   Specimen Description URINE, RANDOM  Final   Special Requests NONE   Final   Culture   Final    NO GROWTH Performed at [facility], [address] [place], [place] [address]    Report Status 05/24/2020 FINAL  Final  Culture, blood (routine x 2)     Status: None (Preliminary result)   Collection Time: 05/28/20  4:39 AM   Specimen: BLOOD RIGHT HAND  Result Value Ref Range Status   Specimen Description BLOOD RIGHT HAND  Final   Special Requests   Final    BOTTLES DRAWN AEROBIC ONLY Blood Culture results may not be optimal due to an inadequate volume of blood received in culture bottles   Culture   Final    NO GROWTH 4 DAYS Performed at [facility], [address] [place], [place] [address]    Report Status PENDING  Incomplete  Culture, blood (routine x 2)     Status: None (Preliminary result)   Collection Time: 05/28/20  4:39 AM   Specimen: BLOOD  Result Value Ref Range Status   Specimen Description BLOOD RIGHT ARM  Final   Special Requests   Final    BOTTLES DRAWN AEROBIC ONLY Blood Culture results may not be optimal due to an inadequate volume of blood received in culture bottles   Culture   Final    NO GROWTH 4 DAYS Performed at [facility], [address] [place], [place] [address]    Report Status PENDING  Incomplete  Culture, Urine     Status: Abnormal   Collection Time: 05/28/20  1:51 PM   Specimen: Urine, Random  Result Value Ref Range Status   Specimen Description URINE, RANDOM  Final   Special Requests   Final    NONE Performed at [facility], [address] [place], [place] [address]    Culture >=100,000 COLONIES/mL ESCHERICHIA COLI (A)  Final   Report Status 05/30/2020 FINAL  Final   Organism ID, Bacteria ESCHERICHIA COLI (A)  Final  Susceptibility   Escherichia coli - MIC*    AMPICILLIN >=32 RESISTANT Resistant     CEFAZOLIN 16 SENSITIVE Sensitive     CEFTRIAXONE <=0.25 SENSITIVE Sensitive     CIPROFLOXACIN <=0.25 SENSITIVE Sensitive     GENTAMICIN >=16 RESISTANT Resistant     IMIPENEM  <=0.25 SENSITIVE Sensitive     NITROFURANTOIN <=16 SENSITIVE Sensitive     TRIMETH/SULFA <=20 SENSITIVE Sensitive     AMPICILLIN/SULBACTAM >=32 RESISTANT Resistant     PIP/TAZO <=4 SENSITIVE Sensitive     * >=100,000 COLONIES/mL ESCHERICHIA COLI    Radiology Reports CT Code Stroke CTA Head W/WO contrast  Result Date: 05/19/2020 CLINICAL DATA:  Stroke suspected. EXAM: CT ANGIOGRAPHY HEAD AND NECK TECHNIQUE: Multidetector CT imaging of the head and neck was performed using the standard protocol during bolus administration of intravenous contrast. Multiplanar CT image reconstructions and MIPs were obtained to evaluate the vascular anatomy. Carotid stenosis measurements (when applicable) are obtained utilizing NASCET criteria, using the distal internal carotid diameter as the denominator. CONTRAST:  Dose is currently not known COMPARISON:  Head CT reported separately FINDINGS: CTA NECK FINDINGS Aortic arch: Negative.  Three vessel branching Right carotid system: No atheromatous changes, dissection, or beading. Left carotid system: Mild motion artifact which could account for the mildly smudged appearance of the left ICA proximally. Vertebral arteries: No proximal subclavian stenosis. Codominant vertebral arteries that are smooth and widely patent to the dura. Skeleton: No acute or aggressive finding Other neck: Negative Upper chest: Negative Review of the MIP images confirms the above findings CTA HEAD FINDINGS Anterior circulation: Cut off at the left ICA terminus with visible luminal clot. There is continuation of non opacification throughout the left M1 segment which is clot by CT. Non opacification at the left A1 origin. No contralateral branch occlusion is seen. Intravascular enhancement seen in the left operculum branches on the delayed phase. Posterior circulation: The vertebral and basilar arteries are smooth and widely patent. No branch occlusion, beading, or aneurysm. Venous sinuses: Patent Anatomic  variants: None significant Review of the MIP images confirms the above findings These results were called by telephone at the time of interpretation on 05/19/2020 at 11:35 am to provider [facility] , who is already aware. IMPRESSION: 1. 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 2. Mildly indistinct appearance of the proximal left ICA is likely from motion artifact, consider cervical run. No atheromatous changes or vasculopathy seen in the neck. Electronically Signed   By: Marnee Spring M.D.   On: 05/19/2020 11:40   CT Head Wo Contrast  Result Date: 06/01/2020 CLINICAL DATA:  Left ICA and MCA occlusion 05/19/2020 with subsequent intervention and subarachnoid hemorrhage. EXAM: CT HEAD WITHOUT CONTRAST TECHNIQUE: Contiguous axial images were obtained from the base of the skull through the vertex without intravenous contrast. COMPARISON:  CT 05/29/2020. Multiple previous imaging studies as distant as 05/19/2020. FINDINGS: Brain: Brainstem and cerebellum remain normal. Focal hematoma in the anterior interhemispheric fissure continues to become less dense. Infarction in the left cingulate gyrus and left posterior putamen and caudate nucleus as seen previously. Question some infarction also of the left temporal lobe. No hydrocephalus. No subdural collection. Vascular: No new vascular finding. Skull: Negative Sinuses/Orbits: Clear except for near total opacification of the left sphenoid sinus. Orbits negative. Other: None IMPRESSION: 1. Focal hematoma in the anterior interhemispheric fissure continues to become less dense. 2. Infarction in the left cingulate gyrus and left posterior putamen and caudate nucleus as  seen previously. Question some infarction also of the left temporal lobe. Electronically Signed   By: Paulina Fusi M.D.   On: 06/01/2020 11:14   CT Head Wo Contrast  Result Date: 05/29/2020 CLINICAL DATA:  Intracranial hemorrhage follow-up. EXAM: CT HEAD  WITHOUT CONTRAST TECHNIQUE: Contiguous axial images were obtained from the base of the skull through the vertex without intravenous contrast. COMPARISON:  CT head 05/24/2020 FINDINGS: Brain: Interval decrease in the size of hyperdense anterior interhemispheric hemorrhage, which is both intraparenchymal and extra-axial. Interval decrease in small volume intraventricular hemorrhage that is layering in the occipital horns bilaterally. No new areas of hemorrhage. Mildly decreased edema associated with a left parafalcine frontal ACA branch territory infarct. Similar edema associated with a left lateral lenticulostriate MCA territory infarct. No new areas of acute large vascular territory infarct. Slight decrease in the size of the temporal horns lateral ventricles, which are mildly rounded. No new mass effect. No substantial midline shift. MIld suprasellar cistern effacement. Vascular: No interval findings. Skull: Negative. Sinuses/Orbits: Left sphenoid opacification with frothy secretions. IMPRESSION: 1. Interval decrease in the size/conspicuity of intraparenchymal, extra-axial, and intraventricular hemorrhage, as above. No new acute hemorrhage. 2. Redemonstrated subacute left ACA and left MCA territory infarcts. No progressive mass effect. 3. Slightly improved mild ventriculomegaly. Electronically Signed   By: Feliberto Harts MD   On: 05/29/2020 08:26   CT HEAD WO CONTRAST  Result Date: 05/24/2020 CLINICAL DATA:  Stroke follow-up.  COVID positive EXAM: CT HEAD WITHOUT CONTRAST TECHNIQUE: Contiguous axial images were obtained from the base of the skull through the vertex without intravenous contrast. COMPARISON:  Yesterday FINDINGS: Brain: Anterior interhemispheric hemorrhage (subarachnoid and also parenchymal) with adjacent left ACA branch infarct that is pericallosal. There is also a lateral lenticulostriate infarct on the left. Intraventricular hemorrhage with lateral ventriculomegaly that measures similar to  yesterday. No evidence of new hemorrhage or new infarct. Vascular: No interval finding Skull: Negative Sinuses/Orbits: Left sphenoid sinus opacification. IMPRESSION: 1. 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 2. Unchanged intracranial hemorrhage with mild lateral ventriculomegaly. Electronically Signed   By: Marnee Spring M.D.   On: 05/24/2020 07:13   CT HEAD WO CONTRAST  Result Date: 05/22/2020 CLINICAL DATA:  24 year old female status post code stroke presentation on 05/19/2020 with left ICA terminus ELVO. Status post endovascular reperfusion, subarachnoid and intra-axial hemorrhage. EXAM: CT HEAD WITHOUT CONTRAST TECHNIQUE: Contiguous axial images were obtained from the base of the skull through the vertex without intravenous contrast. COMPARISON:  Brain MRI 05/20/2020 and earlier. Head CT 05/21/2020 and earlier. FINDINGS: Brain: Decreased intraventricular hemorrhage since yesterday with small volume residual. There is mild ventriculomegaly, possible mild transependymal edema. Left inferior frontal gyrus parasagittal blood which appears to be both intra-and extra-axial encompasses 48 by 19 x 26 mm (AP by transverse by CC), and is stable since yesterday (estimated total volume 12 mL). Associated edema in the medial left inferior frontal gyrus, gyrus rectus and cingulate is stable. Stable mild regional mass effect. No midline shift. Basilar cisterns remain patent. Trace subarachnoid hemorrhage elsewhere. Cytotoxic edema redemonstrated in the left basal ganglia and stable. Stable gray-white matter differentiation throughout the brain. Vascular: No suspicious intracranial vascular hyperdensity now. Skull: No acute osseous abnormality identified. Sinuses/Orbits: Stable fluid levels in the sphenoid sinuses. Other paranasal sinuses and mastoids are stable and well pneumatized. Other: No acute orbit or scalp soft tissue finding. IMPRESSION: 1. Regressed intraventricular hemorrhage since yesterday with  small volume residual. Stable mild ventriculomegaly, and possible mild transependymal edema. 2.  Stable left inferior frontal gyrus parasagittal hematoma (approximately 12 mL) and regional edema. No significant midline shift, basilar cisterns are patent. 3. Stable left basal ganglia infarcts. 4. No new intracranial abnormality. Electronically Signed   By: Odessa Fleming M.D.   On: 05/22/2020 08:40   CT HEAD WO CONTRAST  Result Date: 05/21/2020 CLINICAL DATA:  Stroke, follow-up. EXAM: CT HEAD WITHOUT CONTRAST TECHNIQUE: Contiguous axial images were obtained from the base of the skull through the vertex without intravenous contrast. COMPARISON:  CT head without contrast 05/19/2020 and 05/20/2020. MR head without contrast 05/20/2020. FINDINGS: Brain: Left basal ganglia nonhemorrhagic infarction is again noted. Predominantly extra-axial medial left frontal hemorrhage is again noted, extending into the ventricles bilaterally. No significant change in hemorrhage volume is present. Ventricles are further dilated. No new areas of hemorrhage are present. Additional subarachnoid hemorrhage is now seen posteriorly on the left and along the inter cerebral hemisphere. Hemorrhage is present within the aqueduct of Sylvius. Vascular: No hyperdense vessel or unexpected calcification. Skull: Calvarium is intact. No focal lytic or blastic lesions are present. No significant extracranial soft tissue lesion is present. Sinuses/Orbits: Fluid is present in the sphenoid sinuses bilaterally and in the nasopharynx. The paranasal sinuses and mastoid air cells are otherwise clear. The globes and orbits are within normal limits. IMPRESSION: 1. Stable appearance of left basal ganglia nonhemorrhagic infarction. 2. Stable medial left frontal extra-axial hemorrhage, extending into the ventricles bilaterally. 3. Subarachnoid hemorrhage posteriorly on the left and along the inter cerebral hemisphere likely represents redistribution without significant  new hemorrhage. 4. Hemorrhage is present within the aqueduct of Sylvius. 5. Progressive dilation of the ventricles compatible with developing hydrocephalus. 6. No new areas of hemorrhage. Electronically Signed   By: Marin Roberts M.D.   On: 05/21/2020 06:39   CT HEAD WO CONTRAST  Result Date: 05/20/2020 CLINICAL DATA:  Intracranial hemorrhage follow up EXAM: CT HEAD WITHOUT CONTRAST TECHNIQUE: Contiguous axial images were obtained from the base of the skull through the vertex without intravenous contrast. COMPARISON:  05/19/2020 FINDINGS: Brain: There is subarachnoid hemorrhage again along the anterior interhemispheric fissure with extension into both lateral ventricles. The temporal horn of the left lateral ventricle has slightly increased in size. The amount of blood in the ventricles is also slightly greater. There is no herniation. Vascular: No hyperdense vessel or unexpected calcification. Skull: Normal. Negative for fracture or focal lesion. Sinuses/Orbits: No acute finding. Other: None. IMPRESSION: 1. Subarachnoid hemorrhage along the anterior interhemispheric fissure with slightly increased extension into both lateral ventricles. 2. Slight increase in size of the temporal horn of the left lateral ventricle consistent with early communicating hydrocephalus. Electronically Signed   By: Deatra Robinson M.D.   On: 05/20/2020 01:31   CT HEAD WO CONTRAST  Result Date: 05/19/2020 CLINICAL DATA:  Patient status post intervention for left internal carotid and MCA occlusion. EXAM: CT HEAD WITHOUT CONTRAST TECHNIQUE: Contiguous axial images were obtained from the base of the skull through the vertex without intravenous contrast. COMPARISON:  Head CT earlier today. FINDINGS: Brain: The patient has a new large volume of subarachnoid and intraventricular hemorrhage centered anteriorly at the circle-of-Willis. Hemorrhage extends into the fourth ventricle. There is no midline shift or hydrocephalus. Gray-white  differentiation appears preserved. No mass is identified. Imaged intracranial contents appear normal. Vascular: Large vessel structures are largely opacified with contrast. Skull: Intact. Sinuses/Orbits: Negative. Other: None. IMPRESSION: New large volume of subarachnoid and intraventricular hemorrhage. Critical Value/emergent results were called by telephone at the time of interpretation  on 05/19/2020 at 3:38 pm to provider Dr. Amada Jupiter, who verbally acknowledged these results. Electronically Signed   By: Drusilla Kanner M.D.   On: 05/19/2020 15:42   CT Code Stroke CTA Neck W/WO contrast  Result Date: 05/19/2020 CLINICAL DATA:  Stroke suspected. EXAM: CT ANGIOGRAPHY HEAD AND NECK TECHNIQUE: Multidetector CT imaging of the head and neck was performed using the standard protocol during bolus administration of intravenous contrast. Multiplanar CT image reconstructions and MIPs were obtained to evaluate the vascular anatomy. Carotid stenosis measurements (when applicable) are obtained utilizing NASCET criteria, using the distal internal carotid diameter as the denominator. CONTRAST:  Dose is currently not known COMPARISON:  Head CT reported separately FINDINGS: CTA NECK FINDINGS Aortic arch: Negative.  Three vessel branching Right carotid system: No atheromatous changes, dissection, or beading. Left carotid system: Mild motion artifact which could account for the mildly smudged appearance of the left ICA proximally. Vertebral arteries: No proximal subclavian stenosis. Codominant vertebral arteries that are smooth and widely patent to the dura. Skeleton: No acute or aggressive finding Other neck: Negative Upper chest: Negative Review of the MIP images confirms the above findings CTA HEAD FINDINGS Anterior circulation: Cut off at the left ICA terminus with visible luminal clot. There is continuation of non opacification throughout the left M1 segment which is clot by CT. Non opacification at the left A1 origin. No  contralateral branch occlusion is seen. Intravascular enhancement seen in the left operculum branches on the delayed phase. Posterior circulation: The vertebral and basilar arteries are smooth and widely patent. No branch occlusion, beading, or aneurysm. Venous sinuses: Patent Anatomic variants: None significant Review of the MIP images confirms the above findings These results were called by telephone at the time of interpretation on 05/19/2020 at 11:35 am to provider [facility] , who is already aware. IMPRESSION: 1. 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 2. Mildly indistinct appearance of the proximal left ICA is likely from motion artifact, consider cervical run. No atheromatous changes or vasculopathy seen in the neck. Electronically Signed   By: Marnee Spring M.D.   On: 05/19/2020 11:40   CT ANGIO CHEST PE W OR WO CONTRAST  Result Date: 05/28/2020 CLINICAL DATA:  24 year old female with tachycardia, COVID and positive D-dimer. EXAM: CT ANGIOGRAPHY CHEST WITH CONTRAST TECHNIQUE: Multidetector CT imaging of the chest was performed using the standard protocol during bolus administration of intravenous contrast. Multiplanar CT image reconstructions and MIPs were obtained to evaluate the vascular anatomy. CONTRAST:  60mL OMNIPAQUE IOHEXOL 350 MG/ML SOLN COMPARISON:  None. FINDINGS: Cardiovascular: This is a technically adequate study, but motion artifact does limit evaluation in some areas. Multiple pulmonary emboli are identified and within the LEFT LOWER lobe pulmonary artery, and multiple bilateral segmental LOWER lobe pulmonary arteries. Equivocal emboli within LEFT UPPER lobe segmental pulmonary arteries noted. Mild cardiomegaly is present.  RV/LV ratio equals 0.84. There is no evidence of thoracic aortic aneurysm or pericardial effusion. Mediastinum/Nodes: No enlarged mediastinal, hilar, or axillary lymph nodes. Thyroid gland, trachea, and esophagus  demonstrate no significant findings. Lungs/Pleura: No airspace disease, consolidation, mass or nodule. No pleural effusion or pneumothorax. Upper Abdomen: No acute abnormality. Musculoskeletal: No chest wall abnormality. No acute or significant osseous findings. Review of the MIP images confirms the above findings. IMPRESSION: 1. Bilateral segmental LOWER lobe pulmonary emboli, LEFT-greater-than-RIGHT. Equivocal segmental emboli within the LEFT UPPER lobe. 2. Mild cardiomegaly. Critical Value/emergent results were called by telephone at the time of interpretation on  05/28/2020 at 2:41 pm to provider Arlys John, nurse on this floor,, who verbally acknowledged these results. Electronically Signed   By: Harmon Pier M.D.   On: 05/28/2020 14:45   MR BRAIN WO CONTRAST  Result Date: 05/20/2020 CLINICAL DATA:  Sudden onset aphasia and right-sided weakness EXAM: MRI HEAD WITHOUT CONTRAST TECHNIQUE: Multiplanar, multiecho pulse sequences of the brain and surrounding structures were obtained without intravenous contrast. COMPARISON:  Head CT 05/20/2020 FINDINGS: Brain: Early subacute infarct of the left basal ganglia involving the posterior putamen and caudate body. There is subarachnoid hemorrhage along the interhemispheric fissure extending into the lateral ventricles. There is early communicating hydrocephalus as evidenced by slightly increased size of the temporal horns. There is no herniation or other mass effect Vascular: Normal flow voids Skull and upper cervical spine: Normal Sinuses/Orbits: Normal Other: Fluid in the nasopharynx. IMPRESSION: 1. Early subacute infarct of the left basal ganglia involving the posterior putamen and caudate body. 2. Subarachnoid hemorrhage along the interhemispheric fissure extending into the lateral ventricles. 3. 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 Electronically Signed   By: Deatra Robinson M.D.   On: 05/20/2020 02:23   IR CT  Head Ltd  Result Date: 05/21/2020 INDICATION: 24 year old female with a history of acute left MCA syndrome, presents for mechanical thrombectomy COVID test was confirmed positive after initiation of the case EXAM: ULTRASOUND-GUIDED RIGHT COMMON FEMORAL ARTERY ACCESS CERVICAL AND CEREBRAL ANGIOGRAM MECHANICAL THROMBECTOMY LEFT MCA ANGIO-SEAL FOR HEMOSTASIS COMPARISON:  CT imaging same day MEDICATIONS: 200 mcg intra arterial nitroglycerin. ANESTHESIA/SEDATION: The anesthesia team was present to provide general endotracheal tube anesthesia and for patient monitoring during the procedure. Intubation was performed in negative pressure [place] in neuro IR holding. Left radial arterial line was performed by the anesthesia team. Interventional neuro radiology nursing staff was also present. CONTRAST:  150 cc FLUOROSCOPY TIME:  Fluoroscopy Time: 53 minutes 36 seconds (1651 mGy). COMPLICATIONS: SIR LEVEL C - Requires therapy, minor hospitalization (<48 hrs). Subarachnoid hemorrhage TECHNIQUE: Informed written consent was obtained from the patient's family after a thorough discussion of the procedural risks, benefits and alternatives. Specific risks discussed include: Bleeding, infection, contrast reaction, kidney injury/failure, need for further procedure/surgery, arterial injury or dissection, embolization to new territory, intracranial hemorrhage (10-15% risk), neurologic deterioration, cardiopulmonary collapse, death. All questions were addressed. Maximal Sterile Barrier Technique was utilized including during the procedure including caps, mask, sterile gowns, sterile gloves, sterile drape, hand hygiene and skin antiseptic. A timeout was performed prior to the initiation of the procedure. The anesthesia team was present to provide general endotracheal tube anesthesia and for patient monitoring during the procedure. Interventional neuro radiology nursing staff was also present. FINDINGS: Initial Findings: Left common carotid  artery: ICA patent with no tortuosity and no significant atherosclerosis. Separate branch from the aortic arch. Left external carotid artery: Patent with antegrade flow. Left internal carotid artery: The common carotid artery is relatively straight with no beading or significant narrowing. The AP image on the initial image demonstrates perhaps a slight irregularity on the lateral intimal surface which may correspond to the coronal reformatted images on the comparison CT preprocedure (image 25 of series 9). Given that there was no flow limiting problem, we elected to proceed with the thrombectomy Left MCA: The vertical horizontal petrous segment patent. Segment lacerum patent. Cavernous and supraclinoid patent. Occlusion of the proximal M1 segment with visible filling defect compatible with the findings on the CT. Left ACA: A1 is patent. There is a nearly  180 degree angulation of the A1 origin. Patent anterior communicating artery. There has been migration of the thromboembolism through the terminal segment into the A2 segment compared to the prior CT. Procedural findings: Baseline MCA flow: TICI 0 Left MCA: After 1 pass, there is no significant restoration of flow. Upon placement of the stent tree bronch the second attempt, and angiogram demonstrated no significant flow through the solitaire. After the second pass, complete restoration of flow through the MCA territory was achieved, TICI 3. During this time, there was further migration of the A2 embolus to the pericallosal artery, with the ultimate occlusion approximately level to the coronal suture. Given that the anatomy would not allow and navigation of the microcatheter and intermediate catheter into the segment, and given the perceived density of the ACA thromboembolism, we ultimately withdrew from further attempts thrombectomy when the wire and microcatheter would not pass through the face of the thrombus. Final angiogram demonstrated no evidence of  extravasation of contrast at this level. The flat panel CT demonstrates inter hemispheric subarachnoid hemorrhage without mass effect. Angiogram of the cervical ICA upon withdrawal of the balloon catheter demonstrated long segment irregularity, with variations in the diameter extending from beyond the bifurcation to the horizontal petrous segment. The irregularity was not only spatial but variations in temporal appearance, as the narrowing worsen to greater than 80%, such as that demonstrated on image 5/7 angiogram 23. After 20 minutes of placing orogastric tube and giving dual antiplatelets dosage, anticipating stenting, a repeat angiogram demonstrated improvement in the appearance of the diameter, image 5/7 of angiogram 24. At this time we elected not to stent. PROCEDURE: The anesthesia team was present to provide general endotracheal tube anesthesia and for patient monitoring during the procedure. Intubation was performed in negative pressure [place] in neuro IR holding. Interventional neuro radiology nursing staff was also present. Ultrasound survey of the right inguinal region was performed with images stored and sent to PACs. 11 blade scalpel was used to make a small incision. Blunt dissection was performed with US guidance. A micropuncture needle was used access the right common femoral artery under ultrasound. With excellent arterial blood flow returned, an .018 micro wire was passed through the needle, observed to enter the abdominal aorta under fluoroscopy. The needle was removed, and a micropuncture sheath was placed over the wire. The inner dilator and wire were removed, and an 035 wire was advanced under fluoroscopy into the abdominal aorta. The sheath was removed and a 25cm 30F straight vascular sheath was placed. The dilator was removed and the sheath was flushed. Sheath was attached to pressurized and heparinized saline bag for constant forward flow. A coaxial system was then advanced over the 035 wire.  This included a 95cm 087 "Walrus" balloon guide with coaxial 125cm Berenstein diagnostic catheter. This was advanced to the proximal descending thoracic aorta. Wire was then removed. Double flush of the catheter was performed. Catheter was then used to select the left common carotid artery. Angiogram was performed. Using roadmap technique, the catheter was advanced over a standard glide wire into left cervical ICA, with luminal position achieved of the guide catheter. Wire was removed and angiogram was performed of the left carotid artery. Initial angiogram revealed no intimal/luminal irregularity and we proceeded with placement of the balloon guide into the distal cervical ICA. The balloon guide and the parents teen catheter were advanced with a Glidewire into the distal cervical segment of the ICA. Glidewire and the Berenstein catheter were gently removed and double flush  was performed. Formal angiogram was performed. Road map function was used once the occluded vessel was identified. Copious back flush was performed and the balloon catheter was attached to heparinized and pressurized saline bag for forward flow. A second coaxial system was then advanced through the balloon catheter, which included the selected intermediate catheter, microcatheter, and microwire. In this scenario, the set up included intermediate catheter, large-bore aspiration catheter, 135 cm, a Trevo Provue18 microcatheter, and 014 synchro soft wire. This system was advanced through the balloon guide catheter under the road-map function, with adequate back-flush at the rotating hemostatic valve at that back end of the balloon guide. Microcatheter and the intermediate catheter system were advanced through the terminal ICA and MCA to the level of the occlusion. The micro wire was then carefully advanced through the occluded segment. Microcatheter was then manipulated through the occluded segment and the wire was removed with saline drip at the  hub. Blood was then aspirated through the hub of the microcatheter, and a gentle contrast injection was performed confirming intraluminal position. A rotating hemostatic valve was then attached to the back end of the microcatheter, and a pressurized and heparinized saline bag was attached to the catheter. 4 x 40 solitaire device was then selected. Back flush was achieved at the rotating hemostatic valve, and then the device was gently advanced through the microcatheter to the distal end. The retriever was then unsheathed by withdrawing the microcatheter under fluoroscopy. Once the retriever was completely unsheathed, the microcatheter was carefully stripped from the delivery device. A 3 minute time interval was observed. The balloon at the balloon guide catheter was then inflated under fluoroscopy for proximal flow arrest. Constant aspiration using the proprietary engine was then performed at the intermediate catheter, as the retriever was gently and slowly withdrawn with fluoroscopic observation. Once the retriever was "corked" within the tip of the intermediate catheter, both were removed from the system. Free aspiration was confirmed at the hub of the balloon guide catheter, with free blood return confirmed. The balloon was then deflated, and a control angiogram was performed. The M1 segment remained occluded after the first pass. We then attempted a second pass. Microcatheter and the intermediate catheter system were again advanced through the terminal ICA and MCA to the level of the occlusion. The micro wire was then carefully advanced through the occluded segment. Microcatheter was then manipulated through the occluded segment and the wire was removed with saline drip at the hub. Blood was then aspirated through the hub of the microcatheter, and a gentle contrast injection was performed confirming intraluminal position. A rotating hemostatic valve was then attached to the back end of the microcatheter, and a  pressurized and heparinized saline bag was attached to the catheter. 4 x 40 solitaire device was again used. Back flush was achieved at the rotating hemostatic valve, and then the device was gently advanced through the microcatheter to the distal end. The retriever was then unsheathed by withdrawing the microcatheter under fluoroscopy. Once the retriever was completely unsheathed, the microcatheter was carefully stripped from the delivery device. Control angiogram was performed from the intermediate catheter, demonstrating that there was no flow through the open solitaire. A 3 minute time interval was observed. The balloon at the balloon guide catheter was then inflated under fluoroscopy for proximal flow arrest. Aspiration engine was turned on. Intermediate catheter was gently advanced over the proximal solitaire to the level of the occlusion, with flow arrest. Constant aspiration using the proprietary engine was then performed at  the intermediate catheter, as both the retriever and the intermediate catheter were gently and slowly withdrawn with fluoroscopic observation. Once the retriever was "corked" within the tip of the intermediate catheter, both were removed from the system. Free aspiration was confirmed at the hub of the balloon guide catheter, with free blood return confirmed. The balloon was then deflated, and a control angiogram was performed. Restoration of left MCA flow was confirmed. The initial left A2 segment occlusion was observed to have migrated distally to the pericallosal artery. Given the patient's young age we decided to proceed with attempt at mechanical thrombectomy of this ACA embolus. ACA thrombectomy attempt: The walrus balloon catheter remained in the distal cervical segment, proximal to the horizontal petrous segment. We advanced a coaxial wire and catheter system which included 014 synchro soft wire, Zoom 35 intermediate catheter, and tree though pro view 18 microcatheter. This was  advanced through the cavernous segment to the supraclinoid segment. This catheter and wire combination failed to engage the angulated A1 origin. We then attempted a coaxial system which included CT 5 intermediate catheter, 150cm phenom 21 microcatheter, and synchro soft wire. This coaxial system was advanced through the cavernous segment, supraclinoid segment, and failed to engage the A1 segment. Various micro wires were then used including Aristotle wire, and double angle headliner wire. The 014 headliner wire was ultimately successful engaging the A1 segment, with a pericallosal position achieved. The intermediate catheter would not make the nearly 180 degree turn into the A1 segment. After several attempts at achieving n/a 1 position with the intermediate catheter, we decided that we could attempt to use an anchoring technique by deploying stent tree verb across the embolism, and then bringing the intermediate catheter over the microcatheter and stent deployed stent tree verb. This would require, however, passing through the embolus with the microcatheter to deployed the stent tree verge. The microcatheter and microwire combination would not pass gently through the thromboembolism within the pericallosal artery, with quite a dense character observed when the wire and microcatheter were at the face of the thrombus. Ultimately we decided to withdraw from this attempt given the difficulty, and the apparent reactivity of the patient's carotid artery as we observed spasm and irregularity at the skull base on each subsequent control angiogram. The microcatheter was withdrawn to the cavernous segment of the ICA. Wire was removed. Exchange length floppy tip transcend wire was then placed through the microcatheter into a safe position. The balloon guide catheter was then withdrawn into the common carotid artery and angiogram was performed. This revealed irregularity and narrowing in the proximal ICA, which continued with  multiple variation in diameter through the cervical ICA to the distal segment at the site of the prior inflated balloon. These findings were discussed with the neurology team, with concern for dissection, as there was irregularity at this segment on the preprocedural CT scan, indicating possible vasculitis/dissection as not only the current imaging diagnosis, but potentially site of the thromboembolism origin. At this point we performed flat panel CT scan to observe for ICH/S AH, as treatment with a stent would require dual anti-platelet therapy. Flat panel CT demonstrated thin subarachnoid hemorrhage in the inter hemispheric fissure. Interval angiogram of the ICA was performed to determine the need for any possible stenting. 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 This imaging finding was concerning for progression of a possible intramural hematoma/dissection and at this point we elected to move forward with stenting.  At least 20 minutes were required to place an orogastric tube and then deliver our prescribed dose of aspirin and Plavix. Once these events had occurred, repeat angiogram was performed. Interestingly this image revealed significant improvement at the cervical segment, with residual irregularity (image XA #24). At this point we interpreted this as either improving vaso spasm or improving sequela of vasculitis/dissection. Given the flat panel head CT findings and the desire to avoid dual anti-platelet therapy long-term, we elected to withdraw from the case at this point with no stent deployed. Balloon guide was then removed, with all catheters and wires removed. The skin at the puncture site was then cleaned with Chlorhexidine. The 8 French sheath was removed and an 60F angioseal was deployed. Patient tolerated the procedure well and remained hemodynamically stable throughout. Blood loss estimated at 150 cc. IMPRESSION: Status post  ultrasound guided access right common femoral artery for cervical/cerebral angiogram and mechanical thrombectomy of left MCA restoring complete flow to the left MCA territory, and failed attempt at revascularization of left pericallosal thromboembolism. Flat panel CT at the completion demonstrates thin subarachnoid hemorrhage in the anterior inter hemispheric fissure. Angio-Seal deployed for hemostasis. Signed, Yvone Neu. Reyne Dumas, RPVI [facility] Radiology PLAN: The patient will remain intubated, as she remains COVID status unknown ICU status Target systolic blood pressure of 140-160 Right hip straight time 6 hours Frequent neurovascular checks Repeat neurologic imaging with CT and/MRI at the discretion of neurology team Electronically Signed   By: Gilmer Mor D.O.   On: 05/21/2020 08:57   IR CT Head Ltd  Result Date: 05/21/2020 INDICATION: 24 year old female with a history of acute left MCA syndrome, presents for mechanical thrombectomy COVID test was confirmed positive after initiation of the case EXAM: ULTRASOUND-GUIDED RIGHT COMMON FEMORAL ARTERY ACCESS CERVICAL AND CEREBRAL ANGIOGRAM MECHANICAL THROMBECTOMY LEFT MCA ANGIO-SEAL FOR HEMOSTASIS COMPARISON:  CT imaging same day MEDICATIONS: 200 mcg intra arterial nitroglycerin. ANESTHESIA/SEDATION: The anesthesia team was present to provide general endotracheal tube anesthesia and for patient monitoring during the procedure. Intubation was performed in negative pressure Bay in neuro IR holding. Left radial arterial line was performed by the anesthesia team. Interventional neuro radiology nursing staff was also present. CONTRAST:  150 cc FLUOROSCOPY TIME:  Fluoroscopy Time: 53 minutes 36 seconds (1651 mGy). COMPLICATIONS: SIR LEVEL C - Requires therapy, minor hospitalization (<48 hrs). Subarachnoid hemorrhage TECHNIQUE: Informed written consent was obtained from the patient's family after a thorough discussion  of the procedural risks, benefits and alternatives. Specific risks discussed include: Bleeding, infection, contrast reaction, kidney injury/failure, need for further procedure/surgery, arterial injury or dissection, embolization to new territory, intracranial hemorrhage (10-15% risk), neurologic deterioration, cardiopulmonary collapse, death. All questions were addressed. Maximal Sterile Barrier Technique was utilized including during the procedure including caps, mask, sterile gowns, sterile gloves, sterile drape, hand hygiene and skin antiseptic. A timeout was performed prior to the initiation of the procedure. The anesthesia team was present to provide general endotracheal tube anesthesia and for patient monitoring during the procedure. Interventional neuro radiology nursing staff was also present. FINDINGS: Initial Findings: Left common carotid artery: ICA patent with no tortuosity and no significant atherosclerosis. Separate branch from the aortic arch. Left external carotid artery: Patent with antegrade flow. Left internal carotid artery: The common carotid artery is relatively straight with no beading or significant narrowing. The AP image on the initial image demonstrates perhaps a slight irregularity on the lateral intimal surface which may correspond to the coronal reformatted images on the  comparison CT preprocedure (image 25 of series 9). Given that there was no flow limiting problem, we elected to proceed with the thrombectomy Left MCA: The vertical horizontal petrous segment patent. Segment lacerum patent. Cavernous and supraclinoid patent. Occlusion of the proximal M1 segment with visible filling defect compatible with the findings on the CT. Left ACA: A1 is patent. There is a nearly 180 degree angulation of the A1 origin. Patent anterior communicating artery. There has been migration of the thromboembolism through the terminal segment into the A2 segment compared to the prior CT. Procedural findings:  Baseline MCA flow: TICI 0 Left MCA: After 1 pass, there is no significant restoration of flow. Upon placement of the stent tree bronch the second attempt, and angiogram demonstrated no significant flow through the solitaire. After the second pass, complete restoration of flow through the MCA territory was achieved, TICI 3. During this time, there was further migration of the A2 embolus to the pericallosal artery, with the ultimate occlusion approximately level to the coronal suture. Given that the anatomy would not allow and navigation of the microcatheter and intermediate catheter into the segment, and given the perceived density of the ACA thromboembolism, we ultimately withdrew from further attempts thrombectomy when the wire and microcatheter would not pass through the face of the thrombus. Final angiogram demonstrated no evidence of extravasation of contrast at this level. The flat panel CT demonstrates inter hemispheric subarachnoid hemorrhage without mass effect. Angiogram of the cervical ICA upon withdrawal of the balloon catheter demonstrated long segment irregularity, with variations in the diameter extending from beyond the bifurcation to the horizontal petrous segment. The irregularity was not only spatial but variations in temporal appearance, as the narrowing worsen to greater than 80%, such as that demonstrated on image 5/7 angiogram 23. After 20 minutes of placing orogastric tube and giving dual antiplatelets dosage, anticipating stenting, a repeat angiogram demonstrated improvement in the appearance of the diameter, image 5/7 of angiogram 24. At this time we elected not to stent. PROCEDURE: The anesthesia team was present to provide general endotracheal tube anesthesia and for patient monitoring during the procedure. Intubation was performed in negative pressure Bay in neuro IR holding. Interventional neuro radiology nursing staff was also present. Ultrasound survey of the right inguinal region was  performed with images stored and sent to PACs. 11 blade scalpel was used to make a small incision. Blunt dissection was performed with US guidance. A micropuncture needle was used access the right common femoral artery under ultrasound. With excellent arterial blood flow returned, an .018 micro wire was passed through the needle, observed to enter the abdominal aorta under fluoroscopy. The needle was removed, and a micropuncture sheath was placed over the wire. The inner dilator and wire were removed, and an 035 wire was advanced under fluoroscopy into the abdominal aorta. The sheath was removed and a 25cm 84F straight vascular sheath was placed. The dilator was removed and the sheath was flushed. Sheath was attached to pressurized and heparinized saline bag for constant forward flow. A coaxial system was then advanced over the 035 wire. This included a 95cm 087 "Walrus" balloon guide with coaxial 125cm Berenstein diagnostic catheter. This was advanced to the proximal descending thoracic aorta. Wire was then removed. Double flush of the catheter was performed. Catheter was then used to select the left common carotid artery. Angiogram was performed. Using roadmap technique, the catheter was advanced over a standard glide wire into left cervical ICA, with luminal position achieved of the guide catheter.  Wire was removed and angiogram was performed of the left carotid artery. Initial angiogram revealed no intimal/luminal irregularity and we proceeded with placement of the balloon guide into the distal cervical ICA. The balloon guide and the parents teen catheter were advanced with a Glidewire into the distal cervical segment of the ICA. Glidewire and the Berenstein catheter were gently removed and double flush was performed. Formal angiogram was performed. Road map function was used once the occluded vessel was identified. Copious back flush was performed and the balloon catheter was attached to heparinized and  pressurized saline bag for forward flow. A second coaxial system was then advanced through the balloon catheter, which included the selected intermediate catheter, microcatheter, and microwire. In this scenario, the set up included intermediate catheter, large-bore aspiration catheter, 135 cm, a Trevo Provue18 microcatheter, and 014 synchro soft wire. This system was advanced through the balloon guide catheter under the road-map function, with adequate back-flush at the rotating hemostatic valve at that back end of the balloon guide. Microcatheter and the intermediate catheter system were advanced through the terminal ICA and MCA to the level of the occlusion. The micro wire was then carefully advanced through the occluded segment. Microcatheter was then manipulated through the occluded segment and the wire was removed with saline drip at the hub. Blood was then aspirated through the hub of the microcatheter, and a gentle contrast injection was performed confirming intraluminal position. A rotating hemostatic valve was then attached to the back end of the microcatheter, and a pressurized and heparinized saline bag was attached to the catheter. 4 x 40 solitaire device was then selected. Back flush was achieved at the rotating hemostatic valve, and then the device was gently advanced through the microcatheter to the distal end. The retriever was then unsheathed by withdrawing the microcatheter under fluoroscopy. Once the retriever was completely unsheathed, the microcatheter was carefully stripped from the delivery device. A 3 minute time interval was observed. The balloon at the balloon guide catheter was then inflated under fluoroscopy for proximal flow arrest. Constant aspiration using the proprietary engine was then performed at the intermediate catheter, as the retriever was gently and slowly withdrawn with fluoroscopic observation. Once the retriever was "corked" within the tip of the intermediate catheter, both  were removed from the system. Free aspiration was confirmed at the hub of the balloon guide catheter, with free blood return confirmed. The balloon was then deflated, and a control angiogram was performed. The M1 segment remained occluded after the first pass. We then attempted a second pass. Microcatheter and the intermediate catheter system were again advanced through the terminal ICA and MCA to the level of the occlusion. The micro wire was then carefully advanced through the occluded segment. Microcatheter was then manipulated through the occluded segment and the wire was removed with saline drip at the hub. Blood was then aspirated through the hub of the microcatheter, and a gentle contrast injection was performed confirming intraluminal position. A rotating hemostatic valve was then attached to the back end of the microcatheter, and a pressurized and heparinized saline bag was attached to the catheter. 4 x 40 solitaire device was again used. Back flush was achieved at the rotating hemostatic valve, and then the device was gently advanced through the microcatheter to the distal end. The retriever was then unsheathed by withdrawing the microcatheter under fluoroscopy. Once the retriever was completely unsheathed, the microcatheter was carefully stripped from the delivery device. Control angiogram was performed from the intermediate catheter, demonstrating that  there was no flow through the open solitaire. A 3 minute time interval was observed. The balloon at the balloon guide catheter was then inflated under fluoroscopy for proximal flow arrest. Aspiration engine was turned on. Intermediate catheter was gently advanced over the proximal solitaire to the level of the occlusion, with flow arrest. Constant aspiration using the proprietary engine was then performed at the intermediate catheter, as both the retriever and the intermediate catheter were gently and slowly withdrawn with fluoroscopic observation. Once  the retriever was "corked" within the tip of the intermediate catheter, both were removed from the system. Free aspiration was confirmed at the hub of the balloon guide catheter, with free blood return confirmed. The balloon was then deflated, and a control angiogram was performed. Restoration of left MCA flow was confirmed. The initial left A2 segment occlusion was observed to have migrated distally to the pericallosal artery. Given the patient's young age we decided to proceed with attempt at mechanical thrombectomy of this ACA embolus. ACA thrombectomy attempt: The walrus balloon catheter remained in the distal cervical segment, proximal to the horizontal petrous segment. We advanced a coaxial wire and catheter system which included 014 synchro soft wire, Zoom 35 intermediate catheter, and tree though pro view 18 microcatheter. This was advanced through the cavernous segment to the supraclinoid segment. This catheter and wire combination failed to engage the angulated A1 origin. We then attempted a coaxial system which included CT 5 intermediate catheter, 150cm phenom 21 microcatheter, and synchro soft wire. This coaxial system was advanced through the cavernous segment, supraclinoid segment, and failed to engage the A1 segment. Various micro wires were then used including Aristotle wire, and double angle headliner wire. The 014 headliner wire was ultimately successful engaging the A1 segment, with a pericallosal position achieved. The intermediate catheter would not make the nearly 180 degree turn into the A1 segment. After several attempts at achieving n/a 1 position with the intermediate catheter, we decided that we could attempt to use an anchoring technique by deploying stent tree verb across the embolism, and then bringing the intermediate catheter over the microcatheter and stent deployed stent tree verb. This would require, however, passing through the embolus with the microcatheter to deployed the stent  tree verge. The microcatheter and microwire combination would not pass gently through the thromboembolism within the pericallosal artery, with quite a dense character observed when the wire and microcatheter were at the face of the thrombus. Ultimately we decided to withdraw from this attempt given the difficulty, and the apparent reactivity of the patient's carotid artery as we observed spasm and irregularity at the skull base on each subsequent control angiogram. The microcatheter was withdrawn to the cavernous segment of the ICA. Wire was removed. Exchange length floppy tip transcend wire was then placed through the microcatheter into a safe position. The balloon guide catheter was then withdrawn into the common carotid artery and angiogram was performed. This revealed irregularity and narrowing in the proximal ICA, which continued with multiple variation in diameter through the cervical ICA to the distal segment at the site of the prior inflated balloon. These findings were discussed with the neurology team, with concern for dissection, as there was irregularity at this segment on the preprocedural CT scan, indicating possible vasculitis/dissection as not only the current imaging diagnosis, but potentially site of the thromboembolism origin. At this point we performed flat panel CT scan to observe for ICH/S AH, as treatment with a stent would require dual anti-platelet therapy. Flat panel CT  demonstrated thin subarachnoid hemorrhage in the inter hemispheric fissure. Interval angiogram of the ICA was performed to determine the need for any possible stenting. 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 This imaging finding was concerning for progression of a possible intramural hematoma/dissection and at this point we elected to move forward with stenting. At least 20 minutes were required to place an orogastric tube and then deliver our  prescribed dose of aspirin and Plavix. Once these events had occurred, repeat angiogram was performed. Interestingly this image revealed significant improvement at the cervical segment, with residual irregularity (image XA #24). At this point we interpreted this as either improving vaso spasm or improving sequela of vasculitis/dissection. Given the flat panel head CT findings and the desire to avoid dual anti-platelet therapy long-term, we elected to withdraw from the case at this point with no stent deployed. Balloon guide was then removed, with all catheters and wires removed. The skin at the puncture site was then cleaned with Chlorhexidine. The 8 French sheath was removed and an 62F angioseal was deployed. Patient tolerated the procedure well and remained hemodynamically stable throughout. Blood loss estimated at 150 cc. IMPRESSION: Status post ultrasound guided access right common femoral artery for cervical/cerebral angiogram and mechanical thrombectomy of left MCA restoring complete flow to the left MCA territory, and failed attempt at revascularization of left pericallosal thromboembolism. Flat panel CT at the completion demonstrates thin subarachnoid hemorrhage in the anterior inter hemispheric fissure. Angio-Seal deployed for hemostasis. Signed, Yvone Neu. Reyne Dumas, RPVI [facility] Radiology PLAN: The patient will remain intubated, as she remains COVID status unknown ICU status Target systolic blood pressure of 140-160 Right hip straight time 6 hours Frequent neurovascular checks Repeat neurologic imaging with CT and/MRI at the discretion of neurology team Electronically Signed   By: Gilmer Mor D.O.   On: 05/21/2020 08:57   IR US Guide Vasc Access Right  Result Date: 05/21/2020 INDICATION: 24 year old female with a history of acute left MCA syndrome, presents for mechanical thrombectomy COVID test was confirmed positive after initiation of the case  EXAM: ULTRASOUND-GUIDED RIGHT COMMON FEMORAL ARTERY ACCESS CERVICAL AND CEREBRAL ANGIOGRAM MECHANICAL THROMBECTOMY LEFT MCA ANGIO-SEAL FOR HEMOSTASIS COMPARISON:  CT imaging same day MEDICATIONS: 200 mcg intra arterial nitroglycerin. ANESTHESIA/SEDATION: The anesthesia team was present to provide general endotracheal tube anesthesia and for patient monitoring during the procedure. Intubation was performed in negative pressure Bay in neuro IR holding. Left radial arterial line was performed by the anesthesia team. Interventional neuro radiology nursing staff was also present. CONTRAST:  150 cc FLUOROSCOPY TIME:  Fluoroscopy Time: 53 minutes 36 seconds (1651 mGy). COMPLICATIONS: SIR LEVEL C - Requires therapy, minor hospitalization (<48 hrs). Subarachnoid hemorrhage TECHNIQUE: Informed written consent was obtained from the patient's family after a thorough discussion of the procedural risks, benefits and alternatives. Specific risks discussed include: Bleeding, infection, contrast reaction, kidney injury/failure, need for further procedure/surgery, arterial injury or dissection, embolization to new territory, intracranial hemorrhage (10-15% risk), neurologic deterioration, cardiopulmonary collapse, death. All questions were addressed. Maximal Sterile Barrier Technique was utilized including during the procedure including caps, mask, sterile gowns, sterile gloves, sterile drape, hand hygiene and skin antiseptic. A timeout was performed prior to the initiation of the procedure. The anesthesia team was present to provide general endotracheal tube anesthesia and for patient monitoring during the procedure. Interventional neuro radiology nursing staff was also present. FINDINGS: Initial Findings: Left common carotid artery: ICA  patent with no tortuosity and no significant atherosclerosis. Separate branch from the aortic arch. Left external carotid artery: Patent with antegrade flow. Left internal carotid artery: The common  carotid artery is relatively straight with no beading or significant narrowing. The AP image on the initial image demonstrates perhaps a slight irregularity on the lateral intimal surface which may correspond to the coronal reformatted images on the comparison CT preprocedure (image 25 of series 9). Given that there was no flow limiting problem, we elected to proceed with the thrombectomy Left MCA: The vertical horizontal petrous segment patent. Segment lacerum patent. Cavernous and supraclinoid patent. Occlusion of the proximal M1 segment with visible filling defect compatible with the findings on the CT. Left ACA: A1 is patent. There is a nearly 180 degree angulation of the A1 origin. Patent anterior communicating artery. There has been migration of the thromboembolism through the terminal segment into the A2 segment compared to the prior CT. Procedural findings: Baseline MCA flow: TICI 0 Left MCA: After 1 pass, there is no significant restoration of flow. Upon placement of the stent tree bronch the second attempt, and angiogram demonstrated no significant flow through the solitaire. After the second pass, complete restoration of flow through the MCA territory was achieved, TICI 3. During this time, there was further migration of the A2 embolus to the pericallosal artery, with the ultimate occlusion approximately level to the coronal suture. Given that the anatomy would not allow and navigation of the microcatheter and intermediate catheter into the segment, and given the perceived density of the ACA thromboembolism, we ultimately withdrew from further attempts thrombectomy when the wire and microcatheter would not pass through the face of the thrombus. Final angiogram demonstrated no evidence of extravasation of contrast at this level. The flat panel CT demonstrates inter hemispheric subarachnoid hemorrhage without mass effect. Angiogram of the cervical ICA upon withdrawal of the balloon catheter demonstrated  long segment irregularity, with variations in the diameter extending from beyond the bifurcation to the horizontal petrous segment. The irregularity was not only spatial but variations in temporal appearance, as the narrowing worsen to greater than 80%, such as that demonstrated on image 5/7 angiogram 23. After 20 minutes of placing orogastric tube and giving dual antiplatelets dosage, anticipating stenting, a repeat angiogram demonstrated improvement in the appearance of the diameter, image 5/7 of angiogram 24. At this time we elected not to stent. PROCEDURE: The anesthesia team was present to provide general endotracheal tube anesthesia and for patient monitoring during the procedure. Intubation was performed in negative pressure [place] in neuro IR holding. Interventional neuro radiology nursing staff was also present. Ultrasound survey of the right inguinal region was performed with images stored and sent to PACs. 11 blade scalpel was used to make a small incision. Blunt dissection was performed with US guidance. A micropuncture needle was used access the right common femoral artery under ultrasound. With excellent arterial blood flow returned, an .018 micro wire was passed through the needle, observed to enter the abdominal aorta under fluoroscopy. The needle was removed, and a micropuncture sheath was placed over the wire. The inner dilator and wire were removed, and an 035 wire was advanced under fluoroscopy into the abdominal aorta. The sheath was removed and a 25cm 62F straight vascular sheath was placed. The dilator was removed and the sheath was flushed. Sheath was attached to pressurized and heparinized saline bag for constant forward flow. A coaxial system was then advanced over the 035 wire. This included a 95cm 087 "Walrus"  balloon guide with coaxial 125cm Berenstein diagnostic catheter. This was advanced to the proximal descending thoracic aorta. Wire was then removed. Double flush of the catheter was  performed. Catheter was then used to select the left common carotid artery. Angiogram was performed. Using roadmap technique, the catheter was advanced over a standard glide wire into left cervical ICA, with luminal position achieved of the guide catheter. Wire was removed and angiogram was performed of the left carotid artery. Initial angiogram revealed no intimal/luminal irregularity and we proceeded with placement of the balloon guide into the distal cervical ICA. The balloon guide and the parents teen catheter were advanced with a Glidewire into the distal cervical segment of the ICA. Glidewire and the Berenstein catheter were gently removed and double flush was performed. Formal angiogram was performed. Road map function was used once the occluded vessel was identified. Copious back flush was performed and the balloon catheter was attached to heparinized and pressurized saline bag for forward flow. A second coaxial system was then advanced through the balloon catheter, which included the selected intermediate catheter, microcatheter, and microwire. In this scenario, the set up included intermediate catheter, large-bore aspiration catheter, 135 cm, a Trevo Provue18 microcatheter, and 014 synchro soft wire. This system was advanced through the balloon guide catheter under the road-map function, with adequate back-flush at the rotating hemostatic valve at that back end of the balloon guide. Microcatheter and the intermediate catheter system were advanced through the terminal ICA and MCA to the level of the occlusion. The micro wire was then carefully advanced through the occluded segment. Microcatheter was then manipulated through the occluded segment and the wire was removed with saline drip at the hub. Blood was then aspirated through the hub of the microcatheter, and a gentle contrast injection was performed confirming intraluminal position. A rotating hemostatic valve was then attached to the back end of the  microcatheter, and a pressurized and heparinized saline bag was attached to the catheter. 4 x 40 solitaire device was then selected. Back flush was achieved at the rotating hemostatic valve, and then the device was gently advanced through the microcatheter to the distal end. The retriever was then unsheathed by withdrawing the microcatheter under fluoroscopy. Once the retriever was completely unsheathed, the microcatheter was carefully stripped from the delivery device. A 3 minute time interval was observed. The balloon at the balloon guide catheter was then inflated under fluoroscopy for proximal flow arrest. Constant aspiration using the proprietary engine was then performed at the intermediate catheter, as the retriever was gently and slowly withdrawn with fluoroscopic observation. Once the retriever was "corked" within the tip of the intermediate catheter, both were removed from the system. Free aspiration was confirmed at the hub of the balloon guide catheter, with free blood return confirmed. The balloon was then deflated, and a control angiogram was performed. The M1 segment remained occluded after the first pass. We then attempted a second pass. Microcatheter and the intermediate catheter system were again advanced through the terminal ICA and MCA to the level of the occlusion. The micro wire was then carefully advanced through the occluded segment. Microcatheter was then manipulated through the occluded segment and the wire was removed with saline drip at the hub. Blood was then aspirated through the hub of the microcatheter, and a gentle contrast injection was performed confirming intraluminal position. A rotating hemostatic valve was then attached to the back end of the microcatheter, and a pressurized and heparinized saline bag was attached to the catheter. 4  x 40 solitaire device was again used. Back flush was achieved at the rotating hemostatic valve, and then the device was gently advanced through the  microcatheter to the distal end. The retriever was then unsheathed by withdrawing the microcatheter under fluoroscopy. Once the retriever was completely unsheathed, the microcatheter was carefully stripped from the delivery device. Control angiogram was performed from the intermediate catheter, demonstrating that there was no flow through the open solitaire. A 3 minute time interval was observed. The balloon at the balloon guide catheter was then inflated under fluoroscopy for proximal flow arrest. Aspiration engine was turned on. Intermediate catheter was gently advanced over the proximal solitaire to the level of the occlusion, with flow arrest. Constant aspiration using the proprietary engine was then performed at the intermediate catheter, as both the retriever and the intermediate catheter were gently and slowly withdrawn with fluoroscopic observation. Once the retriever was "corked" within the tip of the intermediate catheter, both were removed from the system. Free aspiration was confirmed at the hub of the balloon guide catheter, with free blood return confirmed. The balloon was then deflated, and a control angiogram was performed. Restoration of left MCA flow was confirmed. The initial left A2 segment occlusion was observed to have migrated distally to the pericallosal artery. Given the patient's young age we decided to proceed with attempt at mechanical thrombectomy of this ACA embolus. ACA thrombectomy attempt: The walrus balloon catheter remained in the distal cervical segment, proximal to the horizontal petrous segment. We advanced a coaxial wire and catheter system which included 014 synchro soft wire, Zoom 35 intermediate catheter, and tree though pro view 18 microcatheter. This was advanced through the cavernous segment to the supraclinoid segment. This catheter and wire combination failed to engage the angulated A1 origin. We then attempted a coaxial system which included CT 5 intermediate catheter,  150cm phenom 21 microcatheter, and synchro soft wire. This coaxial system was advanced through the cavernous segment, supraclinoid segment, and failed to engage the A1 segment. Various micro wires were then used including Aristotle wire, and double angle headliner wire. The 014 headliner wire was ultimately successful engaging the A1 segment, with a pericallosal position achieved. The intermediate catheter would not make the nearly 180 degree turn into the A1 segment. After several attempts at achieving n/a 1 position with the intermediate catheter, we decided that we could attempt to use an anchoring technique by deploying stent tree verb across the embolism, and then bringing the intermediate catheter over the microcatheter and stent deployed stent tree verb. This would require, however, passing through the embolus with the microcatheter to deployed the stent tree verge. The microcatheter and microwire combination would not pass gently through the thromboembolism within the pericallosal artery, with quite a dense character observed when the wire and microcatheter were at the face of the thrombus. Ultimately we decided to withdraw from this attempt given the difficulty, and the apparent reactivity of the patient's carotid artery as we observed spasm and irregularity at the skull base on each subsequent control angiogram. The microcatheter was withdrawn to the cavernous segment of the ICA. Wire was removed. Exchange length floppy tip transcend wire was then placed through the microcatheter into a safe position. The balloon guide catheter was then withdrawn into the common carotid artery and angiogram was performed. This revealed irregularity and narrowing in the proximal ICA, which continued with multiple variation in diameter through the cervical ICA to the distal segment at the site of the prior inflated balloon. These findings  were discussed with the neurology team, with concern for dissection, as there was  irregularity at this segment on the preprocedural CT scan, indicating possible vasculitis/dissection as not only the current imaging diagnosis, but potentially site of the thromboembolism origin. At this point we performed flat panel CT scan to observe for ICH/S AH, as treatment with a stent would require dual anti-platelet therapy. Flat panel CT demonstrated thin subarachnoid hemorrhage in the inter hemispheric fissure. Interval angiogram of the ICA was performed to determine the need for any possible stenting. 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 This imaging finding was concerning for progression of a possible intramural hematoma/dissection and at this point we elected to move forward with stenting. At least 20 minutes were required to place an orogastric tube and then deliver our prescribed dose of aspirin and Plavix. Once these events had occurred, repeat angiogram was performed. Interestingly this image revealed significant improvement at the cervical segment, with residual irregularity (image XA #24). At this point we interpreted this as either improving vaso spasm or improving sequela of vasculitis/dissection. Given the flat panel head CT findings and the desire to avoid dual anti-platelet therapy long-term, we elected to withdraw from the case at this point with no stent deployed. Balloon guide was then removed, with all catheters and wires removed. The skin at the puncture site was then cleaned with Chlorhexidine. The 8 French sheath was removed and an 15F angioseal was deployed. Patient tolerated the procedure well and remained hemodynamically stable throughout. Blood loss estimated at 150 cc. IMPRESSION: Status post ultrasound guided access right common femoral artery for cervical/cerebral angiogram and mechanical thrombectomy of left MCA restoring complete flow to the left MCA territory, and failed attempt at revascularization  of left pericallosal thromboembolism. Flat panel CT at the completion demonstrates thin subarachnoid hemorrhage in the anterior inter hemispheric fissure. Angio-Seal deployed for hemostasis. Signed, Yvone Neu. Reyne Dumas, RPVI [facility] Radiology PLAN: The patient will remain intubated, as she remains COVID status unknown ICU status Target systolic blood pressure of 140-160 Right hip straight time 6 hours Frequent neurovascular checks Repeat neurologic imaging with CT and/MRI at the discretion of neurology team Electronically Signed   By: Gilmer Mor D.O.   On: 05/21/2020 08:57   DG Chest Port 1 View  Result Date: 05/31/2020 CLINICAL DATA:  Sob,code stroke ,COVID positive EXAM: PORTABLE CHEST 1 VIEW COMPARISON:  Chest radiograph 05/28/2020 FINDINGS: Stable cardiomediastinal contours with enlarged heart size. Minimal opacities at the right lung base likely reflecting atelectasis. Left lung is clear. No pneumothorax or significant pleural effusion. No acute finding in the visualized skeleton. IMPRESSION: Minimal opacities at the right lung base likely reflecting atelectasis. Electronically Signed   By: Emmaline Kluver M.D.   On: 05/31/2020 09:08   DG CHEST PORT 1 VIEW  Result Date: 05/28/2020 CLINICAL DATA:  Fever.  COVID positive. EXAM: PORTABLE CHEST 1 VIEW COMPARISON:  05/22/2020 FINDINGS: Midline trachea.  Normal heart size and mediastinal contours. Sharp costophrenic angles. No pneumothorax. Clear lungs. Mild right hemidiaphragm elevation. IMPRESSION: No active disease. Electronically Signed   By: Jeronimo Greaves M.D.   On: 05/28/2020 08:14   DG CHEST PORT 1 VIEW  Result Date: 05/22/2020 CLINICAL DATA:  COVID-19 positive. EXAM: PORTABLE CHEST 1 VIEW COMPARISON:  May 20, 2020. FINDINGS: Stable cardiomediastinal silhouette. Endotracheal and nasogastric tubes have been removed. No pneumothorax or pleural effusion is noted. Lungs are clear. Bony thorax  is unremarkable. IMPRESSION: No active disease. Electronically Signed   By: Lupita Raider M.D.   On: 05/22/2020 08:36   DG CHEST PORT 1 VIEW  Result Date: 05/20/2020 CLINICAL DATA:  CABG 19 positive, respiratory failure EXAM: PORTABLE CHEST 1 VIEW COMPARISON:  05/19/2020 chest radiograph. FINDINGS: Endotracheal tube tip is 4.1 cm above the carina. Enteric tube terminates in the gastric fundus. Stable cardiomediastinal silhouette with normal heart size. No pneumothorax. No pleural effusion. Mild platelike atelectasis in the mid to lower lungs. No pulmonary edema. IMPRESSION: 1. Well-positioned support structures. 2. Mild platelike atelectasis in the mid to lower lungs. Electronically Signed   By: Delbert Phenix M.D.   On: 05/20/2020 09:09   DG CHEST PORT 1 VIEW  Result Date: 05/19/2020 CLINICAL DATA:  LEFT MCA infarct and intracranial hemorrhage. Respiratory failure. COVID positive. EXAM: PORTABLE CHEST 1 VIEW COMPARISON:  11/07/2016 chest radiograph FINDINGS: An endotracheal tube is noted with tip 3 cm above the carina. An NG tube is noted coiled in the stomach. This is a mildly low volume film with mild bibasilar opacities which may represent atelectasis or airspace disease. No pleural effusion or pneumothorax. No acute bony abnormalities are present. IMPRESSION: 1. Endotracheal tube and NG tube placement. 2. Mildly low volume film with mild bibasilar opacities which may represent atelectasis or infection/pneumonia. Electronically Signed   By: Harmon Pier M.D.   On: 05/19/2020 18:03   DG Swallowing Func-Speech Pathology  Result Date: 05/22/2020 Objective Swallowing Evaluation: Type of Study: MBS-Modified Barium Swallow Study  Patient Details Name: Kaylei Frink MRN: [id number] Date of Birth: [birth year] Today's Date: 05/22/2020 Time: SLP Start Time (ACUTE ONLY): 1449 -SLP Stop Time (ACUTE ONLY): 1509 SLP Time Calculation (min) (ACUTE ONLY): 20 min Past Medical History: Past Medical History: Diagnosis Date   Anxiety   Panic attack  Past Surgical History: Past Surgical History: Procedure Laterality Date  IR CT HEAD LTD  05/19/2020  IR CT HEAD LTD  05/19/2020  IR PERCUTANEOUS ART THROMBECTOMY/INFUSION INTRACRANIAL INC DIAG ANGIO  05/19/2020  IR US GUIDE VASC ACCESS RIGHT  05/19/2020  RADIOLOGY WITH ANESTHESIA N/A 05/19/2020  Procedure: IR WITH ANESTHESIA;  Surgeon: Radiologist, Medication, MD;  Location: [facility] OR;  Service: Radiology;  Laterality: N/A; HPI: Pt is a 24 yo female presenting with sudden onset R sided weakness and aphasia. 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 She was intubated for thrombectomy 8/21. 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 MRI showed early subacute infarct of the left basal ganglia involving the posterior putamen and caudate body. Pt self-extubated 8/22. Pt tested (+) for COVID-19 but was asymptomatic upon arrival. PMH: panic attack, anxiety  Subjective: alert, cooperative, aphasic Assessment / Plan / Recommendation CHL IP CLINICAL IMPRESSIONS 05/22/2020 Clinical Impression Pt presents with a mild oral dysphagia, but with relatively intact pharyngeal function. She has reduced labial seal with anterior spillage that happens with thin liquids via cup and straw, and is not necessarily localized to one side of her mouth. She has lingual pumping, with more time needed to clear solids from her oral cavity compared to liquids. Lingual pumping with solids is sometimes moderately prolonged, and she is not able to clear the barium tablet before losing it sublingually. After she is able to retrieve it, she masticated it rather than trying again to swallow it whole. There is some premature spillage with thin liquids, at times filling the valleculae and pyriform sinuses and with varying amounts of time that passes  before she swallows, dependent upon how much longer it takes her to clear her mouth. Despite this, there is no aspiration across challenging. She had a few  instances of trace and transient penetration. Throat clearing was not noted with each episode of transient penetration, but was also not observed outside of these episodes. Recommend that pt begin Dys 3 (mechanical soft) diet and thin liquids.  SLP Visit Diagnosis Dysphagia, oral phase (R13.11) Attention and concentration deficit following -- Frontal lobe and executive function deficit following -- Impact on safety and function Mild aspiration risk   CHL IP TREATMENT RECOMMENDATION 05/22/2020 Treatment Recommendations Therapy as outlined in treatment plan below   Prognosis 05/22/2020 Prognosis for Safe Diet Advancement Good Barriers to Reach Goals Language deficits Barriers/Prognosis Comment -- CHL IP DIET RECOMMENDATION 05/22/2020 SLP Diet Recommendations Dysphagia 3 (Mech soft) solids;Thin liquid Liquid Administration via Straw;Cup Medication Administration Whole meds with puree Compensations Slow rate;Small sips/bites Postural Changes Seated upright at 90 degrees   CHL IP OTHER RECOMMENDATIONS 05/22/2020 Recommended Consults -- Oral Care Recommendations Oral care BID Other Recommendations --   CHL IP FOLLOW UP RECOMMENDATIONS 05/22/2020 Follow up Recommendations Inpatient Rehab   CHL IP FREQUENCY AND DURATION 05/22/2020 Speech Therapy Frequency (ACUTE ONLY) min 2x/week Treatment Duration 2 weeks      CHL IP ORAL PHASE 05/22/2020 Oral Phase Impaired Oral - Pudding Teaspoon -- Oral - Pudding Cup -- Oral - Honey Teaspoon -- Oral - Honey Cup -- Oral - Nectar Teaspoon -- Oral - Nectar Cup -- Oral - Nectar Straw -- Oral - Thin Teaspoon -- Oral - Thin Cup Left anterior bolus loss;Right anterior bolus loss;Lingual pumping;Reduced posterior propulsion;Delayed oral transit;Decreased bolus cohesion;Premature spillage Oral - Thin Straw Left anterior bolus loss;Right anterior bolus loss;Lingual pumping;Reduced posterior propulsion;Delayed oral transit;Decreased bolus cohesion;Premature spillage Oral - Puree Left anterior bolus  loss;Right anterior bolus loss;Lingual pumping;Reduced posterior propulsion;Delayed oral transit;Decreased bolus cohesion Oral - Mech Soft Left anterior bolus loss;Right anterior bolus loss;Lingual pumping;Reduced posterior propulsion;Delayed oral transit;Decreased bolus cohesion;Lingual/palatal residue Oral - Regular -- Oral - Multi-Consistency -- Oral - Pill Left anterior bolus loss;Right anterior bolus loss;Lingual pumping;Reduced posterior propulsion;Delayed oral transit;Pocketing in anterior sulcus Oral Phase - Comment --  CHL IP PHARYNGEAL PHASE 05/22/2020 Pharyngeal Phase Impaired Pharyngeal- Pudding Teaspoon -- Pharyngeal -- Pharyngeal- Pudding Cup -- Pharyngeal -- Pharyngeal- Honey Teaspoon -- Pharyngeal -- Pharyngeal- Honey Cup -- Pharyngeal -- Pharyngeal- Nectar Teaspoon -- Pharyngeal -- Pharyngeal- Nectar Cup -- Pharyngeal -- Pharyngeal- Nectar Straw -- Pharyngeal -- Pharyngeal- Thin Teaspoon -- Pharyngeal -- Pharyngeal- Thin Cup Penetration/Aspiration before swallow Pharyngeal Material enters airway, remains ABOVE vocal cords then ejected out Pharyngeal- Thin Straw Penetration/Aspiration before swallow Pharyngeal Material enters airway, remains ABOVE vocal cords then ejected out Pharyngeal- Puree WFL Pharyngeal -- Pharyngeal- Mechanical Soft WFL Pharyngeal -- Pharyngeal- Regular -- Pharyngeal -- Pharyngeal- Multi-consistency -- Pharyngeal -- Pharyngeal- Pill WFL Pharyngeal -- Pharyngeal Comment --  CHL IP CERVICAL ESOPHAGEAL PHASE 05/22/2020 Cervical Esophageal Phase WFL Pudding Teaspoon -- Pudding Cup -- Honey Teaspoon -- Honey Cup -- Nectar Teaspoon -- Nectar Cup -- Nectar Straw -- Thin Teaspoon -- Thin Cup -- Thin Straw -- Puree -- Mechanical Soft -- Regular -- Multi-consistency -- Pill -- Cervical Esophageal Comment -- Mahala Menghini., M.A. CCC-SLP Acute Rehabilitation Services Pager [phone] Office [phone] 05/22/2020, 3:28 PM              VAS [place] TRANSCRANIAL DOPPLER W BUBBLES  Result Date:  05/28/2020  Transcranial Doppler with Bubble Indications: Stroke. Comparison Study: No  prior study Performing Technologist: Gertie Fey MHA, RDMS, RVT, RDCS  Examination Guidelines: A complete evaluation includes B-mode imaging, spectral Doppler, color Doppler, and power Doppler as needed of all accessible portions of each vessel. Bilateral testing is considered an integral part of a complete examination. Limited examinations for reoccurring indications may be performed as noted.  Summary:  A vascular evaluation was performed. The left middle cerebral artery was studied. An IV was inserted into the patient's left PICC line. Verbal informed consent was obtained.  HITS (high intensity transient signals) were heard at rest, suggestive of a possible Spencer Grade 3 PFO. 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  Diagnosing physician: Delia Heady MD Electronically signed by Delia Heady MD on 05/28/2020 at 1:43:29 PM.    Final    ECHOCARDIOGRAM COMPLETE  Result Date: 05/20/2020    ECHOCARDIOGRAM REPORT   Patient Name:   Gail Montgomery Date of Exam: 05/20/2020 Medical Rec #:  [id number]    Height:       64.0 in Accession #:    [id number]   Weight:       185.6 lb Date of Birth:  [birth year]    BSA:          1.895 m Patient Age:    24 years     BP:           127/71 mmHg Patient Gender: F            HR:           64 bpm. Exam Location:  Inpatient Procedure: 2D Echo Indications:    Stroke  History:        Patient has no prior history of Echocardiogram examinations.                 Risk Factors:Current Smoker. Covid Positive.  Sonographer:    Jeryl Columbia Referring Phys: [phone] MCNEILL P KIRKPATRICK IMPRESSIONS  1. Left ventricular ejection fraction, by estimation, is 60 to 65%. The left ventricle has normal function. The left ventricle has no regional wall motion abnormalities. Left ventricular diastolic parameters were normal.   2. Right ventricular systolic function is normal. The right ventricular size is normal.  3. The mitral valve is normal in structure. Trivial mitral valve regurgitation. No evidence of mitral stenosis.  4. The aortic valve is normal in structure. Aortic valve regurgitation is not visualized. No aortic stenosis is present.  5. The inferior vena cava is normal in size with greater than 50% respiratory variability, suggesting right atrial pressure of 3 mmHg. Comparison(s): No prior Echocardiogram. Conclusion(s)/Recommendation(s): Normal biventricular function without evidence of hemodynamically significant valvular heart disease. No intracardiac source of embolism detected on this transthoracic study. A transesophageal echocardiogram is recommended to exclude cardiac source of embolism if clinically indicated. FINDINGS  Left Ventricle: Left ventricular ejection fraction, by estimation, is 60 to 65%. The left ventricle has normal function. The left ventricle has no regional wall motion abnormalities. The left ventricular internal cavity size was normal in size. There is  no left ventricular hypertrophy. Left ventricular diastolic parameters were normal. Right Ventricle: The right ventricular size is normal. No increase in right ventricular wall thickness. Right ventricular systolic function is normal. Left Atrium: Left atrial size was normal in size. Right Atrium: Right atrial size was normal in size. Pericardium: There is no evidence of pericardial effusion. Mitral Valve: The mitral valve is normal in structure. Normal mobility of  the mitral valve leaflets. Trivial mitral valve regurgitation. No evidence of mitral valve stenosis. Tricuspid Valve: The tricuspid valve is normal in structure. Tricuspid valve regurgitation is trivial. No evidence of tricuspid stenosis. Aortic Valve: The aortic valve is normal in structure. Aortic valve regurgitation is not visualized. No aortic stenosis is present. Pulmonic Valve: The  pulmonic valve was normal in structure. Pulmonic valve regurgitation is not visualized. No evidence of pulmonic stenosis. Aorta: The aortic root is normal in size and structure. Venous: The inferior vena cava is normal in size with greater than 50% respiratory variability, suggesting right atrial pressure of 3 mmHg. IAS/Shunts: No atrial level shunt detected by color flow Doppler.  LEFT VENTRICLE PLAX 2D LVIDd:         4.00 cm LVIDs:         2.60 cm LV PW:         1.20 cm LV IVS:        1.00 cm LVOT diam:     1.90 cm LVOT Area:     2.84 cm  RIGHT VENTRICLE TAPSE (M-mode): 2.6 cm LEFT ATRIUM             Index       RIGHT ATRIUM           Index LA diam:        3.00 cm 1.58 cm/m  RA Area:     14.30 cm LA Vol (A2C):   52.4 ml 27.65 ml/m RA Volume:   36.50 ml  19.26 ml/m LA Vol (A4C):   38.2 ml 20.15 ml/m LA Biplane Vol: 44.9 ml 23.69 ml/m   AORTA Ao Root diam: 2.70 cm  SHUNTS Systemic Diam: 1.90 cm Tobias Alexander MD Electronically signed by Tobias Alexander MD Signature Date/Time: 05/20/2020/4:13:45 PM    Final    IR PERCUTANEOUS ART THROMBECTOMY/INFUSION INTRACRANIAL INC DIAG ANGIO  Result Date: 05/21/2020 INDICATION: 24 year old female with a history of acute left MCA syndrome, presents for mechanical thrombectomy COVID test was confirmed positive after initiation of the case EXAM: ULTRASOUND-GUIDED RIGHT COMMON FEMORAL ARTERY ACCESS CERVICAL AND CEREBRAL ANGIOGRAM MECHANICAL THROMBECTOMY LEFT MCA ANGIO-SEAL FOR HEMOSTASIS COMPARISON:  CT imaging same day MEDICATIONS: 200 mcg intra arterial nitroglycerin. ANESTHESIA/SEDATION: The anesthesia team was present to provide general endotracheal tube anesthesia and for patient monitoring during the procedure. Intubation was performed in negative pressure [place] in neuro IR holding. Left radial arterial line was performed by the anesthesia team. Interventional neuro radiology nursing staff was also present. CONTRAST:  150 cc FLUOROSCOPY TIME:  Fluoroscopy Time: 53  minutes 36 seconds (1651 mGy). COMPLICATIONS: SIR LEVEL C - Requires therapy, minor hospitalization (<48 hrs). Subarachnoid hemorrhage TECHNIQUE: Informed written consent was obtained from the patient's family after a thorough discussion of the procedural risks, benefits and alternatives. Specific risks discussed include: Bleeding, infection, contrast reaction, kidney injury/failure, need for further procedure/surgery, arterial injury or dissection, embolization to new territory, intracranial hemorrhage (10-15% risk), neurologic deterioration, cardiopulmonary collapse, death. All questions were addressed. Maximal Sterile Barrier Technique was utilized including during the procedure including caps, mask, sterile gowns, sterile gloves, sterile drape, hand hygiene and skin antiseptic. A timeout was performed prior to the initiation of the procedure. The anesthesia team was present to provide general endotracheal tube anesthesia and for patient monitoring during the procedure. Interventional neuro radiology nursing staff was also present. FINDINGS: Initial Findings: Left common carotid artery: ICA patent with no tortuosity and no significant atherosclerosis. Separate branch from the aortic arch. Left external carotid artery:  Patent with antegrade flow. Left internal carotid artery: The common carotid artery is relatively straight with no beading or significant narrowing. The AP image on the initial image demonstrates perhaps a slight irregularity on the lateral intimal surface which may correspond to the coronal reformatted images on the comparison CT preprocedure (image 25 of series 9). Given that there was no flow limiting problem, we elected to proceed with the thrombectomy Left MCA: The vertical horizontal petrous segment patent. Segment lacerum patent. Cavernous and supraclinoid patent. Occlusion of the proximal M1 segment with visible filling defect compatible with the findings on the CT. Left ACA: A1 is patent.  There is a nearly 180 degree angulation of the A1 origin. Patent anterior communicating artery. There has been migration of the thromboembolism through the terminal segment into the A2 segment compared to the prior CT. Procedural findings: Baseline MCA flow: TICI 0 Left MCA: After 1 pass, there is no significant restoration of flow. Upon placement of the stent tree bronch the second attempt, and angiogram demonstrated no significant flow through the solitaire. After the second pass, complete restoration of flow through the MCA territory was achieved, TICI 3. During this time, there was further migration of the A2 embolus to the pericallosal artery, with the ultimate occlusion approximately level to the coronal suture. Given that the anatomy would not allow and navigation of the microcatheter and intermediate catheter into the segment, and given the perceived density of the ACA thromboembolism, we ultimately withdrew from further attempts thrombectomy when the wire and microcatheter would not pass through the face of the thrombus. Final angiogram demonstrated no evidence of extravasation of contrast at this level. The flat panel CT demonstrates inter hemispheric subarachnoid hemorrhage without mass effect. Angiogram of the cervical ICA upon withdrawal of the balloon catheter demonstrated long segment irregularity, with variations in the diameter extending from beyond the bifurcation to the horizontal petrous segment. The irregularity was not only spatial but variations in temporal appearance, as the narrowing worsen to greater than 80%, such as that demonstrated on image 5/7 angiogram 23. After 20 minutes of placing orogastric tube and giving dual antiplatelets dosage, anticipating stenting, a repeat angiogram demonstrated improvement in the appearance of the diameter, image 5/7 of angiogram 24. At this time we elected not to stent. PROCEDURE: The anesthesia team was present to provide general endotracheal tube  anesthesia and for patient monitoring during the procedure. Intubation was performed in negative pressure [place] in neuro IR holding. Interventional neuro radiology nursing staff was also present. Ultrasound survey of the right inguinal region was performed with images stored and sent to PACs. 11 blade scalpel was used to make a small incision. Blunt dissection was performed with US guidance. A micropuncture needle was used access the right common femoral artery under ultrasound. With excellent arterial blood flow returned, an .018 micro wire was passed through the needle, observed to enter the abdominal aorta under fluoroscopy. The needle was removed, and a micropuncture sheath was placed over the wire. The inner dilator and wire were removed, and an 035 wire was advanced under fluoroscopy into the abdominal aorta. The sheath was removed and a 25cm 29F straight vascular sheath was placed. The dilator was removed and the sheath was flushed. Sheath was attached to pressurized and heparinized saline bag for constant forward flow. A coaxial system was then advanced over the 035 wire. This included a 95cm 087 "Walrus" balloon guide with coaxial 125cm Berenstein diagnostic catheter. This was advanced to the proximal descending thoracic aorta. Wire  was then removed. Double flush of the catheter was performed. Catheter was then used to select the left common carotid artery. Angiogram was performed. Using roadmap technique, the catheter was advanced over a standard glide wire into left cervical ICA, with luminal position achieved of the guide catheter. Wire was removed and angiogram was performed of the left carotid artery. Initial angiogram revealed no intimal/luminal irregularity and we proceeded with placement of the balloon guide into the distal cervical ICA. The balloon guide and the parents teen catheter were advanced with a Glidewire into the distal cervical segment of the ICA. Glidewire and the Berenstein catheter were  gently removed and double flush was performed. Formal angiogram was performed. Road map function was used once the occluded vessel was identified. Copious back flush was performed and the balloon catheter was attached to heparinized and pressurized saline bag for forward flow. A second coaxial system was then advanced through the balloon catheter, which included the selected intermediate catheter, microcatheter, and microwire. In this scenario, the set up included intermediate catheter, large-bore aspiration catheter, 135 cm, a Trevo Provue18 microcatheter, and 014 synchro soft wire. This system was advanced through the balloon guide catheter under the road-map function, with adequate back-flush at the rotating hemostatic valve at that back end of the balloon guide. Microcatheter and the intermediate catheter system were advanced through the terminal ICA and MCA to the level of the occlusion. The micro wire was then carefully advanced through the occluded segment. Microcatheter was then manipulated through the occluded segment and the wire was removed with saline drip at the hub. Blood was then aspirated through the hub of the microcatheter, and a gentle contrast injection was performed confirming intraluminal position. A rotating hemostatic valve was then attached to the back end of the microcatheter, and a pressurized and heparinized saline bag was attached to the catheter. 4 x 40 solitaire device was then selected. Back flush was achieved at the rotating hemostatic valve, and then the device was gently advanced through the microcatheter to the distal end. The retriever was then unsheathed by withdrawing the microcatheter under fluoroscopy. Once the retriever was completely unsheathed, the microcatheter was carefully stripped from the delivery device. A 3 minute time interval was observed. The balloon at the balloon guide catheter was then inflated under fluoroscopy for proximal flow arrest. Constant aspiration  using the proprietary engine was then performed at the intermediate catheter, as the retriever was gently and slowly withdrawn with fluoroscopic observation. Once the retriever was "corked" within the tip of the intermediate catheter, both were removed from the system. Free aspiration was confirmed at the hub of the balloon guide catheter, with free blood return confirmed. The balloon was then deflated, and a control angiogram was performed. The M1 segment remained occluded after the first pass. We then attempted a second pass. Microcatheter and the intermediate catheter system were again advanced through the terminal ICA and MCA to the level of the occlusion. The micro wire was then carefully advanced through the occluded segment. Microcatheter was then manipulated through the occluded segment and the wire was removed with saline drip at the hub. Blood was then aspirated through the hub of the microcatheter, and a gentle contrast injection was performed confirming intraluminal position. A rotating hemostatic valve was then attached to the back end of the microcatheter, and a pressurized and heparinized saline bag was attached to the catheter. 4 x 40 solitaire device was again used. Back flush was achieved at the rotating hemostatic valve, and then  the device was gently advanced through the microcatheter to the distal end. The retriever was then unsheathed by withdrawing the microcatheter under fluoroscopy. Once the retriever was completely unsheathed, the microcatheter was carefully stripped from the delivery device. Control angiogram was performed from the intermediate catheter, demonstrating that there was no flow through the open solitaire. A 3 minute time interval was observed. The balloon at the balloon guide catheter was then inflated under fluoroscopy for proximal flow arrest. Aspiration engine was turned on. Intermediate catheter was gently advanced over the proximal solitaire to the level of the occlusion,  with flow arrest. Constant aspiration using the proprietary engine was then performed at the intermediate catheter, as both the retriever and the intermediate catheter were gently and slowly withdrawn with fluoroscopic observation. Once the retriever was "corked" within the tip of the intermediate catheter, both were removed from the system. Free aspiration was confirmed at the hub of the balloon guide catheter, with free blood return confirmed. The balloon was then deflated, and a control angiogram was performed. Restoration of left MCA flow was confirmed. The initial left A2 segment occlusion was observed to have migrated distally to the pericallosal artery. Given the patient's young age we decided to proceed with attempt at mechanical thrombectomy of this ACA embolus. ACA thrombectomy attempt: The walrus balloon catheter remained in the distal cervical segment, proximal to the horizontal petrous segment. We advanced a coaxial wire and catheter system which included 014 synchro soft wire, Zoom 35 intermediate catheter, and tree though pro view 18 microcatheter. This was advanced through the cavernous segment to the supraclinoid segment. This catheter and wire combination failed to engage the angulated A1 origin. We then attempted a coaxial system which included CT 5 intermediate catheter, 150cm phenom 21 microcatheter, and synchro soft wire. This coaxial system was advanced through the cavernous segment, supraclinoid segment, and failed to engage the A1 segment. Various micro wires were then used including Aristotle wire, and double angle headliner wire. The 014 headliner wire was ultimately successful engaging the A1 segment, with a pericallosal position achieved. The intermediate catheter would not make the nearly 180 degree turn into the A1 segment. After several attempts at achieving n/a 1 position with the intermediate catheter, we decided that we could attempt to use an anchoring technique by deploying stent  tree verb across the embolism, and then bringing the intermediate catheter over the microcatheter and stent deployed stent tree verb. This would require, however, passing through the embolus with the microcatheter to deployed the stent tree verge. The microcatheter and microwire combination would not pass gently through the thromboembolism within the pericallosal artery, with quite a dense character observed when the wire and microcatheter were at the face of the thrombus. Ultimately we decided to withdraw from this attempt given the difficulty, and the apparent reactivity of the patient's carotid artery as we observed spasm and irregularity at the skull base on each subsequent control angiogram. The microcatheter was withdrawn to the cavernous segment of the ICA. Wire was removed. Exchange length floppy tip transcend wire was then placed through the microcatheter into a safe position. The balloon guide catheter was then withdrawn into the common carotid artery and angiogram was performed. This revealed irregularity and narrowing in the proximal ICA, which continued with multiple variation in diameter through the cervical ICA to the distal segment at the site of the prior inflated balloon. These findings were discussed with the neurology team, with concern for dissection, as there was irregularity at this segment on  the preprocedural CT scan, indicating possible vasculitis/dissection as not only the current imaging diagnosis, but potentially site of the thromboembolism origin. At this point we performed flat panel CT scan to observe for ICH/S AH, as treatment with a stent would require dual anti-platelet therapy. Flat panel CT demonstrated thin subarachnoid hemorrhage in the inter hemispheric fissure. Interval angiogram of the ICA was performed to determine the need for any possible stenting. 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 This  imaging finding was concerning for progression of a possible intramural hematoma/dissection and at this point we elected to move forward with stenting. At least 20 minutes were required to place an orogastric tube and then deliver our prescribed dose of aspirin and Plavix. Once these events had occurred, repeat angiogram was performed. Interestingly this image revealed significant improvement at the cervical segment, with residual irregularity (image XA #24). At this point we interpreted this as either improving vaso spasm or improving sequela of vasculitis/dissection. Given the flat panel head CT findings and the desire to avoid dual anti-platelet therapy long-term, we elected to withdraw from the case at this point with no stent deployed. Balloon guide was then removed, with all catheters and wires removed. The skin at the puncture site was then cleaned with Chlorhexidine. The 8 French sheath was removed and an 3F angioseal was deployed. Patient tolerated the procedure well and remained hemodynamically stable throughout. Blood loss estimated at 150 cc. IMPRESSION: Status post ultrasound guided access right common femoral artery for cervical/cerebral angiogram and mechanical thrombectomy of left MCA restoring complete flow to the left MCA territory, and failed attempt at revascularization of left pericallosal thromboembolism. Flat panel CT at the completion demonstrates thin subarachnoid hemorrhage in the anterior inter hemispheric fissure. Angio-Seal deployed for hemostasis. Signed, Yvone Neu. Reyne Dumas, RPVI [facility] Radiology PLAN: The patient will remain intubated, as she remains COVID status unknown ICU status Target systolic blood pressure of 140-160 Right hip straight time 6 hours Frequent neurovascular checks Repeat neurologic imaging with CT and/MRI at the discretion of neurology team Electronically Signed   By: Gilmer Mor D.O.   On: 05/21/2020 08:57    CT HEAD CODE STROKE WO CONTRAST  Result Date: 05/19/2020 CLINICAL DATA:  Code stroke.  Stroke suspected. EXAM: CT HEAD WITHOUT CONTRAST TECHNIQUE: Contiguous axial images were obtained from the base of the skull through the vertex without intravenous contrast. COMPARISON:  None. FINDINGS: Brain: No evidence of acute infarction, hemorrhage, hydrocephalus, extra-axial collection or mass lesion/mass effect. Vascular: Dense left ICA terminus and MCA, extensive in compatible with thrombosis in this setting and assuming referable symptoms. CTA is underway. Skull: Negative Sinuses/Orbits: Negative Other: These results were communicated to Dr. Amada Jupiter at 11:25 amon 8/21/2021by text page via the [facility] ([place]) messaging system. ASPECTS [facility] Stroke Program Early CT Score) - Ganglionic level infarction (caudate, lentiform nuclei, internal capsule, insula, M1-M3 cortex): 7 - Supraganglionic infarction (M4-M6 cortex): 3 Total score (0-10 with 10 being normal): 10 IMPRESSION: Dense left ICA terminus and MCA. No acute hemorrhage. ASPECTS is 10. Electronically Signed   By: Marnee Spring M.D.   On: 05/19/2020 11:27   VAS [place] LOWER EXTREMITY VENOUS (DVT)  Result Date: 05/29/2020  Lower Venous DVTStudy Indications: Fever. SARS-CoV-2 positive, and pulmonary embolism.  Comparison Study: 05/21/2020- negative bilateral lower extremity venous duplex Performing Technologist: Gertie Fey MHA, RDMS, RVT, RDCS  Examination Guidelines: A complete evaluation includes B-mode imaging, spectral Doppler, color  Doppler, and power Doppler as needed of all accessible portions of each vessel. Bilateral testing is considered an integral part of a complete examination. Limited examinations for reoccurring indications may be performed as noted. The reflux portion of the exam is performed with the patient in reverse Trendelenburg.  +---------+---------------+---------+-----------+----------+--------------+  RIGHT      Compressibility Phasicity Spontaneity Properties Thrombus Aging  +---------+---------------+---------+-----------+----------+--------------+  CFV       Full            Yes       Yes                                    +---------+---------------+---------+-----------+----------+--------------+  SFJ       Full                                                             +---------+---------------+---------+-----------+----------+--------------+  FV Prox   None                      No                     Acute           +---------+---------------+---------+-----------+----------+--------------+  FV Mid    None                                             Acute           +---------+---------------+---------+-----------+----------+--------------+  FV Distal None                                             Acute           +---------+---------------+---------+-----------+----------+--------------+  PFV       Full                                                             +---------+---------------+---------+-----------+----------+--------------+  POP       None                      No                     Acute           +---------+---------------+---------+-----------+----------+--------------+  PTV       Full                      Yes                                    +---------+---------------+---------+-----------+----------+--------------+  PERO      Partial  Yes                                    +---------+---------------+---------+-----------+----------+--------------+  Gastroc   None                      No                     Acute           +---------+---------------+---------+-----------+----------+--------------+   +---------+---------------+---------+-----------+----------+--------------+  LEFT      Compressibility Phasicity Spontaneity Properties Thrombus Aging  +---------+---------------+---------+-----------+----------+--------------+  CFV       Full            Yes       Yes                                     +---------+---------------+---------+-----------+----------+--------------+  SFJ       Full                                                             +---------+---------------+---------+-----------+----------+--------------+  FV Prox   Full                                                             +---------+---------------+---------+-----------+----------+--------------+  FV Mid    Full                                                             +---------+---------------+---------+-----------+----------+--------------+  FV Distal Full                                                             +---------+---------------+---------+-----------+----------+--------------+  PFV       Full                                                             +---------+---------------+---------+-----------+----------+--------------+  POP       Full            Yes       Yes                                    +---------+---------------+---------+-----------+----------+--------------+  PTV       Full                                                             +---------+---------------+---------+-----------+----------+--------------+  PERO      Full                                                             +---------+---------------+---------+-----------+----------+--------------+     Summary: RIGHT: - Findings consistent with acute deep vein thrombosis involving the right femoral vein, right popliteal vein, and right gastrocnemius veins. - No cystic structure found in the popliteal fossa.  LEFT: - There is no evidence of deep vein thrombosis in the lower extremity.  - No cystic structure found in the popliteal fossa.  *See table(s) above for measurements and observations. Electronically signed by Coral Else MD on 05/29/2020 at 8:52:33 PM.    Final    VAS [place] LOWER EXTREMITY VENOUS (DVT)  Result Date: 05/21/2020  Lower Venous DVTStudy Indications: Stroke.  Limitations: Bandages. Comparison Study: No  prior studies. Performing Technologist: Jean Rosenthal  Examination Guidelines: A complete evaluation includes B-mode imaging, spectral Doppler, color Doppler, and power Doppler as needed of all accessible portions of each vessel. Bilateral testing is considered an integral part of a complete examination. Limited examinations for reoccurring indications may be performed as noted. The reflux portion of the exam is performed with the patient in reverse Trendelenburg.  +---------+---------------+---------+-----------+----------+-------------------+  RIGHT     Compressibility Phasicity Spontaneity Properties Thrombus Aging       +---------+---------------+---------+-----------+----------+-------------------+  CFV                                                        Unable to visualize                                                              due to bandages      +---------+---------------+---------+-----------+----------+-------------------+  SFJ                                                        Unable to visualize                                                              due to bandages      +---------+---------------+---------+-----------+----------+-------------------+  FV Prox   Full                                                                  +---------+---------------+---------+-----------+----------+-------------------+  FV Mid  Full                                                                  +---------+---------------+---------+-----------+----------+-------------------+  FV Distal Full                                                                  +---------+---------------+---------+-----------+----------+-------------------+  PFV       Full                                                                  +---------+---------------+---------+-----------+----------+-------------------+  POP       Full            Yes       Yes                                          +---------+---------------+---------+-----------+----------+-------------------+  PTV       Full                                                                  +---------+---------------+---------+-----------+----------+-------------------+  PERO      Full                                                                  +---------+---------------+---------+-----------+----------+-------------------+   +---------+---------------+---------+-----------+----------+-------------------+  LEFT      Compressibility Phasicity Spontaneity Properties Thrombus Aging       +---------+---------------+---------+-----------+----------+-------------------+  CFV       Full            Yes       Yes                                         +---------+---------------+---------+-----------+----------+-------------------+  SFJ       Full                                                                  +---------+---------------+---------+-----------+----------+-------------------+  FV  Prox   Full                                                                  +---------+---------------+---------+-----------+----------+-------------------+  FV Mid    Full            Yes       Yes                    Some segments not                                                                visualized due to                                                                bandages             +---------+---------------+---------+-----------+----------+-------------------+  FV Distal Full            Yes       Yes                                         +---------+---------------+---------+-----------+----------+-------------------+  PFV       Full                                                                  +---------+---------------+---------+-----------+----------+-------------------+  POP       Full            Yes       Yes                                         +---------+---------------+---------+-----------+----------+-------------------+   PTV       Full                                                                  +---------+---------------+---------+-----------+----------+-------------------+  PERO      Full                                                                  +---------+---------------+---------+-----------+----------+-------------------+  Summary: RIGHT: - There is no evidence of deep vein thrombosis in the lower extremity. However, portions of this examination were limited- see technologist comments above.  - No cystic structure found in the popliteal fossa.  LEFT: - There is no evidence of deep vein thrombosis in the lower extremity. However, portions of this examination were limited- see technologist comments above.  - No cystic structure found in the popliteal fossa.  *See table(s) above for measurements and observations. Electronically signed by Fabienne Bruns MD on 05/21/2020 at 5:11:44 PM.    Final    ECHOCARDIOGRAM LIMITED  Result Date: 05/29/2020    ECHOCARDIOGRAM LIMITED REPORT   Patient Name:   Gail Montgomery Date of Exam: 05/29/2020 Medical Rec #:  [id number]    Height:       64.0 in Accession #:    [id number]   Weight:       187.6 lb Date of Birth:  1996-08-04    BSA:          1.904 m Patient Age:    24 years     BP:           119/77 mmHg Patient Gender: F            HR:           100 bpm. Exam Location:  Inpatient Procedure: Limited Echo Indications:    pulmonary embolism  History:        Patient has prior history of Echocardiogram examinations, most                 recent 05/20/2020. Stroke; Risk Factors:Current Smoker.  Sonographer:    Celene Skeen RDCS (AE) Referring Phys: [phone] A CALDWELL POWELL JR  Sonographer Comments: poor patient compliance (see comments). restricted mobility IMPRESSIONS  1. Left ventricular ejection fraction, by estimation, is 60 to 65%. The left ventricle has normal function. The left ventricle has no regional wall motion abnormalities.  2. Right ventricular systolic function is normal.  The right ventricular size is normal. Mildly D-shaped septum suggestive of RV pressure/volume overload.  3. The aortic valve is tricuspid. Aortic valve regurgitation is not visualized. No aortic stenosis is present.  4. The mitral valve is normal in structure. No evidence of mitral valve regurgitation. No evidence of mitral stenosis.  5. The inferior vena cava is normal in size with greater than 50% respiratory variability, suggesting right atrial pressure of 3 mmHg.  6. Limited echo. FINDINGS  Left Ventricle: Left ventricular ejection fraction, by estimation, is 60 to 65%. The left ventricle has normal function. The left ventricle has no regional wall motion abnormalities. The left ventricular internal cavity size was normal in size. There is  no left ventricular hypertrophy. Right Ventricle: The right ventricular size is normal. No increase in right ventricular wall thickness. Right ventricular systolic function is normal. Left Atrium: Left atrial size was normal in size. Right Atrium: Right atrial size was normal in size. Pericardium: Trivial pericardial effusion is present. Mitral Valve: The mitral valve is normal in structure. No evidence of mitral valve stenosis. Aortic Valve: The aortic valve is tricuspid. Aortic valve regurgitation is not visualized. No aortic stenosis is present. Pulmonic Valve: The pulmonic valve was normal in structure. Pulmonic valve regurgitation is not visualized. Venous: The inferior vena cava is normal in size with greater than 50% respiratory variability, suggesting right atrial pressure of 3 mmHg. IAS/Shunts: No atrial level shunt detected by color flow Doppler. Marca Ancona MD Electronically signed by Marca Ancona  MD Signature Date/Time: 05/29/2020/2:55:34 PM    Final    [place] EKG SITE RITE  Result Date: 05/21/2020 If Site Rite image not attached, placement could not be confirmed due to current cardiac rhythm.

## 2020-06-01 NOTE — Progress Notes (Signed)
STROKE TEAM PROGRESS NOTE   INTERVAL HISTORY Patient remains neurologically unchanged.  She was sleepy earlier but is more arousable and interactive during my rounds.  She remains nonverbal but interacting with facial expressions, and following midline and one-step commands quite well consistently.  No events overnight, she remains on IV heparin with level 0.  38 this am. She is less tachycardic today with heart rate in the 90s.  Discussed plan of care with Dr. Thedore Mins.  Plan to transfer to floor bed today if available.  Hopefully transfer to inpatient rehab next few days once she is off isolation.  Repeat CT scan of the head this morning shows continuing reduction in the left medial frontal hematoma and intraventricular blood without any worsening.  Repeat Covid test is pending.  She continues to have fever intermittently and exact etiology is unclear.  Her fever does respond to Tylenol OBJECTIVE Vitals:   06/01/20 0700 06/01/20 0800 06/01/20 0900 06/01/20 1000  BP: (!) 122/92 (!) 152/85  112/77  Pulse: 86  (!) 102 99  Resp: Temp:      TempSrc:      SpO2: 99% 98% 94% 96%  Weight:      Height:       CBC:  Recent Labs  Lab 05/31/20 0450 06/01/20 0325  WBC 10.5 11.8*  HGB 9.8* 9.8*  HCT 30.5* 29.5*  MCV 87.6 87.8  PLT 412* 512*   Basic Metabolic Panel:  Recent Labs  Lab 05/29/20 0055 05/30/20 0317 05/31/20 0828 06/01/20 0325  NA 130*   < > 129* 131*  K 3.8   < > 3.7 4.3  CL 98   < > 99 97*  CO2 21*   < > 19* 21*  GLUCOSE 104*   < > 102* 97  BUN 9   < > 9 7  CREATININE 0.57   < > 0.51 0.58  CALCIUM 8.4*   < > 8.4* 8.8*  MG 2.1  --  2.0 2.0  PHOS 3.8  --   --   --    < > = values in this interval not displayed.   Lipid Panel:     Component Value Date/Time   CHOL 156 05/20/2020 0958   TRIG 105 05/20/2020 0958   TRIG 104 05/20/2020 0958   HDL 57 05/20/2020 0958   CHOLHDL 2.7 05/20/2020 0958   VLDL 21 05/20/2020 0958   LDLCALC 78 05/20/2020 0958   HgbA1c:   Lab Results  Component Value Date   HGBA1C 5.6 05/20/2020   Urine Drug Screen:     Component Value Date/Time   LABOPIA NONE DETECTED 05/20/2020 1736   COCAINSCRNUR NONE DETECTED 05/20/2020 1736   LABBENZ NONE DETECTED 05/20/2020 1736   AMPHETMU NONE DETECTED 05/20/2020 1736   THCU POSITIVE (A) 05/20/2020 1736   LABBARB NONE DETECTED 05/20/2020 1736    Alcohol Level No results found for: [facility]  IMAGING  CT HEAD CODE STROKE WO CONTRAST 05/19/2020 Dense left ICA terminus and MCA. No acute hemorrhage. ASPECTS is 10.   CT Code Stroke CTA Head W/WO contrast CT Code Stroke CTA Neck W/WO contrast 05/19/2020 1. 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  2. Mildly indistinct appearance of the proximal left ICA is likely from motion artifact, consider cervical run. No atheromatous changes or vasculopathy seen in the neck.   Neuro Interventional Radiology - Cerebral Angiogram with Intervention - Dr Loreta Ave 05/19/20 2:27 PM Left MCA: Baseline  TICI 0 First Pass Device:                 Local aspiration and solitaire 4 x 40              Result                         TICI 0 Second Pass Device:           Local aspiration and solitaire 4 x 40              Result                         TICI 3 Left ACA/pericallosal  Baseline TICI 0,    Final TICI 0 pericallosal artery Findings:  Patent right CFA Left M1 occlusion, final is TICI 3  Left ACA A2 occlusion migrated to pericallosal artery during the case.  Could not complete a first pass safely.  Significant irregularity of the cervical ICA, concerning for dissection, that resolved after observation, and once DAPT was initiated.  The final appearance may represent spasm vs FMD/vasculitis  CT HEAD WO CONTRAST 05/19/2020 New large volume of subarachnoid and intraventricular hemorrhage.  CT HEAD WO CONTRAST 05/20/2020 1. Subarachnoid hemorrhage along the anterior interhemispheric fissure with slightly increased  extension into both lateral ventricles.  2. Slight increase in size of the temporal horn of the left lateral ventricle consistent with early communicating hydrocephalus.   MR BRAIN WO CONTRAST 05/20/2020 1. Early subacute infarct of the left basal ganglia involving the posterior putamen and caudate body.  2. Subarachnoid hemorrhage along the interhemispheric fissure extending into the lateral ventricles.  3. 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   CT HEAD WO CONTRAST 05/21/2020 1. Stable appearance of left basal ganglia nonhemorrhagic infarction. 2. Stable medial left frontal extra-axial hemorrhage, extending into the ventricles bilaterally. 3. Subarachnoid hemorrhage posteriorly on the left and along the inter cerebral hemisphere likely represents redistribution without significant new hemorrhage. 4. Hemorrhage is present within the aqueduct of Sylvius. 5. Progressive dilation of the ventricles compatible with developing hydrocephalus. 6. No new areas of hemorrhage.   CT HEAD WO CONTRAST 05/22/2020 1. Regressed intraventricular hemorrhage since yesterday with small volume residual. Stable mild ventriculomegaly, and possible mild transependymal edema. 2. Stable left inferior frontal gyrus parasagittal hematoma (approximately 12 mL) and regional edema. No significant midline shift, basilar cisterns are patent. 3. Stable left basal ganglia infarcts. 4. No new intracranial abnormality.   CT HEAD WO CONTRAST 05/24/20 IMPRESSION: 1. 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 2. Unchanged intracranial hemorrhage with mild lateral ventriculomegaly.  ECHOCARDIOGRAM COMPLETE 05/20/2020 1. Left ventricular ejection fraction, by estimation, is 60 to 65%. The left ventricle has normal function. The left ventricle has no regional wall motion abnormalities. Left ventricular diastolic parameters were normal.   2. Right ventricular systolic  function is normal. The right ventricular size is normal.   3. The mitral valve is normal in structure. Trivial mitral valve regurgitation. No evidence of mitral stenosis.   4. The aortic valve is normal in structure. Aortic valve regurgitation is not visualized. No aortic stenosis is present.   5. The inferior vena cava is normal in size with greater than 50% respiratory variability, suggesting right atrial pressure of 3 mmHg.   Bilateral Lower Extremity Venous Dopplers  05/21/2020 RIGHT: - There is no evidence of deep vein  thrombosis in the lower extremity. However, portions of this examination were limited- see technologist comments above.  - No cystic structure found in the popliteal fossa.   LEFT: - There is no evidence of deep vein thrombosis in the lower extremity. However, portions of this examination were limited- see technologist comments above.  - No cystic structure found in the popliteal fossa.    Transcranial Doppler w/ Bubble 05/25/2020 Not cooperative for Valsalva.  Bubble study showed Spencer degree 3 at rest.  Recommend TEE for further confirmation.  Consider TEE after Covid isolation and before CIR discharge CT head 05/29/2020 :Interval decrease in the size/conspicuity of intraparenchymal, extra-axial, and intraventricular hemorrhage, as above. No new acute hemorrhage. PHYSICAL EXAM  Temp:  [98.6 F (37 C)-101.8 F (38.8 C)] 98.6 F (37 C) (09/03 0630) Pulse Rate:  [86-146] 99 (09/03 1000) Resp:  [14-22] 18 (09/03 1000) BP: (96-152)/(64-104) 112/77 (09/03 1000) SpO2:  [72 %-100 %] 96 % (09/03 1000)  General -mildly obese young African-American lady Ophthalmologic - fundi not visualized due to noncooperation.   Cardiovascular - regular rhythm and rate  Neurological exam : Awake alert with flat affect, eyes open, nonverbal not answering questions, she will raise eyebrows and shrug.  Able to follow simple commands on both hands and left foot. Able to have bilateral gaze  with voice on both sides, however right gaze incomplete, still has left gaze preference. PERRL. Blinking to visual threat bilaterally. Right facial droop. Tongue protrusion not cooperative. LUE spontaneous movement and LLE strong withdraw to pain. RUE proximal 4/5, distal finger grip 4/5. RLE plegic with only slight withdraw to pain. Sensation, coordination not cooperative and gait not tested.   ASSESSMENT/PLAN Ms. Gail Montgomery is a 24 y.o. female with history of tobacco use, anxiety (panic attacks) and hormonal birth control presented with right-sided weakness and aphasia that started abruptly at 9:30 AM.  COVID - positive. IV t-PA - Saturday 05/19/20 at 1145. Thrombectomy - Dr Loreta Ave - Left M1 occlusion, final is TICI 3.   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 No acute hemorrhage. ASPECTS is 10.      CTA H&N - Emergent large vessel occlusion at the left ICA terminus continuing into the MCA. Mildly indistinct appearance of the proximal left ICA is likely from motion artifact, consider cervical run. No atheromatous changes or vasculopathy seen in the neck.  IR - left MCA and A2 occlusion s/p TICI3 of MCA and TICI0 of ACA. Initially left ICA stenosis post IR but resolved on the final run  CT head 8/21 - New large volume of subarachnoid and intraventricular hemorrhage.   CT head 8/22 [facility] along the anterior interhemispheric fissure with slightly increased extension into both lateral ventricles. Slight increase in size of the temporal horn of the left lateral ventricle consistent with early communicating hydrocephalus.   MRI head 8/22 -  Early subacute infarct of the left basal ganglia involving the posterior  putamen and caudate body.  CT head 8/23 - stable L basal ganglia infarct, ICH/IVH/SAH. Progressive dilation of ventricles. CT head 8/24 - regressed IVH. Stable infarct and frontal ICH. Stable ventriculomegaly CT head 8/26 - stable  infarcts & hemorrhage w/ mild ventriculomegaly  LE Venous Dopplers - no DVT  2D Echo EF 60-65%  TCD bubble study Spencer degree 3 at rest.  Not cooperative for Valsalva.  Sars Corona Virus 2 - positive  LDL - 78  HgbA1c 5.6  Hypercoagulable and autoimmune labs ANCA proteinase 3 ab 6.0 (h) ANA positive - C - ANCA titers < 1:20 (neg)  ;  P - ANCA titers < 1:20 (neg)  UDS - THC positive  VTE prophylaxis - SCDs  No antithrombotic prior to admission, now on No antithrombotic due to [facility] and IVH  Ongoing aggressive stroke risk factor management  Therapy recommendations:  CIR   Disposition:  Pending   Hydrocephalus, mild  CT head showed mildly increased size of left temporal horn   CT repeat stable hemorrhage but mild progression of hydrocephalus  CT head 8/24 - regressed IVH. Stable infarct and frontal ICH. Stable mild ventriculomegaly  NSG on board  3% hypertonic saline - 50 cc/hr ->25cc->10cc -> off  Na - 139->149->154->148->152->150->140->139->138->138->133  PICC placed 8/23   Repeat CT on 8/26 stable  PFO  TCD bubble study showed Spencer degree 3 at rest.  Not cooperative with Valsalva  Consider TEE after Covid isolation and before CIR discharge  If PFO confirmed, consider to refer to Dr. Excell Seltzer cardiology for PFO closure.  BP management  Stable  On Nimodipine for [facility] - discussed with Dr. Conchita Paris, will keep nimodipine while inpt - d/c on discharge if neuro stable  SBP goal < 160 given SAH and IVH (SBP 99 - 125 this AM) . Long-term BP goal normotensive  Hyperlipidemia  Home Lipid lowering medication: none   LDL 78, goal < 70  No statin at this time due to [facility] and IVH  Consider low dose statin at discharge  ?  Hypercoagulable state   COVID positive - asymptomatic  D-dimer > 20->16.13->17.44-> >20-> >20   OCP user with smoking and THC use  Hypercoagulable work up (so far) - ANCS proteinase 3 - 6.0 (H) (0.0 - 3.5)   - C - ANCA titers < 1:20 (neg)  ;  P - ANCA titers < 1:20 (neg)  Myeloperoxidase Abs - < 9.0 (WNL)  COVID - 19 infection  COVID + test 8/21  Asymptomatic  Ferritin 24->30->37->51->93->92  Fibrinogen 289  CRP 0.8->2.4->3.3->3.9->8.7->9.1   D-dimer > 20->16.13->17.44-> >20-> >20 -> >20   COVID Ab titer > 2500  Per ID, ok to be off isolation in 10 days following + test as asymptomatic (05/29/2020)  Leukocytosis, Fever  Etiology unknown-UTI diagnosed on 05/31/2020  TMax 101.2->103.1->100.1->102.3->99.1->103->102->101.4  WBC 10.9->12.2->12.5->11.0->14.0->13.9->17.0  UA small LE, rare bact, 0-5 WBC - neg  CXR neg  Urine culture - 8/25 - no growth  Blood Cx - 8/25 - NG x 3 days - final pending  On vanco and zosyn empiric treatment 8/25>>discontinued 8/27 - now off all abxs Ceftriaxone started 05/31/2020 Tachycardia  HR 110-120s  Likely due to fever  Continue monitoring  Euthyroid sick syndrome  Low TSH 0.054   normal free T4 and T3  Likely related to critical illness  Tobacco abuse  Intermittent smoking per mom  Smoking cessation counseling will be provided  Other Stroke Risk Factors  ETOH use, advised to drink no more than 1 alcoholic beverage per day  Obesity, Body mass index is 32.2 kg/m., recommend weight loss, diet and exercise as appropriate   Hormonal birth control - both progesterone and estrogen - Estarylla  Other Active Problems  Code  status - Full code  Mild anemia 11.2->10.4->11.6->11.4->10.6->10.1->10.3  Hypokalemia 3.3->4.4->3.2 ->3.4 ->3.1->3.5 - supplement - 3.8->3.8  Low normal B12 level 300 - supplement  Thrombocytopenia - 209->137->129->148  PICC line placed 8/23  Hospital day # 13   Patient is neurologically  unchanged and remains significantly aphasic with right lower extremity plegic.  Her tachycardia and fever appear to be improving and with the diagnosis of UTI today and starting ceftriaxone hopefully it will improve further.  Discontinue IV heparin drip   now and will change to Eliquis as it has been nearly 2 weeks since her intracerebral hemorrhage.  Check EEG for seizure activity.  Transfer to floor bed when available and hopefully DC Covid isolation precautions now discussed with Dr. Thedore Mins.  I had a long discussion over the phone with the patient's mother today regarding her difficult and delicate situation and the need to anticoagulate despite recent intracerebral hemorrhage which hopefully was a one-time event caused by the TPA and the mechanical thrombectomy procedure.  I have updated the patient's mother about her condition.  Since her fevers continue she may benefit with checking CT scan of the abdomen and TEE next week to look for endocarditis as well as confirm her PFO.  Discussed with Dr. Thedore Mins and answered questions Greater than 50% time during this 35-minute visit was spent on counseling and coordination of care about her stroke, PFO paradoxical embolism and fever and answering questions Delia Heady     To contact Stroke Continuity provider, please refer to [email or web address]. After hours, contact General Neurology

## 2020-06-01 NOTE — Progress Notes (Signed)
Assisted tele visit to patient with mother and family member.  Gail Amini [name], RN   

## 2020-06-01 NOTE — Progress Notes (Signed)
Assisted tele visit to patient with mother.  Gracelynne Benedict Samson, RN  

## 2020-06-01 NOTE — Procedures (Addendum)
Patient Name: Gail Montgomery  MRN: [id number]  Epilepsy Attending: Charlsie Quest  Referring Physician/Provider: Dr. Susa Raring Date: 06/01/2020 Duration: 23.16 minutes  Patient history: 24 year old female presented with right-sided weakness and aphasia.  MRI showed left MCA infarct.  EEG to evaluate for seizures.  Level of alertness: Awake  AEDs during EEG study: None  Technical aspects: This EEG study was done with scalp electrodes positioned according to the 10-20 International system of electrode placement. Electrical activity was acquired at a sampling rate of 500Hz  and reviewed with a high frequency filter of 70Hz  and a low frequency filter of 1Hz . EEG data were recorded continuously and digitally stored.   Description: No posterior dominant was seen.  EEG showed continuous generalized and lateralized left hemisphere 3 to 5 Hz theta-delta slowing.  2 to 3 Hz rhythmic delta slowing was also noted in left hemisphere, maximal left posterior quadrant.  Hyperventilation and photic stimulation were not performed.     Of note, study was technically difficult due to significant electrode and movement artifact.  ABNORMALITY - Continuous slow, generalized and lateralized left hemisphere - Intermittent rhythmic delta slowing, left hemisphere, maximal left posterior quadrant   IMPRESSION: This technically difficult study is suggestive of cortical dysfunction in left hemisphere, maximal left posterior quadrant.  Lateralized rhythmic delta activity was seen in left hemisphere which is on the ictal-interictal continuum with low potential for seizures.  Additionally, there is evidence of moderate diffuse encephalopathy, nonspecific etiology.  No seizures or epileptiform discharges were seen throughout the recording.  Gail Montgomery 

## 2020-06-02 ENCOUNTER — Inpatient Hospital Stay (HOSPITAL_COMMUNITY): Payer: BC Managed Care – PPO

## 2020-06-02 LAB — COMPREHENSIVE METABOLIC PANEL
ALT: 92 U/L — ABNORMAL HIGH (ref 0–44)
AST: 77 U/L — ABNORMAL HIGH (ref 15–41)
Albumin: 2.1 g/dL — ABNORMAL LOW (ref 3.5–5.0)
Alkaline Phosphatase: 45 U/L (ref 38–126)
Anion gap: 11 (ref 5–15)
BUN: 7 mg/dL (ref 6–20)
CO2: 19 mmol/L — ABNORMAL LOW (ref 22–32)
Calcium: 8.6 mg/dL — ABNORMAL LOW (ref 8.9–10.3)
Chloride: 96 mmol/L — ABNORMAL LOW (ref 98–111)
Creatinine, Ser: 0.54 mg/dL (ref 0.44–1.00)
GFR calc Af Amer: >60 mL/min (ref >60)
GFR calc non Af Amer: >60 mL/min (ref >60)
Glucose, Bld: 119 mg/dL — ABNORMAL HIGH (ref 70–99)
Potassium: 3.5 mmol/L (ref 3.5–5.1)
Sodium: 126 mmol/L — ABNORMAL LOW (ref 135–145)
Total Bilirubin: 0.3 mg/dL (ref 0.3–1.2)
Total Protein: 7.2 g/dL (ref 6.5–8.1)

## 2020-06-02 LAB — PROTIME-INR
INR: 1.3 — ABNORMAL HIGH (ref 0.8–1.2)
Prothrombin Time: 16 seconds — ABNORMAL HIGH (ref 11.4–15.2)

## 2020-06-02 LAB — MAGNESIUM: Magnesium: 1.8 mg/dL (ref 1.7–2.4)

## 2020-06-02 LAB — CBC
HCT: 27.4 % — ABNORMAL LOW (ref 36.0–46.0)
Hemoglobin: 8.9 g/dL — ABNORMAL LOW (ref 12.0–15.0)
MCH: 28.2 pg (ref 26.0–34.0)
MCHC: 32.5 g/dL (ref 30.0–36.0)
MCV: 86.7 fL (ref 80.0–100.0)
Platelets: 485 10*3/uL — ABNORMAL HIGH (ref 150–400)
RBC: 3.16 MIL/uL — ABNORMAL LOW (ref 3.87–5.11)
RDW: 12 % (ref 11.5–15.5)
WBC: 8.3 10*3/uL (ref 4.0–10.5)
nRBC: 0 % (ref 0.0–0.2)

## 2020-06-02 LAB — CULTURE, BLOOD (ROUTINE X 2)
Culture: NO GROWTH
Culture: NO GROWTH

## 2020-06-02 LAB — D-DIMER, QUANTITATIVE: D-Dimer, Quant: 6.02 ug/mL-FEU — ABNORMAL HIGH (ref 0.00–0.50)

## 2020-06-02 LAB — BRAIN NATRIURETIC PEPTIDE: B Natriuretic Peptide: 27.5 pg/mL (ref 0.0–100.0)

## 2020-06-02 LAB — C-REACTIVE PROTEIN: CRP: 13.5 mg/dL — ABNORMAL HIGH (ref <1.0)

## 2020-06-02 LAB — PROCALCITONIN: Procalcitonin: 0.11 ng/mL

## 2020-06-02 MED ORDER — POTASSIUM CHLORIDE 20 MEQ PO PACK
40.0000 meq | PACK | Freq: Once | ORAL | Status: AC
  Administered 2020-06-02: 40 meq via ORAL
  Filled 2020-06-02: qty 2

## 2020-06-02 MED ORDER — IOHEXOL 300 MG/ML  SOLN
100.0000 mL | Freq: Once | INTRAMUSCULAR | Status: AC | PRN
  Administered 2020-06-02: 100 mL via INTRAVENOUS

## 2020-06-02 MED ORDER — IOHEXOL 9 MG/ML PO SOLN
500.0000 mL | ORAL | Status: AC
  Administered 2020-06-02 (×2): 500 mL via ORAL

## 2020-06-02 MED ORDER — ATORVASTATIN CALCIUM 10 MG PO TABS
20.0000 mg | ORAL_TABLET | Freq: Every day | ORAL | Status: DC
  Administered 2020-06-02 – 2020-06-07 (×6): 20 mg via ORAL
  Filled 2020-06-02 (×6): qty 2

## 2020-06-02 NOTE — Progress Notes (Signed)
Orthopedic Tech Progress Note Patient Details:  Gail Montgomery September 02, 1996 [id number] Called in brace Patient ID: Gail Montgomery, female   DOB: 1995-10-19, 24 y.o.   MRN: [id number]   Gail Montgomery 06/02/2020, 1:01 PM

## 2020-06-02 NOTE — Progress Notes (Signed)
Orthopedic Tech Progress Note Patient Details:  [name] 1996-06-03 [id number] Called in brace Patient ID: Gail Montgomery, female   DOB: 1996-01-20, 24 y.o.   MRN: [id number]   [name] 06/02/2020, 6:06 PM

## 2020-06-02 NOTE — PMR Pre-admission (Signed)
PMR Admission Coordinator Pre-Admission Assessment  Patient: Gail Montgomery is an 24 y.o., female MRN: [id number] DOB: 1996/09/16 Height: '5\' 4"'  (162.6 cm) Weight: 85.1 kg              Insurance Information HMO:     PPO: Yes     PCP:       IPA:       80/20:       OTHER:  Group [id number] PRIMARY: [place]      Policy#: [id number]      Subscriber: patient CM Name: Levy Pupa      Phone#: [phone]     Fax#: [phone] Pre-Cert#: [id number] for 2 weeks      Employer: [employer] Benefits:  Phone #: [phone]     Name:   Eff. Date: 09/30/19     Deduct: $1500 (met $852.53)      Out of Pocket Max: $5900 (met met $864.06)      Life Max: N/A  CIR: $337/admission and the 70% coverage, 30% co-insurance      SNF: 70% with 100 days max Outpatient: medical necessity     Co-Pay: $36 to $72 copay per visit depending on provider type Home Health: 70%      Co-Pay: 30% DME: 70%     Co-Pay: 30% Providers: in network  SECONDARY:       Policy#:       Phone#:   Development worker, community:       Phone#:   The Engineer, petroleum" for patients in Inpatient Rehabilitation Facilities with attached "Privacy Act [place] Records" was provided and verbally reviewed with: N/A  Emergency Contact Information Contact Information    Name Relation Home Work Mobile   [place] Mother [phone]  [phone]   Duarte,Kevric Father   [phone]     Current Medical History  Patient Admitting Diagnosis: Stroke   History of Present Illness: Pt. Is a 24 year old female with no significant PMH  Presented to ED 05/19/20 with sudden  right hemiparesis, and aphasia.Code stroke called and pt. 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 She received tPA at 11:29 am ,  was deemed a candidate for thrombectomy, and was taken to IR. In IR she had successful opening of her LMCA with reperfusion, but LACA has a residual small  distal anterior cerebral clot. During procedure there wasICA vasospasm vs dissection. She was loaded with ASA and Plavix. There was concern that she would need carotid stenting . However upon re-imaging the vessel was open, and she did not require stent.SAH, Parenchymal hemorrhage, and intraventricular hemorrhage.  During this admission she has had persistent, intermittent fevers since 8/24. Also has had sinus tachycardia, from 110s to 130s, occasionally 140s 150s. Tachycardia  seems to correlate with the severity of fever mostly, she was breifly given Vanc and Zosyn in ICU on 8/25- 8/27. 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  Patient was transitioned to Eliquis on 06/01/2020. She underwent TEE 06/06/20 showing PFO. She will be follow-up with cardiology as an outpatient for PFO closure.  PT/OT/SLP evaluations were completed with recommendations for an inpatient rehab admission.  She is to be admitted to an inpatient rehab program today.  Complete NIHSS TOTAL: 8 Glasgow Coma Scale Score: 15  Past Medical History  Past Medical  History:  Diagnosis Date  . Anxiety   . Panic attack     Family History  family history is not on file.  Prior Rehab/Hospitalizations:  Has the patient had prior rehab or hospitalizations prior to admission? No  Has the patient had major surgery during 100 days prior to admission? Yes  Current Medications   Current Facility-Administered Medications:  .  acetaminophen (TYLENOL) tablet 500 mg, 500 mg, Oral, Q6H PRN, 500 mg at 06/06/20 2235 **OR** [DISCONTINUED] acetaminophen (TYLENOL) 160 MG/5ML solution 650 mg, 650 mg, Per Tube, Q4H PRN, 650 mg at 05/29/20 2343 **OR** [DISCONTINUED] acetaminophen (TYLENOL) suppository 650 mg, 650 mg, Rectal, Q4H PRN, Elodia Florence., MD, 650 mg at 05/29/20 1238 .  apixaban (ELIQUIS) tablet 5 mg, 5 mg, Oral, BID,  Thurnell Lose, MD, 5 mg at 06/07/20 0900 .  atorvastatin (LIPITOR) tablet 20 mg, 20 mg, Oral, Daily, Rosalin Hawking, MD, 20 mg at 06/07/20 0900 .  Chlorhexidine Gluconate Cloth 2 % PADS 6 each, 6 each, Topical, Daily, Elodia Florence., MD, 6 each at 06/07/20 [phone] .  docusate (COLACE) 50 MG/5ML liquid 100 mg, 100 mg, Per Tube, Daily PRN, Elodia Florence., MD .  folic acid (FOLVITE) tablet 1 mg, 1 mg, Oral, Daily, Elodia Florence., MD, 1 mg at 06/07/20 0900 .  loperamide (IMODIUM) capsule 4 mg, 4 mg, Oral, Q6H PRN, Thurnell Lose, MD, 4 mg at 06/03/20 1605 .  MEDLINE mouth rinse, 15 mL, Mouth Rinse, BID, Elodia Florence., MD, 15 mL at 06/06/20 2041 .  multivitamin (PROSIGHT) tablet 1 tablet, 1 tablet, Oral, Daily, Elodia Florence., MD, 1 tablet at 06/07/20 [phone] .  niMODipine (NYMALIZE) 6 MG/ML oral solution 60 mg, 60 mg, Oral, Q4H, Garvin Fila, MD, 60 mg at 06/07/20 0901 .  pantoprazole (PROTONIX) EC tablet 40 mg, 40 mg, Oral, Daily, Elodia Florence., MD, 40 mg at 06/07/20 0859 .  polyethylene glycol (MIRALAX / GLYCOLAX) packet 17 g, 17 g, Per Tube, Daily PRN, Elodia Florence., MD .  sodium chloride flush (NS) 0.9 % injection 10-40 mL, 10-40 mL, Intracatheter, PRN, Elodia Florence., MD .  thiamine tablet 100 mg, 100 mg, Oral, Daily, Elodia Florence., MD, 100 mg at 06/07/20 0859 .  vitamin B-12 (CYANOCOBALAMIN) tablet 500 mcg, 500 mcg, Oral, Daily, Elodia Florence., MD, 500 mcg at 06/07/20 0900  Patients Current Diet:  Diet Order            Diet - low sodium heart healthy           Diet regular Room service appropriate? Yes; Fluid consistency: Thin  Diet effective now                 Precautions / Restrictions Precautions Precautions: Fall Precaution Comments: right sided weakness (R leg most significantly) Restrictions Weight Bearing Restrictions: No   Has the patient had 2 or more falls or a fall with injury in the  past year?No  Prior Activity Level Community (5-7x/wk): pt. 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  Independent  Indoor Mobility: Did the patient need assistance with walking from room to room (with or without device)? Independent  Stairs: Did the patient need assistance with internal or external stairs (with or without device)? Independent  Functional Cognition: Did the patient need help planning regular tasks such as shopping or remembering to take medications? Independent  Home Assistive Devices / Equipment Home Equipment: None  Prior Device Use: Indicate devices/aids used by the patient prior to current illness, exacerbation or injury? None of the above  Current Functional Level Cognition  Arousal/Alertness: Awake/alert Overall Cognitive Status: Difficult to assess Difficult to assess due to: Impaired communication Current Attention Level: Selective Orientation Level: Oriented X4 Following Commands: Follows one step commands with increased time Safety/Judgement: Decreased awareness of safety General Comments: Pt spoke to PT today, stating "I was worried by nails were digging into you (during gait)", and states "my knee and hip" when PT asks where her pain is. Pt continuing to require short, multimodal cuing for mobility tasks Attention: Sustained Sustained Attention: Impaired Sustained Attention Impairment: Verbal basic    Extremity Assessment (includes Sensation/Coordination)  Upper Extremity Assessment: RUE deficits/detail RUE Deficits / Details: using functionally during ADL however will tend to use LUE at times; will use/attmept to use RUE when cued; decreased coordination and iin-hand manipulation  skills noted RUE Sensation: decreased proprioception RUE Coordination: decreased fine motor, decreased gross motor  Lower Extremity Assessment: Defer to PT evaluation RLE Deficits / Details: right leg with significant weakness.  I did not elicit or observe any active movement on this side.     ADLs  Overall ADL's : Needs assistance/impaired Eating/Feeding: Set up, Sitting Grooming: Minimal assistance, Cueing for sequencing, Sitting, Standing Grooming Details (indicate cue type and reason): partial session completed in standing Upper Body Bathing: Minimal assistance, Sitting Lower Body Bathing: Maximal assistance, Bed level Upper Body Dressing : Maximal assistance Lower Body Dressing: Maximal assistance Lower Body Dressing Details (indicate cue type and reason): Able to reach forward to help donn socks in seated osition Toilet Transfer: Moderate assistance, +2 for physical assistance, Stand-pivot, Ambulation Toilet Transfer Details (indicate cue type and reason): Mod A +2 to power up into standing with Max cues for hand placement when using RW. Pt requiring Max A +2 for pivot. Requiring Max A to block R knee Toileting- Clothing Manipulation and Hygiene: Maximal assistance Toileting - Clothing Manipulation Details (indicate cue type and reason): Pt incontinent of loose stool.  Stood to assist her with peri care - required total A  Functional mobility during ADLs: Maximal assistance, +2 for physical assistance (ambulation) General ADL Comments: Grooming task completed in front of mirror at sink inboth sit/stand opsition. Pt tendingto lean/propr on L arm when brushing teeth using R hand. Ablet o maintain upright midline posture with mod A at times during activity with tactile cues for extension    Mobility  Overal bed mobility: Needs Assistance Bed Mobility: Supine to Sit Rolling: Max assist Sidelying to sit: Max assist, +2 for physical assistance, +2 for safety/equipment Supine to sit: Min  assist, HOB elevated Sit to supine: Mod assist General bed mobility comments: Min assist for supine>sit for RLE management, Mod assist for return to supine for RLE lifting into bed and trunk lowering. Assist for boost up in bed with use of bed pads.    Transfers  Overall transfer level: Needs assistance Equipment used: 2 person hand held assist, 1 person hand held assist Transfers: Sit to/from Stand, Stand Pivot Transfers Sit to Stand: Mod assist, +2 physical assistance Stand pivot transfers: Mod assist General transfer comment: MOd +2 for sit to stand in preparation to ambulate,  with bilateral UEs draped on PT and PT aide shoulders. Assist for RLE blocking, rise, and steady. Mod assist for stand pivot to recliner for RLE blocking, steadying, pivotal step to [facility] towards pt R.    Ambulation / Gait / Stairs / Wheelchair Mobility  Ambulation/Gait Ambulation/Gait assistance: Mod assist, +2 physical assistance Gait Distance (Feet): 10 Feet (2x10 ft) Assistive device: 2 person hand held assist Gait Pattern/deviations: Step-to pattern, Decreased step length - right, Decreased dorsiflexion - right, Trunk flexed, Decreased stance time - right, Narrow base of support General Gait Details: Mod +2 for steadying, blocking RLE during stance phase and facilitating RLE during swing phase of gait, intermittent blocking LLE for buckling during stance phase, and guiding pt to desination surface. verbal cuing for sequencing, responds best to short cues "step right, wait for me, step left" Gait velocity: decr    Posture / Balance Dynamic Sitting Balance Sitting balance - Comments: able to sit EOB without PT support Balance Overall balance assessment: Needs assistance Sitting-balance support: Feet supported, Single extremity supported Sitting balance-Leahy Scale: Fair Sitting balance - Comments: able to sit EOB without PT support Postural control: Posterior lean, Right lateral lean Standing balance support:  During functional activity, Single extremity supported Standing balance-Leahy Scale: Poor Standing balance comment: reliant on external assist    Special needs/care consideration  Designated visitor Mom    Previous Home Environment (from acute therapy documentation) Living Arrangements: Alone (has a boyfriend) Available Help at Discharge: Family, Available 24 hours/day (mom plans to take her to mom's home in [place]) Type of Home: House Home Layout: Multi-level, Able to live on main level with bedroom/bathroom Alternate Level Stairs-Number of Steps: 2 Home Access: Level entry Bathroom Shower/Tub: Multimedia programmer: Standard Additional Comments: plan is for pt to live with Mom after DC  Discharge Living Setting Plans for Discharge Living Setting: House (Parent's home) Type of Home at Discharge: House Discharge Home Layout: Two level, Able to live on main level with bedroom/bathroom Discharge Home Access: Level entry Discharge Bathroom Shower/Tub: Walk-in shower Discharge Bathroom Toilet: Standard Discharge Bathroom Accessibility: Yes How Accessible: Accessible via wheelchair, Accessible via walker Does the patient have any problems obtaining your medications?: No  Social/Family/Support Systems Patient Roles: Parent Contact Information: [phone] Anticipated Caregiver: Tonna Palazzi (mother) confirmed that she and Pt.'s father can provide 24/7 support  Anticipated Caregiver's Contact Information: [phone] Ability/Limitations of Caregiver: none  Caregiver Availability: 24/7 Discharge Plan Discussed with Primary Caregiver: Yes Is Caregiver In Agreement with Plan?: Yes   Goals Patient/Family Goal for Rehab: PT/OT min A, SLP Mod A (12-14 days) Expected length of stay: 14-17 days Pt/Family Agrees to Admission and willing to participate: Yes Program Orientation Provided & Reviewed with Pt/Caregiver Including Roles  & Responsibilities: Yes   Decrease burden of  Care through IP rehab admission: N/A  Possible need for SNF placement upon discharge: Not anticipated   Patient Condition: I have reviewed medical records from [facility], spoken with CM, and family member. I discussed via phone for inpatient rehabilitation assessment.  Patient will benefit from ongoing PT, OT and SLP, can actively participate in 3 hours of therapy a day 5 days of the week, and can make measurable gains during the admission.  Patient will also benefit from the coordinated team approach during an Inpatient Acute Rehabilitation admission.  The patient will receive intensive therapy as well as Rehabilitation physician, nursing, social worker, and care management interventions.  Due to bladder management, bowel management, safety, skin/wound care, disease management,  medication administration, pain management and patient education the patient requires 24 hour a day rehabilitation nursing.  The patient is currently min to mod Assist with mobility and basic ADLs.  Discharge setting and therapy post discharge at home with home health is anticipated.  Patient has agreed to participate in the Acute Inpatient Rehabilitation Program and will admit today.  Preadmission Screen Completed By:  Retta Diones, RN, 06/07/2020 10:04 AM ______________________________________________________________________   Discussed status with Dr. Alveta Heimlich and Dr. Naaman Plummer on 06/07/20 at 0930 and received approval for admission today.  Admission Coordinator:  Retta Diones, time 1003/Date 06/07/20   Assessment/Plan: Diagnosis: 1. Does the need for close, 24 hr/day Medical supervision in concert with the patient's rehab needs make it unreasonable for this patient to be served in a less intensive setting? Yes 2. 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 Due to bladder management, bowel management, safety, skin/wound  care, disease management, medication administration, pain management and patient education, does the patient require 24 hr/day rehab nursing? Yes 4. Does the patient require coordinated care of a physician, rehab nurse, PT, OT, and SLP to address physical and functional deficits in the context of the above medical diagnosis(es)? Yes Addressing deficits in the following areas: balance, endurance, locomotion, strength, transferring, bathing, dressing, feeding, grooming, toileting, cognition, speech and language 5. Can the patient actively participate in an intensive therapy program of at least 3 hrs of therapy 5 days a week? Yes 6. The potential for patient to make measurable gains while on inpatient rehab is excellent 7. Anticipated functional outcomes upon discharge from inpatient rehab: min assist PT, supervision, min assist and mod assist OT, supervision and min assist SLP 8. Estimated rehab length of stay to reach the above functional goals is: 12-14 days 9. Anticipated discharge destination: Home 10. Overall Rehab/Functional Prognosis: excellent   MD Signature:

## 2020-06-02 NOTE — Progress Notes (Signed)
STROKE TEAM PROGRESS NOTE   INTERVAL HISTORY Mom at the bedside.  Patient sitting in chair, awake alert, interactive, able to repeat "I am here", however refused to repeat outer sentences.  Left upper extremity no drift, left lower extremity knee extension 3/5.  T-max 100.4, WBC normalized.  Still has mild tachycardia and hyponatremia.   OBJECTIVE Vitals:   06/02/20 0125 06/02/20 0143 06/02/20 0400 06/02/20 0729  BP:    124/72  Pulse:    (!) 101  Resp:    20  Temp: (!) 100.4 F (38 C) 99.3 F (37.4 C) 98.3 F (36.8 C) 98.5 F (36.9 C)  TempSrc: Rectal Rectal Oral   SpO2:      Weight:      Height:       CBC:  Recent Labs  Lab 06/01/20 0325 06/02/20 0101  WBC 11.8* 8.3  HGB 9.8* 8.9*  HCT 29.5* 27.4*  MCV 87.8 86.7  PLT 512* 485*   Basic Metabolic Panel:  Recent Labs  Lab 05/29/20 0055 05/30/20 0317 06/01/20 0325 06/02/20 0101  NA 130*   < > 131* 126*  K 3.8   < > 4.3 3.5  CL 98   < > 97* 96*  CO2 21*   < > 21* 19*  GLUCOSE 104*   < > 97 119*  BUN 9   < > 7 7  CREATININE 0.57   < > 0.58 0.54  CALCIUM 8.4*   < > 8.8* 8.6*  MG 2.1   < > 2.0 1.8  PHOS 3.8  --   --   --    < > = values in this interval not displayed.   Lipid Panel:     Component Value Date/Time   CHOL 156 05/20/2020 0958   TRIG 105 05/20/2020 0958   TRIG 104 05/20/2020 0958   HDL 57 05/20/2020 0958   CHOLHDL 2.7 05/20/2020 0958   VLDL 21 05/20/2020 0958   LDLCALC 78 05/20/2020 0958   HgbA1c:  Lab Results  Component Value Date   HGBA1C 5.6 05/20/2020   Urine Drug Screen:     Component Value Date/Time   LABOPIA NONE DETECTED 05/20/2020 1736   COCAINSCRNUR NONE DETECTED 05/20/2020 1736   LABBENZ NONE DETECTED 05/20/2020 1736   AMPHETMU NONE DETECTED 05/20/2020 1736   THCU POSITIVE (A) 05/20/2020 1736   LABBARB NONE DETECTED 05/20/2020 1736    Alcohol Level No results found for: [facility]  IMAGING  CT HEAD CODE STROKE WO CONTRAST 05/19/2020 Dense left ICA terminus and MCA. No acute  hemorrhage. ASPECTS is 10.   CT Code Stroke CTA Head W/WO contrast CT Code Stroke CTA Neck W/WO contrast 05/19/2020 1. 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  2. Mildly indistinct appearance of the proximal left ICA is likely from motion artifact, consider cervical run. No atheromatous changes or vasculopathy seen in the neck.   Neuro Interventional Radiology - Cerebral Angiogram with Intervention - Dr Loreta Ave 05/19/20 2:27 PM Left MCA: Baseline TICI 0 First Pass Device:                 Local aspiration and solitaire 4 x 40              Result                         TICI 0 Second Pass Device:           Local  aspiration and solitaire 4 x 40              Result                         TICI 3 Left ACA/pericallosal  Baseline TICI 0,    Final TICI 0 pericallosal artery Findings:  Patent right CFA Left M1 occlusion, final is TICI 3  Left ACA A2 occlusion migrated to pericallosal artery during the case.  Could not complete a first pass safely.  Significant irregularity of the cervical ICA, concerning for dissection, that resolved after observation, and once DAPT was initiated.  The final appearance may represent spasm vs FMD/vasculitis  CT HEAD WO CONTRAST 05/19/2020 New large volume of subarachnoid and intraventricular hemorrhage.  CT HEAD WO CONTRAST 05/20/2020 1. Subarachnoid hemorrhage along the anterior interhemispheric fissure with slightly increased extension into both lateral ventricles.  2. Slight increase in size of the temporal horn of the left lateral ventricle consistent with early communicating hydrocephalus.   MR BRAIN WO CONTRAST 05/20/2020 1. Early subacute infarct of the left basal ganglia involving the posterior putamen and caudate body.  2. Subarachnoid hemorrhage along the interhemispheric fissure extending into the lateral ventricles.  3. Early communicating hydrocephalus as evidenced by slightly increased size of the temporal  horns.   CT HEAD WO CONTRAST 05/21/2020 1. Stable appearance of left basal ganglia nonhemorrhagic infarction. 2. Stable medial left frontal extra-axial hemorrhage, extending into the ventricles bilaterally. 3. Subarachnoid hemorrhage posteriorly on the left and along the inter cerebral hemisphere likely represents redistribution without significant new hemorrhage. 4. Hemorrhage is present within the aqueduct of Sylvius. 5. Progressive dilation of the ventricles compatible with developing hydrocephalus. 6. No new areas of hemorrhage.   CT HEAD WO CONTRAST 05/22/2020 1. Regressed intraventricular hemorrhage since yesterday with small volume residual. Stable mild ventriculomegaly, and possible mild transependymal edema. 2. Stable left inferior frontal gyrus parasagittal hematoma (approximately 12 mL) and regional edema. No significant midline shift, basilar cisterns are patent. 3. Stable left basal ganglia infarcts. 4. No new intracranial abnormality.   CT HEAD WO CONTRAST 05/24/20 IMPRESSION: 1. 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 2. Unchanged intracranial hemorrhage with mild lateral ventriculomegaly.  CT HEAD WO CONTRAST 06/01/20 IMPRESSION: 1. Focal hematoma in the anterior interhemispheric fissure continues to become less dense. 2. Infarction in the left cingulate gyrus and left posterior putamen and caudate nucleus as seen previously. Question some infarction also of the left temporal lobe.  ECHOCARDIOGRAM COMPLETE 05/20/2020 1. Left ventricular ejection fraction, by estimation, is 60 to 65%. The left ventricle has normal function. The left ventricle has no regional wall motion abnormalities. Left ventricular diastolic parameters were normal.   2. Right ventricular systolic function is normal. The right ventricular size is normal.   3. The mitral valve is normal in structure. Trivial mitral valve regurgitation. No evidence of mitral stenosis.   4. The aortic valve is normal in  structure. Aortic valve regurgitation is not visualized. No aortic stenosis is present.   5. The inferior vena cava is normal in size with greater than 50% respiratory variability, suggesting right atrial pressure of 3 mmHg.   Bilateral Lower Extremity Venous Dopplers  05/21/2020 RIGHT: - There is no evidence of deep vein thrombosis in the lower extremity. However, portions of this examination were limited- see technologist comments above.  - No cystic structure found in the popliteal fossa.   LEFT: - There is no evidence of deep  vein thrombosis in the lower extremity. However, portions of this examination were limited- see technologist comments above.  - No cystic structure found in the popliteal fossa.    Transcranial Doppler w/ Bubble 05/25/2020 Not cooperative for Valsalva.  Bubble study showed Spencer degree 3 at rest.  Recommend TEE for further confirmation.  CT head 05/29/2020 :Interval decrease in the size/conspicuity of intraparenchymal, extra-axial, and intraventricular hemorrhage, as above. No new acute hemorrhage.  EEG 06/01/20 ABNORMALITY - Continuous slow, generalized and lateralized left hemisphere - Intermittent rhythmic delta slowing, left hemisphere, maximal left posterior quadrant IMPRESSION: This technically difficult study is suggestive of cortical dysfunction in left hemisphere, maximal left posterior quadrant.  Lateralized rhythmic delta activity was seen in left hemisphere which is on the ictal-interictal continuum with low potential for seizures.  Additionally, there is evidence of moderate diffuse encephalopathy, nonspecific etiology.  No seizures or epileptiform discharges were seen throughout the recording.  PHYSICAL EXAM  Temp:  [98.3 F (36.8 C)-102 F (38.9 C)] 98.5 F (36.9 C) (09/04 0729) Pulse Rate:  [100-110] 101 (09/04 0729) Resp:  [9-20] 20 (09/04 0729) BP: (113-132)/(64-92) 124/72 (09/04 0729) SpO2:  [96 %-100 %] 100 % (09/03 2111)  General -mildly  obese young African-American lady  Ophthalmologic - fundi not visualized due to noncooperation.   Cardiovascular - regular rhythm and mild tachycardia  Neuro - Awake alert with flat affect, eyes open, nonverbal not answering questions, she will raise eyebrows and shrug, able to repeat "I am here", but refused to repeat out her sentences.  Able to follow simple commands on both hands and left foot.  No gaze deviation, blinking to visual threat bilaterally, PERRL. Right facial droop. Tongue protrusion midline. LUE 5/5 and LLE 5/5. RUE 4+/5. RLE proximal 3 -/5, knee extension 3/5, distal PF/DF 1/5. Sensation, coordination not cooperative and gait not tested.   ASSESSMENT/PLAN Ms. Gail Montgomery is a 24 y.o. female with history of tobacco use, anxiety (panic attacks) and hormonal birth control presented with right-sided weakness and aphasia that started abruptly at 9:30 AM.  COVID - positive. IV t-PA - Saturday 05/19/20 at 1145. Thrombectomy - Dr Loreta Ave - Left M1 occlusion, final is TICI 3.   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 Paradoxical emboli in the setting of PFO 3. COVID hypercoagulability in the setting of OCP use and intermittent smoking - however, COVID Ab high titer    CT Head - Dense left ICA terminus and MCA. No acute hemorrhage. ASPECTS is 10.      CTA H&N - Emergent large vessel occlusion at the left ICA terminus continuing into the MCA. Mildly indistinct appearance of the proximal left ICA is likely from motion artifact, consider cervical run. No atheromatous changes or vasculopathy seen in the neck.  IR - left MCA and A2 occlusion s/p TICI3 of MCA and TICI0 of ACA. Initially left ICA stenosis post IR but resolved on the final run  CT head 8/21 - New large volume of subarachnoid  and intraventricular hemorrhage.   CT head 8/22 [facility] along the anterior interhemispheric fissure with slightly increased extension into both lateral ventricles. Slight increase in size of the temporal horn of the left lateral ventricle consistent with early communicating hydrocephalus.   MRI head 8/22 -  Early subacute  infarct of the left basal ganglia involving the posterior putamen and caudate body.   CT head 8/23 - stable L basal ganglia infarct, ICH/IVH/SAH. Progressive dilation of ventricles.  CT head 8/24 - regressed IVH. Stable infarct and frontal ICH. Stable ventriculomegaly  CT head 8/26 - stable infarcts & hemorrhage w/ mild ventriculomegaly   CT - 06/01/20 - frontal hematoma less dense. Stable left MCA infarct  EEG - 06/01/20 Lateralized rhythmic delta activity was seen in left hemisphere which is on the ictal-interictal continuum with low potential for seizures.  2D Echo EF 60-65%  TCD bubble study Spencer degree 3 at rest.  Not cooperative for Valsalva.  Sars Corona Virus 2 - positive  LDL - 78  HgbA1c 5.6  UDS - THC positive  VTE prophylaxis - SCDs  No antithrombotic prior to admission, now on [facility] -> Eliquis started 9/3   Ongoing aggressive stroke risk factor management  Therapy recommendations:  CIR   Disposition:  Pending   Hydrocephalus, mild  CT head showed mildly increased size of left temporal horn   CT repeat stable hemorrhage but mild progression of hydrocephalus  CT head 8/24 - regressed IVH. Stable infarct and frontal ICH. Stable mild ventriculomegaly  NSG on board  3% hypertonic saline - 50 cc/hr ->25cc->10cc -> off  PICC placed 8/23   Repeat CT on 8/26 and 9/3 stable  DVT and PE  8/23 LE venous Doppler negative for DVT  8/30 LE venous Doppler showed acute right LE DVT   8/30 CTA chest bilateral segmental lower lobe PE, left greater than right  Was on heparin IV, now transitioned to Eliquis  9/3  PFO  TCD bubble study showed Spencer degree 3 at rest.  Not cooperative with Valsalva  Plan for TEE next week  If PFO confirmed, consider to refer to Dr. Excell Seltzerooper cardiology for PFO closure.  BP management  Stable  [facility] discussed with Dr. Conchita ParisNundkumar, will keep nimodipine while inpt - d/c on discharge if neuro stable  SBP goal < 160 . Long-term BP goal normotensive  Hyperlipidemia  Home Lipid lowering medication: none   LDL 78, goal < 70  On Lipitor 20  Continue statin at discharge  ? Hypercoagulable state   COVID positive - asymptomatic  D-dimer > 20->16.13->17.44-> >20-> >20->12.89->6.37->5.4->6.02  OCP user with smoking and THC use  Hypercoagulable work up positive ANCA proteinase 3 - 6.0 (H) (0.0 - 3.5) and positive ANA  Repeat above as outpatient  COVID - 19 infection  COVID + test 8/21  Asymptomatic  Ferritin 24->30->37->51->93->92->12 5  Fibrinogen 289  CRP 0.8->2.4->3.3->3.9->8.7->9.1->28.8->17.3->13.5  D-dimer > 20->16.13->17.44-> >20-> >20 ->12.89->6.37->5.4->6.02  COVID Ab titer > 2500  off isolation now per ID  Leukocytosis, Fever, tachycardia  Etiology unclear  TMax 101.2->103.1->100.1->102.3->99.1->103->102->101.4->100.4  WBC 10.9->12.2->12.5->11.0->14.0->13.9->17.0...11.8->8.3  HR 100-120s  UA small LE, rare bact, 0-5 WBC  CXR neg  Urine culture - 8/30 E. Coli   Blood Cx - 8/25  And 8/30 all neg  vanco and zosyn empiric treatment 8/25>>8/27  On Rocephin 9/2 >>  Euthyroid sick syndrome  Low TSH 0.054   normal free T4 and T3  Likely related to critical illness  Tobacco abuse  Intermittent smoking per mom  Smoking cessation counseling will be provided  Other Stroke Risk Factors  ETOH use, advised to drink no more than 1 alcoholic beverage per day  Obesity, Body mass index is 32.2 kg/m., recommend weight loss, diet and exercise as  appropriate   Hormonal birth control - both  progesterone and estrogen - Estarylla  Other Active Problems  Code status - Full code  Low normal B12 level 300 - supplement  Hospital day # 14   Marvel Plan, MD PhD Stroke Neurology 06/02/2020 5:53 PM   To contact Stroke Continuity provider, please refer to [email or web address]. After hours, contact General Neurology

## 2020-06-02 NOTE — Progress Notes (Signed)
Temp-100.7, Tylenol 500 mg given as ordered. Physician on call notified.

## 2020-06-02 NOTE — Progress Notes (Signed)
PROGRESS NOTE                                                                                                                                                                                                             Patient Demographics:    Gail Montgomery, is a 24 y.o. female, DOB - 1996/04/07, ZOX:[id number]  Outpatient Primary MD for the patient is No primary care provider on file.    LOS - 14  Admit date - 05/19/2020         Brief Narrative - 24 year old female normal upon waking up this morning 8/21 2021. At 09:30 she had sudden onset of right hemiparesis, and aphasia. She presents 8/21/2021to the [facility] ED as a Code Stroke  with acute left MCA syndrome.She was screened as Covid +, but was asymptomatic. She received tPA at 11:29 am , she was deemed a  candidate for thrombectomy, and was taken to IR, in IR she had successful  opening of her LMCA with reperfusion, but LACA has a residual small distal anterior cerebral clot. During procedure there was  ICA vasospasm vs dissection. She was loaded with ASA and Plavix. There was concern that she would need carotid  stenting . However upon re-imaging the vessel was open, and she did not require stent. SAH, Parenchymal hemorrhage, and intraventricular hemorrhage.   She has had persistent, intermittent fevers since 8/24. Also has had sinus tachycardia, from 110s to 130s, occasionally 140s 150s.  Tachy seems to correlate with the severity of fever mostly, she was breifly given Vanc and Zosyn in ICU on 8/25- 8/27.  Abx were stopped, she was then found to have DVT-PE and started on Hep gtt, transferred to my service on 05/31/20 on 12 of her Hospital stay. 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    Subjective:   Patient in bed appears to be in no distress, still non verbal.   Assessment  & Plan :     1. Incidental Covid +ve status with no Pulmonary  symptoms - fully vaccinated with Pfizer, monitor.repeat Covid test on 05/31/2020.  Trying to remove isolation as soon as possible.   Recent Labs  Lab  0000 05/28/20 8119 05/28/20 1478 05/28/20 2956 05/28/20 1225 05/29/20 0055 05/30/20 2130 05/31/20 8657 05/31/20 8469 06/01/20 0325 06/02/20 0101  WBC   < >  --   --   --   --  15.0* 13.7* 10.5  --  11.8* 8.3  PLT   < >  --   --   --   --  242 187 412*  --  512* 485*  CRP  --   --   --  28.8*  --   --   --   --  17.3* 17.7* 13.5*  BNP  --   --   --   --   --   --   --   --  33.8 21.4 27.5  DDIMER  --   --   --  12.89*  --   --   --   --  6.37* 5.40* 6.02*  PROCALCITON  --   --   --   --    < > 0.32 0.41  --  0.19 0.18 0.11  AST  --   --  71*  --   --  66* 82*  --   --  83* 77*  ALT  --   --  56*  --   --  57* 72*  --   --  90* 92*  ALKPHOS  --   --  44  --   --  43 53  --   --  46 45  BILITOT  --   --  0.3  --   --  0.2* 0.7  --   --  0.4 0.3  ALBUMIN  --   --  2.4*  --   --  2.0* 2.3*  --   --  2.2* 2.1*  INR  --   --   --   --   --  1.1 1.1 1.1  --  1.2 1.3*  LATICACIDVEN  --  0.8  --   --   --   --   --   --   --   --   --    < > = values in this interval not displayed.    2. 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 She has had full stroke work-up and neurology is following, transcranial Doppler with bubble study suggestive of possible PFO.  She also had work-up consistent with PE and DVT, she likely has paradoxical embolic infarction in the setting of DVT PE with PFO. Repeat CT on 06/01/20 shows improvement in ICH. Currently on nimodipine with goal SBP of under 160, LDL greater than 78 but statin held due to subarachnoid hemorrhage by neurology, statin per Neuro.   3.  PFO.  Once she is out of her Covid quarantine will consult  cardiology for TEE and possible PFO closure.  4.  Right lower extremity acute DVT and bilateral acute PE.  Due to combination of hormonal contraception, obesity, some exposure to cigarette smoking, long flight to [facility] prior to her sickness.  Anticoagulation with heparin thereafter at least 9 months of anticoagulation with outpatient hematology follow-up for hypercoagulable state work-up if needed.  5.  Persistent low-grade fevers.  Could be due to blood clots and also now evidence of E. coli UTI on 05/31/2020.  3 days of Rocephin, anticoagulation will also check CT scan abdomen pelvis with oral and IV contrast on 06/02/2020  to rule out any infectious source and monitor  6.  Intermittent smoking will counsel prior to discharge.  7.  Obesity BMI of 32 upon admission.  Follow-up with PCP.  8.  Sick euthyroid syndrome.  Repeat TSH in 6  weeks.   9.  Hyponatremia.  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  Stop further IV fluids, gentle Lasix on 05/31/2020 improved, repeat a low dose on 06/01/20.     Condition - Extremely Guarded  Family Communication  :  Mother Darlina Sicilian [phone] - 05/31/20 in detail, 06/01/20, 06/02/20  Code Status :  Full  Consults  :  PCCM, neurology  Procedures  :    EEG  CTA - 1. Bilateral segmental LOWER lobe pulmonary emboli, LEFT-greater-than-RIGHT. Equivocal segmental emboli within the LEFT UPPER lobe. 2. Mild cardiomegaly.   CT HEAD CODE STROKE WO CONTRAST 05/19/2020 Dense left ICA terminus and MCA. No acute hemorrhage. ASPECTS is 10.   CT Code Stroke CTA Head W/WO contrast CT Code Stroke CTA Neck W/WO contrast 05/19/2020 1. 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  2. Mildly indistinct appearance of the proximal left ICA is likely from motion artifact, consider cervical run. No atheromatous changes or vasculopathy seen in the neck.   Neuro Interventional Radiology - Cerebral Angiogram with Intervention - Dr Loreta Ave 05/19/20 2:27  PM Left MCA: Baseline TICI 0 First Pass Device:Local aspiration and solitaire 4 x 40  ResultTICI 0 SecondPass Device:Local aspiration and solitaire 4 x 40  ResultTICI 3 Left ACA/pericallosal  Baseline TICI 0,    Final TICI 0 pericallosal artery Findings:Patent right CFA Left M1 occlusion, final is TICI 3  Left ACA A2 occlusion migrated to pericallosal artery during the case. Could not complete a first pass safely.  Significant irregularity of the cervical ICA, concerning for dissection, that resolved after observation, and once DAPT was initiated. The final appearance may represent spasm vs FMD/vasculitis  CT HEAD WO CONTRAST 05/19/2020 New large volume of subarachnoid and intraventricular hemorrhage.  CT HEAD WO CONTRAST 05/20/2020 1. Subarachnoid hemorrhage along the anterior interhemispheric fissure with slightly increased extension into both lateral ventricles.  2. Slight increase in size of the temporal horn of the left lateral ventricle consistent with early communicating hydrocephalus.   MR BRAIN WO CONTRAST 05/20/2020 1. Early subacute infarct of the left basal ganglia involving the posterior putamen and caudate body.  2. Subarachnoid hemorrhage along the interhemispheric fissure extending into the lateral ventricles.  3. 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   CT HEAD WO CONTRAST 05/21/2020 1. Stable appearance of left basal ganglia nonhemorrhagic infarction. 2. Stable medial left frontal extra-axial hemorrhage, extending into the ventricles bilaterally. 3. Subarachnoid hemorrhage posteriorly on the left and along the inter cerebral hemisphere likely represents redistribution without significant new hemorrhage. 4. Hemorrhage is present within the aqueduct of Sylvius. 5. Progressive dilation of the ventricles  compatible with developing hydrocephalus. 6. No new areas of hemorrhage.   CT HEAD WO CONTRAST 05/22/2020 1. Regressed intraventricular hemorrhage since yesterday with small volume residual. Stable mild ventriculomegaly, and possible mild transependymal edema. 2. Stable left inferior frontal gyrus parasagittal hematoma (approximately 12 mL) and regional edema. No significant midline shift, basilar cisterns are patent. 3. Stable left basal ganglia infarcts. 4. No new intracranial abnormality.   CT HEAD WO CONTRAST 05/24/20 IMPRESSION: 1. 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 2. Unchanged intracranial hemorrhage with mild lateral ventriculomegaly.  ECHOCARDIOGRAM COMPLETE 05/20/2020  1. Left ventricular ejection fraction, by estimation, is 60 to 65%. The left ventricle has normal function. The left ventricle has no regional wall motion abnormalities. Left ventricular diastolic parameters were normal.   2. Right ventricular systolic function is normal. The right ventricular size is normal.   3. The mitral valve is normal in structure. Trivial mitral valve regurgitation. No evidence of mitral stenosis.   4. The aortic valve is normal in structure. Aortic valve regurgitation is not visualized. No aortic stenosis is present.   5. The inferior vena cava is normal in size with greater than 50% respiratory variability, suggesting right atrial pressure of 3 mmHg.   Bilateral Lower Extremity Venous Dopplers  05/21/2020 RIGHT: - There is no evidence of deep vein thrombosis in the lower extremity. However, portions of this examination were limited- see technologist comments above.  - No cystic structure found in the popliteal fossa.   LEFT: - There is no evidence of deep vein thrombosis in the lower extremity. However, portions of this examination were limited- see technologist comments above.  - No cystic structure found in the popliteal fossa.    Transcranial Doppler w/  Bubble 05/25/2020 Not cooperative for Valsalva.  Bubble study showed Spencer degree 3 at rest.  Recommend TEE for further confirmation.    PUD Prophylaxis : PPI  Disposition Plan  :    Status is: Inpatient  Remains inpatient appropriate because:IV treatments appropriate due to intensity of illness or inability to take PO   Dispo: The patient is from: Home              Anticipated d/c is to: CIR              Anticipated d/c date is: > 3 days              Patient currently is not medically stable to d/c.   DVT Prophylaxis  :    Heparin    Lab Results  Component Value Date   PLT 485 (H) 06/02/2020    Diet :  Diet Order            DIET DYS 2 Room service appropriate? Yes with Assist; Fluid consistency: Thin  Diet effective now                  Inpatient Medications  Scheduled Meds:  apixaban  5 mg Oral BID   Chlorhexidine Gluconate Cloth  6 each Topical Daily   folic acid  1 mg Oral Daily   iohexol  500 mL Oral Q1H   mouth rinse  15 mL Mouth Rinse BID   multivitamin  1 tablet Oral Daily   niMODipine  60 mg Oral Q4H   pantoprazole  40 mg Oral Daily   thiamine  100 mg Oral Daily   vitamin B-12  500 mcg Oral Daily   Continuous Infusions:  cefTRIAXone (ROCEPHIN)  IV Stopped (06/01/20 1257)   PRN Meds:.acetaminophen **OR** [DISCONTINUED] acetaminophen (TYLENOL) oral liquid 160 mg/5 mL **OR** [DISCONTINUED] acetaminophen, docusate, polyethylene glycol, sodium chloride flush  Antibiotics  :    Anti-infectives (From admission, onward)   Start     Dose/Rate Route Frequency Ordered Stop   05/31/20 1215  cefTRIAXone (ROCEPHIN) 1 g in sodium chloride 0.9 % 100 mL IVPB        1 g 200 mL/hr over 30 Minutes Intravenous Every 24 hours 05/31/20 1205 06/03/20 1214   05/29/20 0000  vancomycin (VANCOCIN) IVPB 1000 mg/200 mL premix  Status:  Discontinued        1,000 mg 200 mL/hr over 60 Minutes Intravenous Every 8 hours 05/28/20 1543 05/28/20 1557   05/28/20 1545   ceFEPIme (MAXIPIME) 2 g in sodium chloride 0.9 % 100 mL IVPB  Status:  Discontinued        2 g 200 mL/hr over 30 Minutes Intravenous Every 8 hours 05/28/20 1541 05/28/20 1557   05/28/20 1545  vancomycin (VANCOREADY) IVPB 1500 mg/300 mL  Status:  Discontinued        1,500 mg 150 mL/hr over 120 Minutes Intravenous  Once 05/28/20 1541 05/28/20 1606   05/23/20 2200  piperacillin-tazobactam (ZOSYN) IVPB 3.375 g  Status:  Discontinued        3.375 g 12.5 mL/hr over 240 Minutes Intravenous Every 8 hours 05/23/20 2055 05/25/20 1619   05/23/20 2100  vancomycin (VANCOCIN) IVPB 1000 mg/200 mL premix  Status:  Discontinued        1,000 mg 200 mL/hr over 60 Minutes Intravenous Every 8 hours 05/23/20 2055 05/25/20 1608       Time Spent in minutes  30   Susa Montgomery M.D on 06/02/2020 at 12:03 PM  To page go to [email or web address] - password [facility]  [facility] -  Office  [phone]    See all Orders from today for further details    Objective:   Vitals:   06/02/20 0125 06/02/20 0143 06/02/20 0400 06/02/20 0729  BP:    124/72  Pulse:    (!) 101  Resp:    20  Temp: (!) 100.4 F (38 C) 99.3 F (37.4 C) 98.3 F (36.8 C) 98.5 F (36.9 C)  TempSrc: Rectal Rectal Oral   SpO2:      Weight:      Height:        Wt Readings from Last 3 Encounters:  05/29/20 85.1 kg  11/07/16 70.3 kg (86 %, Z= 1.08)*   * Growth percentiles are based on CDC (Girls, 2-20 Years) data.     Intake/Output Summary (Last 24 hours) at 06/02/2020 1203 Last data filed at 06/01/2020 1700 Gross per 24 hour  Intake 412.94 ml  Output 200 ml  Net 212.94 ml     Physical Exam  Awake Alert, Non verbal, R leg >> weaker than R Arm,  [place].AT,PERRAL Supple Neck,No JVD, No cervical lymphadenopathy appriciated.  Symmetrical Chest wall movement, Good air movement bilaterally, CTAB RRR,No Gallops, Rubs or new Murmurs, No Parasternal Heave +ve B.Sounds, Abd Soft, No tenderness, No organomegaly appriciated, No rebound -  guarding or rigidity. No Cyanosis, Clubbing or edema, No new Rash or bruise    Data Review:    CBC Recent Labs  Lab 05/29/20 0055 05/30/20 0317 05/31/20 0450 06/01/20 0325 06/02/20 0101  WBC 15.0* 13.7* 10.5 11.8* 8.3  HGB 9.4* 10.4* 9.8* 9.8* 8.9*  HCT 28.4* 32.7* 30.5* 29.5* 27.4*  PLT 242 187 412* 512* 485*  MCV 86.6 89.1 87.6 87.8 86.7  MCH 28.7 28.3 28.2 29.2 28.2  MCHC 33.1 31.8 32.1 33.2 32.5  RDW 11.9 12.2 12.1 12.1 12.0    Recent Labs  Lab 05/28/20 0151 05/28/20 0151 05/28/20 0438 05/28/20 0859 05/28/20 0947 05/28/20 1225 05/29/20 0055 05/30/20 0317 05/31/20 0450 05/31/20 0828 06/01/20 0325 06/02/20 0101  NA 129*   < >  --  131*  --    < > 130* 130*  --  129* 131* 126*  K 4.1   < >  --  3.9  --    < >  3.8 4.1  --  3.7 4.3 3.5  CL 99   < >  --  99  --    < > 98 98  --  99 97* 96*  CO2 18*   < >  --  19*  --    < > 21* 16*  --  19* 21* 19*  GLUCOSE 115*   < >  --  115*  --    < > 104* 94  --  102* 97 119*  BUN 9   < >  --  8  --    < > 9 13  --  9 7 7   CREATININE 0.56   < >  --  0.60  --    < > 0.57 0.57  --  0.51 0.58 0.54  CALCIUM 8.2*   < >  --  8.5*  --    < > 8.4* 8.5*  --  8.4* 8.8* 8.6*  AST  --   --   --  71*  --   --  66* 82*  --   --  83* 77*  ALT  --   --   --  56*  --   --  57* 72*  --   --  90* 92*  ALKPHOS  --   --   --  44  --   --  43 53  --   --  46 45  BILITOT  --   --   --  0.3  --   --  0.2* 0.7  --   --  0.4 0.3  ALBUMIN  --   --   --  2.4*  --   --  2.0* 2.3*  --   --  2.2* 2.1*  MG 2.0  --   --   --   --   --  2.1  --   --  2.0 2.0 1.8  CRP  --   --   --   --  28.8*  --   --   --   --  17.3* 17.7* 13.5*  DDIMER  --   --   --   --  12.89*  --   --   --   --  6.37* 5.40* 6.02*  PROCALCITON  --   --   --   --   --    < > 0.32 0.41  --  0.19 0.18 0.11  LATICACIDVEN  --   --  0.8  --   --   --   --   --   --   --   --   --   INR  --   --   --   --   --   --  1.1 1.1 1.1  --  1.2 1.3*  BNP  --   --   --   --   --   --   --   --   --   33.8 21.4 27.5   < > = values in this interval not displayed.    ------------------------------------------------------------------------------------------------------------------ No results for input(s): CHOL, HDL, LDLCALC, TRIG, CHOLHDL, LDLDIRECT in the last 72 hours.  Lab Results  Component Value Date   HGBA1C 5.6 05/20/2020   ------------------------------------------------------------------------------------------------------------------ No results for input(s): TSH, T4TOTAL, T3FREE, THYROIDAB in the last 72 hours.  Invalid input(s): FREET3  Cardiac Enzymes No results for input(s): CKMB, TROPONINI, MYOGLOBIN in the last 168 hours.  Invalid input(s): CK ------------------------------------------------------------------------------------------------------------------  Component Value Date/Time   BNP 27.5 06/02/2020 0101    Micro Results Recent Results (from the past 240 hour(s))  Culture, blood (routine x 2)     Status: None   Collection Time: 05/23/20  3:44 PM   Specimen: BLOOD  Result Value Ref Range Status   Specimen Description BLOOD SITE NOT SPECIFIED  Final   Special Requests   Final    BOTTLES DRAWN AEROBIC ONLY Blood Culture adequate volume   Culture   Final    NO GROWTH 5 DAYS Performed at [facility], [address] [place], [place] [address]    Report Status 05/28/2020 FINAL  Final  Culture, blood (routine x 2)     Status: None   Collection Time: 05/23/20  7:57 PM   Specimen: BLOOD  Result Value Ref Range Status   Specimen Description BLOOD RIGHT ANTECUBITAL  Final   Special Requests   Final    BOTTLES DRAWN AEROBIC ONLY Blood Culture adequate volume   Culture   Final    NO GROWTH 5 DAYS Performed at [facility], [address], [place], [place] [address]    Report Status 05/28/2020 FINAL  Final  Culture, Urine     Status: None   Collection Time: 05/23/20  9:03 PM   Specimen: Urine, Random  Result Value Ref Range Status    Specimen Description URINE, RANDOM  Final   Special Requests NONE  Final   Culture   Final    NO GROWTH Performed at [facility], [address], [place], [place] [address]    Report Status 05/24/2020 FINAL  Final  Culture, blood (routine x 2)     Status: None (Preliminary result)   Collection Time: 05/28/20  4:39 AM   Specimen: BLOOD RIGHT HAND  Result Value Ref Range Status   Specimen Description BLOOD RIGHT HAND  Final   Special Requests   Final    BOTTLES DRAWN AEROBIC ONLY Blood Culture results may not be optimal due to an inadequate volume of blood received in culture bottles   Culture   Final    NO GROWTH 4 DAYS Performed at [facility], [address], [place], [place] [address]    Report Status PENDING  Incomplete  Culture, blood (routine x 2)     Status: None (Preliminary result)   Collection Time: 05/28/20  4:39 AM   Specimen: BLOOD  Result Value Ref Range Status   Specimen Description BLOOD RIGHT ARM  Final   Special Requests   Final    BOTTLES DRAWN AEROBIC ONLY Blood Culture results may not be optimal due to an inadequate volume of blood received in culture bottles   Culture   Final    NO GROWTH 4 DAYS Performed at [facility], [address] [place], [place] [address]    Report Status PENDING  Incomplete  Culture, Urine     Status: Abnormal   Collection Time: 05/28/20  1:51 PM   Specimen: Urine, Random  Result Value Ref Range Status   Specimen Description URINE, RANDOM  Final   Special Requests   Final    NONE Performed at [facility], [address] [place], [place] [address]    Culture >=100,000 COLONIES/mL ESCHERICHIA COLI (A)  Final   Report Status 05/30/2020 FINAL  Final   Organism ID, Bacteria ESCHERICHIA COLI (A)  Final      Susceptibility   Escherichia coli - MIC*  AMPICILLIN >=32 RESISTANT Resistant     CEFAZOLIN 16 SENSITIVE Sensitive     CEFTRIAXONE <=0.25 SENSITIVE Sensitive     CIPROFLOXACIN <=0.25 SENSITIVE  Sensitive     GENTAMICIN >=16 RESISTANT Resistant     IMIPENEM <=0.25 SENSITIVE Sensitive     NITROFURANTOIN <=16 SENSITIVE Sensitive     TRIMETH/SULFA <=20 SENSITIVE Sensitive     AMPICILLIN/SULBACTAM >=32 RESISTANT Resistant     PIP/TAZO <=4 SENSITIVE Sensitive     * >=100,000 COLONIES/mL ESCHERICHIA COLI    Radiology Reports CT Code Stroke CTA Head W/WO contrast  Result Date: 05/19/2020 CLINICAL DATA:  Stroke suspected. EXAM: CT ANGIOGRAPHY HEAD AND NECK TECHNIQUE: Multidetector CT imaging of the head and neck was performed using the standard protocol during bolus administration of intravenous contrast. Multiplanar CT image reconstructions and MIPs were obtained to evaluate the vascular anatomy. Carotid stenosis measurements (when applicable) are obtained utilizing NASCET criteria, using the distal internal carotid diameter as the denominator. CONTRAST:  Dose is currently not known COMPARISON:  Head CT reported separately FINDINGS: CTA NECK FINDINGS Aortic arch: Negative.  Three vessel branching Right carotid system: No atheromatous changes, dissection, or beading. Left carotid system: Mild motion artifact which could account for the mildly smudged appearance of the left ICA proximally. Vertebral arteries: No proximal subclavian stenosis. Codominant vertebral arteries that are smooth and widely patent to the dura. Skeleton: No acute or aggressive finding Other neck: Negative Upper chest: Negative Review of the MIP images confirms the above findings CTA HEAD FINDINGS Anterior circulation: Cut off at the left ICA terminus with visible luminal clot. There is continuation of non opacification throughout the left M1 segment which is clot by CT. Non opacification at the left A1 origin. No contralateral branch occlusion is seen. Intravascular enhancement seen in the left operculum branches on the delayed phase. Posterior circulation: The vertebral and basilar arteries are smooth and widely patent. No branch  occlusion, beading, or aneurysm. Venous sinuses: Patent Anatomic variants: None significant Review of the MIP images confirms the above findings These results were called by telephone at the time of interpretation on 05/19/2020 at 11:35 am to provider [facility] , who is already aware. IMPRESSION: 1. 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 2. Mildly indistinct appearance of the proximal left ICA is likely from motion artifact, consider cervical run. No atheromatous changes or vasculopathy seen in the neck. Electronically Signed   By: Marnee Spring M.D.   On: 05/19/2020 11:40   CT Head Wo Contrast  Result Date: 06/01/2020 CLINICAL DATA:  Left ICA and MCA occlusion 05/19/2020 with subsequent intervention and subarachnoid hemorrhage. EXAM: CT HEAD WITHOUT CONTRAST TECHNIQUE: Contiguous axial images were obtained from the base of the skull through the vertex without intravenous contrast. COMPARISON:  CT 05/29/2020. Multiple previous imaging studies as distant as 05/19/2020. FINDINGS: Brain: Brainstem and cerebellum remain normal. Focal hematoma in the anterior interhemispheric fissure continues to become less dense. Infarction in the left cingulate gyrus and left posterior putamen and caudate nucleus as seen previously. Question some infarction also of the left temporal lobe. No hydrocephalus. No subdural collection. Vascular: No new vascular finding. Skull: Negative Sinuses/Orbits: Clear except for near total opacification of the left sphenoid sinus. Orbits negative. Other: None IMPRESSION: 1. Focal hematoma in the anterior interhemispheric fissure continues to become less dense. 2. Infarction in the left cingulate gyrus and left posterior putamen and caudate nucleus as seen previously. Question some infarction also of the left temporal  lobe. Electronically Signed   By: Paulina Fusi M.D.   On: 06/01/2020 11:14   CT Head Wo Contrast  Result Date:  05/29/2020 CLINICAL DATA:  Intracranial hemorrhage follow-up. EXAM: CT HEAD WITHOUT CONTRAST TECHNIQUE: Contiguous axial images were obtained from the base of the skull through the vertex without intravenous contrast. COMPARISON:  CT head 05/24/2020 FINDINGS: Brain: Interval decrease in the size of hyperdense anterior interhemispheric hemorrhage, which is both intraparenchymal and extra-axial. Interval decrease in small volume intraventricular hemorrhage that is layering in the occipital horns bilaterally. No new areas of hemorrhage. Mildly decreased edema associated with a left parafalcine frontal ACA branch territory infarct. Similar edema associated with a left lateral lenticulostriate MCA territory infarct. No new areas of acute large vascular territory infarct. Slight decrease in the size of the temporal horns lateral ventricles, which are mildly rounded. No new mass effect. No substantial midline shift. MIld suprasellar cistern effacement. Vascular: No interval findings. Skull: Negative. Sinuses/Orbits: Left sphenoid opacification with frothy secretions. IMPRESSION: 1. Interval decrease in the size/conspicuity of intraparenchymal, extra-axial, and intraventricular hemorrhage, as above. No new acute hemorrhage. 2. Redemonstrated subacute left ACA and left MCA territory infarcts. No progressive mass effect. 3. Slightly improved mild ventriculomegaly. Electronically Signed   By: Feliberto Harts MD   On: 05/29/2020 08:26   CT HEAD WO CONTRAST  Result Date: 05/24/2020 CLINICAL DATA:  Stroke follow-up.  COVID positive EXAM: CT HEAD WITHOUT CONTRAST TECHNIQUE: Contiguous axial images were obtained from the base of the skull through the vertex without intravenous contrast. COMPARISON:  Yesterday FINDINGS: Brain: Anterior interhemispheric hemorrhage (subarachnoid and also parenchymal) with adjacent left ACA branch infarct that is pericallosal. There is also a lateral lenticulostriate infarct on the left.  Intraventricular hemorrhage with lateral ventriculomegaly that measures similar to yesterday. No evidence of new hemorrhage or new infarct. Vascular: No interval finding Skull: Negative Sinuses/Orbits: Left sphenoid sinus opacification. IMPRESSION: 1. 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 2. Unchanged intracranial hemorrhage with mild lateral ventriculomegaly. Electronically Signed   By: Marnee Spring M.D.   On: 05/24/2020 07:13   CT HEAD WO CONTRAST  Result Date: 05/22/2020 CLINICAL DATA:  24 year old female status post code stroke presentation on 05/19/2020 with left ICA terminus ELVO. Status post endovascular reperfusion, subarachnoid and intra-axial hemorrhage. EXAM: CT HEAD WITHOUT CONTRAST TECHNIQUE: Contiguous axial images were obtained from the base of the skull through the vertex without intravenous contrast. COMPARISON:  Brain MRI 05/20/2020 and earlier. Head CT 05/21/2020 and earlier. FINDINGS: Brain: Decreased intraventricular hemorrhage since yesterday with small volume residual. There is mild ventriculomegaly, possible mild transependymal edema. Left inferior frontal gyrus parasagittal blood which appears to be both intra-and extra-axial encompasses 48 by 19 x 26 mm (AP by transverse by CC), and is stable since yesterday (estimated total volume 12 mL). Associated edema in the medial left inferior frontal gyrus, gyrus rectus and cingulate is stable. Stable mild regional mass effect. No midline shift. Basilar cisterns remain patent. Trace subarachnoid hemorrhage elsewhere. Cytotoxic edema redemonstrated in the left basal ganglia and stable. Stable gray-white matter differentiation throughout the brain. Vascular: No suspicious intracranial vascular hyperdensity now. Skull: No acute osseous abnormality identified. Sinuses/Orbits: Stable fluid levels in the sphenoid sinuses. Other paranasal sinuses and mastoids are stable and well pneumatized. Other: No acute orbit or scalp soft tissue  finding. IMPRESSION: 1. Regressed intraventricular hemorrhage since yesterday with small volume residual. Stable mild ventriculomegaly, and possible mild transependymal edema. 2. Stable left inferior frontal gyrus parasagittal hematoma (approximately 12 mL)  and regional edema. No significant midline shift, basilar cisterns are patent. 3. Stable left basal ganglia infarcts. 4. No new intracranial abnormality. Electronically Signed   By: Gail Montgomery FlemingH  Hall M.D.   On: 05/22/2020 08:40   CT HEAD WO CONTRAST  Result Date: 05/21/2020 CLINICAL DATA:  Stroke, follow-up. EXAM: CT HEAD WITHOUT CONTRAST TECHNIQUE: Contiguous axial images were obtained from the base of the skull through the vertex without intravenous contrast. COMPARISON:  CT head without contrast 05/19/2020 and 05/20/2020. MR head without contrast 05/20/2020. FINDINGS: Brain: Left basal ganglia nonhemorrhagic infarction is again noted. Predominantly extra-axial medial left frontal hemorrhage is again noted, extending into the ventricles bilaterally. No significant change in hemorrhage volume is present. Ventricles are further dilated. No new areas of hemorrhage are present. Additional subarachnoid hemorrhage is now seen posteriorly on the left and along the inter cerebral hemisphere. Hemorrhage is present within the aqueduct of Sylvius. Vascular: No hyperdense vessel or unexpected calcification. Skull: Calvarium is intact. No focal lytic or blastic lesions are present. No significant extracranial soft tissue lesion is present. Sinuses/Orbits: Fluid is present in the sphenoid sinuses bilaterally and in the nasopharynx. The paranasal sinuses and mastoid air cells are otherwise clear. The globes and orbits are within normal limits. IMPRESSION: 1. Stable appearance of left basal ganglia nonhemorrhagic infarction. 2. Stable medial left frontal extra-axial hemorrhage, extending into the ventricles bilaterally. 3. Subarachnoid hemorrhage posteriorly on the left and along  the inter cerebral hemisphere likely represents redistribution without significant new hemorrhage. 4. Hemorrhage is present within the aqueduct of Sylvius. 5. Progressive dilation of the ventricles compatible with developing hydrocephalus. 6. No new areas of hemorrhage. Electronically Signed   By: Marin Robertshristopher  Mattern M.D.   On: 05/21/2020 06:39   CT HEAD WO CONTRAST  Result Date: 05/20/2020 CLINICAL DATA:  Intracranial hemorrhage follow up EXAM: CT HEAD WITHOUT CONTRAST TECHNIQUE: Contiguous axial images were obtained from the base of the skull through the vertex without intravenous contrast. COMPARISON:  05/19/2020 FINDINGS: Brain: There is subarachnoid hemorrhage again along the anterior interhemispheric fissure with extension into both lateral ventricles. The temporal horn of the left lateral ventricle has slightly increased in size. The amount of blood in the ventricles is also slightly greater. There is no herniation. Vascular: No hyperdense vessel or unexpected calcification. Skull: Normal. Negative for fracture or focal lesion. Sinuses/Orbits: No acute finding. Other: None. IMPRESSION: 1. Subarachnoid hemorrhage along the anterior interhemispheric fissure with slightly increased extension into both lateral ventricles. 2. Slight increase in size of the temporal horn of the left lateral ventricle consistent with early communicating hydrocephalus. Electronically Signed   By: Deatra RobinsonKevin  Herman M.D.   On: 05/20/2020 01:31   CT HEAD WO CONTRAST  Result Date: 05/19/2020 CLINICAL DATA:  Patient status post intervention for left internal carotid and MCA occlusion. EXAM: CT HEAD WITHOUT CONTRAST TECHNIQUE: Contiguous axial images were obtained from the base of the skull through the vertex without intravenous contrast. COMPARISON:  Head CT earlier today. FINDINGS: Brain: The patient has a new large volume of subarachnoid and intraventricular hemorrhage centered anteriorly at the circle-of-Willis. Hemorrhage extends  into the fourth ventricle. There is no midline shift or hydrocephalus. Gray-white differentiation appears preserved. No mass is identified. Imaged intracranial contents appear normal. Vascular: Large vessel structures are largely opacified with contrast. Skull: Intact. Sinuses/Orbits: Negative. Other: None. IMPRESSION: New large volume of subarachnoid and intraventricular hemorrhage. Critical Value/emergent results were called by telephone at the time of interpretation on 05/19/2020 at 3:38 pm to provider Dr. Amada JupiterKirkpatrick, who  verbally acknowledged these results. Electronically Signed   By: Gail Montgomery M.D.   On: 05/19/2020 15:42   CT Code Stroke CTA Neck W/WO contrast  Result Date: 05/19/2020 CLINICAL DATA:  Stroke suspected. EXAM: CT ANGIOGRAPHY HEAD AND NECK TECHNIQUE: Multidetector CT imaging of the head and neck was performed using the standard protocol during bolus administration of intravenous contrast. Multiplanar CT image reconstructions and MIPs were obtained to evaluate the vascular anatomy. Carotid stenosis measurements (when applicable) are obtained utilizing NASCET criteria, using the distal internal carotid diameter as the denominator. CONTRAST:  Dose is currently not known COMPARISON:  Head CT reported separately FINDINGS: CTA NECK FINDINGS Aortic arch: Negative.  Three vessel branching Right carotid system: No atheromatous changes, dissection, or beading. Left carotid system: Mild motion artifact which could account for the mildly smudged appearance of the left ICA proximally. Vertebral arteries: No proximal subclavian stenosis. Codominant vertebral arteries that are smooth and widely patent to the dura. Skeleton: No acute or aggressive finding Other neck: Negative Upper chest: Negative Review of the MIP images confirms the above findings CTA HEAD FINDINGS Anterior circulation: Cut off at the left ICA terminus with visible luminal clot. There is continuation of non opacification throughout  the left M1 segment which is clot by CT. Non opacification at the left A1 origin. No contralateral branch occlusion is seen. Intravascular enhancement seen in the left operculum branches on the delayed phase. Posterior circulation: The vertebral and basilar arteries are smooth and widely patent. No branch occlusion, beading, or aneurysm. Venous sinuses: Patent Anatomic variants: None significant Review of the MIP images confirms the above findings These results were called by telephone at the time of interpretation on 05/19/2020 at 11:35 am to provider [facility] , who is already aware. IMPRESSION: 1. 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 2. Mildly indistinct appearance of the proximal left ICA is likely from motion artifact, consider cervical run. No atheromatous changes or vasculopathy seen in the neck. Electronically Signed   By: Marnee Spring M.D.   On: 05/19/2020 11:40   CT ANGIO CHEST PE W OR WO CONTRAST  Result Date: 05/28/2020 CLINICAL DATA:  24 year old female with tachycardia, COVID and positive D-dimer. EXAM: CT ANGIOGRAPHY CHEST WITH CONTRAST TECHNIQUE: Multidetector CT imaging of the chest was performed using the standard protocol during bolus administration of intravenous contrast. Multiplanar CT image reconstructions and MIPs were obtained to evaluate the vascular anatomy. CONTRAST:  60mL OMNIPAQUE IOHEXOL 350 MG/ML SOLN COMPARISON:  None. FINDINGS: Cardiovascular: This is a technically adequate study, but motion artifact does limit evaluation in some areas. Multiple pulmonary emboli are identified and within the LEFT LOWER lobe pulmonary artery, and multiple bilateral segmental LOWER lobe pulmonary arteries. Equivocal emboli within LEFT UPPER lobe segmental pulmonary arteries noted. Mild cardiomegaly is present.  RV/LV ratio equals 0.84. There is no evidence of thoracic aortic aneurysm or pericardial effusion. Mediastinum/Nodes: No enlarged  mediastinal, hilar, or axillary lymph nodes. Thyroid gland, trachea, and esophagus demonstrate no significant findings. Lungs/Pleura: No airspace disease, consolidation, mass or nodule. No pleural effusion or pneumothorax. Upper Abdomen: No acute abnormality. Musculoskeletal: No chest wall abnormality. No acute or significant osseous findings. Review of the MIP images confirms the above findings. IMPRESSION: 1. Bilateral segmental LOWER lobe pulmonary emboli, LEFT-greater-than-RIGHT. Equivocal segmental emboli within the LEFT UPPER lobe. 2. Mild cardiomegaly. Critical Value/emergent results were called by telephone at the time of interpretation on 05/28/2020 at 2:41 pm to provider Arlys John, nurse on this  floor,, who verbally acknowledged these results. Electronically Signed   By: Harmon Pier M.D.   On: 05/28/2020 14:45   MR BRAIN WO CONTRAST  Result Date: 05/20/2020 CLINICAL DATA:  Sudden onset aphasia and right-sided weakness EXAM: MRI HEAD WITHOUT CONTRAST TECHNIQUE: Multiplanar, multiecho pulse sequences of the brain and surrounding structures were obtained without intravenous contrast. COMPARISON:  Head CT 05/20/2020 FINDINGS: Brain: Early subacute infarct of the left basal ganglia involving the posterior putamen and caudate body. There is subarachnoid hemorrhage along the interhemispheric fissure extending into the lateral ventricles. There is early communicating hydrocephalus as evidenced by slightly increased size of the temporal horns. There is no herniation or other mass effect Vascular: Normal flow voids Skull and upper cervical spine: Normal Sinuses/Orbits: Normal Other: Fluid in the nasopharynx. IMPRESSION: 1. Early subacute infarct of the left basal ganglia involving the posterior putamen and caudate body. 2. Subarachnoid hemorrhage along the interhemispheric fissure extending into the lateral ventricles. 3. 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  Electronically Signed   By: Deatra Robinson M.D.   On: 05/20/2020 02:23   IR CT Head Ltd  Result Date: 05/21/2020 INDICATION: 24 year old female with a history of acute left MCA syndrome, presents for mechanical thrombectomy COVID test was confirmed positive after initiation of the case EXAM: ULTRASOUND-GUIDED RIGHT COMMON FEMORAL ARTERY ACCESS CERVICAL AND CEREBRAL ANGIOGRAM MECHANICAL THROMBECTOMY LEFT MCA ANGIO-SEAL FOR HEMOSTASIS COMPARISON:  CT imaging same day MEDICATIONS: 200 mcg intra arterial nitroglycerin. ANESTHESIA/SEDATION: The anesthesia team was present to provide general endotracheal tube anesthesia and for patient monitoring during the procedure. Intubation was performed in negative pressure [place] in neuro IR holding. Left radial arterial line was performed by the anesthesia team. Interventional neuro radiology nursing staff was also present. CONTRAST:  150 cc FLUOROSCOPY TIME:  Fluoroscopy Time: 53 minutes 36 seconds (1651 mGy). COMPLICATIONS: SIR LEVEL C - Requires therapy, minor hospitalization (<48 hrs). Subarachnoid hemorrhage TECHNIQUE: Informed written consent was obtained from the patient's family after a thorough discussion of the procedural risks, benefits and alternatives. Specific risks discussed include: Bleeding, infection, contrast reaction, kidney injury/failure, need for further procedure/surgery, arterial injury or dissection, embolization to new territory, intracranial hemorrhage (10-15% risk), neurologic deterioration, cardiopulmonary collapse, death. All questions were addressed. Maximal Sterile Barrier Technique was utilized including during the procedure including caps, mask, sterile gowns, sterile gloves, sterile drape, hand hygiene and skin antiseptic. A timeout was performed prior to the initiation of the procedure. The anesthesia team was present to provide general endotracheal tube anesthesia and for patient monitoring during the procedure. Interventional neuro radiology  nursing staff was also present. FINDINGS: Initial Findings: Left common carotid artery: ICA patent with no tortuosity and no significant atherosclerosis. Separate branch from the aortic arch. Left external carotid artery: Patent with antegrade flow. Left internal carotid artery: The common carotid artery is relatively straight with no beading or significant narrowing. The AP image on the initial image demonstrates perhaps a slight irregularity on the lateral intimal surface which may correspond to the coronal reformatted images on the comparison CT preprocedure (image 25 of series 9). Given that there was no flow limiting problem, we elected to proceed with the thrombectomy Left MCA: The vertical horizontal petrous segment patent. Segment lacerum patent. Cavernous and supraclinoid patent. Occlusion of the proximal M1 segment with visible filling defect compatible with the findings on the CT. Left ACA: A1 is patent. There is a nearly 180 degree angulation of the A1 origin. Patent anterior communicating  artery. There has been migration of the thromboembolism through the terminal segment into the A2 segment compared to the prior CT. Procedural findings: Baseline MCA flow: TICI 0 Left MCA: After 1 pass, there is no significant restoration of flow. Upon placement of the stent tree bronch the second attempt, and angiogram demonstrated no significant flow through the solitaire. After the second pass, complete restoration of flow through the MCA territory was achieved, TICI 3. During this time, there was further migration of the A2 embolus to the pericallosal artery, with the ultimate occlusion approximately level to the coronal suture. Given that the anatomy would not allow and navigation of the microcatheter and intermediate catheter into the segment, and given the perceived density of the ACA thromboembolism, we ultimately withdrew from further attempts thrombectomy when the wire and microcatheter would not pass through  the face of the thrombus. Final angiogram demonstrated no evidence of extravasation of contrast at this level. The flat panel CT demonstrates inter hemispheric subarachnoid hemorrhage without mass effect. Angiogram of the cervical ICA upon withdrawal of the balloon catheter demonstrated long segment irregularity, with variations in the diameter extending from beyond the bifurcation to the horizontal petrous segment. The irregularity was not only spatial but variations in temporal appearance, as the narrowing worsen to greater than 80%, such as that demonstrated on image 5/7 angiogram 23. After 20 minutes of placing orogastric tube and giving dual antiplatelets dosage, anticipating stenting, a repeat angiogram demonstrated improvement in the appearance of the diameter, image 5/7 of angiogram 24. At this time we elected not to stent. PROCEDURE: The anesthesia team was present to provide general endotracheal tube anesthesia and for patient monitoring during the procedure. Intubation was performed in negative pressure [place] in neuro IR holding. Interventional neuro radiology nursing staff was also present. Ultrasound survey of the right inguinal region was performed with images stored and sent to PACs. 11 blade scalpel was used to make a small incision. Blunt dissection was performed with US guidance. A micropuncture needle was used access the right common femoral artery under ultrasound. With excellent arterial blood flow returned, an .018 micro wire was passed through the needle, observed to enter the abdominal aorta under fluoroscopy. The needle was removed, and a micropuncture sheath was placed over the wire. The inner dilator and wire were removed, and an 035 wire was advanced under fluoroscopy into the abdominal aorta. The sheath was removed and a 25cm 47F straight vascular sheath was placed. The dilator was removed and the sheath was flushed. Sheath was attached to pressurized and heparinized saline bag for constant  forward flow. A coaxial system was then advanced over the 035 wire. This included a 95cm 087 "Walrus" balloon guide with coaxial 125cm Berenstein diagnostic catheter. This was advanced to the proximal descending thoracic aorta. Wire was then removed. Double flush of the catheter was performed. Catheter was then used to select the left common carotid artery. Angiogram was performed. Using roadmap technique, the catheter was advanced over a standard glide wire into left cervical ICA, with luminal position achieved of the guide catheter. Wire was removed and angiogram was performed of the left carotid artery. Initial angiogram revealed no intimal/luminal irregularity and we proceeded with placement of the balloon guide into the distal cervical ICA. The balloon guide and the parents teen catheter were advanced with a Glidewire into the distal cervical segment of the ICA. Glidewire and the Berenstein catheter were gently removed and double flush was performed. Formal angiogram was performed. Road map function was  used once the occluded vessel was identified. Copious back flush was performed and the balloon catheter was attached to heparinized and pressurized saline bag for forward flow. A second coaxial system was then advanced through the balloon catheter, which included the selected intermediate catheter, microcatheter, and microwire. In this scenario, the set up included intermediate catheter, large-bore aspiration catheter, 135 cm, a Trevo Provue18 microcatheter, and 014 synchro soft wire. This system was advanced through the balloon guide catheter under the road-map function, with adequate back-flush at the rotating hemostatic valve at that back end of the balloon guide. Microcatheter and the intermediate catheter system were advanced through the terminal ICA and MCA to the level of the occlusion. The micro wire was then carefully advanced through the occluded segment. Microcatheter was then manipulated through the  occluded segment and the wire was removed with saline drip at the hub. Blood was then aspirated through the hub of the microcatheter, and a gentle contrast injection was performed confirming intraluminal position. A rotating hemostatic valve was then attached to the back end of the microcatheter, and a pressurized and heparinized saline bag was attached to the catheter. 4 x 40 solitaire device was then selected. Back flush was achieved at the rotating hemostatic valve, and then the device was gently advanced through the microcatheter to the distal end. The retriever was then unsheathed by withdrawing the microcatheter under fluoroscopy. Once the retriever was completely unsheathed, the microcatheter was carefully stripped from the delivery device. A 3 minute time interval was observed. The balloon at the balloon guide catheter was then inflated under fluoroscopy for proximal flow arrest. Constant aspiration using the proprietary engine was then performed at the intermediate catheter, as the retriever was gently and slowly withdrawn with fluoroscopic observation. Once the retriever was "corked" within the tip of the intermediate catheter, both were removed from the system. Free aspiration was confirmed at the hub of the balloon guide catheter, with free blood return confirmed. The balloon was then deflated, and a control angiogram was performed. The M1 segment remained occluded after the first pass. We then attempted a second pass. Microcatheter and the intermediate catheter system were again advanced through the terminal ICA and MCA to the level of the occlusion. The micro wire was then carefully advanced through the occluded segment. Microcatheter was then manipulated through the occluded segment and the wire was removed with saline drip at the hub. Blood was then aspirated through the hub of the microcatheter, and a gentle contrast injection was performed confirming intraluminal position. A rotating hemostatic  valve was then attached to the back end of the microcatheter, and a pressurized and heparinized saline bag was attached to the catheter. 4 x 40 solitaire device was again used. Back flush was achieved at the rotating hemostatic valve, and then the device was gently advanced through the microcatheter to the distal end. The retriever was then unsheathed by withdrawing the microcatheter under fluoroscopy. Once the retriever was completely unsheathed, the microcatheter was carefully stripped from the delivery device. Control angiogram was performed from the intermediate catheter, demonstrating that there was no flow through the open solitaire. A 3 minute time interval was observed. The balloon at the balloon guide catheter was then inflated under fluoroscopy for proximal flow arrest. Aspiration engine was turned on. Intermediate catheter was gently advanced over the proximal solitaire to the level of the occlusion, with flow arrest. Constant aspiration using the proprietary engine was then performed at the intermediate catheter, as both the retriever and the intermediate  catheter were gently and slowly withdrawn with fluoroscopic observation. Once the retriever was "corked" within the tip of the intermediate catheter, both were removed from the system. Free aspiration was confirmed at the hub of the balloon guide catheter, with free blood return confirmed. The balloon was then deflated, and a control angiogram was performed. Restoration of left MCA flow was confirmed. The initial left A2 segment occlusion was observed to have migrated distally to the pericallosal artery. Given the patient's young age we decided to proceed with attempt at mechanical thrombectomy of this ACA embolus. ACA thrombectomy attempt: The walrus balloon catheter remained in the distal cervical segment, proximal to the horizontal petrous segment. We advanced a coaxial wire and catheter system which included 014 synchro soft wire, Zoom 35  intermediate catheter, and tree though pro view 18 microcatheter. This was advanced through the cavernous segment to the supraclinoid segment. This catheter and wire combination failed to engage the angulated A1 origin. We then attempted a coaxial system which included CT 5 intermediate catheter, 150cm phenom 21 microcatheter, and synchro soft wire. This coaxial system was advanced through the cavernous segment, supraclinoid segment, and failed to engage the A1 segment. Various micro wires were then used including Aristotle wire, and double angle headliner wire. The 014 headliner wire was ultimately successful engaging the A1 segment, with a pericallosal position achieved. The intermediate catheter would not make the nearly 180 degree turn into the A1 segment. After several attempts at achieving n/a 1 position with the intermediate catheter, we decided that we could attempt to use an anchoring technique by deploying stent tree verb across the embolism, and then bringing the intermediate catheter over the microcatheter and stent deployed stent tree verb. This would require, however, passing through the embolus with the microcatheter to deployed the stent tree verge. The microcatheter and microwire combination would not pass gently through the thromboembolism within the pericallosal artery, with quite a dense character observed when the wire and microcatheter were at the face of the thrombus. Ultimately we decided to withdraw from this attempt given the difficulty, and the apparent reactivity of the patient's carotid artery as we observed spasm and irregularity at the skull base on each subsequent control angiogram. The microcatheter was withdrawn to the cavernous segment of the ICA. Wire was removed. Exchange length floppy tip transcend wire was then placed through the microcatheter into a safe position. The balloon guide catheter was then withdrawn into the common carotid artery and angiogram was performed. This  revealed irregularity and narrowing in the proximal ICA, which continued with multiple variation in diameter through the cervical ICA to the distal segment at the site of the prior inflated balloon. These findings were discussed with the neurology team, with concern for dissection, as there was irregularity at this segment on the preprocedural CT scan, indicating possible vasculitis/dissection as not only the current imaging diagnosis, but potentially site of the thromboembolism origin. At this point we performed flat panel CT scan to observe for ICH/S AH, as treatment with a stent would require dual anti-platelet therapy. Flat panel CT demonstrated thin subarachnoid hemorrhage in the inter hemispheric fissure. Interval angiogram of the ICA was performed to determine the need for any possible stenting. 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 This imaging finding was concerning for progression of a possible intramural hematoma/dissection and at this point we elected to move forward with stenting. At least 20 minutes were required to place an orogastric  tube and then deliver our prescribed dose of aspirin and Plavix. Once these events had occurred, repeat angiogram was performed. Interestingly this image revealed significant improvement at the cervical segment, with residual irregularity (image XA #24). At this point we interpreted this as either improving vaso spasm or improving sequela of vasculitis/dissection. Given the flat panel head CT findings and the desire to avoid dual anti-platelet therapy long-term, we elected to withdraw from the case at this point with no stent deployed. Balloon guide was then removed, with all catheters and wires removed. The skin at the puncture site was then cleaned with Chlorhexidine. The 8 French sheath was removed and an 11F angioseal was deployed. Patient tolerated the procedure well and remained  hemodynamically stable throughout. Blood loss estimated at 150 cc. IMPRESSION: Status post ultrasound guided access right common femoral artery for cervical/cerebral angiogram and mechanical thrombectomy of left MCA restoring complete flow to the left MCA territory, and failed attempt at revascularization of left pericallosal thromboembolism. Flat panel CT at the completion demonstrates thin subarachnoid hemorrhage in the anterior inter hemispheric fissure. Angio-Seal deployed for hemostasis. Signed, Gail Montgomery. Gail Montgomery, RPVI [facility] Radiology PLAN: The patient will remain intubated, as she remains COVID status unknown ICU status Target systolic blood pressure of 140-160 Right hip straight time 6 hours Frequent neurovascular checks Repeat neurologic imaging with CT and/MRI at the discretion of neurology team Electronically Signed   By: Gail Montgomery D.O.   On: 05/21/2020 08:57   IR CT Head Ltd  Result Date: 05/21/2020 INDICATION: 24 year old female with a history of acute left MCA syndrome, presents for mechanical thrombectomy COVID test was confirmed positive after initiation of the case EXAM: ULTRASOUND-GUIDED RIGHT COMMON FEMORAL ARTERY ACCESS CERVICAL AND CEREBRAL ANGIOGRAM MECHANICAL THROMBECTOMY LEFT MCA ANGIO-SEAL FOR HEMOSTASIS COMPARISON:  CT imaging same day MEDICATIONS: 200 mcg intra arterial nitroglycerin. ANESTHESIA/SEDATION: The anesthesia team was present to provide general endotracheal tube anesthesia and for patient monitoring during the procedure. Intubation was performed in negative pressure Bay in neuro IR holding. Left radial arterial line was performed by the anesthesia team. Interventional neuro radiology nursing staff was also present. CONTRAST:  150 cc FLUOROSCOPY TIME:  Fluoroscopy Time: 53 minutes 36 seconds (1651 mGy). COMPLICATIONS: SIR LEVEL C - Requires therapy, minor hospitalization (<48 hrs). Subarachnoid hemorrhage TECHNIQUE:  Informed written consent was obtained from the patient's family after a thorough discussion of the procedural risks, benefits and alternatives. Specific risks discussed include: Bleeding, infection, contrast reaction, kidney injury/failure, need for further procedure/surgery, arterial injury or dissection, embolization to new territory, intracranial hemorrhage (10-15% risk), neurologic deterioration, cardiopulmonary collapse, death. All questions were addressed. Maximal Sterile Barrier Technique was utilized including during the procedure including caps, mask, sterile gowns, sterile gloves, sterile drape, hand hygiene and skin antiseptic. A timeout was performed prior to the initiation of the procedure. The anesthesia team was present to provide general endotracheal tube anesthesia and for patient monitoring during the procedure. Interventional neuro radiology nursing staff was also present. FINDINGS: Initial Findings: Left common carotid artery: ICA patent with no tortuosity and no significant atherosclerosis. Separate branch from the aortic arch. Left external carotid artery: Patent with antegrade flow. Left internal carotid artery: The common carotid artery is relatively straight with no beading or significant narrowing. The AP image on the initial image demonstrates perhaps a slight irregularity on the lateral intimal surface which may correspond to the coronal reformatted images on the comparison CT preprocedure (image 25 of series 9). Given that  there was no flow limiting problem, we elected to proceed with the thrombectomy Left MCA: The vertical horizontal petrous segment patent. Segment lacerum patent. Cavernous and supraclinoid patent. Occlusion of the proximal M1 segment with visible filling defect compatible with the findings on the CT. Left ACA: A1 is patent. There is a nearly 180 degree angulation of the A1 origin. Patent anterior communicating artery. There has been migration of the thromboembolism  through the terminal segment into the A2 segment compared to the prior CT. Procedural findings: Baseline MCA flow: TICI 0 Left MCA: After 1 pass, there is no significant restoration of flow. Upon placement of the stent tree bronch the second attempt, and angiogram demonstrated no significant flow through the solitaire. After the second pass, complete restoration of flow through the MCA territory was achieved, TICI 3. During this time, there was further migration of the A2 embolus to the pericallosal artery, with the ultimate occlusion approximately level to the coronal suture. Given that the anatomy would not allow and navigation of the microcatheter and intermediate catheter into the segment, and given the perceived density of the ACA thromboembolism, we ultimately withdrew from further attempts thrombectomy when the wire and microcatheter would not pass through the face of the thrombus. Final angiogram demonstrated no evidence of extravasation of contrast at this level. The flat panel CT demonstrates inter hemispheric subarachnoid hemorrhage without mass effect. Angiogram of the cervical ICA upon withdrawal of the balloon catheter demonstrated long segment irregularity, with variations in the diameter extending from beyond the bifurcation to the horizontal petrous segment. The irregularity was not only spatial but variations in temporal appearance, as the narrowing worsen to greater than 80%, such as that demonstrated on image 5/7 angiogram 23. After 20 minutes of placing orogastric tube and giving dual antiplatelets dosage, anticipating stenting, a repeat angiogram demonstrated improvement in the appearance of the diameter, image 5/7 of angiogram 24. At this time we elected not to stent. PROCEDURE: The anesthesia team was present to provide general endotracheal tube anesthesia and for patient monitoring during the procedure. Intubation was performed in negative pressure Bay in neuro IR holding. Interventional  neuro radiology nursing staff was also present. Ultrasound survey of the right inguinal region was performed with images stored and sent to PACs. 11 blade scalpel was used to make a small incision. Blunt dissection was performed with US guidance. A micropuncture needle was used access the right common femoral artery under ultrasound. With excellent arterial blood flow returned, an .018 micro wire was passed through the needle, observed to enter the abdominal aorta under fluoroscopy. The needle was removed, and a micropuncture sheath was placed over the wire. The inner dilator and wire were removed, and an 035 wire was advanced under fluoroscopy into the abdominal aorta. The sheath was removed and a 25cm 57F straight vascular sheath was placed. The dilator was removed and the sheath was flushed. Sheath was attached to pressurized and heparinized saline bag for constant forward flow. A coaxial system was then advanced over the 035 wire. This included a 95cm 087 "Walrus" balloon guide with coaxial 125cm Berenstein diagnostic catheter. This was advanced to the proximal descending thoracic aorta. Wire was then removed. Double flush of the catheter was performed. Catheter was then used to select the left common carotid artery. Angiogram was performed. Using roadmap technique, the catheter was advanced over a standard glide wire into left cervical ICA, with luminal position achieved of the guide catheter. Wire was removed and angiogram was performed of the left  carotid artery. Initial angiogram revealed no intimal/luminal irregularity and we proceeded with placement of the balloon guide into the distal cervical ICA. The balloon guide and the parents teen catheter were advanced with a Glidewire into the distal cervical segment of the ICA. Glidewire and the Berenstein catheter were gently removed and double flush was performed. Formal angiogram was performed. Road map function was used once the occluded vessel was identified.  Copious back flush was performed and the balloon catheter was attached to heparinized and pressurized saline bag for forward flow. A second coaxial system was then advanced through the balloon catheter, which included the selected intermediate catheter, microcatheter, and microwire. In this scenario, the set up included intermediate catheter, large-bore aspiration catheter, 135 cm, a Trevo Provue18 microcatheter, and 014 synchro soft wire. This system was advanced through the balloon guide catheter under the road-map function, with adequate back-flush at the rotating hemostatic valve at that back end of the balloon guide. Microcatheter and the intermediate catheter system were advanced through the terminal ICA and MCA to the level of the occlusion. The micro wire was then carefully advanced through the occluded segment. Microcatheter was then manipulated through the occluded segment and the wire was removed with saline drip at the hub. Blood was then aspirated through the hub of the microcatheter, and a gentle contrast injection was performed confirming intraluminal position. A rotating hemostatic valve was then attached to the back end of the microcatheter, and a pressurized and heparinized saline bag was attached to the catheter. 4 x 40 solitaire device was then selected. Back flush was achieved at the rotating hemostatic valve, and then the device was gently advanced through the microcatheter to the distal end. The retriever was then unsheathed by withdrawing the microcatheter under fluoroscopy. Once the retriever was completely unsheathed, the microcatheter was carefully stripped from the delivery device. A 3 minute time interval was observed. The balloon at the balloon guide catheter was then inflated under fluoroscopy for proximal flow arrest. Constant aspiration using the proprietary engine was then performed at the intermediate catheter, as the retriever was gently and slowly withdrawn with fluoroscopic  observation. Once the retriever was "corked" within the tip of the intermediate catheter, both were removed from the system. Free aspiration was confirmed at the hub of the balloon guide catheter, with free blood return confirmed. The balloon was then deflated, and a control angiogram was performed. The M1 segment remained occluded after the first pass. We then attempted a second pass. Microcatheter and the intermediate catheter system were again advanced through the terminal ICA and MCA to the level of the occlusion. The micro wire was then carefully advanced through the occluded segment. Microcatheter was then manipulated through the occluded segment and the wire was removed with saline drip at the hub. Blood was then aspirated through the hub of the microcatheter, and a gentle contrast injection was performed confirming intraluminal position. A rotating hemostatic valve was then attached to the back end of the microcatheter, and a pressurized and heparinized saline bag was attached to the catheter. 4 x 40 solitaire device was again used. Back flush was achieved at the rotating hemostatic valve, and then the device was gently advanced through the microcatheter to the distal end. The retriever was then unsheathed by withdrawing the microcatheter under fluoroscopy. Once the retriever was completely unsheathed, the microcatheter was carefully stripped from the delivery device. Control angiogram was performed from the intermediate catheter, demonstrating that there was no flow through the open solitaire. A 3  minute time interval was observed. The balloon at the balloon guide catheter was then inflated under fluoroscopy for proximal flow arrest. Aspiration engine was turned on. Intermediate catheter was gently advanced over the proximal solitaire to the level of the occlusion, with flow arrest. Constant aspiration using the proprietary engine was then performed at the intermediate catheter, as both the retriever and  the intermediate catheter were gently and slowly withdrawn with fluoroscopic observation. Once the retriever was "corked" within the tip of the intermediate catheter, both were removed from the system. Free aspiration was confirmed at the hub of the balloon guide catheter, with free blood return confirmed. The balloon was then deflated, and a control angiogram was performed. Restoration of left MCA flow was confirmed. The initial left A2 segment occlusion was observed to have migrated distally to the pericallosal artery. Given the patient's young age we decided to proceed with attempt at mechanical thrombectomy of this ACA embolus. ACA thrombectomy attempt: The walrus balloon catheter remained in the distal cervical segment, proximal to the horizontal petrous segment. We advanced a coaxial wire and catheter system which included 014 synchro soft wire, Zoom 35 intermediate catheter, and tree though pro view 18 microcatheter. This was advanced through the cavernous segment to the supraclinoid segment. This catheter and wire combination failed to engage the angulated A1 origin. We then attempted a coaxial system which included CT 5 intermediate catheter, 150cm phenom 21 microcatheter, and synchro soft wire. This coaxial system was advanced through the cavernous segment, supraclinoid segment, and failed to engage the A1 segment. Various micro wires were then used including Aristotle wire, and double angle headliner wire. The 014 headliner wire was ultimately successful engaging the A1 segment, with a pericallosal position achieved. The intermediate catheter would not make the nearly 180 degree turn into the A1 segment. After several attempts at achieving n/a 1 position with the intermediate catheter, we decided that we could attempt to use an anchoring technique by deploying stent tree verb across the embolism, and then bringing the intermediate catheter over the microcatheter and stent deployed stent tree verb. This  would require, however, passing through the embolus with the microcatheter to deployed the stent tree verge. The microcatheter and microwire combination would not pass gently through the thromboembolism within the pericallosal artery, with quite a dense character observed when the wire and microcatheter were at the face of the thrombus. Ultimately we decided to withdraw from this attempt given the difficulty, and the apparent reactivity of the patient's carotid artery as we observed spasm and irregularity at the skull base on each subsequent control angiogram. The microcatheter was withdrawn to the cavernous segment of the ICA. Wire was removed. Exchange length floppy tip transcend wire was then placed through the microcatheter into a safe position. The balloon guide catheter was then withdrawn into the common carotid artery and angiogram was performed. This revealed irregularity and narrowing in the proximal ICA, which continued with multiple variation in diameter through the cervical ICA to the distal segment at the site of the prior inflated balloon. These findings were discussed with the neurology team, with concern for dissection, as there was irregularity at this segment on the preprocedural CT scan, indicating possible vasculitis/dissection as not only the current imaging diagnosis, but potentially site of the thromboembolism origin. At this point we performed flat panel CT scan to observe for ICH/S AH, as treatment with a stent would require dual anti-platelet therapy. Flat panel CT demonstrated thin subarachnoid hemorrhage in the inter hemispheric fissure. Interval  angiogram of the ICA was performed to determine the need for any possible stenting. 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 This imaging finding was concerning for progression of a possible intramural hematoma/dissection and at this point we elected to move forward with  stenting. At least 20 minutes were required to place an orogastric tube and then deliver our prescribed dose of aspirin and Plavix. Once these events had occurred, repeat angiogram was performed. Interestingly this image revealed significant improvement at the cervical segment, with residual irregularity (image XA #24). At this point we interpreted this as either improving vaso spasm or improving sequela of vasculitis/dissection. Given the flat panel head CT findings and the desire to avoid dual anti-platelet therapy long-term, we elected to withdraw from the case at this point with no stent deployed. Balloon guide was then removed, with all catheters and wires removed. The skin at the puncture site was then cleaned with Chlorhexidine. The 8 French sheath was removed and an 81F angioseal was deployed. Patient tolerated the procedure well and remained hemodynamically stable throughout. Blood loss estimated at 150 cc. IMPRESSION: Status post ultrasound guided access right common femoral artery for cervical/cerebral angiogram and mechanical thrombectomy of left MCA restoring complete flow to the left MCA territory, and failed attempt at revascularization of left pericallosal thromboembolism. Flat panel CT at the completion demonstrates thin subarachnoid hemorrhage in the anterior inter hemispheric fissure. Angio-Seal deployed for hemostasis. Signed, Gail Montgomery. Gail Montgomery, RPVI [facility] Radiology PLAN: The patient will remain intubated, as she remains COVID status unknown ICU status Target systolic blood pressure of 140-160 Right hip straight time 6 hours Frequent neurovascular checks Repeat neurologic imaging with CT and/MRI at the discretion of neurology team Electronically Signed   By: Gail Montgomery D.O.   On: 05/21/2020 08:57   IR US Guide Vasc Access Right  Result Date: 05/21/2020 INDICATION: 24 year old female with a history of acute left MCA syndrome, presents  for mechanical thrombectomy COVID test was confirmed positive after initiation of the case EXAM: ULTRASOUND-GUIDED RIGHT COMMON FEMORAL ARTERY ACCESS CERVICAL AND CEREBRAL ANGIOGRAM MECHANICAL THROMBECTOMY LEFT MCA ANGIO-SEAL FOR HEMOSTASIS COMPARISON:  CT imaging same day MEDICATIONS: 200 mcg intra arterial nitroglycerin. ANESTHESIA/SEDATION: The anesthesia team was present to provide general endotracheal tube anesthesia and for patient monitoring during the procedure. Intubation was performed in negative pressure Bay in neuro IR holding. Left radial arterial line was performed by the anesthesia team. Interventional neuro radiology nursing staff was also present. CONTRAST:  150 cc FLUOROSCOPY TIME:  Fluoroscopy Time: 53 minutes 36 seconds (1651 mGy). COMPLICATIONS: SIR LEVEL C - Requires therapy, minor hospitalization (<48 hrs). Subarachnoid hemorrhage TECHNIQUE: Informed written consent was obtained from the patient's family after a thorough discussion of the procedural risks, benefits and alternatives. Specific risks discussed include: Bleeding, infection, contrast reaction, kidney injury/failure, need for further procedure/surgery, arterial injury or dissection, embolization to new territory, intracranial hemorrhage (10-15% risk), neurologic deterioration, cardiopulmonary collapse, death. All questions were addressed. Maximal Sterile Barrier Technique was utilized including during the procedure including caps, mask, sterile gowns, sterile gloves, sterile drape, hand hygiene and skin antiseptic. A timeout was performed prior to the initiation of the procedure. The anesthesia team was present to provide general endotracheal tube anesthesia and for patient monitoring during the procedure. Interventional neuro radiology nursing staff was also present. FINDINGS: Initial Findings: Left common carotid artery: ICA patent with no tortuosity and no significant atherosclerosis. Separate branch  from the aortic arch. Left  external carotid artery: Patent with antegrade flow. Left internal carotid artery: The common carotid artery is relatively straight with no beading or significant narrowing. The AP image on the initial image demonstrates perhaps a slight irregularity on the lateral intimal surface which may correspond to the coronal reformatted images on the comparison CT preprocedure (image 25 of series 9). Given that there was no flow limiting problem, we elected to proceed with the thrombectomy Left MCA: The vertical horizontal petrous segment patent. Segment lacerum patent. Cavernous and supraclinoid patent. Occlusion of the proximal M1 segment with visible filling defect compatible with the findings on the CT. Left ACA: A1 is patent. There is a nearly 180 degree angulation of the A1 origin. Patent anterior communicating artery. There has been migration of the thromboembolism through the terminal segment into the A2 segment compared to the prior CT. Procedural findings: Baseline MCA flow: TICI 0 Left MCA: After 1 pass, there is no significant restoration of flow. Upon placement of the stent tree bronch the second attempt, and angiogram demonstrated no significant flow through the solitaire. After the second pass, complete restoration of flow through the MCA territory was achieved, TICI 3. During this time, there was further migration of the A2 embolus to the pericallosal artery, with the ultimate occlusion approximately level to the coronal suture. Given that the anatomy would not allow and navigation of the microcatheter and intermediate catheter into the segment, and given the perceived density of the ACA thromboembolism, we ultimately withdrew from further attempts thrombectomy when the wire and microcatheter would not pass through the face of the thrombus. Final angiogram demonstrated no evidence of extravasation of contrast at this level. The flat panel CT demonstrates inter hemispheric subarachnoid hemorrhage without mass  effect. Angiogram of the cervical ICA upon withdrawal of the balloon catheter demonstrated long segment irregularity, with variations in the diameter extending from beyond the bifurcation to the horizontal petrous segment. The irregularity was not only spatial but variations in temporal appearance, as the narrowing worsen to greater than 80%, such as that demonstrated on image 5/7 angiogram 23. After 20 minutes of placing orogastric tube and giving dual antiplatelets dosage, anticipating stenting, a repeat angiogram demonstrated improvement in the appearance of the diameter, image 5/7 of angiogram 24. At this time we elected not to stent. PROCEDURE: The anesthesia team was present to provide general endotracheal tube anesthesia and for patient monitoring during the procedure. Intubation was performed in negative pressure [place] in neuro IR holding. Interventional neuro radiology nursing staff was also present. Ultrasound survey of the right inguinal region was performed with images stored and sent to PACs. 11 blade scalpel was used to make a small incision. Blunt dissection was performed with US guidance. A micropuncture needle was used access the right common femoral artery under ultrasound. With excellent arterial blood flow returned, an .018 micro wire was passed through the needle, observed to enter the abdominal aorta under fluoroscopy. The needle was removed, and a micropuncture sheath was placed over the wire. The inner dilator and wire were removed, and an 035 wire was advanced under fluoroscopy into the abdominal aorta. The sheath was removed and a 25cm 40F straight vascular sheath was placed. The dilator was removed and the sheath was flushed. Sheath was attached to pressurized and heparinized saline bag for constant forward flow. A coaxial system was then advanced over the 035 wire. This included a 95cm 087 "Walrus" balloon guide with coaxial 125cm Berenstein diagnostic catheter. This was  advanced to the  proximal descending thoracic aorta. Wire was then removed. Double flush of the catheter was performed. Catheter was then used to select the left common carotid artery. Angiogram was performed. Using roadmap technique, the catheter was advanced over a standard glide wire into left cervical ICA, with luminal position achieved of the guide catheter. Wire was removed and angiogram was performed of the left carotid artery. Initial angiogram revealed no intimal/luminal irregularity and we proceeded with placement of the balloon guide into the distal cervical ICA. The balloon guide and the parents teen catheter were advanced with a Glidewire into the distal cervical segment of the ICA. Glidewire and the Berenstein catheter were gently removed and double flush was performed. Formal angiogram was performed. Road map function was used once the occluded vessel was identified. Copious back flush was performed and the balloon catheter was attached to heparinized and pressurized saline bag for forward flow. A second coaxial system was then advanced through the balloon catheter, which included the selected intermediate catheter, microcatheter, and microwire. In this scenario, the set up included intermediate catheter, large-bore aspiration catheter, 135 cm, a Trevo Provue18 microcatheter, and 014 synchro soft wire. This system was advanced through the balloon guide catheter under the road-map function, with adequate back-flush at the rotating hemostatic valve at that back end of the balloon guide. Microcatheter and the intermediate catheter system were advanced through the terminal ICA and MCA to the level of the occlusion. The micro wire was then carefully advanced through the occluded segment. Microcatheter was then manipulated through the occluded segment and the wire was removed with saline drip at the hub. Blood was then aspirated through the hub of the microcatheter, and a gentle contrast injection was performed confirming  intraluminal position. A rotating hemostatic valve was then attached to the back end of the microcatheter, and a pressurized and heparinized saline bag was attached to the catheter. 4 x 40 solitaire device was then selected. Back flush was achieved at the rotating hemostatic valve, and then the device was gently advanced through the microcatheter to the distal end. The retriever was then unsheathed by withdrawing the microcatheter under fluoroscopy. Once the retriever was completely unsheathed, the microcatheter was carefully stripped from the delivery device. A 3 minute time interval was observed. The balloon at the balloon guide catheter was then inflated under fluoroscopy for proximal flow arrest. Constant aspiration using the proprietary engine was then performed at the intermediate catheter, as the retriever was gently and slowly withdrawn with fluoroscopic observation. Once the retriever was "corked" within the tip of the intermediate catheter, both were removed from the system. Free aspiration was confirmed at the hub of the balloon guide catheter, with free blood return confirmed. The balloon was then deflated, and a control angiogram was performed. The M1 segment remained occluded after the first pass. We then attempted a second pass. Microcatheter and the intermediate catheter system were again advanced through the terminal ICA and MCA to the level of the occlusion. The micro wire was then carefully advanced through the occluded segment. Microcatheter was then manipulated through the occluded segment and the wire was removed with saline drip at the hub. Blood was then aspirated through the hub of the microcatheter, and a gentle contrast injection was performed confirming intraluminal position. A rotating hemostatic valve was then attached to the back end of the microcatheter, and a pressurized and heparinized saline bag was attached to the catheter. 4 x 40 solitaire device was again used. Back flush was  achieved at the rotating hemostatic valve, and then the device was gently advanced through the microcatheter to the distal end. The retriever was then unsheathed by withdrawing the microcatheter under fluoroscopy. Once the retriever was completely unsheathed, the microcatheter was carefully stripped from the delivery device. Control angiogram was performed from the intermediate catheter, demonstrating that there was no flow through the open solitaire. A 3 minute time interval was observed. The balloon at the balloon guide catheter was then inflated under fluoroscopy for proximal flow arrest. Aspiration engine was turned on. Intermediate catheter was gently advanced over the proximal solitaire to the level of the occlusion, with flow arrest. Constant aspiration using the proprietary engine was then performed at the intermediate catheter, as both the retriever and the intermediate catheter were gently and slowly withdrawn with fluoroscopic observation. Once the retriever was "corked" within the tip of the intermediate catheter, both were removed from the system. Free aspiration was confirmed at the hub of the balloon guide catheter, with free blood return confirmed. The balloon was then deflated, and a control angiogram was performed. Restoration of left MCA flow was confirmed. The initial left A2 segment occlusion was observed to have migrated distally to the pericallosal artery. Given the patient's young age we decided to proceed with attempt at mechanical thrombectomy of this ACA embolus. ACA thrombectomy attempt: The walrus balloon catheter remained in the distal cervical segment, proximal to the horizontal petrous segment. We advanced a coaxial wire and catheter system which included 014 synchro soft wire, Zoom 35 intermediate catheter, and tree though pro view 18 microcatheter. This was advanced through the cavernous segment to the supraclinoid segment. This catheter and wire combination failed to engage the  angulated A1 origin. We then attempted a coaxial system which included CT 5 intermediate catheter, 150cm phenom 21 microcatheter, and synchro soft wire. This coaxial system was advanced through the cavernous segment, supraclinoid segment, and failed to engage the A1 segment. Various micro wires were then used including Aristotle wire, and double angle headliner wire. The 014 headliner wire was ultimately successful engaging the A1 segment, with a pericallosal position achieved. The intermediate catheter would not make the nearly 180 degree turn into the A1 segment. After several attempts at achieving n/a 1 position with the intermediate catheter, we decided that we could attempt to use an anchoring technique by deploying stent tree verb across the embolism, and then bringing the intermediate catheter over the microcatheter and stent deployed stent tree verb. This would require, however, passing through the embolus with the microcatheter to deployed the stent tree verge. The microcatheter and microwire combination would not pass gently through the thromboembolism within the pericallosal artery, with quite a dense character observed when the wire and microcatheter were at the face of the thrombus. Ultimately we decided to withdraw from this attempt given the difficulty, and the apparent reactivity of the patient's carotid artery as we observed spasm and irregularity at the skull base on each subsequent control angiogram. The microcatheter was withdrawn to the cavernous segment of the ICA. Wire was removed. Exchange length floppy tip transcend wire was then placed through the microcatheter into a safe position. The balloon guide catheter was then withdrawn into the common carotid artery and angiogram was performed. This revealed irregularity and narrowing in the proximal ICA, which continued with multiple variation in diameter through the cervical ICA to the distal segment at the site of the prior inflated balloon.  These findings were discussed with the neurology team, with concern for dissection,  as there was irregularity at this segment on the preprocedural CT scan, indicating possible vasculitis/dissection as not only the current imaging diagnosis, but potentially site of the thromboembolism origin. At this point we performed flat panel CT scan to observe for ICH/S AH, as treatment with a stent would require dual anti-platelet therapy. Flat panel CT demonstrated thin subarachnoid hemorrhage in the inter hemispheric fissure. Interval angiogram of the ICA was performed to determine the need for any possible stenting. 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 This imaging finding was concerning for progression of a possible intramural hematoma/dissection and at this point we elected to move forward with stenting. At least 20 minutes were required to place an orogastric tube and then deliver our prescribed dose of aspirin and Plavix. Once these events had occurred, repeat angiogram was performed. Interestingly this image revealed significant improvement at the cervical segment, with residual irregularity (image XA #24). At this point we interpreted this as either improving vaso spasm or improving sequela of vasculitis/dissection. Given the flat panel head CT findings and the desire to avoid dual anti-platelet therapy long-term, we elected to withdraw from the case at this point with no stent deployed. Balloon guide was then removed, with all catheters and wires removed. The skin at the puncture site was then cleaned with Chlorhexidine. The 8 French sheath was removed and an 86F angioseal was deployed. Patient tolerated the procedure well and remained hemodynamically stable throughout. Blood loss estimated at 150 cc. IMPRESSION: Status post ultrasound guided access right common femoral artery for cervical/cerebral angiogram and mechanical thrombectomy of left  MCA restoring complete flow to the left MCA territory, and failed attempt at revascularization of left pericallosal thromboembolism. Flat panel CT at the completion demonstrates thin subarachnoid hemorrhage in the anterior inter hemispheric fissure. Angio-Seal deployed for hemostasis. Signed, Gail Montgomery. Gail Montgomery, RPVI [facility] Radiology PLAN: The patient will remain intubated, as she remains COVID status unknown ICU status Target systolic blood pressure of 140-160 Right hip straight time 6 hours Frequent neurovascular checks Repeat neurologic imaging with CT and/MRI at the discretion of neurology team Electronically Signed   By: Gail Montgomery D.O.   On: 05/21/2020 08:57   DG Chest Port 1 View  Result Date: 05/31/2020 CLINICAL DATA:  Sob,code stroke ,COVID positive EXAM: PORTABLE CHEST 1 VIEW COMPARISON:  Chest radiograph 05/28/2020 FINDINGS: Stable cardiomediastinal contours with enlarged heart size. Minimal opacities at the right lung base likely reflecting atelectasis. Left lung is clear. No pneumothorax or significant pleural effusion. No acute finding in the visualized skeleton. IMPRESSION: Minimal opacities at the right lung base likely reflecting atelectasis. Electronically Signed   By: Gail Montgomery M.D.   On: 05/31/2020 09:08   DG CHEST PORT 1 VIEW  Result Date: 05/28/2020 CLINICAL DATA:  Fever.  COVID positive. EXAM: PORTABLE CHEST 1 VIEW COMPARISON:  05/22/2020 FINDINGS: Midline trachea.  Normal heart size and mediastinal contours. Sharp costophrenic angles. No pneumothorax. Clear lungs. Mild right hemidiaphragm elevation. IMPRESSION: No active disease. Electronically Signed   By: Jeronimo Greaves M.D.   On: 05/28/2020 08:14   DG CHEST PORT 1 VIEW  Result Date: 05/22/2020 CLINICAL DATA:  COVID-19 positive. EXAM: PORTABLE CHEST 1 VIEW COMPARISON:  May 20, 2020. FINDINGS: Stable cardiomediastinal silhouette. Endotracheal and nasogastric tubes  have been removed. No pneumothorax or pleural effusion is noted. Lungs are clear. Bony thorax is unremarkable. IMPRESSION: No active disease. Electronically Signed  By: Lupita Raider M.D.   On: 05/22/2020 08:36   DG CHEST PORT 1 VIEW  Result Date: 05/20/2020 CLINICAL DATA:  CABG 19 positive, respiratory failure EXAM: PORTABLE CHEST 1 VIEW COMPARISON:  05/19/2020 chest radiograph. FINDINGS: Endotracheal tube tip is 4.1 cm above the carina. Enteric tube terminates in the gastric fundus. Stable cardiomediastinal silhouette with normal heart size. No pneumothorax. No pleural effusion. Mild platelike atelectasis in the mid to lower lungs. No pulmonary edema. IMPRESSION: 1. Well-positioned support structures. 2. Mild platelike atelectasis in the mid to lower lungs. Electronically Signed   By: Gail Montgomery M.D.   On: 05/20/2020 09:09   DG CHEST PORT 1 VIEW  Result Date: 05/19/2020 CLINICAL DATA:  LEFT MCA infarct and intracranial hemorrhage. Respiratory failure. COVID positive. EXAM: PORTABLE CHEST 1 VIEW COMPARISON:  11/07/2016 chest radiograph FINDINGS: An endotracheal tube is noted with tip 3 cm above the carina. An NG tube is noted coiled in the stomach. This is a mildly low volume film with mild bibasilar opacities which may represent atelectasis or airspace disease. No pleural effusion or pneumothorax. No acute bony abnormalities are present. IMPRESSION: 1. Endotracheal tube and NG tube placement. 2. Mildly low volume film with mild bibasilar opacities which may represent atelectasis or infection/pneumonia. Electronically Signed   By: Harmon Pier M.D.   On: 05/19/2020 18:03   DG Swallowing Func-Speech Pathology  Result Date: 05/22/2020 Objective Swallowing Evaluation: Type of Study: MBS-Modified Barium Swallow Study  Patient Details Name: Gail Montgomery MRN: [id number] Date of Birth: [birth year] Today's Date: 05/22/2020 Time: SLP Start Time (ACUTE ONLY): 1449 -SLP Stop Time (ACUTE ONLY): 1509 SLP Time  Calculation (min) (ACUTE ONLY): 20 min Past Medical History: Past Medical History: Diagnosis Date  Anxiety   Panic attack  Past Surgical History: Past Surgical History: Procedure Laterality Date  IR CT HEAD LTD  05/19/2020  IR CT HEAD LTD  05/19/2020  IR PERCUTANEOUS ART THROMBECTOMY/INFUSION INTRACRANIAL INC DIAG ANGIO  05/19/2020  IR US GUIDE VASC ACCESS RIGHT  05/19/2020  RADIOLOGY WITH ANESTHESIA N/A 05/19/2020  Procedure: IR WITH ANESTHESIA;  Surgeon: Radiologist, Medication, MD;  Location: [facility] OR;  Service: Radiology;  Laterality: N/A; HPI: Pt is a 24 yo female presenting with sudden onset R sided weakness and aphasia. 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 She was intubated for thrombectomy 8/21. 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 MRI showed early subacute infarct of the left basal ganglia involving the posterior putamen and caudate body. Pt self-extubated 8/22. Pt tested (+) for COVID-19 but was asymptomatic upon arrival. PMH: panic attack, anxiety  Subjective: alert, cooperative, aphasic Assessment / Plan / Recommendation CHL IP CLINICAL IMPRESSIONS 05/22/2020 Clinical Impression Pt presents with a mild oral dysphagia, but with relatively intact pharyngeal function. She has reduced labial seal with anterior spillage that happens with thin liquids via cup and straw, and is not necessarily localized to one side of her mouth. She has lingual pumping, with more time needed to clear solids from her oral cavity compared to liquids. Lingual pumping with solids is sometimes moderately prolonged, and she is not able to clear the barium tablet before losing it sublingually. After she is able to retrieve it, she masticated it rather than trying again to swallow it whole. There is some premature spillage with thin liquids, at times filling the valleculae and pyriform sinuses and with varying amounts of time that passes before she swallows, dependent upon how much longer it takes  her to clear her mouth. Despite this, there is no aspiration across challenging. She had a few instances of trace and transient penetration. Throat clearing was not noted with each episode of transient penetration, but was also not observed outside of these episodes. Recommend that pt begin Dys 3 (mechanical soft) diet and thin liquids.  SLP Visit Diagnosis Dysphagia, oral phase (R13.11) Attention and concentration deficit following -- Frontal lobe and executive function deficit following -- Impact on safety and function Mild aspiration risk   CHL IP TREATMENT RECOMMENDATION 05/22/2020 Treatment Recommendations Therapy as outlined in treatment plan below   Prognosis 05/22/2020 Prognosis for Safe Diet Advancement Good Barriers to Reach Goals Language deficits Barriers/Prognosis Comment -- CHL IP DIET RECOMMENDATION 05/22/2020 SLP Diet Recommendations Dysphagia 3 (Mech soft) solids;Thin liquid Liquid Administration via Straw;Cup Medication Administration Whole meds with puree Compensations Slow rate;Small sips/bites Postural Changes Seated upright at 90 degrees   CHL IP OTHER RECOMMENDATIONS 05/22/2020 Recommended Consults -- Oral Care Recommendations Oral care BID Other Recommendations --   CHL IP FOLLOW UP RECOMMENDATIONS 05/22/2020 Follow up Recommendations Inpatient Rehab   CHL IP FREQUENCY AND DURATION 05/22/2020 Speech Therapy Frequency (ACUTE ONLY) min 2x/week Treatment Duration 2 weeks      CHL IP ORAL PHASE 05/22/2020 Oral Phase Impaired Oral - Pudding Teaspoon -- Oral - Pudding Cup -- Oral - Honey Teaspoon -- Oral - Honey Cup -- Oral - Nectar Teaspoon -- Oral - Nectar Cup -- Oral - Nectar Straw -- Oral - Thin Teaspoon -- Oral - Thin Cup Left anterior bolus loss;Right anterior bolus loss;Lingual pumping;Reduced posterior propulsion;Delayed oral transit;Decreased bolus cohesion;Premature spillage Oral - Thin Straw Left anterior bolus loss;Right anterior bolus loss;Lingual pumping;Reduced posterior propulsion;Delayed  oral transit;Decreased bolus cohesion;Premature spillage Oral - Puree Left anterior bolus loss;Right anterior bolus loss;Lingual pumping;Reduced posterior propulsion;Delayed oral transit;Decreased bolus cohesion Oral - Mech Soft Left anterior bolus loss;Right anterior bolus loss;Lingual pumping;Reduced posterior propulsion;Delayed oral transit;Decreased bolus cohesion;Lingual/palatal residue Oral - Regular -- Oral - Multi-Consistency -- Oral - Pill Left anterior bolus loss;Right anterior bolus loss;Lingual pumping;Reduced posterior propulsion;Delayed oral transit;Pocketing in anterior sulcus Oral Phase - Comment --  CHL IP PHARYNGEAL PHASE 05/22/2020 Pharyngeal Phase Impaired Pharyngeal- Pudding Teaspoon -- Pharyngeal -- Pharyngeal- Pudding Cup -- Pharyngeal -- Pharyngeal- Honey Teaspoon -- Pharyngeal -- Pharyngeal- Honey Cup -- Pharyngeal -- Pharyngeal- Nectar Teaspoon -- Pharyngeal -- Pharyngeal- Nectar Cup -- Pharyngeal -- Pharyngeal- Nectar Straw -- Pharyngeal -- Pharyngeal- Thin Teaspoon -- Pharyngeal -- Pharyngeal- Thin Cup Penetration/Aspiration before swallow Pharyngeal Material enters airway, remains ABOVE vocal cords then ejected out Pharyngeal- Thin Straw Penetration/Aspiration before swallow Pharyngeal Material enters airway, remains ABOVE vocal cords then ejected out Pharyngeal- Puree WFL Pharyngeal -- Pharyngeal- Mechanical Soft WFL Pharyngeal -- Pharyngeal- Regular -- Pharyngeal -- Pharyngeal- Multi-consistency -- Pharyngeal -- Pharyngeal- Pill WFL Pharyngeal -- Pharyngeal Comment --  CHL IP CERVICAL ESOPHAGEAL PHASE 05/22/2020 Cervical Esophageal Phase WFL Pudding Teaspoon -- Pudding Cup -- Honey Teaspoon -- Honey Cup -- Nectar Teaspoon -- Nectar Cup -- Nectar Straw -- Thin Teaspoon -- Thin Cup -- Thin Straw -- Puree -- Mechanical Soft -- Regular -- Multi-consistency -- Pill -- Cervical Esophageal Comment -- Gail Montgomery., M.A. CCC-SLP Acute Rehabilitation Services Pager [phone] Office [phone]  05/22/2020, 3:28 PM              EEG adult  Result Date: 06/01/2020 Gail Quest, MD     06/01/2020  5:25 PM Patient Name: Gail Montgomery MRN: [id number] Epilepsy Attending: Charlsie Montgomery Referring Physician/Provider:  Dr. Susa Montgomery Date: 06/01/2020 Duration: 23.16 minutes Patient history: 24 year old female presented with right-sided weakness and aphasia.  MRI showed left MCA infarct.  EEG to evaluate for seizures. Level of alertness: Awake AEDs during EEG study: None Technical aspects: This EEG study was done with scalp electrodes positioned according to the 10-20 International system of electrode placement. Electrical activity was acquired at a sampling rate of 500Hz  and reviewed with a high frequency filter of 70Hz  and a low frequency filter of 1Hz . EEG data were recorded continuously and digitally stored. Description: No posterior dominant was seen.  EEG showed continuous generalized and lateralized left hemisphere 3 to 5 Hz theta-delta slowing.  2 to 3 Hz rhythmic delta slowing was also noted in left hemisphere, maximal left posterior quadrant.  Hyperventilation and photic stimulation were not performed.   Of note, study was technically difficult due to significant electrode and movement artifact. ABNORMALITY - Continuous slow, generalized and lateralized left hemisphere - Intermittent rhythmic delta slowing, left hemisphere, maximal left posterior quadrant IMPRESSION: This technically difficult study is suggestive of cortical dysfunction in left hemisphere, maximal left posterior quadrant.  Lateralized rhythmic delta activity was seen in left hemisphere which is on the ictal-interictal continuum with low potential for seizures.  Additionally, there is evidence of moderate diffuse encephalopathy, nonspecific etiology.  No seizures or epileptiform discharges were seen throughout the recording. Gail Montgomery   VAS TRANSCRANIAL DOPPLER W BUBBLES  Result Date: 05/28/2020  Transcranial Doppler with  Bubble Indications: Stroke. Comparison Study: No prior study Performing Technologist: MHA, RDMS, RVT, RDCS  Examination Guidelines: A complete evaluation includes B-mode imaging, spectral Doppler, color Doppler, and power Doppler as needed of all accessible portions of each vessel. Bilateral testing is considered an integral part of a complete examination. Limited examinations for reoccurring indications may be performed as noted.  Summary:  A vascular evaluation was performed. The left middle cerebral artery was studied. An IV was inserted into the patient's left PICC line. Verbal informed consent was obtained.  HITS (high intensity transient signals) were heard at rest, suggestive of a possible Spencer Grade 3 PFO. 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  Diagnosing physician: [place] MD Electronically signed by 05/30/2020 MD on 05/28/2020 at 1:43:29 PM.    Final    ECHOCARDIOGRAM COMPLETE  Result Date: 05/20/2020    ECHOCARDIOGRAM REPORT   Patient Name:   Gail Montgomery Date of Exam: 05/20/2020 Medical Rec #:  05/22/2020    Height:       64.0 in Accession #:    [name]   Weight:       185.6 lb Date of Birth:  07/07/1996    BSA:          1.895 m Patient Age:    24 years     BP:           127/71 mmHg Patient Gender: F            HR:           64 bpm. Exam Location:  Inpatient Procedure: 2D Echo Indications:    Stroke  History:        Patient has no prior history of Echocardiogram examinations.                 Risk Factors:Current Smoker. Covid Positive.  Sonographer:    [id number] Referring Phys: [phone] MCNEILL P 02/13/1996  IMPRESSIONS  1. Left ventricular ejection fraction, by estimation, is 60 to 65%. The left ventricle has normal function. The left ventricle has no regional wall motion abnormalities. Left ventricular diastolic parameters were normal.  2. Right ventricular systolic  function is normal. The right ventricular size is normal.  3. The mitral valve is normal in structure. Trivial mitral valve regurgitation. No evidence of mitral stenosis.  4. The aortic valve is normal in structure. Aortic valve regurgitation is not visualized. No aortic stenosis is present.  5. The inferior vena cava is normal in size with greater than 50% respiratory variability, suggesting right atrial pressure of 3 mmHg. Comparison(s): No prior Echocardiogram. Conclusion(s)/Recommendation(s): Normal biventricular function without evidence of hemodynamically significant valvular heart disease. No intracardiac source of embolism detected on this transthoracic study. A transesophageal echocardiogram is recommended to exclude cardiac source of embolism if clinically indicated. FINDINGS  Left Ventricle: Left ventricular ejection fraction, by estimation, is 60 to 65%. The left ventricle has normal function. The left ventricle has no regional wall motion abnormalities. The left ventricular internal cavity size was normal in size. There is  no left ventricular hypertrophy. Left ventricular diastolic parameters were normal. Right Ventricle: The right ventricular size is normal. No increase in right ventricular wall thickness. Right ventricular systolic function is normal. Left Atrium: Left atrial size was normal in size. Right Atrium: Right atrial size was normal in size. Pericardium: There is no evidence of pericardial effusion. Mitral Valve: The mitral valve is normal in structure. Normal mobility of the mitral valve leaflets. Trivial mitral valve regurgitation. No evidence of mitral valve stenosis. Tricuspid Valve: The tricuspid valve is normal in structure. Tricuspid valve regurgitation is trivial. No evidence of tricuspid stenosis. Aortic Valve: The aortic valve is normal in structure. Aortic valve regurgitation is not visualized. No aortic stenosis is present. Pulmonic Valve: The pulmonic valve was normal in  structure. Pulmonic valve regurgitation is not visualized. No evidence of pulmonic stenosis. Aorta: The aortic root is normal in size and structure. Venous: The inferior vena cava is normal in size with greater than 50% respiratory variability, suggesting right atrial pressure of 3 mmHg. IAS/Shunts: No atrial level shunt detected by color flow Doppler.  LEFT VENTRICLE PLAX 2D LVIDd:         4.00 cm LVIDs:         2.60 cm LV PW:         1.20 cm LV IVS:        1.00 cm LVOT diam:     1.90 cm LVOT Area:     2.84 cm  RIGHT VENTRICLE TAPSE (M-mode): 2.6 cm LEFT ATRIUM             Index       RIGHT ATRIUM           Index LA diam:        3.00 cm 1.58 cm/m  RA Area:     14.30 cm LA Vol (A2C):   52.4 ml 27.65 ml/m RA Volume:   36.50 ml  19.26 ml/m LA Vol (A4C):   38.2 ml 20.15 ml/m LA Biplane Vol: 44.9 ml 23.69 ml/m   AORTA Ao Root diam: 2.70 cm  SHUNTS Systemic Diam: 1.90 cm Gail Alexander MD Electronically signed by Gail Alexander MD Signature Date/Time: 05/20/2020/4:13:45 PM    Final    IR PERCUTANEOUS ART THROMBECTOMY/INFUSION INTRACRANIAL INC DIAG ANGIO  Result Date: 05/21/2020 INDICATION: 24 year old female with a history of acute left MCA syndrome, presents for mechanical  thrombectomy COVID test was confirmed positive after initiation of the case EXAM: ULTRASOUND-GUIDED RIGHT COMMON FEMORAL ARTERY ACCESS CERVICAL AND CEREBRAL ANGIOGRAM MECHANICAL THROMBECTOMY LEFT MCA ANGIO-SEAL FOR HEMOSTASIS COMPARISON:  CT imaging same day MEDICATIONS: 200 mcg intra arterial nitroglycerin. ANESTHESIA/SEDATION: The anesthesia team was present to provide general endotracheal tube anesthesia and for patient monitoring during the procedure. Intubation was performed in negative pressure [place] in neuro IR holding. Left radial arterial line was performed by the anesthesia team. Interventional neuro radiology nursing staff was also present. CONTRAST:  150 cc FLUOROSCOPY TIME:  Fluoroscopy Time: 53 minutes 36 seconds (1651 mGy).  COMPLICATIONS: SIR LEVEL C - Requires therapy, minor hospitalization (<48 hrs). Subarachnoid hemorrhage TECHNIQUE: Informed written consent was obtained from the patient's family after a thorough discussion of the procedural risks, benefits and alternatives. Specific risks discussed include: Bleeding, infection, contrast reaction, kidney injury/failure, need for further procedure/surgery, arterial injury or dissection, embolization to new territory, intracranial hemorrhage (10-15% risk), neurologic deterioration, cardiopulmonary collapse, death. All questions were addressed. Maximal Sterile Barrier Technique was utilized including during the procedure including caps, mask, sterile gowns, sterile gloves, sterile drape, hand hygiene and skin antiseptic. A timeout was performed prior to the initiation of the procedure. The anesthesia team was present to provide general endotracheal tube anesthesia and for patient monitoring during the procedure. Interventional neuro radiology nursing staff was also present. FINDINGS: Initial Findings: Left common carotid artery: ICA patent with no tortuosity and no significant atherosclerosis. Separate branch from the aortic arch. Left external carotid artery: Patent with antegrade flow. Left internal carotid artery: The common carotid artery is relatively straight with no beading or significant narrowing. The AP image on the initial image demonstrates perhaps a slight irregularity on the lateral intimal surface which may correspond to the coronal reformatted images on the comparison CT preprocedure (image 25 of series 9). Given that there was no flow limiting problem, we elected to proceed with the thrombectomy Left MCA: The vertical horizontal petrous segment patent. Segment lacerum patent. Cavernous and supraclinoid patent. Occlusion of the proximal M1 segment with visible filling defect compatible with the findings on the CT. Left ACA: A1 is patent. There is a nearly 180 degree  angulation of the A1 origin. Patent anterior communicating artery. There has been migration of the thromboembolism through the terminal segment into the A2 segment compared to the prior CT. Procedural findings: Baseline MCA flow: TICI 0 Left MCA: After 1 pass, there is no significant restoration of flow. Upon placement of the stent tree bronch the second attempt, and angiogram demonstrated no significant flow through the solitaire. After the second pass, complete restoration of flow through the MCA territory was achieved, TICI 3. During this time, there was further migration of the A2 embolus to the pericallosal artery, with the ultimate occlusion approximately level to the coronal suture. Given that the anatomy would not allow and navigation of the microcatheter and intermediate catheter into the segment, and given the perceived density of the ACA thromboembolism, we ultimately withdrew from further attempts thrombectomy when the wire and microcatheter would not pass through the face of the thrombus. Final angiogram demonstrated no evidence of extravasation of contrast at this level. The flat panel CT demonstrates inter hemispheric subarachnoid hemorrhage without mass effect. Angiogram of the cervical ICA upon withdrawal of the balloon catheter demonstrated long segment irregularity, with variations in the diameter extending from beyond the bifurcation to the horizontal petrous segment. The irregularity was not only spatial but variations in temporal appearance, as the narrowing  worsen to greater than 80%, such as that demonstrated on image 5/7 angiogram 23. After 20 minutes of placing orogastric tube and giving dual antiplatelets dosage, anticipating stenting, a repeat angiogram demonstrated improvement in the appearance of the diameter, image 5/7 of angiogram 24. At this time we elected not to stent. PROCEDURE: The anesthesia team was present to provide general endotracheal tube anesthesia and for patient  monitoring during the procedure. Intubation was performed in negative pressure [place] in neuro IR holding. Interventional neuro radiology nursing staff was also present. Ultrasound survey of the right inguinal region was performed with images stored and sent to PACs. 11 blade scalpel was used to make a small incision. Blunt dissection was performed with US guidance. A micropuncture needle was used access the right common femoral artery under ultrasound. With excellent arterial blood flow returned, an .018 micro wire was passed through the needle, observed to enter the abdominal aorta under fluoroscopy. The needle was removed, and a micropuncture sheath was placed over the wire. The inner dilator and wire were removed, and an 035 wire was advanced under fluoroscopy into the abdominal aorta. The sheath was removed and a 25cm 87F straight vascular sheath was placed. The dilator was removed and the sheath was flushed. Sheath was attached to pressurized and heparinized saline bag for constant forward flow. A coaxial system was then advanced over the 035 wire. This included a 95cm 087 "Walrus" balloon guide with coaxial 125cm Berenstein diagnostic catheter. This was advanced to the proximal descending thoracic aorta. Wire was then removed. Double flush of the catheter was performed. Catheter was then used to select the left common carotid artery. Angiogram was performed. Using roadmap technique, the catheter was advanced over a standard glide wire into left cervical ICA, with luminal position achieved of the guide catheter. Wire was removed and angiogram was performed of the left carotid artery. Initial angiogram revealed no intimal/luminal irregularity and we proceeded with placement of the balloon guide into the distal cervical ICA. The balloon guide and the parents teen catheter were advanced with a Glidewire into the distal cervical segment of the ICA. Glidewire and the Berenstein catheter were gently removed and double  flush was performed. Formal angiogram was performed. Road map function was used once the occluded vessel was identified. Copious back flush was performed and the balloon catheter was attached to heparinized and pressurized saline bag for forward flow. A second coaxial system was then advanced through the balloon catheter, which included the selected intermediate catheter, microcatheter, and microwire. In this scenario, the set up included intermediate catheter, large-bore aspiration catheter, 135 cm, a Trevo Provue18 microcatheter, and 014 synchro soft wire. This system was advanced through the balloon guide catheter under the road-map function, with adequate back-flush at the rotating hemostatic valve at that back end of the balloon guide. Microcatheter and the intermediate catheter system were advanced through the terminal ICA and MCA to the level of the occlusion. The micro wire was then carefully advanced through the occluded segment. Microcatheter was then manipulated through the occluded segment and the wire was removed with saline drip at the hub. Blood was then aspirated through the hub of the microcatheter, and a gentle contrast injection was performed confirming intraluminal position. A rotating hemostatic valve was then attached to the back end of the microcatheter, and a pressurized and heparinized saline bag was attached to the catheter. 4 x 40 solitaire device was then selected. Back flush was achieved at the rotating hemostatic valve, and then the device  was gently advanced through the microcatheter to the distal end. The retriever was then unsheathed by withdrawing the microcatheter under fluoroscopy. Once the retriever was completely unsheathed, the microcatheter was carefully stripped from the delivery device. A 3 minute time interval was observed. The balloon at the balloon guide catheter was then inflated under fluoroscopy for proximal flow arrest. Constant aspiration using the proprietary engine  was then performed at the intermediate catheter, as the retriever was gently and slowly withdrawn with fluoroscopic observation. Once the retriever was "corked" within the tip of the intermediate catheter, both were removed from the system. Free aspiration was confirmed at the hub of the balloon guide catheter, with free blood return confirmed. The balloon was then deflated, and a control angiogram was performed. The M1 segment remained occluded after the first pass. We then attempted a second pass. Microcatheter and the intermediate catheter system were again advanced through the terminal ICA and MCA to the level of the occlusion. The micro wire was then carefully advanced through the occluded segment. Microcatheter was then manipulated through the occluded segment and the wire was removed with saline drip at the hub. Blood was then aspirated through the hub of the microcatheter, and a gentle contrast injection was performed confirming intraluminal position. A rotating hemostatic valve was then attached to the back end of the microcatheter, and a pressurized and heparinized saline bag was attached to the catheter. 4 x 40 solitaire device was again used. Back flush was achieved at the rotating hemostatic valve, and then the device was gently advanced through the microcatheter to the distal end. The retriever was then unsheathed by withdrawing the microcatheter under fluoroscopy. Once the retriever was completely unsheathed, the microcatheter was carefully stripped from the delivery device. Control angiogram was performed from the intermediate catheter, demonstrating that there was no flow through the open solitaire. A 3 minute time interval was observed. The balloon at the balloon guide catheter was then inflated under fluoroscopy for proximal flow arrest. Aspiration engine was turned on. Intermediate catheter was gently advanced over the proximal solitaire to the level of the occlusion, with flow arrest. Constant  aspiration using the proprietary engine was then performed at the intermediate catheter, as both the retriever and the intermediate catheter were gently and slowly withdrawn with fluoroscopic observation. Once the retriever was "corked" within the tip of the intermediate catheter, both were removed from the system. Free aspiration was confirmed at the hub of the balloon guide catheter, with free blood return confirmed. The balloon was then deflated, and a control angiogram was performed. Restoration of left MCA flow was confirmed. The initial left A2 segment occlusion was observed to have migrated distally to the pericallosal artery. Given the patient's young age we decided to proceed with attempt at mechanical thrombectomy of this ACA embolus. ACA thrombectomy attempt: The walrus balloon catheter remained in the distal cervical segment, proximal to the horizontal petrous segment. We advanced a coaxial wire and catheter system which included 014 synchro soft wire, Zoom 35 intermediate catheter, and tree though pro view 18 microcatheter. This was advanced through the cavernous segment to the supraclinoid segment. This catheter and wire combination failed to engage the angulated A1 origin. We then attempted a coaxial system which included CT 5 intermediate catheter, 150cm phenom 21 microcatheter, and synchro soft wire. This coaxial system was advanced through the cavernous segment, supraclinoid segment, and failed to engage the A1 segment. Various micro wires were then used including Aristotle wire, and double angle headliner wire. The  014 headliner wire was ultimately successful engaging the A1 segment, with a pericallosal position achieved. The intermediate catheter would not make the nearly 180 degree turn into the A1 segment. After several attempts at achieving n/a 1 position with the intermediate catheter, we decided that we could attempt to use an anchoring technique by deploying stent tree verb across the  embolism, and then bringing the intermediate catheter over the microcatheter and stent deployed stent tree verb. This would require, however, passing through the embolus with the microcatheter to deployed the stent tree verge. The microcatheter and microwire combination would not pass gently through the thromboembolism within the pericallosal artery, with quite a dense character observed when the wire and microcatheter were at the face of the thrombus. Ultimately we decided to withdraw from this attempt given the difficulty, and the apparent reactivity of the patient's carotid artery as we observed spasm and irregularity at the skull base on each subsequent control angiogram. The microcatheter was withdrawn to the cavernous segment of the ICA. Wire was removed. Exchange length floppy tip transcend wire was then placed through the microcatheter into a safe position. The balloon guide catheter was then withdrawn into the common carotid artery and angiogram was performed. This revealed irregularity and narrowing in the proximal ICA, which continued with multiple variation in diameter through the cervical ICA to the distal segment at the site of the prior inflated balloon. These findings were discussed with the neurology team, with concern for dissection, as there was irregularity at this segment on the preprocedural CT scan, indicating possible vasculitis/dissection as not only the current imaging diagnosis, but potentially site of the thromboembolism origin. At this point we performed flat panel CT scan to observe for ICH/S AH, as treatment with a stent would require dual anti-platelet therapy. Flat panel CT demonstrated thin subarachnoid hemorrhage in the inter hemispheric fissure. Interval angiogram of the ICA was performed to determine the need for any possible stenting. 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 This imaging finding was  concerning for progression of a possible intramural hematoma/dissection and at this point we elected to move forward with stenting. At least 20 minutes were required to place an orogastric tube and then deliver our prescribed dose of aspirin and Plavix. Once these events had occurred, repeat angiogram was performed. Interestingly this image revealed significant improvement at the cervical segment, with residual irregularity (image XA #24). At this point we interpreted this as either improving vaso spasm or improving sequela of vasculitis/dissection. Given the flat panel head CT findings and the desire to avoid dual anti-platelet therapy long-term, we elected to withdraw from the case at this point with no stent deployed. Balloon guide was then removed, with all catheters and wires removed. The skin at the puncture site was then cleaned with Chlorhexidine. The 8 French sheath was removed and an 55F angioseal was deployed. Patient tolerated the procedure well and remained hemodynamically stable throughout. Blood loss estimated at 150 cc. IMPRESSION: Status post ultrasound guided access right common femoral artery for cervical/cerebral angiogram and mechanical thrombectomy of left MCA restoring complete flow to the left MCA territory, and failed attempt at revascularization of left pericallosal thromboembolism. Flat panel CT at the completion demonstrates thin subarachnoid hemorrhage in the anterior inter hemispheric fissure. Angio-Seal deployed for hemostasis. Signed, Gail Montgomery. Gail Montgomery, RPVI [facility] Radiology PLAN: The patient will remain intubated, as she remains COVID status unknown ICU status Target  systolic blood pressure of 140-160 Right hip straight time 6 hours Frequent neurovascular checks Repeat neurologic imaging with CT and/MRI at the discretion of neurology team Electronically Signed   By: Gail Montgomery D.O.   On: 05/21/2020 08:57   CT HEAD CODE  STROKE WO CONTRAST  Result Date: 05/19/2020 CLINICAL DATA:  Code stroke.  Stroke suspected. EXAM: CT HEAD WITHOUT CONTRAST TECHNIQUE: Contiguous axial images were obtained from the base of the skull through the vertex without intravenous contrast. COMPARISON:  None. FINDINGS: Brain: No evidence of acute infarction, hemorrhage, hydrocephalus, extra-axial collection or mass lesion/mass effect. Vascular: Dense left ICA terminus and MCA, extensive in compatible with thrombosis in this setting and assuming referable symptoms. CTA is underway. Skull: Negative Sinuses/Orbits: Negative Other: These results were communicated to Dr. Amada Jupiter at 11:25 amon 8/21/2021by text page via the [facility] messaging system. ASPECTS [facility] Stroke Program Early CT Score) - Ganglionic level infarction (caudate, lentiform nuclei, internal capsule, insula, M1-M3 cortex): 7 - Supraganglionic infarction (M4-M6 cortex): 3 Total score (0-10 with 10 being normal): 10 IMPRESSION: Dense left ICA terminus and MCA. No acute hemorrhage. ASPECTS is 10. Electronically Signed   By: Marnee Spring M.D.   On: 05/19/2020 11:27   VAS [place] LOWER EXTREMITY VENOUS (DVT)  Result Date: 05/29/2020  Lower Venous DVTStudy Indications: Fever. SARS-CoV-2 positive, and pulmonary embolism.  Comparison Study: 05/21/2020- negative bilateral lower extremity venous duplex Performing Technologist: Gail Montgomery MHA, RDMS, RVT, RDCS  Examination Guidelines: A complete evaluation includes B-mode imaging, spectral Doppler, color Doppler, and power Doppler as needed of all accessible portions of each vessel. Bilateral testing is considered an integral part of a complete examination. Limited examinations for reoccurring indications may be performed as noted. The reflux portion of the exam is performed with the patient in reverse Trendelenburg.  +---------+---------------+---------+-----------+----------+--------------+  RIGHT      Compressibility Phasicity Spontaneity Properties Thrombus Aging  +---------+---------------+---------+-----------+----------+--------------+  CFV       Full            Yes       Yes                                    +---------+---------------+---------+-----------+----------+--------------+  SFJ       Full                                                             +---------+---------------+---------+-----------+----------+--------------+  FV Prox   None                      No                     Acute           +---------+---------------+---------+-----------+----------+--------------+  FV Mid    None                                             Acute           +---------+---------------+---------+-----------+----------+--------------+  FV Distal None  Acute           +---------+---------------+---------+-----------+----------+--------------+  PFV       Full                                                             +---------+---------------+---------+-----------+----------+--------------+  POP       None                      No                     Acute           +---------+---------------+---------+-----------+----------+--------------+  PTV       Full                      Yes                                    +---------+---------------+---------+-----------+----------+--------------+  PERO      Partial                   Yes                                    +---------+---------------+---------+-----------+----------+--------------+  Gastroc   None                      No                     Acute           +---------+---------------+---------+-----------+----------+--------------+   +---------+---------------+---------+-----------+----------+--------------+  LEFT      Compressibility Phasicity Spontaneity Properties Thrombus Aging  +---------+---------------+---------+-----------+----------+--------------+  CFV       Full            Yes       Yes                                     +---------+---------------+---------+-----------+----------+--------------+  SFJ       Full                                                             +---------+---------------+---------+-----------+----------+--------------+  FV Prox   Full                                                             +---------+---------------+---------+-----------+----------+--------------+  FV Mid    Full                                                             +---------+---------------+---------+-----------+----------+--------------+  FV Distal Full                                                             +---------+---------------+---------+-----------+----------+--------------+  PFV       Full                                                             +---------+---------------+---------+-----------+----------+--------------+  POP       Full            Yes       Yes                                    +---------+---------------+---------+-----------+----------+--------------+  PTV       Full                                                             +---------+---------------+---------+-----------+----------+--------------+  PERO      Full                                                             +---------+---------------+---------+-----------+----------+--------------+     Summary: RIGHT: - Findings consistent with acute deep vein thrombosis involving the right femoral vein, right popliteal vein, and right gastrocnemius veins. - No cystic structure found in the popliteal fossa.  LEFT: - There is no evidence of deep vein thrombosis in the lower extremity.  - No cystic structure found in the popliteal fossa.  *See table(s) above for measurements and observations. Electronically signed by Coral Else MD on 05/29/2020 at 8:52:33 PM.    Final    VAS [place] LOWER EXTREMITY VENOUS (DVT)  Result Date: 05/21/2020  Lower Venous DVTStudy Indications: Stroke.  Limitations: Bandages. Comparison Study: No  prior studies. Performing Technologist: Jean Rosenthal  Examination Guidelines: A complete evaluation includes B-mode imaging, spectral Doppler, color Doppler, and power Doppler as needed of all accessible portions of each vessel. Bilateral testing is considered an integral part of a complete examination. Limited examinations for reoccurring indications may be performed as noted. The reflux portion of the exam is performed with the patient in reverse Trendelenburg.  +---------+---------------+---------+-----------+----------+-------------------+  RIGHT     Compressibility Phasicity Spontaneity Properties Thrombus Aging       +---------+---------------+---------+-----------+----------+-------------------+  CFV                                                        Unable to visualize  due to bandages      +---------+---------------+---------+-----------+----------+-------------------+  SFJ                                                        Unable to visualize                                                              due to bandages      +---------+---------------+---------+-----------+----------+-------------------+  FV Prox   Full                                                                  +---------+---------------+---------+-----------+----------+-------------------+  FV Mid    Full                                                                  +---------+---------------+---------+-----------+----------+-------------------+  FV Distal Full                                                                  +---------+---------------+---------+-----------+----------+-------------------+  PFV       Full                                                                  +---------+---------------+---------+-----------+----------+-------------------+  POP       Full            Yes       Yes                                          +---------+---------------+---------+-----------+----------+-------------------+  PTV       Full                                                                  +---------+---------------+---------+-----------+----------+-------------------+  PERO      Full                                                                  +---------+---------------+---------+-----------+----------+-------------------+   +---------+---------------+---------+-----------+----------+-------------------+  LEFT      Compressibility Phasicity Spontaneity Properties Thrombus Aging       +---------+---------------+---------+-----------+----------+-------------------+  CFV       Full            Yes       Yes                                         +---------+---------------+---------+-----------+----------+-------------------+  SFJ       Full                                                                  +---------+---------------+---------+-----------+----------+-------------------+  FV Prox   Full                                                                  +---------+---------------+---------+-----------+----------+-------------------+  FV Mid    Full            Yes       Yes                    Some segments not                                                                visualized due to                                                                bandages             +---------+---------------+---------+-----------+----------+-------------------+  FV Distal Full            Yes       Yes                                         +---------+---------------+---------+-----------+----------+-------------------+  PFV       Full                                                                  +---------+---------------+---------+-----------+----------+-------------------+  POP       Full            Yes       Yes                                         +---------+---------------+---------+-----------+----------+-------------------+  PTV       Full                                                                  +---------+---------------+---------+-----------+----------+-------------------+  PERO      Full                                                                  +---------+---------------+---------+-----------+----------+-------------------+    Summary: RIGHT: - There is no evidence of deep vein thrombosis in the lower extremity. However, portions of this examination were limited- see technologist comments above.  - No cystic structure found in the popliteal fossa.  LEFT: - There is no evidence of deep vein thrombosis in the lower extremity. However, portions of this examination were limited- see technologist comments above.  - No cystic structure found in the popliteal fossa.  *See table(s) above for measurements and observations. Electronically signed by Gail Bruns MD on 05/21/2020 at 5:11:44 PM.    Final    ECHOCARDIOGRAM LIMITED  Result Date: 05/29/2020    ECHOCARDIOGRAM LIMITED REPORT   Patient Name:   DAMEISHA TSCHIDA Date of Exam: 05/29/2020 Medical Rec #:  [id number]    Height:       64.0 in Accession #:    [id number]   Weight:       187.6 lb Date of Birth:  [birth year]    BSA:          1.904 m Patient Age:    24 years     BP:           119/77 mmHg Patient Gender: F            HR:           100 bpm. Exam Location:  Inpatient Procedure: Limited Echo Indications:    pulmonary embolism  History:        Patient has prior history of Echocardiogram examinations, most                 recent 05/20/2020. Stroke; Risk Factors:Current Smoker.  Sonographer:    Celene Skeen RDCS (AE) Referring Phys: [phone] A Gail Montgomery  Sonographer Comments: poor patient compliance (see comments). restricted mobility IMPRESSIONS  1. Left ventricular ejection fraction, by estimation, is 60 to 65%. The left ventricle has normal function. The left ventricle has no regional wall motion abnormalities.  2. Right ventricular systolic function is normal.  The right ventricular size is normal. Mildly D-shaped septum suggestive of RV pressure/volume overload.  3. The aortic valve is tricuspid. Aortic valve regurgitation is not visualized. No aortic stenosis is present.  4. The mitral valve is normal in structure. No evidence of mitral valve regurgitation. No evidence of mitral stenosis.  5. The inferior vena cava is normal in size with greater than 50% respiratory variability, suggesting right atrial pressure of 3 mmHg.  6. Limited echo. FINDINGS  Left Ventricle: Left ventricular ejection fraction, by estimation, is 60 to 65%. The left ventricle has normal function. The left ventricle has no regional wall motion  abnormalities. The left ventricular internal cavity size was normal in size. There is  no left ventricular hypertrophy. Right Ventricle: The right ventricular size is normal. No increase in right ventricular wall thickness. Right ventricular systolic function is normal. Left Atrium: Left atrial size was normal in size. Right Atrium: Right atrial size was normal in size. Pericardium: Trivial pericardial effusion is present. Mitral Valve: The mitral valve is normal in structure. No evidence of mitral valve stenosis. Aortic Valve: The aortic valve is tricuspid. Aortic valve regurgitation is not visualized. No aortic stenosis is present. Pulmonic Valve: The pulmonic valve was normal in structure. Pulmonic valve regurgitation is not visualized. Venous: The inferior vena cava is normal in size with greater than 50% respiratory variability, suggesting right atrial pressure of 3 mmHg. IAS/Shunts: No atrial level shunt detected by color flow Doppler. Marca Ancona MD Electronically signed by Marca Ancona MD Signature Date/Time: 05/29/2020/2:55:34 PM    Final    [place] EKG SITE RITE  Result Date: 05/21/2020 If [facility] image not attached, placement could not be confirmed due to current cardiac rhythm.

## 2020-06-02 NOTE — Progress Notes (Signed)
Inpatient Rehab Admissions Coordinator:   I spoke with Pt.'s mother over the phone to discuss potential CIR admission. Pt. Stated interest. Will pursue for potential admit next week, pending bed availability and insurance authorization.   Megan Salon, MS, CCC-SLP Rehab Admissions Coordinator  [phone] (celll) [phone] (office)

## 2020-06-03 LAB — COMPREHENSIVE METABOLIC PANEL
ALT: 115 U/L — ABNORMAL HIGH (ref 0–44)
AST: 96 U/L — ABNORMAL HIGH (ref 15–41)
Albumin: 2.2 g/dL — ABNORMAL LOW (ref 3.5–5.0)
Alkaline Phosphatase: 52 U/L (ref 38–126)
Anion gap: 11 (ref 5–15)
BUN: <5 mg/dL — ABNORMAL LOW (ref 6–20)
CO2: 21 mmol/L — ABNORMAL LOW (ref 22–32)
Calcium: 8.9 mg/dL (ref 8.9–10.3)
Chloride: 95 mmol/L — ABNORMAL LOW (ref 98–111)
Creatinine, Ser: 0.5 mg/dL (ref 0.44–1.00)
GFR calc Af Amer: >60 mL/min (ref >60)
GFR calc non Af Amer: >60 mL/min (ref >60)
Glucose, Bld: 108 mg/dL — ABNORMAL HIGH (ref 70–99)
Potassium: 4.1 mmol/L (ref 3.5–5.1)
Sodium: 127 mmol/L — ABNORMAL LOW (ref 135–145)
Total Bilirubin: 0.4 mg/dL (ref 0.3–1.2)
Total Protein: 7.7 g/dL (ref 6.5–8.1)

## 2020-06-03 LAB — CBC
HCT: 29.1 % — ABNORMAL LOW (ref 36.0–46.0)
Hemoglobin: 9.4 g/dL — ABNORMAL LOW (ref 12.0–15.0)
MCH: 28.1 pg (ref 26.0–34.0)
MCHC: 32.3 g/dL (ref 30.0–36.0)
MCV: 87.1 fL (ref 80.0–100.0)
Platelets: 533 10*3/uL — ABNORMAL HIGH (ref 150–400)
RBC: 3.34 MIL/uL — ABNORMAL LOW (ref 3.87–5.11)
RDW: 12 % (ref 11.5–15.5)
WBC: 8.2 10*3/uL (ref 4.0–10.5)
nRBC: 0 % (ref 0.0–0.2)

## 2020-06-03 LAB — PROCALCITONIN: Procalcitonin: <0.1 ng/mL

## 2020-06-03 LAB — BRAIN NATRIURETIC PEPTIDE: B Natriuretic Peptide: 27.3 pg/mL (ref 0.0–100.0)

## 2020-06-03 LAB — D-DIMER, QUANTITATIVE: D-Dimer, Quant: 5.86 ug/mL-FEU — ABNORMAL HIGH (ref 0.00–0.50)

## 2020-06-03 LAB — PROTIME-INR
INR: 1.2 (ref 0.8–1.2)
Prothrombin Time: 15.2 seconds (ref 11.4–15.2)

## 2020-06-03 LAB — C-REACTIVE PROTEIN: CRP: 11.5 mg/dL — ABNORMAL HIGH (ref <1.0)

## 2020-06-03 LAB — MAGNESIUM: Magnesium: 2 mg/dL (ref 1.7–2.4)

## 2020-06-03 MED ORDER — LOPERAMIDE HCL 2 MG PO CAPS
4.0000 mg | ORAL_CAPSULE | Freq: Four times a day (QID) | ORAL | Status: DC | PRN
  Administered 2020-06-03 (×2): 4 mg via ORAL
  Filled 2020-06-03 (×2): qty 2

## 2020-06-03 MED ORDER — LACTATED RINGERS IV BOLUS: 500.0000 mL | Freq: Once | INTRAVENOUS | Status: DC

## 2020-06-03 MED ORDER — LACTATED RINGERS IV SOLN: INTRAVENOUS | Status: AC

## 2020-06-03 NOTE — Progress Notes (Signed)
PROGRESS NOTE                                                                                                                                                                                                             Patient Demographics:    Gail Montgomery, is a 24 y.o. female, DOB - May 24, 1996, ZOX:[id number]  Outpatient Primary MD for the patient is No primary care provider on file.    LOS - 15  Admit date - 05/19/2020         Brief Narrative - 24 year old female normal upon waking up this morning 8/21 2021. At 09:30 she had sudden onset of right hemiparesis, and aphasia. She presents 8/21/2021to the [facility] ED as a Code Stroke  with acute left MCA syndrome.She was screened as Covid +, but was asymptomatic. She received tPA at 11:29 am , she was deemed a  candidate for thrombectomy, and was taken to IR, in IR she had successful  opening of her LMCA with reperfusion, but LACA has a residual small distal anterior cerebral clot. During procedure there was  ICA vasospasm vs dissection. She was loaded with ASA and Plavix. There was concern that she would need carotid  stenting . However upon re-imaging the vessel was open, and she did not require stent. SAH, Parenchymal hemorrhage, and intraventricular hemorrhage.   She has had persistent, intermittent fevers since 8/24. Also has had sinus tachycardia, from 110s to 130s, occasionally 140s 150s.  Tachy seems to correlate with the severity of fever mostly, she was breifly given Vanc and Zosyn in ICU on 8/25- 8/27.  Abx were stopped, she was then found to have DVT-PE and started on Hep gtt, transferred to my service on 05/31/20 on 12 of her Hospital stay. 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    Subjective:   Patient in bed appears to be in no distress, still non verbal.  Per mother she has developed some diarrhea after she drank the oral contrast for CT  scan.   Assessment  & Plan :     1. Incidental Covid +ve status with no Pulmonary symptoms - fully vaccinated with Pfizer, monitor.repeat Covid test on 05/31/2020.  Trying to remove isolation as soon as possible.   Recent Labs  Lab 05/28/20 [phone] 05/28/20 0859 05/28/20 1478  05/28/20 1225 05/29/20 0055 05/29/20 0055 05/30/20 0317 05/31/20 0450 05/31/20 0828 06/01/20 0325 06/02/20 0101 06/03/20 0326  WBC  --   --   --   --  15.0*   < > 13.7* 10.5  --  11.8* 8.3 8.2  PLT  --   --   --   --  242   < > 187 412*  --  512* 485* 533*  CRP  --   --  28.8*  --   --   --   --   --  17.3* 17.7* 13.5* 11.5*  BNP  --   --   --   --   --   --   --   --  33.8 21.4 27.5 27.3  DDIMER  --   --  12.89*  --   --   --   --   --  6.37* 5.40* 6.02* 5.86*  PROCALCITON  --   --   --    < > 0.32   < > 0.41  --  0.19 0.18 0.11 <0.10  AST  --    < >  --    < > 66*  --  82*  --   --  83* 77* 96*  ALT  --    < >  --    < > 57*  --  72*  --   --  90* 92* 115*  ALKPHOS  --    < >  --    < > 43  --  53  --   --  46 45 52  BILITOT  --    < >  --    < > 0.2*  --  0.7  --   --  0.4 0.3 0.4  ALBUMIN  --    < >  --    < > 2.0*  --  2.3*  --   --  2.2* 2.1* 2.2*  INR  --   --   --   --  1.1   < > 1.1 1.1  --  1.2 1.3* 1.2  LATICACIDVEN 0.8  --   --   --   --   --   --   --   --   --   --   --    < > = values in this interval not displayed.    2. 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 She has had full stroke work-up and neurology is following, transcranial Doppler with bubble study suggestive of possible PFO.  She also had work-up consistent with PE and DVT, she likely has paradoxical embolic infarction in the setting of DVT PE with PFO. Repeat CT on 06/01/20 shows improvement in ICH.  EEG remains nonacute, currently  on nimodipine with goal SBP of under 160, LDL greater than 78 and statin has been started by neurology. Once she is medically stable and TEE is done she can be transferred to CIR.  Discontinue use of oral contraceptives, stop smoking, weight loss per PCP.   3.  PFO.  Cardiology to conduct TEE to confirm on 06/05/2020..  4.  Right lower extremity acute DVT and bilateral acute PE.  Due to combination of hormonal contraception, obesity, some exposure to cigarette  smoking, long flight to [facility] prior to her sickness.  Anticoagulation with heparin thereafter at least 9 months of anticoagulation with outpatient hematology follow-up for hypercoagulable state work-up if needed.  5.  Persistent low-grade fevers.  Appears to be due to burden of blood clots, had evidence of E. coli UTI vs bacteriuria on 05/31/2020,  3 days of Rocephin completed, temperature curve is definitely improving after anticoagulation, CT abdomen pelvis negative, blood cultures multiple sets negative thus far.  6.  Intermittent smoking will counsel prior to discharge.  7.  Obesity BMI of 32 upon admission.  Follow-up with PCP.  8.  Sick euthyroid syndrome.  Repeat TSH in 6  weeks.   9.  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  Improved after gentle Lasix however on 06/02/2020 has developed diarrhea after CT oral contrast, gentle hydration, treat diarrhea and monitor sodium closely, may require Samsca in the future.  10.  Oral contrast for CT-induced diarrhea.  Imodium as needed.  No leukocytosis.  No abdominal pain.    Condition - Extremely Guarded  Family Communication  :  Mother Darlina Sicilian [phone] - 05/31/20 in detail, 06/01/20, 06/02/20, 06/03/20 bedside  Code Status :  Full  Consults  :  PCCM, neurology  Procedures  :    EEG - This technically difficult study is suggestive of cortical dysfunction in left hemisphere, maximal left posterior quadrant.  Lateralized rhythmic delta activity was seen in left hemisphere which is on the  ictal-interictal continuum with low potential for seizures.  Additionally, there is evidence of moderate diffuse encephalopathy, nonspecific etiology.  No seizures or epileptiform discharges were seen throughout the recording.   CTA - 1. Bilateral segmental LOWER lobe pulmonary emboli, LEFT-greater-than-RIGHT. Equivocal segmental emboli within the LEFT UPPER lobe. 2. Mild cardiomegaly.   CT HEAD CODE STROKE WO CONTRAST 05/19/2020 Dense left ICA terminus and MCA. No acute hemorrhage. ASPECTS is 10.   CT Code Stroke CTA Head W/WO contrast CT Code Stroke CTA Neck W/WO contrast 05/19/2020 1. 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  2. Mildly indistinct appearance of the proximal left ICA is likely from motion artifact, consider cervical run. No atheromatous changes or vasculopathy seen in the neck.   Neuro Interventional Radiology - Cerebral Angiogram with Intervention - Dr Loreta Ave 05/19/20 2:27 PM Left MCA: Baseline TICI 0 First Pass Device:Local aspiration and solitaire 4 x 40  ResultTICI 0 SecondPass Device:Local aspiration and solitaire 4 x 40  ResultTICI 3 Left ACA/pericallosal  Baseline TICI 0,    Final TICI 0 pericallosal artery Findings:Patent right CFA Left M1 occlusion, final is TICI 3  Left ACA A2 occlusion migrated to pericallosal artery during the case. Could not complete a first pass safely.  Significant irregularity of the cervical ICA, concerning for dissection, that resolved after observation, and once DAPT was initiated. The final appearance may represent spasm vs FMD/vasculitis  CT HEAD WO CONTRAST 05/19/2020 New large volume of subarachnoid and intraventricular hemorrhage.  CT HEAD WO CONTRAST 05/20/2020 1. Subarachnoid hemorrhage along the anterior interhemispheric fissure with slightly increased extension into both  lateral ventricles.  2. Slight increase in size of the temporal horn of the left lateral ventricle consistent with early communicating hydrocephalus.   MR BRAIN WO CONTRAST 05/20/2020 1. Early subacute infarct of the left basal ganglia involving the posterior putamen and caudate body.  2. Subarachnoid hemorrhage along the interhemispheric fissure extending into the lateral ventricles.  3. Early communicating hydrocephalus as evidenced by slightly increased size of  the temporal horns.   CT HEAD WO CONTRAST 05/21/2020 1. Stable appearance of left basal ganglia nonhemorrhagic infarction. 2. Stable medial left frontal extra-axial hemorrhage, extending into the ventricles bilaterally. 3. Subarachnoid hemorrhage posteriorly on the left and along the inter cerebral hemisphere likely represents redistribution without significant new hemorrhage. 4. Hemorrhage is present within the aqueduct of Sylvius. 5. Progressive dilation of the ventricles compatible with developing hydrocephalus. 6. No new areas of hemorrhage.   CT HEAD WO CONTRAST 05/22/2020 1. Regressed intraventricular hemorrhage since yesterday with small volume residual. Stable mild ventriculomegaly, and possible mild transependymal edema. 2. Stable left inferior frontal gyrus parasagittal hematoma (approximately 12 mL) and regional edema. No significant midline shift, basilar cisterns are patent. 3. Stable left basal ganglia infarcts. 4. No new intracranial abnormality.   CT HEAD WO CONTRAST 05/24/20 IMPRESSION: 1. 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 2. Unchanged intracranial hemorrhage with mild lateral ventriculomegaly.  ECHOCARDIOGRAM COMPLETE 05/20/2020 1. Left ventricular ejection fraction, by estimation, is 60 to 65%. The left ventricle has normal function. The left ventricle has no regional wall motion abnormalities. Left ventricular diastolic parameters were normal.   2. Right ventricular systolic function is normal.  The right ventricular size is normal.   3. The mitral valve is normal in structure. Trivial mitral valve regurgitation. No evidence of mitral stenosis.   4. The aortic valve is normal in structure. Aortic valve regurgitation is not visualized. No aortic stenosis is present.   5. The inferior vena cava is normal in size with greater than 50% respiratory variability, suggesting right atrial pressure of 3 mmHg.   Bilateral Lower Extremity Venous Dopplers  05/21/2020 RIGHT: - There is no evidence of deep vein thrombosis in the lower extremity. However, portions of this examination were limited- see technologist comments above.  - No cystic structure found in the popliteal fossa.   LEFT: - There is no evidence of deep vein thrombosis in the lower extremity. However, portions of this examination were limited- see technologist comments above.  - No cystic structure found in the popliteal fossa.    Transcranial Doppler w/ Bubble 05/25/2020 Not cooperative for Valsalva.  Bubble study showed Spencer degree 3 at rest.  Recommend TEE for further confirmation.    PUD Prophylaxis : PPI  Disposition Plan  :    Status is: Inpatient  Remains inpatient appropriate because:IV treatments appropriate due to intensity of illness or inability to take PO   Dispo: The patient is from: Home              Anticipated d/c is to: CIR              Anticipated d/c date is: > 3 days              Patient currently is not medically stable to d/c.   DVT Prophylaxis  :    Heparin    Lab Results  Component Value Date   PLT 533 (H) 06/03/2020    Diet :  Diet Order            DIET DYS 2 Room service appropriate? Yes with Assist; Fluid consistency: Thin  Diet effective now                  Inpatient Medications  Scheduled Meds: . apixaban  5 mg Oral BID  . atorvastatin  20 mg Oral Daily  . Chlorhexidine Gluconate Cloth  6 each Topical Daily  . folic acid  1 mg Oral  Daily  . mouth rinse  15 mL Mouth  Rinse BID  . multivitamin  1 tablet Oral Daily  . niMODipine  60 mg Oral Q4H  . pantoprazole  40 mg Oral Daily  . thiamine  100 mg Oral Daily  . vitamin B-12  500 mcg Oral Daily   Continuous Infusions: . lactated ringers     PRN Meds:.acetaminophen **OR** [DISCONTINUED] acetaminophen (TYLENOL) oral liquid 160 mg/5 mL **OR** [DISCONTINUED] acetaminophen, docusate, loperamide, polyethylene glycol, sodium chloride flush  Antibiotics  :    Anti-infectives (From admission, onward)   Start     Dose/Rate Route Frequency Ordered Stop   05/31/20 1215  cefTRIAXone (ROCEPHIN) 1 g in sodium chloride 0.9 % 100 mL IVPB        1 g 200 mL/hr over 30 Minutes Intravenous Every 24 hours 05/31/20 1205 06/02/20 1230   05/29/20 0000  vancomycin (VANCOCIN) IVPB 1000 mg/200 mL premix  Status:  Discontinued        1,000 mg 200 mL/hr over 60 Minutes Intravenous Every 8 hours 05/28/20 1543 05/28/20 1557   05/28/20 1545  ceFEPIme (MAXIPIME) 2 g in sodium chloride 0.9 % 100 mL IVPB  Status:  Discontinued        2 g 200 mL/hr over 30 Minutes Intravenous Every 8 hours 05/28/20 1541 05/28/20 1557   05/28/20 1545  vancomycin (VANCOREADY) IVPB 1500 mg/300 mL  Status:  Discontinued        1,500 mg 150 mL/hr over 120 Minutes Intravenous  Once 05/28/20 1541 05/28/20 1606   05/23/20 2200  piperacillin-tazobactam (ZOSYN) IVPB 3.375 g  Status:  Discontinued        3.375 g 12.5 mL/hr over 240 Minutes Intravenous Every 8 hours 05/23/20 2055 05/25/20 1619   05/23/20 2100  vancomycin (VANCOCIN) IVPB 1000 mg/200 mL premix  Status:  Discontinued        1,000 mg 200 mL/hr over 60 Minutes Intravenous Every 8 hours 05/23/20 2055 05/25/20 1608       Time Spent in minutes  30   Susa Raring M.D on 06/03/2020 at 9:00 AM  To page go to [email or web address] - password [email or web address]  [facility] -  Office  [phone]    See all Orders from today for further details    Objective:   Vitals:   06/02/20 1626 06/02/20 2137  06/02/20 2325 06/03/20 0750  BP: 123/77 131/75  106/64  Pulse: (!) 109 (!) 102  (!) 113  Resp: 20 18  19   Temp: 99.6 F (37.6 C) (!) 100.7 F (38.2 C) 98.9 F (37.2 C) 99.7 F (37.6 C)  TempSrc:  Oral    SpO2: 100% 98%  98%  Weight:      Height:        Wt Readings from Last 3 Encounters:  05/29/20 85.1 kg  11/07/16 70.3 kg (86 %, Z= 1.08)*   * Growth percentiles are based on CDC (Girls, 2-20 Years) data.     Intake/Output Summary (Last 24 hours) at 06/03/2020 0900 Last data filed at 06/03/2020 0100 Gross per 24 hour  Intake --  Output 1450 ml  Net -1450 ml     Physical Exam  Awake Alert, Non verbal, R leg >> weaker than R Arm,  [place].AT,PERRAL Supple Neck,No JVD, No cervical lymphadenopathy appriciated.  Symmetrical Chest wall movement, Good air movement bilaterally, CTAB RRR,No Gallops, Rubs or new Murmurs, No Parasternal Heave +ve B.Sounds, Abd Soft, No tenderness, No organomegaly appriciated, No rebound - guarding or rigidity. No Cyanosis,  Clubbing or edema, No new Rash or bruise    Data Review:    CBC Recent Labs  Lab 05/30/20 0317 05/31/20 0450 06/01/20 0325 06/02/20 0101 06/03/20 0326  WBC 13.7* 10.5 11.8* 8.3 8.2  HGB 10.4* 9.8* 9.8* 8.9* 9.4*  HCT 32.7* 30.5* 29.5* 27.4* 29.1*  PLT 187 412* 512* 485* 533*  MCV 89.1 87.6 87.8 86.7 87.1  MCH 28.3 28.2 29.2 28.2 28.1  MCHC 31.8 32.1 33.2 32.5 32.3  RDW 12.2 12.1 12.1 12.0 12.0    Recent Labs  Lab 05/28/20 0438 05/28/20 0859 05/28/20 0947 05/28/20 1225 05/29/20 0055 05/29/20 0055 05/30/20 0317 05/31/20 0450 05/31/20 0828 06/01/20 0325 06/02/20 0101 06/03/20 0326  NA  --    < >  --    < > 130*   < > 130*  --  129* 131* 126* 127*  K  --    < >  --    < > 3.8   < > 4.1  --  3.7 4.3 3.5 4.1  CL  --    < >  --    < > 98   < > 98  --  99 97* 96* 95*  CO2  --    < >  --    < > 21*   < > 16*  --  19* 21* 19* 21*  GLUCOSE  --    < >  --    < > 104*   < > 94  --  102* 97 119* 108*  BUN  --    < >   --    < > 9   < > 13  --  9 7 7  <5*  CREATININE  --    < >  --    < > 0.57   < > 0.57  --  0.51 0.58 0.54 0.50  CALCIUM  --    < >  --    < > 8.4*   < > 8.5*  --  8.4* 8.8* 8.6* 8.9  AST  --    < >  --    < > 66*  --  82*  --   --  83* 77* 96*  ALT  --    < >  --    < > 57*  --  72*  --   --  90* 92* 115*  ALKPHOS  --    < >  --    < > 43  --  53  --   --  46 45 52  BILITOT  --    < >  --    < > 0.2*  --  0.7  --   --  0.4 0.3 0.4  ALBUMIN  --    < >  --    < > 2.0*  --  2.3*  --   --  2.2* 2.1* 2.2*  MG  --   --   --   --  2.1  --   --   --  2.0 2.0 1.8 2.0  CRP  --   --  28.8*  --   --   --   --   --  17.3* 17.7* 13.5* 11.5*  DDIMER  --   --  12.89*  --   --   --   --   --  6.37* 5.40* 6.02* 5.86*  PROCALCITON  --   --   --    < >  0.32   < > 0.41  --  0.19 0.18 0.11 <0.10  LATICACIDVEN 0.8  --   --   --   --   --   --   --   --   --   --   --   INR  --   --   --   --  1.1   < > 1.1 1.1  --  1.2 1.3* 1.2  BNP  --   --   --   --   --   --   --   --  33.8 21.4 27.5 27.3   < > = values in this interval not displayed.    ------------------------------------------------------------------------------------------------------------------ No results for input(s): CHOL, HDL, LDLCALC, TRIG, CHOLHDL, LDLDIRECT in the last 72 hours.  Lab Results  Component Value Date   HGBA1C 5.6 05/20/2020   ------------------------------------------------------------------------------------------------------------------ No results for input(s): TSH, T4TOTAL, T3FREE, THYROIDAB in the last 72 hours.  Invalid input(s): FREET3  Cardiac Enzymes No results for input(s): CKMB, TROPONINI, MYOGLOBIN in the last 168 hours.  Invalid input(s): CK ------------------------------------------------------------------------------------------------------------------    Component Value Date/Time   BNP 27.3 06/03/2020 0326    Micro Results Recent Results (from the past 240 hour(s))  Culture, blood (routine x 2)      Status: None   Collection Time: 05/28/20  4:39 AM   Specimen: BLOOD RIGHT HAND  Result Value Ref Range Status   Specimen Description BLOOD RIGHT HAND  Final   Special Requests   Final    BOTTLES DRAWN AEROBIC ONLY Blood Culture results may not be optimal due to an inadequate volume of blood received in culture bottles   Culture   Final    NO GROWTH 5 DAYS Performed at [facility], [address] [place], [place] [address]    Report Status 06/02/2020 FINAL  Final  Culture, blood (routine x 2)     Status: None   Collection Time: 05/28/20  4:39 AM   Specimen: BLOOD  Result Value Ref Range Status   Specimen Description BLOOD RIGHT ARM  Final   Special Requests   Final    BOTTLES DRAWN AEROBIC ONLY Blood Culture results may not be optimal due to an inadequate volume of blood received in culture bottles   Culture   Final    NO GROWTH 5 DAYS Performed at [facility], [address], [place], [place] [address]    Report Status 06/02/2020 FINAL  Final  Culture, Urine     Status: Abnormal   Collection Time: 05/28/20  1:51 PM   Specimen: Urine, Random  Result Value Ref Range Status   Specimen Description URINE, RANDOM  Final   Special Requests   Final    NONE Performed at [facility], [address], [place], [place] [address]    Culture >=100,000 COLONIES/mL ESCHERICHIA COLI (A)  Final   Report Status 05/30/2020 FINAL  Final   Organism ID, Bacteria ESCHERICHIA COLI (A)  Final      Susceptibility   Escherichia coli - MIC*    AMPICILLIN >=32 RESISTANT Resistant     CEFAZOLIN 16 SENSITIVE Sensitive     CEFTRIAXONE <=0.25 SENSITIVE Sensitive     CIPROFLOXACIN <=0.25 SENSITIVE Sensitive     GENTAMICIN >=16 RESISTANT Resistant     IMIPENEM <=0.25 SENSITIVE Sensitive     NITROFURANTOIN <=16 SENSITIVE Sensitive     TRIMETH/SULFA <=20 SENSITIVE Sensitive     AMPICILLIN/SULBACTAM >=32 RESISTANT Resistant  PIP/TAZO <=4 SENSITIVE Sensitive     * >=100,000  COLONIES/mL ESCHERICHIA COLI    Radiology Reports CT Code Stroke CTA Head W/WO contrast  Result Date: 05/19/2020 CLINICAL DATA:  Stroke suspected. EXAM: CT ANGIOGRAPHY HEAD AND NECK TECHNIQUE: Multidetector CT imaging of the head and neck was performed using the standard protocol during bolus administration of intravenous contrast. Multiplanar CT image reconstructions and MIPs were obtained to evaluate the vascular anatomy. Carotid stenosis measurements (when applicable) are obtained utilizing NASCET criteria, using the distal internal carotid diameter as the denominator. CONTRAST:  Dose is currently not known COMPARISON:  Head CT reported separately FINDINGS: CTA NECK FINDINGS Aortic arch: Negative.  Three vessel branching Right carotid system: No atheromatous changes, dissection, or beading. Left carotid system: Mild motion artifact which could account for the mildly smudged appearance of the left ICA proximally. Vertebral arteries: No proximal subclavian stenosis. Codominant vertebral arteries that are smooth and widely patent to the dura. Skeleton: No acute or aggressive finding Other neck: Negative Upper chest: Negative Review of the MIP images confirms the above findings CTA HEAD FINDINGS Anterior circulation: Cut off at the left ICA terminus with visible luminal clot. There is continuation of non opacification throughout the left M1 segment which is clot by CT. Non opacification at the left A1 origin. No contralateral branch occlusion is seen. Intravascular enhancement seen in the left operculum branches on the delayed phase. Posterior circulation: The vertebral and basilar arteries are smooth and widely patent. No branch occlusion, beading, or aneurysm. Venous sinuses: Patent Anatomic variants: None significant Review of the MIP images confirms the above findings These results were called by telephone at the time of interpretation on 05/19/2020 at 11:35 am to provider [facility] , who is  already aware. IMPRESSION: 1. 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 2. Mildly indistinct appearance of the proximal left ICA is likely from motion artifact, consider cervical run. No atheromatous changes or vasculopathy seen in the neck. Electronically Signed   By: Marnee Spring M.D.   On: 05/19/2020 11:40   CT Head Wo Contrast  Result Date: 06/01/2020 CLINICAL DATA:  Left ICA and MCA occlusion 05/19/2020 with subsequent intervention and subarachnoid hemorrhage. EXAM: CT HEAD WITHOUT CONTRAST TECHNIQUE: Contiguous axial images were obtained from the base of the skull through the vertex without intravenous contrast. COMPARISON:  CT 05/29/2020. Multiple previous imaging studies as distant as 05/19/2020. FINDINGS: Brain: Brainstem and cerebellum remain normal. Focal hematoma in the anterior interhemispheric fissure continues to become less dense. Infarction in the left cingulate gyrus and left posterior putamen and caudate nucleus as seen previously. Question some infarction also of the left temporal lobe. No hydrocephalus. No subdural collection. Vascular: No new vascular finding. Skull: Negative Sinuses/Orbits: Clear except for near total opacification of the left sphenoid sinus. Orbits negative. Other: None IMPRESSION: 1. Focal hematoma in the anterior interhemispheric fissure continues to become less dense. 2. Infarction in the left cingulate gyrus and left posterior putamen and caudate nucleus as seen previously. Question some infarction also of the left temporal lobe. Electronically Signed   By: Paulina Fusi M.D.   On: 06/01/2020 11:14   CT Head Wo Contrast  Result Date: 05/29/2020 CLINICAL DATA:  Intracranial hemorrhage follow-up. EXAM: CT HEAD WITHOUT CONTRAST TECHNIQUE: Contiguous axial images were obtained from the base of the skull through the vertex without intravenous contrast. COMPARISON:  CT head 05/24/2020 FINDINGS: Brain: Interval decrease in the  size of hyperdense anterior interhemispheric hemorrhage,  which is both intraparenchymal and extra-axial. Interval decrease in small volume intraventricular hemorrhage that is layering in the occipital horns bilaterally. No new areas of hemorrhage. Mildly decreased edema associated with a left parafalcine frontal ACA branch territory infarct. Similar edema associated with a left lateral lenticulostriate MCA territory infarct. No new areas of acute large vascular territory infarct. Slight decrease in the size of the temporal horns lateral ventricles, which are mildly rounded. No new mass effect. No substantial midline shift. MIld suprasellar cistern effacement. Vascular: No interval findings. Skull: Negative. Sinuses/Orbits: Left sphenoid opacification with frothy secretions. IMPRESSION: 1. Interval decrease in the size/conspicuity of intraparenchymal, extra-axial, and intraventricular hemorrhage, as above. No new acute hemorrhage. 2. Redemonstrated subacute left ACA and left MCA territory infarcts. No progressive mass effect. 3. Slightly improved mild ventriculomegaly. Electronically Signed   By: Feliberto Harts MD   On: 05/29/2020 08:26   CT HEAD WO CONTRAST  Result Date: 05/24/2020 CLINICAL DATA:  Stroke follow-up.  COVID positive EXAM: CT HEAD WITHOUT CONTRAST TECHNIQUE: Contiguous axial images were obtained from the base of the skull through the vertex without intravenous contrast. COMPARISON:  Yesterday FINDINGS: Brain: Anterior interhemispheric hemorrhage (subarachnoid and also parenchymal) with adjacent left ACA branch infarct that is pericallosal. There is also a lateral lenticulostriate infarct on the left. Intraventricular hemorrhage with lateral ventriculomegaly that measures similar to yesterday. No evidence of new hemorrhage or new infarct. Vascular: No interval finding Skull: Negative Sinuses/Orbits: Left sphenoid sinus opacification. IMPRESSION: 1. 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  2. Unchanged intracranial hemorrhage with mild lateral ventriculomegaly. Electronically Signed   By: Marnee Spring M.D.   On: 05/24/2020 07:13   CT HEAD WO CONTRAST  Result Date: 05/22/2020 CLINICAL DATA:  24 year old female status post code stroke presentation on 05/19/2020 with left ICA terminus ELVO. Status post endovascular reperfusion, subarachnoid and intra-axial hemorrhage. EXAM: CT HEAD WITHOUT CONTRAST TECHNIQUE: Contiguous axial images were obtained from the base of the skull through the vertex without intravenous contrast. COMPARISON:  Brain MRI 05/20/2020 and earlier. Head CT 05/21/2020 and earlier. FINDINGS: Brain: Decreased intraventricular hemorrhage since yesterday with small volume residual. There is mild ventriculomegaly, possible mild transependymal edema. Left inferior frontal gyrus parasagittal blood which appears to be both intra-and extra-axial encompasses 48 by 19 x 26 mm (AP by transverse by CC), and is stable since yesterday (estimated total volume 12 mL). Associated edema in the medial left inferior frontal gyrus, gyrus rectus and cingulate is stable. Stable mild regional mass effect. No midline shift. Basilar cisterns remain patent. Trace subarachnoid hemorrhage elsewhere. Cytotoxic edema redemonstrated in the left basal ganglia and stable. Stable gray-white matter differentiation throughout the brain. Vascular: No suspicious intracranial vascular hyperdensity now. Skull: No acute osseous abnormality identified. Sinuses/Orbits: Stable fluid levels in the sphenoid sinuses. Other paranasal sinuses and mastoids are stable and well pneumatized. Other: No acute orbit or scalp soft tissue finding. IMPRESSION: 1. Regressed intraventricular hemorrhage since yesterday with small volume residual. Stable mild ventriculomegaly, and possible mild transependymal edema. 2. Stable left inferior frontal gyrus parasagittal hematoma (approximately 12 mL) and regional edema. No significant midline  shift, basilar cisterns are patent. 3. Stable left basal ganglia infarcts. 4. No new intracranial abnormality. Electronically Signed   By: Odessa Fleming M.D.   On: 05/22/2020 08:40   CT HEAD WO CONTRAST  Result Date: 05/21/2020 CLINICAL DATA:  Stroke, follow-up. EXAM: CT HEAD WITHOUT CONTRAST TECHNIQUE: Contiguous axial images were obtained from the base of the skull through the vertex without  intravenous contrast. COMPARISON:  CT head without contrast 05/19/2020 and 05/20/2020. MR head without contrast 05/20/2020. FINDINGS: Brain: Left basal ganglia nonhemorrhagic infarction is again noted. Predominantly extra-axial medial left frontal hemorrhage is again noted, extending into the ventricles bilaterally. No significant change in hemorrhage volume is present. Ventricles are further dilated. No new areas of hemorrhage are present. Additional subarachnoid hemorrhage is now seen posteriorly on the left and along the inter cerebral hemisphere. Hemorrhage is present within the aqueduct of Sylvius. Vascular: No hyperdense vessel or unexpected calcification. Skull: Calvarium is intact. No focal lytic or blastic lesions are present. No significant extracranial soft tissue lesion is present. Sinuses/Orbits: Fluid is present in the sphenoid sinuses bilaterally and in the nasopharynx. The paranasal sinuses and mastoid air cells are otherwise clear. The globes and orbits are within normal limits. IMPRESSION: 1. Stable appearance of left basal ganglia nonhemorrhagic infarction. 2. Stable medial left frontal extra-axial hemorrhage, extending into the ventricles bilaterally. 3. Subarachnoid hemorrhage posteriorly on the left and along the inter cerebral hemisphere likely represents redistribution without significant new hemorrhage. 4. Hemorrhage is present within the aqueduct of Sylvius. 5. Progressive dilation of the ventricles compatible with developing hydrocephalus. 6. No new areas of hemorrhage. Electronically Signed   By:  Marin Roberts M.D.   On: 05/21/2020 06:39   CT HEAD WO CONTRAST  Result Date: 05/20/2020 CLINICAL DATA:  Intracranial hemorrhage follow up EXAM: CT HEAD WITHOUT CONTRAST TECHNIQUE: Contiguous axial images were obtained from the base of the skull through the vertex without intravenous contrast. COMPARISON:  05/19/2020 FINDINGS: Brain: There is subarachnoid hemorrhage again along the anterior interhemispheric fissure with extension into both lateral ventricles. The temporal horn of the left lateral ventricle has slightly increased in size. The amount of blood in the ventricles is also slightly greater. There is no herniation. Vascular: No hyperdense vessel or unexpected calcification. Skull: Normal. Negative for fracture or focal lesion. Sinuses/Orbits: No acute finding. Other: None. IMPRESSION: 1. Subarachnoid hemorrhage along the anterior interhemispheric fissure with slightly increased extension into both lateral ventricles. 2. Slight increase in size of the temporal horn of the left lateral ventricle consistent with early communicating hydrocephalus. Electronically Signed   By: Deatra Robinson M.D.   On: 05/20/2020 01:31   CT HEAD WO CONTRAST  Result Date: 05/19/2020 CLINICAL DATA:  Patient status post intervention for left internal carotid and MCA occlusion. EXAM: CT HEAD WITHOUT CONTRAST TECHNIQUE: Contiguous axial images were obtained from the base of the skull through the vertex without intravenous contrast. COMPARISON:  Head CT earlier today. FINDINGS: Brain: The patient has a new large volume of subarachnoid and intraventricular hemorrhage centered anteriorly at the circle-of-Willis. Hemorrhage extends into the fourth ventricle. There is no midline shift or hydrocephalus. Gray-white differentiation appears preserved. No mass is identified. Imaged intracranial contents appear normal. Vascular: Large vessel structures are largely opacified with contrast. Skull: Intact. Sinuses/Orbits: Negative.  Other: None. IMPRESSION: New large volume of subarachnoid and intraventricular hemorrhage. Critical Value/emergent results were called by telephone at the time of interpretation on 05/19/2020 at 3:38 pm to provider Dr. Amada Jupiter, who verbally acknowledged these results. Electronically Signed   By: Drusilla Kanner M.D.   On: 05/19/2020 15:42   CT Code Stroke CTA Neck W/WO contrast  Result Date: 05/19/2020 CLINICAL DATA:  Stroke suspected. EXAM: CT ANGIOGRAPHY HEAD AND NECK TECHNIQUE: Multidetector CT imaging of the head and neck was performed using the standard protocol during bolus administration of intravenous contrast. Multiplanar CT image reconstructions and MIPs were obtained to  evaluate the vascular anatomy. Carotid stenosis measurements (when applicable) are obtained utilizing NASCET criteria, using the distal internal carotid diameter as the denominator. CONTRAST:  Dose is currently not known COMPARISON:  Head CT reported separately FINDINGS: CTA NECK FINDINGS Aortic arch: Negative.  Three vessel branching Right carotid system: No atheromatous changes, dissection, or beading. Left carotid system: Mild motion artifact which could account for the mildly smudged appearance of the left ICA proximally. Vertebral arteries: No proximal subclavian stenosis. Codominant vertebral arteries that are smooth and widely patent to the dura. Skeleton: No acute or aggressive finding Other neck: Negative Upper chest: Negative Review of the MIP images confirms the above findings CTA HEAD FINDINGS Anterior circulation: Cut off at the left ICA terminus with visible luminal clot. There is continuation of non opacification throughout the left M1 segment which is clot by CT. Non opacification at the left A1 origin. No contralateral branch occlusion is seen. Intravascular enhancement seen in the left operculum branches on the delayed phase. Posterior circulation: The vertebral and basilar arteries are smooth and widely patent.  No branch occlusion, beading, or aneurysm. Venous sinuses: Patent Anatomic variants: None significant Review of the MIP images confirms the above findings These results were called by telephone at the time of interpretation on 05/19/2020 at 11:35 am to provider [facility] , who is already aware. IMPRESSION: 1. 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 2. Mildly indistinct appearance of the proximal left ICA is likely from motion artifact, consider cervical run. No atheromatous changes or vasculopathy seen in the neck. Electronically Signed   By: Marnee Spring M.D.   On: 05/19/2020 11:40   CT ANGIO CHEST PE W OR WO CONTRAST  Result Date: 05/28/2020 CLINICAL DATA:  24 year old female with tachycardia, COVID and positive D-dimer. EXAM: CT ANGIOGRAPHY CHEST WITH CONTRAST TECHNIQUE: Multidetector CT imaging of the chest was performed using the standard protocol during bolus administration of intravenous contrast. Multiplanar CT image reconstructions and MIPs were obtained to evaluate the vascular anatomy. CONTRAST:  60mL OMNIPAQUE IOHEXOL 350 MG/ML SOLN COMPARISON:  None. FINDINGS: Cardiovascular: This is a technically adequate study, but motion artifact does limit evaluation in some areas. Multiple pulmonary emboli are identified and within the LEFT LOWER lobe pulmonary artery, and multiple bilateral segmental LOWER lobe pulmonary arteries. Equivocal emboli within LEFT UPPER lobe segmental pulmonary arteries noted. Mild cardiomegaly is present.  RV/LV ratio equals 0.84. There is no evidence of thoracic aortic aneurysm or pericardial effusion. Mediastinum/Nodes: No enlarged mediastinal, hilar, or axillary lymph nodes. Thyroid gland, trachea, and esophagus demonstrate no significant findings. Lungs/Pleura: No airspace disease, consolidation, mass or nodule. No pleural effusion or pneumothorax. Upper Abdomen: No acute abnormality. Musculoskeletal: No chest wall  abnormality. No acute or significant osseous findings. Review of the MIP images confirms the above findings. IMPRESSION: 1. Bilateral segmental LOWER lobe pulmonary emboli, LEFT-greater-than-RIGHT. Equivocal segmental emboli within the LEFT UPPER lobe. 2. Mild cardiomegaly. Critical Value/emergent results were called by telephone at the time of interpretation on 05/28/2020 at 2:41 pm to provider Arlys John, nurse on this floor,, who verbally acknowledged these results. Electronically Signed   By: Harmon Pier M.D.   On: 05/28/2020 14:45   MR BRAIN WO CONTRAST  Result Date: 05/20/2020 CLINICAL DATA:  Sudden onset aphasia and right-sided weakness EXAM: MRI HEAD WITHOUT CONTRAST TECHNIQUE: Multiplanar, multiecho pulse sequences of the brain and surrounding structures were obtained without intravenous contrast. COMPARISON:  Head CT 05/20/2020 FINDINGS: Brain: Early subacute infarct of the  left basal ganglia involving the posterior putamen and caudate body. There is subarachnoid hemorrhage along the interhemispheric fissure extending into the lateral ventricles. There is early communicating hydrocephalus as evidenced by slightly increased size of the temporal horns. There is no herniation or other mass effect Vascular: Normal flow voids Skull and upper cervical spine: Normal Sinuses/Orbits: Normal Other: Fluid in the nasopharynx. IMPRESSION: 1. Early subacute infarct of the left basal ganglia involving the posterior putamen and caudate body. 2. Subarachnoid hemorrhage along the interhemispheric fissure extending into the lateral ventricles. 3. 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 Electronically Signed   By: Deatra Robinson M.D.   On: 05/20/2020 02:23   CT ABDOMEN PELVIS W CONTRAST  Result Date: 06/02/2020 CLINICAL DATA:  Abdominal pain and fever. Undergoing anticoagulation for bilateral pulmonary emboli. EXAM: CT ABDOMEN AND PELVIS WITH CONTRAST TECHNIQUE:  Multidetector CT imaging of the abdomen and pelvis was performed using the standard protocol following bolus administration of intravenous contrast. CONTRAST:  OMNIPAQUE IOHEXOL 300 MG/ML  SOLN COMPARISON:  Chest CTA 05/28/2020 FINDINGS: Lower chest: Bilateral lower lobe pulmonary emboli are again noted, similar to recent chest CTA, most prominent in the left lower lobe. The lung bases are clear. No significant pleural or pericardial effusion. Hepatobiliary: The liver is normal in density without suspicious focal abnormality. No evidence of gallstones, gallbladder wall thickening or biliary dilatation. Pancreas: Unremarkable. No pancreatic ductal dilatation or surrounding inflammatory changes. Spleen: Normal in size without focal abnormality. Adrenals/Urinary Tract: Both adrenal glands appear normal. The kidneys appear normal without evidence of urinary tract calculus, suspicious lesion or hydronephrosis. No bladder abnormalities are seen. Stomach/Bowel: No evidence of bowel wall thickening, distention or surrounding inflammatory change. The appendix appears normal. Vascular/Lymphatic: There are no enlarged abdominal or pelvic lymph nodes. Probable nonocclusive thrombus in the right femoral vein, best seen on coronal image 49/7. No evidence thrombus in the IVC or iliac veins. No significant arterial abnormalities. The portal, superior mesenteric and splenic veins are patent. Reproductive: The uterus and ovaries appear normal. No adnexal mass. Other: No evidence of abdominal wall mass or hernia. No ascites. Musculoskeletal: No acute or significant osseous findings. Peripherally sclerotic 8 mm lesion in the left iliac bone (image 64/3) has a nonaggressive appearance and is likely incidental. IMPRESSION: 1. No acute findings or explanation for the patient's symptoms. 2. Probable nonocclusive thrombus in the right femoral vein. Grossly stable known bilateral lower lobe pulmonary emboli, similar to recent chest  CTA. Electronically Signed   By: Carey Bullocks M.D.   On: 06/02/2020 14:28   IR CT Head Ltd  Result Date: 05/21/2020 INDICATION: 24 year old female with a history of acute left MCA syndrome, presents for mechanical thrombectomy COVID test was confirmed positive after initiation of the case EXAM: ULTRASOUND-GUIDED RIGHT COMMON FEMORAL ARTERY ACCESS CERVICAL AND CEREBRAL ANGIOGRAM MECHANICAL THROMBECTOMY LEFT MCA ANGIO-SEAL FOR HEMOSTASIS COMPARISON:  CT imaging same day MEDICATIONS: 200 mcg intra arterial nitroglycerin. ANESTHESIA/SEDATION: The anesthesia team was present to provide general endotracheal tube anesthesia and for patient monitoring during the procedure. Intubation was performed in negative pressure [place] in neuro IR holding. Left radial arterial line was performed by the anesthesia team. Interventional neuro radiology nursing staff was also present. CONTRAST:  150 cc FLUOROSCOPY TIME:  Fluoroscopy Time: 53 minutes 36 seconds (1651 mGy). COMPLICATIONS: SIR LEVEL C - Requires therapy, minor hospitalization (<48 hrs). Subarachnoid hemorrhage TECHNIQUE: Informed written consent was obtained from the patient's family after a thorough discussion of the  procedural risks, benefits and alternatives. Specific risks discussed include: Bleeding, infection, contrast reaction, kidney injury/failure, need for further procedure/surgery, arterial injury or dissection, embolization to new territory, intracranial hemorrhage (10-15% risk), neurologic deterioration, cardiopulmonary collapse, death. All questions were addressed. Maximal Sterile Barrier Technique was utilized including during the procedure including caps, mask, sterile gowns, sterile gloves, sterile drape, hand hygiene and skin antiseptic. A timeout was performed prior to the initiation of the procedure. The anesthesia team was present to provide general endotracheal tube anesthesia and for patient monitoring during the procedure. Interventional neuro  radiology nursing staff was also present. FINDINGS: Initial Findings: Left common carotid artery: ICA patent with no tortuosity and no significant atherosclerosis. Separate branch from the aortic arch. Left external carotid artery: Patent with antegrade flow. Left internal carotid artery: The common carotid artery is relatively straight with no beading or significant narrowing. The AP image on the initial image demonstrates perhaps a slight irregularity on the lateral intimal surface which may correspond to the coronal reformatted images on the comparison CT preprocedure (image 25 of series 9). Given that there was no flow limiting problem, we elected to proceed with the thrombectomy Left MCA: The vertical horizontal petrous segment patent. Segment lacerum patent. Cavernous and supraclinoid patent. Occlusion of the proximal M1 segment with visible filling defect compatible with the findings on the CT. Left ACA: A1 is patent. There is a nearly 180 degree angulation of the A1 origin. Patent anterior communicating artery. There has been migration of the thromboembolism through the terminal segment into the A2 segment compared to the prior CT. Procedural findings: Baseline MCA flow: TICI 0 Left MCA: After 1 pass, there is no significant restoration of flow. Upon placement of the stent tree bronch the second attempt, and angiogram demonstrated no significant flow through the solitaire. After the second pass, complete restoration of flow through the MCA territory was achieved, TICI 3. During this time, there was further migration of the A2 embolus to the pericallosal artery, with the ultimate occlusion approximately level to the coronal suture. Given that the anatomy would not allow and navigation of the microcatheter and intermediate catheter into the segment, and given the perceived density of the ACA thromboembolism, we ultimately withdrew from further attempts thrombectomy when the wire and microcatheter would not  pass through the face of the thrombus. Final angiogram demonstrated no evidence of extravasation of contrast at this level. The flat panel CT demonstrates inter hemispheric subarachnoid hemorrhage without mass effect. Angiogram of the cervical ICA upon withdrawal of the balloon catheter demonstrated long segment irregularity, with variations in the diameter extending from beyond the bifurcation to the horizontal petrous segment. The irregularity was not only spatial but variations in temporal appearance, as the narrowing worsen to greater than 80%, such as that demonstrated on image 5/7 angiogram 23. After 20 minutes of placing orogastric tube and giving dual antiplatelets dosage, anticipating stenting, a repeat angiogram demonstrated improvement in the appearance of the diameter, image 5/7 of angiogram 24. At this time we elected not to stent. PROCEDURE: The anesthesia team was present to provide general endotracheal tube anesthesia and for patient monitoring during the procedure. Intubation was performed in negative pressure [place] in neuro IR holding. Interventional neuro radiology nursing staff was also present. Ultrasound survey of the right inguinal region was performed with images stored and sent to PACs. 11 blade scalpel was used to make a small incision. Blunt dissection was performed with US guidance. A micropuncture needle was used access the right common femoral  artery under ultrasound. With excellent arterial blood flow returned, an .018 micro wire was passed through the needle, observed to enter the abdominal aorta under fluoroscopy. The needle was removed, and a micropuncture sheath was placed over the wire. The inner dilator and wire were removed, and an 035 wire was advanced under fluoroscopy into the abdominal aorta. The sheath was removed and a 25cm 55F straight vascular sheath was placed. The dilator was removed and the sheath was flushed. Sheath was attached to pressurized and heparinized saline bag  for constant forward flow. A coaxial system was then advanced over the 035 wire. This included a 95cm 087 "Walrus" balloon guide with coaxial 125cm Berenstein diagnostic catheter. This was advanced to the proximal descending thoracic aorta. Wire was then removed. Double flush of the catheter was performed. Catheter was then used to select the left common carotid artery. Angiogram was performed. Using roadmap technique, the catheter was advanced over a standard glide wire into left cervical ICA, with luminal position achieved of the guide catheter. Wire was removed and angiogram was performed of the left carotid artery. Initial angiogram revealed no intimal/luminal irregularity and we proceeded with placement of the balloon guide into the distal cervical ICA. The balloon guide and the parents teen catheter were advanced with a Glidewire into the distal cervical segment of the ICA. Glidewire and the Berenstein catheter were gently removed and double flush was performed. Formal angiogram was performed. Road map function was used once the occluded vessel was identified. Copious back flush was performed and the balloon catheter was attached to heparinized and pressurized saline bag for forward flow. A second coaxial system was then advanced through the balloon catheter, which included the selected intermediate catheter, microcatheter, and microwire. In this scenario, the set up included intermediate catheter, large-bore aspiration catheter, 135 cm, a Trevo Provue18 microcatheter, and 014 synchro soft wire. This system was advanced through the balloon guide catheter under the road-map function, with adequate back-flush at the rotating hemostatic valve at that back end of the balloon guide. Microcatheter and the intermediate catheter system were advanced through the terminal ICA and MCA to the level of the occlusion. The micro wire was then carefully advanced through the occluded segment. Microcatheter was then manipulated  through the occluded segment and the wire was removed with saline drip at the hub. Blood was then aspirated through the hub of the microcatheter, and a gentle contrast injection was performed confirming intraluminal position. A rotating hemostatic valve was then attached to the back end of the microcatheter, and a pressurized and heparinized saline bag was attached to the catheter. 4 x 40 solitaire device was then selected. Back flush was achieved at the rotating hemostatic valve, and then the device was gently advanced through the microcatheter to the distal end. The retriever was then unsheathed by withdrawing the microcatheter under fluoroscopy. Once the retriever was completely unsheathed, the microcatheter was carefully stripped from the delivery device. A 3 minute time interval was observed. The balloon at the balloon guide catheter was then inflated under fluoroscopy for proximal flow arrest. Constant aspiration using the proprietary engine was then performed at the intermediate catheter, as the retriever was gently and slowly withdrawn with fluoroscopic observation. Once the retriever was "corked" within the tip of the intermediate catheter, both were removed from the system. Free aspiration was confirmed at the hub of the balloon guide catheter, with free blood return confirmed. The balloon was then deflated, and a control angiogram was performed. The M1 segment remained  occluded after the first pass. We then attempted a second pass. Microcatheter and the intermediate catheter system were again advanced through the terminal ICA and MCA to the level of the occlusion. The micro wire was then carefully advanced through the occluded segment. Microcatheter was then manipulated through the occluded segment and the wire was removed with saline drip at the hub. Blood was then aspirated through the hub of the microcatheter, and a gentle contrast injection was performed confirming intraluminal position. A rotating  hemostatic valve was then attached to the back end of the microcatheter, and a pressurized and heparinized saline bag was attached to the catheter. 4 x 40 solitaire device was again used. Back flush was achieved at the rotating hemostatic valve, and then the device was gently advanced through the microcatheter to the distal end. The retriever was then unsheathed by withdrawing the microcatheter under fluoroscopy. Once the retriever was completely unsheathed, the microcatheter was carefully stripped from the delivery device. Control angiogram was performed from the intermediate catheter, demonstrating that there was no flow through the open solitaire. A 3 minute time interval was observed. The balloon at the balloon guide catheter was then inflated under fluoroscopy for proximal flow arrest. Aspiration engine was turned on. Intermediate catheter was gently advanced over the proximal solitaire to the level of the occlusion, with flow arrest. Constant aspiration using the proprietary engine was then performed at the intermediate catheter, as both the retriever and the intermediate catheter were gently and slowly withdrawn with fluoroscopic observation. Once the retriever was "corked" within the tip of the intermediate catheter, both were removed from the system. Free aspiration was confirmed at the hub of the balloon guide catheter, with free blood return confirmed. The balloon was then deflated, and a control angiogram was performed. Restoration of left MCA flow was confirmed. The initial left A2 segment occlusion was observed to have migrated distally to the pericallosal artery. Given the patient's young age we decided to proceed with attempt at mechanical thrombectomy of this ACA embolus. ACA thrombectomy attempt: The walrus balloon catheter remained in the distal cervical segment, proximal to the horizontal petrous segment. We advanced a coaxial wire and catheter system which included 014 synchro soft wire, Zoom 35  intermediate catheter, and tree though pro view 18 microcatheter. This was advanced through the cavernous segment to the supraclinoid segment. This catheter and wire combination failed to engage the angulated A1 origin. We then attempted a coaxial system which included CT 5 intermediate catheter, 150cm phenom 21 microcatheter, and synchro soft wire. This coaxial system was advanced through the cavernous segment, supraclinoid segment, and failed to engage the A1 segment. Various micro wires were then used including Aristotle wire, and double angle headliner wire. The 014 headliner wire was ultimately successful engaging the A1 segment, with a pericallosal position achieved. The intermediate catheter would not make the nearly 180 degree turn into the A1 segment. After several attempts at achieving n/a 1 position with the intermediate catheter, we decided that we could attempt to use an anchoring technique by deploying stent tree verb across the embolism, and then bringing the intermediate catheter over the microcatheter and stent deployed stent tree verb. This would require, however, passing through the embolus with the microcatheter to deployed the stent tree verge. The microcatheter and microwire combination would not pass gently through the thromboembolism within the pericallosal artery, with quite a dense character observed when the wire and microcatheter were at the face of the thrombus. Ultimately we decided to withdraw from  this attempt given the difficulty, and the apparent reactivity of the patient's carotid artery as we observed spasm and irregularity at the skull base on each subsequent control angiogram. The microcatheter was withdrawn to the cavernous segment of the ICA. Wire was removed. Exchange length floppy tip transcend wire was then placed through the microcatheter into a safe position. The balloon guide catheter was then withdrawn into the common carotid artery and angiogram was performed. This  revealed irregularity and narrowing in the proximal ICA, which continued with multiple variation in diameter through the cervical ICA to the distal segment at the site of the prior inflated balloon. These findings were discussed with the neurology team, with concern for dissection, as there was irregularity at this segment on the preprocedural CT scan, indicating possible vasculitis/dissection as not only the current imaging diagnosis, but potentially site of the thromboembolism origin. At this point we performed flat panel CT scan to observe for ICH/S AH, as treatment with a stent would require dual anti-platelet therapy. Flat panel CT demonstrated thin subarachnoid hemorrhage in the inter hemispheric fissure. Interval angiogram of the ICA was performed to determine the need for any possible stenting. 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 This imaging finding was concerning for progression of a possible intramural hematoma/dissection and at this point we elected to move forward with stenting. At least 20 minutes were required to place an orogastric tube and then deliver our prescribed dose of aspirin and Plavix. Once these events had occurred, repeat angiogram was performed. Interestingly this image revealed significant improvement at the cervical segment, with residual irregularity (image XA #24). At this point we interpreted this as either improving vaso spasm or improving sequela of vasculitis/dissection. Given the flat panel head CT findings and the desire to avoid dual anti-platelet therapy long-term, we elected to withdraw from the case at this point with no stent deployed. Balloon guide was then removed, with all catheters and wires removed. The skin at the puncture site was then cleaned with Chlorhexidine. The 8 French sheath was removed and an 9F angioseal was deployed. Patient tolerated the procedure well and remained  hemodynamically stable throughout. Blood loss estimated at 150 cc. IMPRESSION: Status post ultrasound guided access right common femoral artery for cervical/cerebral angiogram and mechanical thrombectomy of left MCA restoring complete flow to the left MCA territory, and failed attempt at revascularization of left pericallosal thromboembolism. Flat panel CT at the completion demonstrates thin subarachnoid hemorrhage in the anterior inter hemispheric fissure. Angio-Seal deployed for hemostasis. Signed, Yvone Neu. Reyne Dumas, RPVI [facility] PLAN: The patient will remain intubated, as she remains COVID status unknown ICU status Target systolic blood pressure of 140-160 Right hip straight time 6 hours Frequent neurovascular checks Repeat neurologic imaging with CT and/MRI at the discretion of neurology team Electronically Signed   By: Gilmer Mor D.O.   On: 05/21/2020 08:57   IR CT Head Ltd  Result Date: 05/21/2020 INDICATION: 24 year old female with a history of acute left MCA syndrome, presents for mechanical thrombectomy COVID test was confirmed positive after initiation of the case EXAM: ULTRASOUND-GUIDED RIGHT COMMON FEMORAL ARTERY ACCESS CERVICAL AND CEREBRAL ANGIOGRAM MECHANICAL THROMBECTOMY LEFT MCA ANGIO-SEAL FOR HEMOSTASIS COMPARISON:  CT imaging same day MEDICATIONS: 200 mcg intra arterial nitroglycerin. ANESTHESIA/SEDATION: The anesthesia team was present to provide general endotracheal tube anesthesia and for patient monitoring during the procedure. Intubation was performed in negative pressure [place] in neuro  IR holding. Left radial arterial line was performed by the anesthesia team. Interventional neuro radiology nursing staff was also present. CONTRAST:  150 cc FLUOROSCOPY TIME:  Fluoroscopy Time: 53 minutes 36 seconds (1651 mGy). COMPLICATIONS: SIR LEVEL C - Requires therapy, minor hospitalization (<48 hrs). Subarachnoid hemorrhage TECHNIQUE:  Informed written consent was obtained from the patient's family after a thorough discussion of the procedural risks, benefits and alternatives. Specific risks discussed include: Bleeding, infection, contrast reaction, kidney injury/failure, need for further procedure/surgery, arterial injury or dissection, embolization to new territory, intracranial hemorrhage (10-15% risk), neurologic deterioration, cardiopulmonary collapse, death. All questions were addressed. Maximal Sterile Barrier Technique was utilized including during the procedure including caps, mask, sterile gowns, sterile gloves, sterile drape, hand hygiene and skin antiseptic. A timeout was performed prior to the initiation of the procedure. The anesthesia team was present to provide general endotracheal tube anesthesia and for patient monitoring during the procedure. Interventional neuro radiology nursing staff was also present. FINDINGS: Initial Findings: Left common carotid artery: ICA patent with no tortuosity and no significant atherosclerosis. Separate branch from the aortic arch. Left external carotid artery: Patent with antegrade flow. Left internal carotid artery: The common carotid artery is relatively straight with no beading or significant narrowing. The AP image on the initial image demonstrates perhaps a slight irregularity on the lateral intimal surface which may correspond to the coronal reformatted images on the comparison CT preprocedure (image 25 of series 9). Given that there was no flow limiting problem, we elected to proceed with the thrombectomy Left MCA: The vertical horizontal petrous segment patent. Segment lacerum patent. Cavernous and supraclinoid patent. Occlusion of the proximal M1 segment with visible filling defect compatible with the findings on the CT. Left ACA: A1 is patent. There is a nearly 180 degree angulation of the A1 origin. Patent anterior communicating artery. There has been migration of the thromboembolism  through the terminal segment into the A2 segment compared to the prior CT. Procedural findings: Baseline MCA flow: TICI 0 Left MCA: After 1 pass, there is no significant restoration of flow. Upon placement of the stent tree bronch the second attempt, and angiogram demonstrated no significant flow through the solitaire. After the second pass, complete restoration of flow through the MCA territory was achieved, TICI 3. During this time, there was further migration of the A2 embolus to the pericallosal artery, with the ultimate occlusion approximately level to the coronal suture. Given that the anatomy would not allow and navigation of the microcatheter and intermediate catheter into the segment, and given the perceived density of the ACA thromboembolism, we ultimately withdrew from further attempts thrombectomy when the wire and microcatheter would not pass through the face of the thrombus. Final angiogram demonstrated no evidence of extravasation of contrast at this level. The flat panel CT demonstrates inter hemispheric subarachnoid hemorrhage without mass effect. Angiogram of the cervical ICA upon withdrawal of the balloon catheter demonstrated long segment irregularity, with variations in the diameter extending from beyond the bifurcation to the horizontal petrous segment. The irregularity was not only spatial but variations in temporal appearance, as the narrowing worsen to greater than 80%, such as that demonstrated on image 5/7 angiogram 23. After 20 minutes of placing orogastric tube and giving dual antiplatelets dosage, anticipating stenting, a repeat angiogram demonstrated improvement in the appearance of the diameter, image 5/7 of angiogram 24. At this time we elected not to stent. PROCEDURE: The anesthesia team was present to provide general endotracheal tube anesthesia and for patient monitoring during  the procedure. Intubation was performed in negative pressure [place] in neuro IR holding. Interventional  neuro radiology nursing staff was also present. Ultrasound survey of the right inguinal region was performed with images stored and sent to PACs. 11 blade scalpel was used to make a small incision. Blunt dissection was performed with US guidance. A micropuncture needle was used access the right common femoral artery under ultrasound. With excellent arterial blood flow returned, an .018 micro wire was passed through the needle, observed to enter the abdominal aorta under fluoroscopy. The needle was removed, and a micropuncture sheath was placed over the wire. The inner dilator and wire were removed, and an 035 wire was advanced under fluoroscopy into the abdominal aorta. The sheath was removed and a 25cm 66F straight vascular sheath was placed. The dilator was removed and the sheath was flushed. Sheath was attached to pressurized and heparinized saline bag for constant forward flow. A coaxial system was then advanced over the 035 wire. This included a 95cm 087 "Walrus" balloon guide with coaxial 125cm Berenstein diagnostic catheter. This was advanced to the proximal descending thoracic aorta. Wire was then removed. Double flush of the catheter was performed. Catheter was then used to select the left common carotid artery. Angiogram was performed. Using roadmap technique, the catheter was advanced over a standard glide wire into left cervical ICA, with luminal position achieved of the guide catheter. Wire was removed and angiogram was performed of the left carotid artery. Initial angiogram revealed no intimal/luminal irregularity and we proceeded with placement of the balloon guide into the distal cervical ICA. The balloon guide and the parents teen catheter were advanced with a Glidewire into the distal cervical segment of the ICA. Glidewire and the Berenstein catheter were gently removed and double flush was performed. Formal angiogram was performed. Road map function was used once the occluded vessel was identified.  Copious back flush was performed and the balloon catheter was attached to heparinized and pressurized saline bag for forward flow. A second coaxial system was then advanced through the balloon catheter, which included the selected intermediate catheter, microcatheter, and microwire. In this scenario, the set up included intermediate catheter, large-bore aspiration catheter, 135 cm, a Trevo Provue18 microcatheter, and 014 synchro soft wire. This system was advanced through the balloon guide catheter under the road-map function, with adequate back-flush at the rotating hemostatic valve at that back end of the balloon guide. Microcatheter and the intermediate catheter system were advanced through the terminal ICA and MCA to the level of the occlusion. The micro wire was then carefully advanced through the occluded segment. Microcatheter was then manipulated through the occluded segment and the wire was removed with saline drip at the hub. Blood was then aspirated through the hub of the microcatheter, and a gentle contrast injection was performed confirming intraluminal position. A rotating hemostatic valve was then attached to the back end of the microcatheter, and a pressurized and heparinized saline bag was attached to the catheter. 4 x 40 solitaire device was then selected. Back flush was achieved at the rotating hemostatic valve, and then the device was gently advanced through the microcatheter to the distal end. The retriever was then unsheathed by withdrawing the microcatheter under fluoroscopy. Once the retriever was completely unsheathed, the microcatheter was carefully stripped from the delivery device. A 3 minute time interval was observed. The balloon at the balloon guide catheter was then inflated under fluoroscopy for proximal flow arrest. Constant aspiration using the proprietary engine was then performed at  the intermediate catheter, as the retriever was gently and slowly withdrawn with fluoroscopic  observation. Once the retriever was "corked" within the tip of the intermediate catheter, both were removed from the system. Free aspiration was confirmed at the hub of the balloon guide catheter, with free blood return confirmed. The balloon was then deflated, and a control angiogram was performed. The M1 segment remained occluded after the first pass. We then attempted a second pass. Microcatheter and the intermediate catheter system were again advanced through the terminal ICA and MCA to the level of the occlusion. The micro wire was then carefully advanced through the occluded segment. Microcatheter was then manipulated through the occluded segment and the wire was removed with saline drip at the hub. Blood was then aspirated through the hub of the microcatheter, and a gentle contrast injection was performed confirming intraluminal position. A rotating hemostatic valve was then attached to the back end of the microcatheter, and a pressurized and heparinized saline bag was attached to the catheter. 4 x 40 solitaire device was again used. Back flush was achieved at the rotating hemostatic valve, and then the device was gently advanced through the microcatheter to the distal end. The retriever was then unsheathed by withdrawing the microcatheter under fluoroscopy. Once the retriever was completely unsheathed, the microcatheter was carefully stripped from the delivery device. Control angiogram was performed from the intermediate catheter, demonstrating that there was no flow through the open solitaire. A 3 minute time interval was observed. The balloon at the balloon guide catheter was then inflated under fluoroscopy for proximal flow arrest. Aspiration engine was turned on. Intermediate catheter was gently advanced over the proximal solitaire to the level of the occlusion, with flow arrest. Constant aspiration using the proprietary engine was then performed at the intermediate catheter, as both the retriever and  the intermediate catheter were gently and slowly withdrawn with fluoroscopic observation. Once the retriever was "corked" within the tip of the intermediate catheter, both were removed from the system. Free aspiration was confirmed at the hub of the balloon guide catheter, with free blood return confirmed. The balloon was then deflated, and a control angiogram was performed. Restoration of left MCA flow was confirmed. The initial left A2 segment occlusion was observed to have migrated distally to the pericallosal artery. Given the patient's young age we decided to proceed with attempt at mechanical thrombectomy of this ACA embolus. ACA thrombectomy attempt: The walrus balloon catheter remained in the distal cervical segment, proximal to the horizontal petrous segment. We advanced a coaxial wire and catheter system which included 014 synchro soft wire, Zoom 35 intermediate catheter, and tree though pro view 18 microcatheter. This was advanced through the cavernous segment to the supraclinoid segment. This catheter and wire combination failed to engage the angulated A1 origin. We then attempted a coaxial system which included CT 5 intermediate catheter, 150cm phenom 21 microcatheter, and synchro soft wire. This coaxial system was advanced through the cavernous segment, supraclinoid segment, and failed to engage the A1 segment. Various micro wires were then used including Aristotle wire, and double angle headliner wire. The 014 headliner wire was ultimately successful engaging the A1 segment, with a pericallosal position achieved. The intermediate catheter would not make the nearly 180 degree turn into the A1 segment. After several attempts at achieving n/a 1 position with the intermediate catheter, we decided that we could attempt to use an anchoring technique by deploying stent tree verb across the embolism, and then bringing the intermediate catheter over the  microcatheter and stent deployed stent tree verb. This  would require, however, passing through the embolus with the microcatheter to deployed the stent tree verge. The microcatheter and microwire combination would not pass gently through the thromboembolism within the pericallosal artery, with quite a dense character observed when the wire and microcatheter were at the face of the thrombus. Ultimately we decided to withdraw from this attempt given the difficulty, and the apparent reactivity of the patient's carotid artery as we observed spasm and irregularity at the skull base on each subsequent control angiogram. The microcatheter was withdrawn to the cavernous segment of the ICA. Wire was removed. Exchange length floppy tip transcend wire was then placed through the microcatheter into a safe position. The balloon guide catheter was then withdrawn into the common carotid artery and angiogram was performed. This revealed irregularity and narrowing in the proximal ICA, which continued with multiple variation in diameter through the cervical ICA to the distal segment at the site of the prior inflated balloon. These findings were discussed with the neurology team, with concern for dissection, as there was irregularity at this segment on the preprocedural CT scan, indicating possible vasculitis/dissection as not only the current imaging diagnosis, but potentially site of the thromboembolism origin. At this point we performed flat panel CT scan to observe for ICH/S AH, as treatment with a stent would require dual anti-platelet therapy. Flat panel CT demonstrated thin subarachnoid hemorrhage in the inter hemispheric fissure. Interval angiogram of the ICA was performed to determine the need for any possible stenting. 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 This imaging finding was concerning for progression of a possible intramural hematoma/dissection and at this point we elected to move forward with  stenting. At least 20 minutes were required to place an orogastric tube and then deliver our prescribed dose of aspirin and Plavix. Once these events had occurred, repeat angiogram was performed. Interestingly this image revealed significant improvement at the cervical segment, with residual irregularity (image XA #24). At this point we interpreted this as either improving vaso spasm or improving sequela of vasculitis/dissection. Given the flat panel head CT findings and the desire to avoid dual anti-platelet therapy long-term, we elected to withdraw from the case at this point with no stent deployed. Balloon guide was then removed, with all catheters and wires removed. The skin at the puncture site was then cleaned with Chlorhexidine. The 8 French sheath was removed and an 60F angioseal was deployed. Patient tolerated the procedure well and remained hemodynamically stable throughout. Blood loss estimated at 150 cc. IMPRESSION: Status post ultrasound guided access right common femoral artery for cervical/cerebral angiogram and mechanical thrombectomy of left MCA restoring complete flow to the left MCA territory, and failed attempt at revascularization of left pericallosal thromboembolism. Flat panel CT at the completion demonstrates thin subarachnoid hemorrhage in the anterior inter hemispheric fissure. Angio-Seal deployed for hemostasis. Signed, Yvone Neu. Reyne Dumas, RPVI [facility] PLAN: The patient will remain intubated, as she remains COVID status unknown ICU status Target systolic blood pressure of 140-160 Right hip straight time 6 hours Frequent neurovascular checks Repeat neurologic imaging with CT and/MRI at the discretion of neurology team Electronically Signed   By: Gilmer Mor D.O.   On: 05/21/2020 08:57   IR US Guide Vasc Access Right  Result Date: 05/21/2020 INDICATION: 24 year old female with a history of acute left MCA syndrome, presents  for mechanical thrombectomy COVID  test was confirmed positive after initiation of the case EXAM: ULTRASOUND-GUIDED RIGHT COMMON FEMORAL ARTERY ACCESS CERVICAL AND CEREBRAL ANGIOGRAM MECHANICAL THROMBECTOMY LEFT MCA ANGIO-SEAL FOR HEMOSTASIS COMPARISON:  CT imaging same day MEDICATIONS: 200 mcg intra arterial nitroglycerin. ANESTHESIA/SEDATION: The anesthesia team was present to provide general endotracheal tube anesthesia and for patient monitoring during the procedure. Intubation was performed in negative pressure Bay in neuro IR holding. Left radial arterial line was performed by the anesthesia team. Interventional neuro radiology nursing staff was also present. CONTRAST:  150 cc FLUOROSCOPY TIME:  Fluoroscopy Time: 53 minutes 36 seconds (1651 mGy). COMPLICATIONS: SIR LEVEL C - Requires therapy, minor hospitalization (<48 hrs). Subarachnoid hemorrhage TECHNIQUE: Informed written consent was obtained from the patient's family after a thorough discussion of the procedural risks, benefits and alternatives. Specific risks discussed include: Bleeding, infection, contrast reaction, kidney injury/failure, need for further procedure/surgery, arterial injury or dissection, embolization to new territory, intracranial hemorrhage (10-15% risk), neurologic deterioration, cardiopulmonary collapse, death. All questions were addressed. Maximal Sterile Barrier Technique was utilized including during the procedure including caps, mask, sterile gowns, sterile gloves, sterile drape, hand hygiene and skin antiseptic. A timeout was performed prior to the initiation of the procedure. The anesthesia team was present to provide general endotracheal tube anesthesia and for patient monitoring during the procedure. Interventional neuro radiology nursing staff was also present. FINDINGS: Initial Findings: Left common carotid artery: ICA patent with no tortuosity and no significant atherosclerosis. Separate branch from the aortic arch. Left  external carotid artery: Patent with antegrade flow. Left internal carotid artery: The common carotid artery is relatively straight with no beading or significant narrowing. The AP image on the initial image demonstrates perhaps a slight irregularity on the lateral intimal surface which may correspond to the coronal reformatted images on the comparison CT preprocedure (image 25 of series 9). Given that there was no flow limiting problem, we elected to proceed with the thrombectomy Left MCA: The vertical horizontal petrous segment patent. Segment lacerum patent. Cavernous and supraclinoid patent. Occlusion of the proximal M1 segment with visible filling defect compatible with the findings on the CT. Left ACA: A1 is patent. There is a nearly 180 degree angulation of the A1 origin. Patent anterior communicating artery. There has been migration of the thromboembolism through the terminal segment into the A2 segment compared to the prior CT. Procedural findings: Baseline MCA flow: TICI 0 Left MCA: After 1 pass, there is no significant restoration of flow. Upon placement of the stent tree bronch the second attempt, and angiogram demonstrated no significant flow through the solitaire. After the second pass, complete restoration of flow through the MCA territory was achieved, TICI 3. During this time, there was further migration of the A2 embolus to the pericallosal artery, with the ultimate occlusion approximately level to the coronal suture. Given that the anatomy would not allow and navigation of the microcatheter and intermediate catheter into the segment, and given the perceived density of the ACA thromboembolism, we ultimately withdrew from further attempts thrombectomy when the wire and microcatheter would not pass through the face of the thrombus. Final angiogram demonstrated no evidence of extravasation of contrast at this level. The flat panel CT demonstrates inter hemispheric subarachnoid hemorrhage without mass  effect. Angiogram of the cervical ICA upon withdrawal of the balloon catheter demonstrated long segment irregularity, with variations in the diameter extending from beyond the bifurcation to the horizontal petrous segment. The irregularity was not only spatial but variations in temporal appearance, as the narrowing worsen to  greater than 80%, such as that demonstrated on image 5/7 angiogram 23. After 20 minutes of placing orogastric tube and giving dual antiplatelets dosage, anticipating stenting, a repeat angiogram demonstrated improvement in the appearance of the diameter, image 5/7 of angiogram 24. At this time we elected not to stent. PROCEDURE: The anesthesia team was present to provide general endotracheal tube anesthesia and for patient monitoring during the procedure. Intubation was performed in negative pressure Bay in neuro IR holding. Interventional neuro radiology nursing staff was also present. Ultrasound survey of the right inguinal region was performed with images stored and sent to PACs. 11 blade scalpel was used to make a small incision. Blunt dissection was performed with US guidance. A micropuncture needle was used access the right common femoral artery under ultrasound. With excellent arterial blood flow returned, an .018 micro wire was passed through the needle, observed to enter the abdominal aorta under fluoroscopy. The needle was removed, and a micropuncture sheath was placed over the wire. The inner dilator and wire were removed, and an 035 wire was advanced under fluoroscopy into the abdominal aorta. The sheath was removed and a 25cm 84F straight vascular sheath was placed. The dilator was removed and the sheath was flushed. Sheath was attached to pressurized and heparinized saline bag for constant forward flow. A coaxial system was then advanced over the 035 wire. This included a 95cm 087 "Walrus" balloon guide with coaxial 125cm Berenstein diagnostic catheter. This was advanced to the  proximal descending thoracic aorta. Wire was then removed. Double flush of the catheter was performed. Catheter was then used to select the left common carotid artery. Angiogram was performed. Using roadmap technique, the catheter was advanced over a standard glide wire into left cervical ICA, with luminal position achieved of the guide catheter. Wire was removed and angiogram was performed of the left carotid artery. Initial angiogram revealed no intimal/luminal irregularity and we proceeded with placement of the balloon guide into the distal cervical ICA. The balloon guide and the parents teen catheter were advanced with a Glidewire into the distal cervical segment of the ICA. Glidewire and the Berenstein catheter were gently removed and double flush was performed. Formal angiogram was performed. Road map function was used once the occluded vessel was identified. Copious back flush was performed and the balloon catheter was attached to heparinized and pressurized saline bag for forward flow. A second coaxial system was then advanced through the balloon catheter, which included the selected intermediate catheter, microcatheter, and microwire. In this scenario, the set up included intermediate catheter, large-bore aspiration catheter, 135 cm, a Trevo Provue18 microcatheter, and 014 synchro soft wire. This system was advanced through the balloon guide catheter under the road-map function, with adequate back-flush at the rotating hemostatic valve at that back end of the balloon guide. Microcatheter and the intermediate catheter system were advanced through the terminal ICA and MCA to the level of the occlusion. The micro wire was then carefully advanced through the occluded segment. Microcatheter was then manipulated through the occluded segment and the wire was removed with saline drip at the hub. Blood was then aspirated through the hub of the microcatheter, and a gentle contrast injection was performed confirming  intraluminal position. A rotating hemostatic valve was then attached to the back end of the microcatheter, and a pressurized and heparinized saline bag was attached to the catheter. 4 x 40 solitaire device was then selected. Back flush was achieved at the rotating hemostatic valve, and then the device was gently  advanced through the microcatheter to the distal end. The retriever was then unsheathed by withdrawing the microcatheter under fluoroscopy. Once the retriever was completely unsheathed, the microcatheter was carefully stripped from the delivery device. A 3 minute time interval was observed. The balloon at the balloon guide catheter was then inflated under fluoroscopy for proximal flow arrest. Constant aspiration using the proprietary engine was then performed at the intermediate catheter, as the retriever was gently and slowly withdrawn with fluoroscopic observation. Once the retriever was "corked" within the tip of the intermediate catheter, both were removed from the system. Free aspiration was confirmed at the hub of the balloon guide catheter, with free blood return confirmed. The balloon was then deflated, and a control angiogram was performed. The M1 segment remained occluded after the first pass. We then attempted a second pass. Microcatheter and the intermediate catheter system were again advanced through the terminal ICA and MCA to the level of the occlusion. The micro wire was then carefully advanced through the occluded segment. Microcatheter was then manipulated through the occluded segment and the wire was removed with saline drip at the hub. Blood was then aspirated through the hub of the microcatheter, and a gentle contrast injection was performed confirming intraluminal position. A rotating hemostatic valve was then attached to the back end of the microcatheter, and a pressurized and heparinized saline bag was attached to the catheter. 4 x 40 solitaire device was again used. Back flush was  achieved at the rotating hemostatic valve, and then the device was gently advanced through the microcatheter to the distal end. The retriever was then unsheathed by withdrawing the microcatheter under fluoroscopy. Once the retriever was completely unsheathed, the microcatheter was carefully stripped from the delivery device. Control angiogram was performed from the intermediate catheter, demonstrating that there was no flow through the open solitaire. A 3 minute time interval was observed. The balloon at the balloon guide catheter was then inflated under fluoroscopy for proximal flow arrest. Aspiration engine was turned on. Intermediate catheter was gently advanced over the proximal solitaire to the level of the occlusion, with flow arrest. Constant aspiration using the proprietary engine was then performed at the intermediate catheter, as both the retriever and the intermediate catheter were gently and slowly withdrawn with fluoroscopic observation. Once the retriever was "corked" within the tip of the intermediate catheter, both were removed from the system. Free aspiration was confirmed at the hub of the balloon guide catheter, with free blood return confirmed. The balloon was then deflated, and a control angiogram was performed. Restoration of left MCA flow was confirmed. The initial left A2 segment occlusion was observed to have migrated distally to the pericallosal artery. Given the patient's young age we decided to proceed with attempt at mechanical thrombectomy of this ACA embolus. ACA thrombectomy attempt: The walrus balloon catheter remained in the distal cervical segment, proximal to the horizontal petrous segment. We advanced a coaxial wire and catheter system which included 014 synchro soft wire, Zoom 35 intermediate catheter, and tree though pro view 18 microcatheter. This was advanced through the cavernous segment to the supraclinoid segment. This catheter and wire combination failed to engage the  angulated A1 origin. We then attempted a coaxial system which included CT 5 intermediate catheter, 150cm phenom 21 microcatheter, and synchro soft wire. This coaxial system was advanced through the cavernous segment, supraclinoid segment, and failed to engage the A1 segment. Various micro wires were then used including Aristotle wire, and double angle headliner wire. The 014 headliner  wire was ultimately successful engaging the A1 segment, with a pericallosal position achieved. The intermediate catheter would not make the nearly 180 degree turn into the A1 segment. After several attempts at achieving n/a 1 position with the intermediate catheter, we decided that we could attempt to use an anchoring technique by deploying stent tree verb across the embolism, and then bringing the intermediate catheter over the microcatheter and stent deployed stent tree verb. This would require, however, passing through the embolus with the microcatheter to deployed the stent tree verge. The microcatheter and microwire combination would not pass gently through the thromboembolism within the pericallosal artery, with quite a dense character observed when the wire and microcatheter were at the face of the thrombus. Ultimately we decided to withdraw from this attempt given the difficulty, and the apparent reactivity of the patient's carotid artery as we observed spasm and irregularity at the skull base on each subsequent control angiogram. The microcatheter was withdrawn to the cavernous segment of the ICA. Wire was removed. Exchange length floppy tip transcend wire was then placed through the microcatheter into a safe position. The balloon guide catheter was then withdrawn into the common carotid artery and angiogram was performed. This revealed irregularity and narrowing in the proximal ICA, which continued with multiple variation in diameter through the cervical ICA to the distal segment at the site of the prior inflated balloon.  These findings were discussed with the neurology team, with concern for dissection, as there was irregularity at this segment on the preprocedural CT scan, indicating possible vasculitis/dissection as not only the current imaging diagnosis, but potentially site of the thromboembolism origin. At this point we performed flat panel CT scan to observe for ICH/S AH, as treatment with a stent would require dual anti-platelet therapy. Flat panel CT demonstrated thin subarachnoid hemorrhage in the inter hemispheric fissure. Interval angiogram of the ICA was performed to determine the need for any possible stenting. 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 This imaging finding was concerning for progression of a possible intramural hematoma/dissection and at this point we elected to move forward with stenting. At least 20 minutes were required to place an orogastric tube and then deliver our prescribed dose of aspirin and Plavix. Once these events had occurred, repeat angiogram was performed. Interestingly this image revealed significant improvement at the cervical segment, with residual irregularity (image XA #24). At this point we interpreted this as either improving vaso spasm or improving sequela of vasculitis/dissection. Given the flat panel head CT findings and the desire to avoid dual anti-platelet therapy long-term, we elected to withdraw from the case at this point with no stent deployed. Balloon guide was then removed, with all catheters and wires removed. The skin at the puncture site was then cleaned with Chlorhexidine. The 8 French sheath was removed and an 74F angioseal was deployed. Patient tolerated the procedure well and remained hemodynamically stable throughout. Blood loss estimated at 150 cc. IMPRESSION: Status post ultrasound guided access right common femoral artery for cervical/cerebral angiogram and mechanical thrombectomy of left  MCA restoring complete flow to the left MCA territory, and failed attempt at revascularization of left pericallosal thromboembolism. Flat panel CT at the completion demonstrates thin subarachnoid hemorrhage in the anterior inter hemispheric fissure. Angio-Seal deployed for hemostasis. Signed, Yvone Neu. Reyne Dumas, RPVI [facility] Radiology PLAN: The patient will remain intubated, as she remains COVID status unknown ICU status Target systolic blood  pressure of 140-160 Right hip straight time 6 hours Frequent neurovascular checks Repeat neurologic imaging with CT and/MRI at the discretion of neurology team Electronically Signed   By: Gilmer Mor D.O.   On: 05/21/2020 08:57   DG Chest Port 1 View  Result Date: 05/31/2020 CLINICAL DATA:  Sob,code stroke ,COVID positive EXAM: PORTABLE CHEST 1 VIEW COMPARISON:  Chest radiograph 05/28/2020 FINDINGS: Stable cardiomediastinal contours with enlarged heart size. Minimal opacities at the right lung base likely reflecting atelectasis. Left lung is clear. No pneumothorax or significant pleural effusion. No acute finding in the visualized skeleton. IMPRESSION: Minimal opacities at the right lung base likely reflecting atelectasis. Electronically Signed   By: Emmaline Kluver M.D.   On: 05/31/2020 09:08   DG CHEST PORT 1 VIEW  Result Date: 05/28/2020 CLINICAL DATA:  Fever.  COVID positive. EXAM: PORTABLE CHEST 1 VIEW COMPARISON:  05/22/2020 FINDINGS: Midline trachea.  Normal heart size and mediastinal contours. Sharp costophrenic angles. No pneumothorax. Clear lungs. Mild right hemidiaphragm elevation. IMPRESSION: No active disease. Electronically Signed   By: Jeronimo Greaves M.D.   On: 05/28/2020 08:14   DG CHEST PORT 1 VIEW  Result Date: 05/22/2020 CLINICAL DATA:  COVID-19 positive. EXAM: PORTABLE CHEST 1 VIEW COMPARISON:  May 20, 2020. FINDINGS: Stable cardiomediastinal silhouette. Endotracheal and nasogastric tubes  have been removed. No pneumothorax or pleural effusion is noted. Lungs are clear. Bony thorax is unremarkable. IMPRESSION: No active disease. Electronically Signed   By: Lupita Raider M.D.   On: 05/22/2020 08:36   DG CHEST PORT 1 VIEW  Result Date: 05/20/2020 CLINICAL DATA:  CABG 19 positive, respiratory failure EXAM: PORTABLE CHEST 1 VIEW COMPARISON:  05/19/2020 chest radiograph. FINDINGS: Endotracheal tube tip is 4.1 cm above the carina. Enteric tube terminates in the gastric fundus. Stable cardiomediastinal silhouette with normal heart size. No pneumothorax. No pleural effusion. Mild platelike atelectasis in the mid to lower lungs. No pulmonary edema. IMPRESSION: 1. Well-positioned support structures. 2. Mild platelike atelectasis in the mid to lower lungs. Electronically Signed   By: Delbert Phenix M.D.   On: 05/20/2020 09:09   DG CHEST PORT 1 VIEW  Result Date: 05/19/2020 CLINICAL DATA:  LEFT MCA infarct and intracranial hemorrhage. Respiratory failure. COVID positive. EXAM: PORTABLE CHEST 1 VIEW COMPARISON:  11/07/2016 chest radiograph FINDINGS: An endotracheal tube is noted with tip 3 cm above the carina. An NG tube is noted coiled in the stomach. This is a mildly low volume film with mild bibasilar opacities which may represent atelectasis or airspace disease. No pleural effusion or pneumothorax. No acute bony abnormalities are present. IMPRESSION: 1. Endotracheal tube and NG tube placement. 2. Mildly low volume film with mild bibasilar opacities which may represent atelectasis or infection/pneumonia. Electronically Signed   By: Harmon Pier M.D.   On: 05/19/2020 18:03   DG Swallowing Func-Speech Pathology  Result Date: 05/22/2020 Objective Swallowing Evaluation: Type of Study: MBS-Modified Barium Swallow Study  Patient Details Name: Jilliana Burkes MRN: [id number] Date of Birth: 11/21/1995 Today's Date: 05/22/2020 Time: SLP Start Time (ACUTE ONLY): 1449 -SLP Stop Time (ACUTE ONLY): 1509 SLP Time  Calculation (min) (ACUTE ONLY): 20 min Past Medical History: Past Medical History: Diagnosis Date . Anxiety  . Panic attack  Past Surgical History: Past Surgical History: Procedure Laterality Date . IR CT HEAD LTD  05/19/2020 . IR CT HEAD LTD  05/19/2020 . IR PERCUTANEOUS ART THROMBECTOMY/INFUSION INTRACRANIAL INC DIAG ANGIO  05/19/2020 . IR US GUIDE VASC ACCESS RIGHT  05/19/2020 . RADIOLOGY  WITH ANESTHESIA N/A 05/19/2020  Procedure: IR WITH ANESTHESIA;  Surgeon: Radiologist, Medication, MD;  Location: [facility] OR;  Service: Radiology;  Laterality: N/A; HPI: Pt is a 24 yo female presenting with sudden onset R sided weakness and aphasia. 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 She was intubated for thrombectomy 8/21. 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 MRI showed early subacute infarct of the left basal ganglia involving the posterior putamen and caudate body. Pt self-extubated 8/22. Pt tested (+) for COVID-19 but was asymptomatic upon arrival. PMH: panic attack, anxiety  Subjective: alert, cooperative, aphasic Assessment / Plan / Recommendation CHL IP CLINICAL IMPRESSIONS 05/22/2020 Clinical Impression Pt presents with a mild oral dysphagia, but with relatively intact pharyngeal function. She has reduced labial seal with anterior spillage that happens with thin liquids via cup and straw, and is not necessarily localized to one side of her mouth. She has lingual pumping, with more time needed to clear solids from her oral cavity compared to liquids. Lingual pumping with solids is sometimes moderately prolonged, and she is not able to clear the barium tablet before losing it sublingually. After she is able to retrieve it, she masticated it rather than trying again to swallow it whole. There is some premature spillage with thin liquids, at times filling the valleculae and pyriform sinuses and with varying amounts of time that passes before she swallows, dependent upon how much longer it takes  her to clear her mouth. Despite this, there is no aspiration across challenging. She had a few instances of trace and transient penetration. Throat clearing was not noted with each episode of transient penetration, but was also not observed outside of these episodes. Recommend that pt begin Dys 3 (mechanical soft) diet and thin liquids.  SLP Visit Diagnosis Dysphagia, oral phase (R13.11) Attention and concentration deficit following -- Frontal lobe and executive function deficit following -- Impact on safety and function Mild aspiration risk   CHL IP TREATMENT RECOMMENDATION 05/22/2020 Treatment Recommendations Therapy as outlined in treatment plan below   Prognosis 05/22/2020 Prognosis for Safe Diet Advancement Good Barriers to Reach Goals Language deficits Barriers/Prognosis Comment -- CHL IP DIET RECOMMENDATION 05/22/2020 SLP Diet Recommendations Dysphagia 3 (Mech soft) solids;Thin liquid Liquid Administration via Straw;Cup Medication Administration Whole meds with puree Compensations Slow rate;Small sips/bites Postural Changes Seated upright at 90 degrees   CHL IP OTHER RECOMMENDATIONS 05/22/2020 Recommended Consults -- Oral Care Recommendations Oral care BID Other Recommendations --   CHL IP FOLLOW UP RECOMMENDATIONS 05/22/2020 Follow up Recommendations Inpatient Rehab   CHL IP FREQUENCY AND DURATION 05/22/2020 Speech Therapy Frequency (ACUTE ONLY) min 2x/week Treatment Duration 2 weeks      CHL IP ORAL PHASE 05/22/2020 Oral Phase Impaired Oral - Pudding Teaspoon -- Oral - Pudding Cup -- Oral - Honey Teaspoon -- Oral - Honey Cup -- Oral - Nectar Teaspoon -- Oral - Nectar Cup -- Oral - Nectar Straw -- Oral - Thin Teaspoon -- Oral - Thin Cup Left anterior bolus loss;Right anterior bolus loss;Lingual pumping;Reduced posterior propulsion;Delayed oral transit;Decreased bolus cohesion;Premature spillage Oral - Thin Straw Left anterior bolus loss;Right anterior bolus loss;Lingual pumping;Reduced posterior propulsion;Delayed  oral transit;Decreased bolus cohesion;Premature spillage Oral - Puree Left anterior bolus loss;Right anterior bolus loss;Lingual pumping;Reduced posterior propulsion;Delayed oral transit;Decreased bolus cohesion Oral - Mech Soft Left anterior bolus loss;Right anterior bolus loss;Lingual pumping;Reduced posterior propulsion;Delayed oral transit;Decreased bolus cohesion;Lingual/palatal residue Oral - Regular -- Oral - Multi-Consistency -- Oral - Pill Left anterior bolus  loss;Right anterior bolus loss;Lingual pumping;Reduced posterior propulsion;Delayed oral transit;Pocketing in anterior sulcus Oral Phase - Comment --  CHL IP PHARYNGEAL PHASE 05/22/2020 Pharyngeal Phase Impaired Pharyngeal- Pudding Teaspoon -- Pharyngeal -- Pharyngeal- Pudding Cup -- Pharyngeal -- Pharyngeal- Honey Teaspoon -- Pharyngeal -- Pharyngeal- Honey Cup -- Pharyngeal -- Pharyngeal- Nectar Teaspoon -- Pharyngeal -- Pharyngeal- Nectar Cup -- Pharyngeal -- Pharyngeal- Nectar Straw -- Pharyngeal -- Pharyngeal- Thin Teaspoon -- Pharyngeal -- Pharyngeal- Thin Cup Penetration/Aspiration before swallow Pharyngeal Material enters airway, remains ABOVE vocal cords then ejected out Pharyngeal- Thin Straw Penetration/Aspiration before swallow Pharyngeal Material enters airway, remains ABOVE vocal cords then ejected out Pharyngeal- Puree WFL Pharyngeal -- Pharyngeal- Mechanical Soft WFL Pharyngeal -- Pharyngeal- Regular -- Pharyngeal -- Pharyngeal- Multi-consistency -- Pharyngeal -- Pharyngeal- Pill WFL Pharyngeal -- Pharyngeal Comment --  CHL IP CERVICAL ESOPHAGEAL PHASE 05/22/2020 Cervical Esophageal Phase WFL Pudding Teaspoon -- Pudding Cup -- Honey Teaspoon -- Honey Cup -- Nectar Teaspoon -- Nectar Cup -- Nectar Straw -- Thin Teaspoon -- Thin Cup -- Thin Straw -- Puree -- Mechanical Soft -- Regular -- Multi-consistency -- Pill -- Cervical Esophageal Comment -- Mahala Menghini., M.A. CCC-SLP Acute Rehabilitation Services Pager [phone] Office [phone]  05/22/2020, 3:28 PM              EEG adult  Result Date: 06/01/2020 Charlsie Quest, MD     06/01/2020  5:25 PM Patient Name: Jericha Bryden MRN: [id number] Epilepsy Attending: Charlsie Quest Referring Physician/Provider: Dr. Susa Raring Date: 06/01/2020 Duration: 23.16 minutes Patient history: 24 year old female presented with right-sided weakness and aphasia.  MRI showed left MCA infarct.  EEG to evaluate for seizures. Level of alertness: Awake AEDs during EEG study: None Technical aspects: This EEG study was done with scalp electrodes positioned according to the 10-20 International system of electrode placement. Electrical activity was acquired at a sampling rate of 500Hz  and reviewed with a high frequency filter of 70Hz  and a low frequency filter of 1Hz . EEG data were recorded continuously and digitally stored. Description: No posterior dominant was seen.  EEG showed continuous generalized and lateralized left hemisphere 3 to 5 Hz theta-delta slowing.  2 to 3 Hz rhythmic delta slowing was also noted in left hemisphere, maximal left posterior quadrant.  Hyperventilation and photic stimulation were not performed.   Of note, study was technically difficult due to significant electrode and movement artifact. ABNORMALITY - Continuous slow, generalized and lateralized left hemisphere - Intermittent rhythmic delta slowing, left hemisphere, maximal left posterior quadrant IMPRESSION: This technically difficult study is suggestive of cortical dysfunction in left hemisphere, maximal left posterior quadrant.  Lateralized rhythmic delta activity was seen in left hemisphere which is on the ictal-interictal continuum with low potential for seizures.  Additionally, there is evidence of moderate diffuse encephalopathy, nonspecific etiology.  No seizures or epileptiform discharges were seen throughout the recording. Priyanka O Yadav   VAS [place] TRANSCRANIAL DOPPLER W BUBBLES  Result Date: 05/28/2020  Transcranial Doppler with  Bubble Indications: Stroke. Comparison Study: No prior study Performing Technologist: Gertie Fey MHA, RDMS, RVT, RDCS  Examination Guidelines: A complete evaluation includes B-mode imaging, spectral Doppler, color Doppler, and power Doppler as needed of all accessible portions of each vessel. Bilateral testing is considered an integral part of a complete examination. Limited examinations for reoccurring indications may be performed as noted.  Summary:  A vascular evaluation was performed. The left middle cerebral artery was studied. An IV was inserted into the patient's left PICC line. Verbal informed consent was obtained.  HITS (high intensity transient signals) were heard at rest, suggestive of a possible Spencer Grade 3 PFO. 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  Diagnosing physician: Delia Heady MD Electronically signed by Delia Heady MD on 05/28/2020 at 1:43:29 PM.    Final    ECHOCARDIOGRAM COMPLETE  Result Date: 05/20/2020    ECHOCARDIOGRAM REPORT   Patient Name:   JALYNN WADDELL Date of Exam: 05/20/2020 Medical Rec #:  [id number]    Height:       64.0 in Accession #:    [id number]   Weight:       185.6 lb Date of Birth:  1996/01/14    BSA:          1.895 m Patient Age:    24 years     BP:           127/71 mmHg Patient Gender: F            HR:           64 bpm. Exam Location:  Inpatient Procedure: 2D Echo Indications:    Stroke  History:        Patient has no prior history of Echocardiogram examinations.                 Risk Factors:Current Smoker. Covid Positive.  Sonographer:    Jeryl Columbia Referring Phys: [phone] MCNEILL P KIRKPATRICK IMPRESSIONS  1. Left ventricular ejection fraction, by estimation, is 60 to 65%. The left ventricle has normal function. The left ventricle has no regional wall motion abnormalities. Left ventricular diastolic parameters were normal.  2. Right ventricular systolic  function is normal. The right ventricular size is normal.  3. The mitral valve is normal in structure. Trivial mitral valve regurgitation. No evidence of mitral stenosis.  4. The aortic valve is normal in structure. Aortic valve regurgitation is not visualized. No aortic stenosis is present.  5. The inferior vena cava is normal in size with greater than 50% respiratory variability, suggesting right atrial pressure of 3 mmHg. Comparison(s): No prior Echocardiogram. Conclusion(s)/Recommendation(s): Normal biventricular function without evidence of hemodynamically significant valvular heart disease. No intracardiac source of embolism detected on this transthoracic study. A transesophageal echocardiogram is recommended to exclude cardiac source of embolism if clinically indicated. FINDINGS  Left Ventricle: Left ventricular ejection fraction, by estimation, is 60 to 65%. The left ventricle has normal function. The left ventricle has no regional wall motion abnormalities. The left ventricular internal cavity size was normal in size. There is  no left ventricular hypertrophy. Left ventricular diastolic parameters were normal. Right Ventricle: The right ventricular size is normal. No increase in right ventricular wall thickness. Right ventricular systolic function is normal. Left Atrium: Left atrial size was normal in size. Right Atrium: Right atrial size was normal in size. Pericardium: There is no evidence of pericardial effusion. Mitral Valve: The mitral valve is normal in structure. Normal mobility of the mitral valve leaflets. Trivial mitral valve regurgitation. No evidence of mitral valve stenosis. Tricuspid Valve: The tricuspid valve is normal in structure. Tricuspid valve regurgitation is trivial. No evidence of tricuspid stenosis. Aortic Valve: The aortic valve is normal in structure. Aortic valve regurgitation is not visualized. No aortic stenosis is present. Pulmonic Valve: The pulmonic valve was normal in  structure. Pulmonic valve regurgitation is not visualized. No evidence of pulmonic stenosis. Aorta: The aortic root is normal in size and structure. Venous: The inferior  vena cava is normal in size with greater than 50% respiratory variability, suggesting right atrial pressure of 3 mmHg. IAS/Shunts: No atrial level shunt detected by color flow Doppler.  LEFT VENTRICLE PLAX 2D LVIDd:         4.00 cm LVIDs:         2.60 cm LV PW:         1.20 cm LV IVS:        1.00 cm LVOT diam:     1.90 cm LVOT Area:     2.84 cm  RIGHT VENTRICLE TAPSE (M-mode): 2.6 cm LEFT ATRIUM             Index       RIGHT ATRIUM           Index LA diam:        3.00 cm 1.58 cm/m  RA Area:     14.30 cm LA Vol (A2C):   52.4 ml 27.65 ml/m RA Volume:   36.50 ml  19.26 ml/m LA Vol (A4C):   38.2 ml 20.15 ml/m LA Biplane Vol: 44.9 ml 23.69 ml/m   AORTA Ao Root diam: 2.70 cm  SHUNTS Systemic Diam: 1.90 cm Tobias Alexander MD Electronically signed by Tobias Alexander MD Signature Date/Time: 05/20/2020/4:13:45 PM    Final    IR PERCUTANEOUS ART THROMBECTOMY/INFUSION INTRACRANIAL INC DIAG ANGIO  Result Date: 05/21/2020 INDICATION: 24 year old female with a history of acute left MCA syndrome, presents for mechanical thrombectomy COVID test was confirmed positive after initiation of the case EXAM: ULTRASOUND-GUIDED RIGHT COMMON FEMORAL ARTERY ACCESS CERVICAL AND CEREBRAL ANGIOGRAM MECHANICAL THROMBECTOMY LEFT MCA ANGIO-SEAL FOR HEMOSTASIS COMPARISON:  CT imaging same day MEDICATIONS: 200 mcg intra arterial nitroglycerin. ANESTHESIA/SEDATION: The anesthesia team was present to provide general endotracheal tube anesthesia and for patient monitoring during the procedure. Intubation was performed in negative pressure [place] in neuro IR holding. Left radial arterial line was performed by the anesthesia team. Interventional neuro radiology nursing staff was also present. CONTRAST:  150 cc FLUOROSCOPY TIME:  Fluoroscopy Time: 53 minutes 36 seconds (1651 mGy).  COMPLICATIONS: SIR LEVEL C - Requires therapy, minor hospitalization (<48 hrs). Subarachnoid hemorrhage TECHNIQUE: Informed written consent was obtained from the patient's family after a thorough discussion of the procedural risks, benefits and alternatives. Specific risks discussed include: Bleeding, infection, contrast reaction, kidney injury/failure, need for further procedure/surgery, arterial injury or dissection, embolization to new territory, intracranial hemorrhage (10-15% risk), neurologic deterioration, cardiopulmonary collapse, death. All questions were addressed. Maximal Sterile Barrier Technique was utilized including during the procedure including caps, mask, sterile gowns, sterile gloves, sterile drape, hand hygiene and skin antiseptic. A timeout was performed prior to the initiation of the procedure. The anesthesia team was present to provide general endotracheal tube anesthesia and for patient monitoring during the procedure. Interventional neuro radiology nursing staff was also present. FINDINGS: Initial Findings: Left common carotid artery: ICA patent with no tortuosity and no significant atherosclerosis. Separate branch from the aortic arch. Left external carotid artery: Patent with antegrade flow. Left internal carotid artery: The common carotid artery is relatively straight with no beading or significant narrowing. The AP image on the initial image demonstrates perhaps a slight irregularity on the lateral intimal surface which may correspond to the coronal reformatted images on the comparison CT preprocedure (image 25 of series 9). Given that there was no flow limiting problem, we elected to proceed with the thrombectomy Left MCA: The vertical horizontal petrous segment patent. Segment lacerum patent. Cavernous and supraclinoid patent. Occlusion  of the proximal M1 segment with visible filling defect compatible with the findings on the CT. Left ACA: A1 is patent. There is a nearly 180 degree  angulation of the A1 origin. Patent anterior communicating artery. There has been migration of the thromboembolism through the terminal segment into the A2 segment compared to the prior CT. Procedural findings: Baseline MCA flow: TICI 0 Left MCA: After 1 pass, there is no significant restoration of flow. Upon placement of the stent tree bronch the second attempt, and angiogram demonstrated no significant flow through the solitaire. After the second pass, complete restoration of flow through the MCA territory was achieved, TICI 3. During this time, there was further migration of the A2 embolus to the pericallosal artery, with the ultimate occlusion approximately level to the coronal suture. Given that the anatomy would not allow and navigation of the microcatheter and intermediate catheter into the segment, and given the perceived density of the ACA thromboembolism, we ultimately withdrew from further attempts thrombectomy when the wire and microcatheter would not pass through the face of the thrombus. Final angiogram demonstrated no evidence of extravasation of contrast at this level. The flat panel CT demonstrates inter hemispheric subarachnoid hemorrhage without mass effect. Angiogram of the cervical ICA upon withdrawal of the balloon catheter demonstrated long segment irregularity, with variations in the diameter extending from beyond the bifurcation to the horizontal petrous segment. The irregularity was not only spatial but variations in temporal appearance, as the narrowing worsen to greater than 80%, such as that demonstrated on image 5/7 angiogram 23. After 20 minutes of placing orogastric tube and giving dual antiplatelets dosage, anticipating stenting, a repeat angiogram demonstrated improvement in the appearance of the diameter, image 5/7 of angiogram 24. At this time we elected not to stent. PROCEDURE: The anesthesia team was present to provide general endotracheal tube anesthesia and for patient  monitoring during the procedure. Intubation was performed in negative pressure [place] in neuro IR holding. Interventional neuro radiology nursing staff was also present. Ultrasound survey of the right inguinal region was performed with images stored and sent to PACs. 11 blade scalpel was used to make a small incision. Blunt dissection was performed with US guidance. A micropuncture needle was used access the right common femoral artery under ultrasound. With excellent arterial blood flow returned, an .018 micro wire was passed through the needle, observed to enter the abdominal aorta under fluoroscopy. The needle was removed, and a micropuncture sheath was placed over the wire. The inner dilator and wire were removed, and an 035 wire was advanced under fluoroscopy into the abdominal aorta. The sheath was removed and a 25cm 29F straight vascular sheath was placed. The dilator was removed and the sheath was flushed. Sheath was attached to pressurized and heparinized saline bag for constant forward flow. A coaxial system was then advanced over the 035 wire. This included a 95cm 087 "Walrus" balloon guide with coaxial 125cm Berenstein diagnostic catheter. This was advanced to the proximal descending thoracic aorta. Wire was then removed. Double flush of the catheter was performed. Catheter was then used to select the left common carotid artery. Angiogram was performed. Using roadmap technique, the catheter was advanced over a standard glide wire into left cervical ICA, with luminal position achieved of the guide catheter. Wire was removed and angiogram was performed of the left carotid artery. Initial angiogram revealed no intimal/luminal irregularity and we proceeded with placement of the balloon guide into the distal cervical ICA. The balloon guide and the parents teen  catheter were advanced with a Glidewire into the distal cervical segment of the ICA. Glidewire and the Berenstein catheter were gently removed and double  flush was performed. Formal angiogram was performed. Road map function was used once the occluded vessel was identified. Copious back flush was performed and the balloon catheter was attached to heparinized and pressurized saline bag for forward flow. A second coaxial system was then advanced through the balloon catheter, which included the selected intermediate catheter, microcatheter, and microwire. In this scenario, the set up included intermediate catheter, large-bore aspiration catheter, 135 cm, a Trevo Provue18 microcatheter, and 014 synchro soft wire. This system was advanced through the balloon guide catheter under the road-map function, with adequate back-flush at the rotating hemostatic valve at that back end of the balloon guide. Microcatheter and the intermediate catheter system were advanced through the terminal ICA and MCA to the level of the occlusion. The micro wire was then carefully advanced through the occluded segment. Microcatheter was then manipulated through the occluded segment and the wire was removed with saline drip at the hub. Blood was then aspirated through the hub of the microcatheter, and a gentle contrast injection was performed confirming intraluminal position. A rotating hemostatic valve was then attached to the back end of the microcatheter, and a pressurized and heparinized saline bag was attached to the catheter. 4 x 40 solitaire device was then selected. Back flush was achieved at the rotating hemostatic valve, and then the device was gently advanced through the microcatheter to the distal end. The retriever was then unsheathed by withdrawing the microcatheter under fluoroscopy. Once the retriever was completely unsheathed, the microcatheter was carefully stripped from the delivery device. A 3 minute time interval was observed. The balloon at the balloon guide catheter was then inflated under fluoroscopy for proximal flow arrest. Constant aspiration using the proprietary engine  was then performed at the intermediate catheter, as the retriever was gently and slowly withdrawn with fluoroscopic observation. Once the retriever was "corked" within the tip of the intermediate catheter, both were removed from the system. Free aspiration was confirmed at the hub of the balloon guide catheter, with free blood return confirmed. The balloon was then deflated, and a control angiogram was performed. The M1 segment remained occluded after the first pass. We then attempted a second pass. Microcatheter and the intermediate catheter system were again advanced through the terminal ICA and MCA to the level of the occlusion. The micro wire was then carefully advanced through the occluded segment. Microcatheter was then manipulated through the occluded segment and the wire was removed with saline drip at the hub. Blood was then aspirated through the hub of the microcatheter, and a gentle contrast injection was performed confirming intraluminal position. A rotating hemostatic valve was then attached to the back end of the microcatheter, and a pressurized and heparinized saline bag was attached to the catheter. 4 x 40 solitaire device was again used. Back flush was achieved at the rotating hemostatic valve, and then the device was gently advanced through the microcatheter to the distal end. The retriever was then unsheathed by withdrawing the microcatheter under fluoroscopy. Once the retriever was completely unsheathed, the microcatheter was carefully stripped from the delivery device. Control angiogram was performed from the intermediate catheter, demonstrating that there was no flow through the open solitaire. A 3 minute time interval was observed. The balloon at the balloon guide catheter was then inflated under fluoroscopy for proximal flow arrest. Aspiration engine was turned on. Intermediate catheter was  gently advanced over the proximal solitaire to the level of the occlusion, with flow arrest. Constant  aspiration using the proprietary engine was then performed at the intermediate catheter, as both the retriever and the intermediate catheter were gently and slowly withdrawn with fluoroscopic observation. Once the retriever was "corked" within the tip of the intermediate catheter, both were removed from the system. Free aspiration was confirmed at the hub of the balloon guide catheter, with free blood return confirmed. The balloon was then deflated, and a control angiogram was performed. Restoration of left MCA flow was confirmed. The initial left A2 segment occlusion was observed to have migrated distally to the pericallosal artery. Given the patient's young age we decided to proceed with attempt at mechanical thrombectomy of this ACA embolus. ACA thrombectomy attempt: The walrus balloon catheter remained in the distal cervical segment, proximal to the horizontal petrous segment. We advanced a coaxial wire and catheter system which included 014 synchro soft wire, Zoom 35 intermediate catheter, and tree though pro view 18 microcatheter. This was advanced through the cavernous segment to the supraclinoid segment. This catheter and wire combination failed to engage the angulated A1 origin. We then attempted a coaxial system which included CT 5 intermediate catheter, 150cm phenom 21 microcatheter, and synchro soft wire. This coaxial system was advanced through the cavernous segment, supraclinoid segment, and failed to engage the A1 segment. Various micro wires were then used including Aristotle wire, and double angle headliner wire. The 014 headliner wire was ultimately successful engaging the A1 segment, with a pericallosal position achieved. The intermediate catheter would not make the nearly 180 degree turn into the A1 segment. After several attempts at achieving n/a 1 position with the intermediate catheter, we decided that we could attempt to use an anchoring technique by deploying stent tree verb across the  embolism, and then bringing the intermediate catheter over the microcatheter and stent deployed stent tree verb. This would require, however, passing through the embolus with the microcatheter to deployed the stent tree verge. The microcatheter and microwire combination would not pass gently through the thromboembolism within the pericallosal artery, with quite a dense character observed when the wire and microcatheter were at the face of the thrombus. Ultimately we decided to withdraw from this attempt given the difficulty, and the apparent reactivity of the patient's carotid artery as we observed spasm and irregularity at the skull base on each subsequent control angiogram. The microcatheter was withdrawn to the cavernous segment of the ICA. Wire was removed. Exchange length floppy tip transcend wire was then placed through the microcatheter into a safe position. The balloon guide catheter was then withdrawn into the common carotid artery and angiogram was performed. This revealed irregularity and narrowing in the proximal ICA, which continued with multiple variation in diameter through the cervical ICA to the distal segment at the site of the prior inflated balloon. These findings were discussed with the neurology team, with concern for dissection, as there was irregularity at this segment on the preprocedural CT scan, indicating possible vasculitis/dissection as not only the current imaging diagnosis, but potentially site of the thromboembolism origin. At this point we performed flat panel CT scan to observe for ICH/S AH, as treatment with a stent would require dual anti-platelet therapy. Flat panel CT demonstrated thin subarachnoid hemorrhage in the inter hemispheric fissure. Interval angiogram of the ICA was performed to determine the need for any possible stenting. This angiogram (image XA #22) revealed significant worsening of the cervical ICA, with greater than  80% stenosis. This imaging finding was  concerning for progression of a possible intramural hematoma/dissection and at this point we elected to move forward with stenting. At least 20 minutes were required to place an orogastric tube and then deliver our prescribed dose of aspirin and Plavix. Once these events had occurred, repeat angiogram was performed. Interestingly this image revealed significant improvement at the cervical segment, with residual irregularity (image XA #24). At this point we interpreted this as either improving vaso spasm or improving sequela of vasculitis/dissection. Given the flat panel head CT findings and the desire to avoid dual anti-platelet therapy long-term, we elected to withdraw from the case at this point with no stent deployed. Balloon guide was then removed, with all catheters and wires removed. The skin at the puncture site was then cleaned with Chlorhexidine. The 8 French sheath was removed and an 15F angioseal was deployed. Patient tolerated the procedure well and remained hemodynamically stable throughout. Blood loss estimated at 150 cc. IMPRESSION: Status post ultrasound guided access right common femoral artery for cervical/cerebral angiogram and mechanical thrombectomy of left MCA restoring complete flow to the left MCA territory, and failed attempt at revascularization of left pericallosal thromboembolism. Flat panel CT at the completion demonstrates thin subarachnoid hemorrhage in the anterior inter hemispheric fissure. Angio-Seal deployed for hemostasis. Signed, Yvone Neu. Reyne Dumas, RPVI [facility] Radiology PLAN: The patient will remain intubated, as she remains COVID status unknown ICU status Target systolic blood pressure of 140-160 Right hip straight time 6 hours Frequent neurovascular checks Repeat neurologic imaging with CT and/MRI at the discretion of neurology team Electronically Signed   By: Gilmer Mor D.O.   On: 05/21/2020 08:57   CT HEAD CODE  STROKE WO CONTRAST  Result Date: 05/19/2020 CLINICAL DATA:  Code stroke.  Stroke suspected. EXAM: CT HEAD WITHOUT CONTRAST TECHNIQUE: Contiguous axial images were obtained from the base of the skull through the vertex without intravenous contrast. COMPARISON:  None. FINDINGS: Brain: No evidence of acute infarction, hemorrhage, hydrocephalus, extra-axial collection or mass lesion/mass effect. Vascular: Dense left ICA terminus and MCA, extensive in compatible with thrombosis in this setting and assuming referable symptoms. CTA is underway. Skull: Negative Sinuses/Orbits: Negative Other: These results were communicated to Dr. Amada Jupiter at 11:25 amon 8/21/2021by text page via the [facility] messaging system. ASPECTS [facility] Stroke Program Early CT Score) - Ganglionic level infarction (caudate, lentiform nuclei, internal capsule, insula, M1-M3 cortex): 7 - Supraganglionic infarction (M4-M6 cortex): 3 Total score (0-10 with 10 being normal): 10 IMPRESSION: Dense left ICA terminus and MCA. No acute hemorrhage. ASPECTS is 10. Electronically Signed   By: Marnee Spring M.D.   On: 05/19/2020 11:27   VAS [place] LOWER EXTREMITY VENOUS (DVT)  Result Date: 05/29/2020  Lower Venous DVTStudy Indications: Fever. SARS-CoV-2 positive, and pulmonary embolism.  Comparison Study: 05/21/2020- negative bilateral lower extremity venous duplex Performing Technologist: Gertie Fey MHA, RDMS, RVT, RDCS  Examination Guidelines: A complete evaluation includes B-mode imaging, spectral Doppler, color Doppler, and power Doppler as needed of all accessible portions of each vessel. Bilateral testing is considered an integral part of a complete examination. Limited examinations for reoccurring indications may be performed as noted. The reflux portion of the exam is performed with the patient in reverse Trendelenburg.  +---------+---------------+---------+-----------+----------+--------------+ RIGHT     CompressibilityPhasicitySpontaneityPropertiesThrombus Aging +---------+---------------+---------+-----------+----------+--------------+ CFV      Full           Yes      Yes                                 +---------+---------------+---------+-----------+----------+--------------+  SFJ      Full                                                        +---------+---------------+---------+-----------+----------+--------------+ FV Prox  None                    No                   Acute          +---------+---------------+---------+-----------+----------+--------------+ FV Mid   None                                         Acute          +---------+---------------+---------+-----------+----------+--------------+ FV DistalNone                                         Acute          +---------+---------------+---------+-----------+----------+--------------+ PFV      Full                                                        +---------+---------------+---------+-----------+----------+--------------+ POP      None                    No                   Acute          +---------+---------------+---------+-----------+----------+--------------+ PTV      Full                    Yes                                 +---------+---------------+---------+-----------+----------+--------------+ PERO     Partial                 Yes                                 +---------+---------------+---------+-----------+----------+--------------+ Gastroc  None                    No                   Acute          +---------+---------------+---------+-----------+----------+--------------+   +---------+---------------+---------+-----------+----------+--------------+ LEFT     CompressibilityPhasicitySpontaneityPropertiesThrombus Aging +---------+---------------+---------+-----------+----------+--------------+ CFV      Full           Yes      Yes                                  +---------+---------------+---------+-----------+----------+--------------+ SFJ      Full                                                        +---------+---------------+---------+-----------+----------+--------------+  FV Prox  Full                                                        +---------+---------------+---------+-----------+----------+--------------+ FV Mid   Full                                                        +---------+---------------+---------+-----------+----------+--------------+ FV DistalFull                                                        +---------+---------------+---------+-----------+----------+--------------+ PFV      Full                                                        +---------+---------------+---------+-----------+----------+--------------+ POP      Full           Yes      Yes                                 +---------+---------------+---------+-----------+----------+--------------+ PTV      Full                                                        +---------+---------------+---------+-----------+----------+--------------+ PERO     Full                                                        +---------+---------------+---------+-----------+----------+--------------+     Summary: RIGHT: - Findings consistent with acute deep vein thrombosis involving the right femoral vein, right popliteal vein, and right gastrocnemius veins. - No cystic structure found in the popliteal fossa.  LEFT: - There is no evidence of deep vein thrombosis in the lower extremity.  - No cystic structure found in the popliteal fossa.  *See table(s) above for measurements and observations. Electronically signed by Coral Else MD on 05/29/2020 at 8:52:33 PM.    Final    VAS [place] LOWER EXTREMITY VENOUS (DVT)  Result Date: 05/21/2020  Lower Venous DVTStudy Indications: Stroke.  Limitations: Bandages. Comparison Study: No  prior studies. Performing Technologist: Jean Rosenthal  Examination Guidelines: A complete evaluation includes B-mode imaging, spectral Doppler, color Doppler, and power Doppler as needed of all accessible portions of each vessel. Bilateral testing is considered an integral part of a complete examination. Limited examinations for reoccurring indications may be performed as noted. The reflux portion of the exam is performed with the patient in reverse Trendelenburg.  +---------+---------------+---------+-----------+----------+-------------------+  RIGHT    CompressibilityPhasicitySpontaneityPropertiesThrombus Aging      +---------+---------------+---------+-----------+----------+-------------------+ CFV                                                   Unable to visualize                                                       due to bandages     +---------+---------------+---------+-----------+----------+-------------------+ SFJ                                                   Unable to visualize                                                       due to bandages     +---------+---------------+---------+-----------+----------+-------------------+ FV Prox  Full                                                             +---------+---------------+---------+-----------+----------+-------------------+ FV Mid   Full                                                             +---------+---------------+---------+-----------+----------+-------------------+ FV DistalFull                                                             +---------+---------------+---------+-----------+----------+-------------------+ PFV      Full                                                             +---------+---------------+---------+-----------+----------+-------------------+ POP      Full           Yes      Yes                                       +---------+---------------+---------+-----------+----------+-------------------+ PTV      Full                                                             +---------+---------------+---------+-----------+----------+-------------------+  PERO     Full                                                             +---------+---------------+---------+-----------+----------+-------------------+   +---------+---------------+---------+-----------+----------+-------------------+ LEFT     CompressibilityPhasicitySpontaneityPropertiesThrombus Aging      +---------+---------------+---------+-----------+----------+-------------------+ CFV      Full           Yes      Yes                                      +---------+---------------+---------+-----------+----------+-------------------+ SFJ      Full                                                             +---------+---------------+---------+-----------+----------+-------------------+ FV Prox  Full                                                             +---------+---------------+---------+-----------+----------+-------------------+ FV Mid   Full           Yes      Yes                  Some segments not                                                         visualized due to                                                         bandages            +---------+---------------+---------+-----------+----------+-------------------+ FV DistalFull           Yes      Yes                                      +---------+---------------+---------+-----------+----------+-------------------+ PFV      Full                                                             +---------+---------------+---------+-----------+----------+-------------------+ POP      Full           Yes      Yes                                      +---------+---------------+---------+-----------+----------+-------------------+  PTV      Full                                                             +---------+---------------+---------+-----------+----------+-------------------+ PERO     Full                                                             +---------+---------------+---------+-----------+----------+-------------------+    Summary: RIGHT: - There is no evidence of deep vein thrombosis in the lower extremity. However, portions of this examination were limited- see technologist comments above.  - No cystic structure found in the popliteal fossa.  LEFT: - There is no evidence of deep vein thrombosis in the lower extremity. However, portions of this examination were limited- see technologist comments above.  - No cystic structure found in the popliteal fossa.  *See table(s) above for measurements and observations. Electronically signed by Fabienne Bruns MD on 05/21/2020 at 5:11:44 PM.    Final    ECHOCARDIOGRAM LIMITED  Result Date: 05/29/2020    ECHOCARDIOGRAM LIMITED REPORT   Patient Name:   FLEUR AUDINO Date of Exam: 05/29/2020 Medical Rec #:  [id number]    Height:       64.0 in Accession #:    [id number]   Weight:       187.6 lb Date of Birth:  [birth year]    BSA:          1.904 m Patient Age:    24 years     BP:           119/77 mmHg Patient Gender: F            HR:           100 bpm. Exam Location:  Inpatient Procedure: Limited Echo Indications:    pulmonary embolism  History:        Patient has prior history of Echocardiogram examinations, most                 recent 05/20/2020. Stroke; Risk Factors:Current Smoker.  Sonographer:    Celene Skeen RDCS (AE) Referring Phys: [phone] A CALDWELL POWELL JR  Sonographer Comments: poor patient compliance (see comments). restricted mobility IMPRESSIONS  1. Left ventricular ejection fraction, by estimation, is 60 to 65%. The left ventricle has normal function. The left ventricle has no regional wall motion abnormalities.  2. Right ventricular systolic function is normal.  The right ventricular size is normal. Mildly D-shaped septum suggestive of RV pressure/volume overload.  3. The aortic valve is tricuspid. Aortic valve regurgitation is not visualized. No aortic stenosis is present.  4. The mitral valve is normal in structure. No evidence of mitral valve regurgitation. No evidence of mitral stenosis.  5. The inferior vena cava is normal in size with greater than 50% respiratory variability, suggesting right atrial pressure of 3 mmHg.  6. Limited echo. FINDINGS  Left Ventricle: Left ventricular ejection fraction, by estimation, is 60 to 65%. The left ventricle has normal function. The left ventricle has no regional wall motion abnormalities. The left ventricular internal cavity size was normal in size. There is  no left ventricular hypertrophy. Right Ventricle: The right ventricular size is normal. No increase in right ventricular wall thickness. Right ventricular systolic function is normal. Left Atrium: Left atrial size was normal in size. Right Atrium: Right atrial size was normal in size. Pericardium: Trivial pericardial effusion is present. Mitral Valve: The mitral valve is normal in structure. No evidence of mitral valve stenosis. Aortic Valve: The aortic valve is tricuspid. Aortic valve regurgitation is not visualized. No aortic stenosis is present. Pulmonic Valve: The pulmonic valve was normal in structure. Pulmonic valve regurgitation is not visualized. Venous: The inferior vena cava is normal in size with greater than 50% respiratory variability, suggesting right atrial pressure of 3 mmHg. IAS/Shunts: No atrial level shunt detected by color flow Doppler. Marca Ancona MD Electronically signed by Marca Ancona MD Signature Date/Time: 05/29/2020/2:55:34 PM    Final    [place] EKG SITE RITE  Result Date: 05/21/2020 If [facility] image not attached, placement could not be confirmed due to current cardiac rhythm.

## 2020-06-03 NOTE — Progress Notes (Signed)
STROKE TEAM PROGRESS NOTE   INTERVAL HISTORY Mom at bedside. Pt sitting in chair, smiling, interactive, but still not spontaneous talking. Per mom, pt said "stop, please" one time yesterday to her. She continues to improve on right LE strength, able to go to bathroom with max assistance per mom.   OBJECTIVE Vitals:   06/02/20 1626 06/02/20 2137 06/02/20 2325 06/03/20 0750  BP: 123/77 131/75  106/64  Pulse: (!) 109 (!) 102  (!) 113  Resp: 20 18  19   Temp: 99.6 F (37.6 C) (!) 100.7 F (38.2 C) 98.9 F (37.2 C) 99.7 F (37.6 C)  TempSrc:  Oral    SpO2: 100% 98%  98%  Weight:      Height:       CBC:  Recent Labs  Lab 06/02/20 0101 06/03/20 0326  WBC 8.3 8.2  HGB 8.9* 9.4*  HCT 27.4* 29.1*  MCV 86.7 87.1  PLT 485* 533*   Basic Metabolic Panel:  Recent Labs  Lab 05/29/20 0055 05/30/20 0317 06/02/20 0101 06/03/20 0326  NA 130*   < > 126* 127*  K 3.8   < > 3.5 4.1  CL 98   < > 96* 95*  CO2 21*   < > 19* 21*  GLUCOSE 104*   < > 119* 108*  BUN 9   < > 7 <5*  CREATININE 0.57   < > 0.54 0.50  CALCIUM 8.4*   < > 8.6* 8.9  MG 2.1   < > 1.8 2.0  PHOS 3.8  --   --   --    < > = values in this interval not displayed.   Lipid Panel:     Component Value Date/Time   CHOL 156 05/20/2020 0958   TRIG 105 05/20/2020 0958   TRIG 104 05/20/2020 0958   HDL 57 05/20/2020 0958   CHOLHDL 2.7 05/20/2020 0958   VLDL 21 05/20/2020 0958   LDLCALC 78 05/20/2020 0958   HgbA1c:  Lab Results  Component Value Date   HGBA1C 5.6 05/20/2020   Urine Drug Screen:     Component Value Date/Time   LABOPIA NONE DETECTED 05/20/2020 1736   COCAINSCRNUR NONE DETECTED 05/20/2020 1736   LABBENZ NONE DETECTED 05/20/2020 1736   AMPHETMU NONE DETECTED 05/20/2020 1736   THCU POSITIVE (A) 05/20/2020 1736   LABBARB NONE DETECTED 05/20/2020 1736    Alcohol Level No results found for: [facility]  [facility]AGING  CT HEAD CODE STROKE WO CONTRAST 05/19/2020 Dense left ICA terminus and MCA. No acute hemorrhage.  ASPECTS is 10.   CT Code Stroke CTA Head W/WO contrast CT Code Stroke CTA Neck W/WO contrast 05/19/2020 1. 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  2. Mildly indistinct appearance of the proximal left ICA is likely from motion artifact, consider cervical run. No atheromatous changes or vasculopathy seen in the neck.   Neuro Interventional Radiology - Cerebral Angiogram with Intervention - Dr Loreta AveWagner 05/19/20 2:27 PM Left MCA: Baseline TICI 0 First Pass Device:                 Local aspiration and solitaire 4 x 40              Result                         TICI 0 Second Pass Device:           Local aspiration and solitaire  4 x 40              Result                         TICI 3 Left ACA/pericallosal  Baseline TICI 0,    Final TICI 0 pericallosal artery Findings:  Patent right CFA Left M1 occlusion, final is TICI 3  Left ACA A2 occlusion migrated to pericallosal artery during the case.  Could not complete a first pass safely.  Significant irregularity of the cervical ICA, concerning for dissection, that resolved after observation, and once DAPT was initiated.  The final appearance may represent spasm vs FMD/vasculitis  CT HEAD WO CONTRAST 05/19/2020 New large volume of subarachnoid and intraventricular hemorrhage.  CT HEAD WO CONTRAST 05/20/2020 1. Subarachnoid hemorrhage along the anterior interhemispheric fissure with slightly increased extension into both lateral ventricles.  2. Slight increase in size of the temporal horn of the left lateral ventricle consistent with early communicating hydrocephalus.   MR BRAIN WO CONTRAST 05/20/2020 1. Early subacute infarct of the left basal ganglia involving the posterior putamen and caudate body.  2. Subarachnoid hemorrhage along the interhemispheric fissure extending into the lateral ventricles.  3. 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   CT  HEAD WO CONTRAST 05/21/2020 1. Stable appearance of left basal ganglia nonhemorrhagic infarction. 2. Stable medial left frontal extra-axial hemorrhage, extending into the ventricles bilaterally. 3. Subarachnoid hemorrhage posteriorly on the left and along the inter cerebral hemisphere likely represents redistribution without significant new hemorrhage. 4. Hemorrhage is present within the aqueduct of Sylvius. 5. Progressive dilation of the ventricles compatible with developing hydrocephalus. 6. No new areas of hemorrhage.   CT HEAD WO CONTRAST 05/22/2020 1. Regressed intraventricular hemorrhage since yesterday with small volume residual. Stable mild ventriculomegaly, and possible mild transependymal edema. 2. Stable left inferior frontal gyrus parasagittal hematoma (approximately 12 mL) and regional edema. No significant midline shift, basilar cisterns are patent. 3. Stable left basal ganglia infarcts. 4. No new intracranial abnormality.   CT HEAD WO CONTRAST 05/24/20 IMPRESSION: 1. 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 2. Unchanged intracranial hemorrhage with mild lateral ventriculomegaly.  CT HEAD WO CONTRAST 06/01/20 IMPRESSION: 1. Focal hematoma in the anterior interhemispheric fissure continues to become less dense. 2. Infarction in the left cingulate gyrus and left posterior putamen and caudate nucleus as seen previously. Question some infarction also of the left temporal lobe.  CT ABDOMEN AND PELVIS W CONTRAST 06/02/20 IMPRESSION: 1. No acute findings or explanation for the patient's symptoms. 2. Probable nonocclusive thrombus in the right femoral vein. Grossly stable known bilateral lower lobe pulmonary emboli, similar to recent chest CTA.  ECHOCARDIOGRAM COMPLETE 05/20/2020 1. Left ventricular ejection fraction, by estimation, is 60 to 65%. The left ventricle has normal function. The left ventricle has no regional wall motion abnormalities. Left ventricular diastolic  parameters were normal.   2. Right ventricular systolic function is normal. The right ventricular size is normal.   3. The mitral valve is normal in structure. Trivial mitral valve regurgitation. No evidence of mitral stenosis.   4. The aortic valve is normal in structure. Aortic valve regurgitation is not visualized. No aortic stenosis is present.   5. The inferior vena cava is normal in size with greater than 50% respiratory variability, suggesting right atrial pressure of 3 mmHg.   Bilateral Lower Extremity Venous Dopplers  05/21/2020 RIGHT: - There is no evidence of  deep vein thrombosis in the lower extremity. However, portions of this examination were limited- see technologist comments above.  - No cystic structure found in the popliteal fossa.   LEFT: - There is no evidence of deep vein thrombosis in the lower extremity. However, portions of this examination were limited- see technologist comments above.  - No cystic structure found in the popliteal fossa.    Transcranial Doppler w/ Bubble 05/25/2020 Not cooperative for Valsalva.  Bubble study showed Spencer degree 3 at rest.  Recommend TEE for further confirmation.  CT head 05/29/2020 :Interval decrease in the size/conspicuity of intraparenchymal, extra-axial, and intraventricular hemorrhage, as above. No new acute hemorrhage.  EEG 06/01/20 ABNORMALITY - Continuous slow, generalized and lateralized left hemisphere - Intermittent rhythmic delta slowing, left hemisphere, maximal left posterior quadrant IMPRESSION: This technically difficult study is suggestive of cortical dysfunction in left hemisphere, maximal left posterior quadrant.  Lateralized rhythmic delta activity was seen in left hemisphere which is on the ictal-interictal continuum with low potential for seizures.  Additionally, there is evidence of moderate diffuse encephalopathy, nonspecific etiology.  No seizures or epileptiform discharges were seen throughout the  recording.  PHYSICAL EXAM  Temp:  [98.9 F (37.2 C)-100.7 F (38.2 C)] 99.7 F (37.6 C) (09/05 0750) Pulse Rate:  [102-113] 113 (09/05 0750) Resp:  [18-20] 19 (09/05 0750) BP: (106-131)/(64-77) 106/64 (09/05 0750) SpO2:  [98 %-100 %] 98 % (09/05 0750)  General -mildly obese young African-American lady  Ophthalmologic - fundi not visualized due to noncooperation.   Cardiovascular - regular rhythm and mild tachycardia  Neuro - Awake alert with flat affect, eyes open, nonverbal not answering questions, she will raise eyebrows and shrug, able to repeat "I am here", but refused to repeat out her sentences.  Able to follow simple commands on both hands and left foot.  No gaze deviation, blinking to visual threat bilaterally, PERRL. Right facial droop. Tongue protrusion midline. LUE 5/5 and LLE 5/5. RUE 4+/5. RLE proximal 3 -/5, knee extension 3/5, distal PF/DF 1/5. Sensation, coordination not cooperative and gait not tested.   ASSESSMENT/PLAN Ms. Gail Montgomery is a 23 y.o. female with history of tobacco use, anxiety (panic attacks) and hormonal birth control presented with right-sided weakness and aphasia that started abruptly at 9:30 AM.  COVID - positive. IV t-PA - Saturday 05/19/20 at 1145. Thrombectomy - Dr Loreta Ave - Left M1 occlusion, final is TICI 3.   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 Paradoxical emboli in the setting of PFO 3. COVID hypercoagulability in the setting of OCP use and intermittent smoking - however, COVID Ab high titer    CT Head - Dense left ICA terminus and MCA. No acute hemorrhage. ASPECTS is 10.      CTA H&N - Emergent large vessel occlusion at the left ICA terminus continuing into the MCA. Mildly indistinct appearance of the proximal left ICA is likely from  motion artifact, consider cervical run. No atheromatous changes or vasculopathy seen in the neck.  IR - left MCA and A2 occlusion s/p TICI3 of MCA and TICI0 of ACA. Initially left ICA stenosis post IR but resolved on the final run  CT head 8/21 - New large volume of subarachnoid and intraventricular hemorrhage.   CT head 8/22 [facility] along the  anterior interhemispheric fissure with slightly increased extension into both lateral ventricles. Slight increase in size of the temporal horn of the left lateral ventricle consistent with early communicating hydrocephalus.   MRI head 8/22 -  Early subacute infarct of the left basal ganglia involving the posterior putamen and caudate body.   CT head 8/23 - stable L basal ganglia infarct, ICH/IVH/SAH. Progressive dilation of ventricles.  CT head 8/24 - regressed IVH. Stable infarct and frontal ICH. Stable ventriculomegaly  CT head 8/26 - stable infarcts & hemorrhage w/ mild ventriculomegaly   CT - 06/01/20 - frontal hematoma less dense. Stable left MCA infarct  CT Abd and Pelvis 06/02/20 - No acute findings or explanation for the patient's symptoms. Probable nonocclusive thrombus in the right femoral vein. Grossly stable known bilateral lower lobe pulmonary emboli, similar to recent chest CTA.  EEG - 06/01/20 Lateralized rhythmic delta activity was seen in left hemisphere which is on the ictal-interictal continuum with low potential for seizures.  2D Echo EF 60-65%  TCD bubble study Spencer degree 3 at rest.  Not cooperative for Valsalva.  Sars Corona Virus 2 - positive  LDL - 78  HgbA1c 5.6  UDS - THC positive  VTE prophylaxis - SCDs  No antithrombotic prior to admission, now on No antithrombotic due to [facility] and IVH -> Eliquis started 9/3   Ongoing aggressive stroke risk factor management  Therapy recommendations:  CIR   Disposition:  Pending   Hydrocephalus, mild  CT head showed mildly increased size of left temporal horn   CT repeat  stable hemorrhage but mild progression of hydrocephalus  CT head 8/24 - regressed IVH. Stable infarct and frontal ICH. Stable mild ventriculomegaly  NSG on board  3% hypertonic saline - 50 cc/hr ->25cc->10cc -> off  PICC placed 8/23   Repeat CT on 8/26 and 9/3 stable  DVT and PE  8/23 LE venous Doppler negative for DVT  8/30 LE venous Doppler showed acute right LE DVT   8/30 CTA chest bilateral segmental lower lobe PE, left greater than right  Was on heparin IV, transitioned to Eliquis 9/3  PFO  TCD bubble study showed Spencer degree 3 at rest.  Not cooperative with Valsalva  Plan for TEE next Wednesday  If PFO confirmed, consider to refer to Dr. Excell Seltzer cardiology for PFO closure.  BP management  Stable  On Nimodipine for [facility] - discussed with Dr. Conchita Paris, will keep nimodipine while inpt - d/c on discharge if neuro stable  SBP goal < 160 . Long-term BP goal normotensive  Hyperlipidemia  Home Lipid lowering medication: none   LDL 78, goal < 70  On Lipitor 20  Continue statin at discharge  ? Hypercoagulable state   COVID positive - asymptomatic  D-dimer > 20->16.13->17.44-> >20-> >20->12.89->6.37->5.4->6.02->5.86  OCP user with smoking and THC use  Hypercoagulable work up positive ANCA proteinase 3 - 6.0 (H) (0.0 - 3.5) and positive ANA  Repeat above as outpatient  COVID - 19 infection  COVID + test 8/21  Asymptomatic  Ferritin 24->30->37->51->93->92->12 5  Fibrinogen 289  CRP 0.8->2.4->3.3->3.9->8.7->9.1->28.8->17.3->13.5->11.5  D-dimer > 20->16.13->17.44-> >20-> >20 ->12.89->6.37->5.4->6.02->5.86  COVID Ab titer > 2500  off isolation now per ID  Leukocytosis, Fever, tachycardia  Etiology unclear - may be due to PEs  TMax 100.7  WBC 10.9->12.2->12.5->11.0->14.0->13.9->17.0...11.8->8.3->8.2  HR 100-120s  UA small LE, rare bact, 0-5 WBC  CXR neg  Urine culture - 8/30 E. Coli   Blood Cx - 8/25  And 8/30 all neg  Blood  Cultures 9/5 - pending  Vanco and zosyn empiric treatment 8/25>>8/27  Rocephin 9/2 >>9/4  Euthyroid sick syndrome  Low TSH 0.054   normal free T4 and T3  Likely related to critical illness  Tobacco abuse  Intermittent smoking per mom  Smoking cessation counseling will be provided  Other Stroke Risk Factors  ETOH use, advised to drink no more than 1 alcoholic beverage per day  Obesity, Body mass index is 32.2 kg/m., recommend weight loss, diet and exercise as appropriate   Hormonal birth control - both progesterone and estrogen - Estarylla  Other Active Problems  Code status - Full code  Low normal B12 level 300 - supplement  Intermittent diarrhea - imodium PRN  Hospital day # 15   Marvel Plan, MD PhD Stroke Neurology 06/03/2020 3:33 PM    To contact Stroke Continuity provider, please refer to [email or web address]. After hours, contact General Neurology

## 2020-06-04 DIAGNOSIS — I824Y9 Acute embolism and thrombosis of unspecified deep veins of unspecified proximal lower extremity: Secondary | ICD-10-CM

## 2020-06-04 LAB — COMPREHENSIVE METABOLIC PANEL
ALT: 102 U/L — ABNORMAL HIGH (ref 0–44)
AST: 64 U/L — ABNORMAL HIGH (ref 15–41)
Albumin: 2.1 g/dL — ABNORMAL LOW (ref 3.5–5.0)
Alkaline Phosphatase: 52 U/L (ref 38–126)
Anion gap: 11 (ref 5–15)
BUN: <5 mg/dL — ABNORMAL LOW (ref 6–20)
CO2: 22 mmol/L (ref 22–32)
Calcium: 8.8 mg/dL — ABNORMAL LOW (ref 8.9–10.3)
Chloride: 99 mmol/L (ref 98–111)
Creatinine, Ser: 0.56 mg/dL (ref 0.44–1.00)
GFR calc Af Amer: >60 mL/min (ref >60)
GFR calc non Af Amer: >60 mL/min (ref >60)
Glucose, Bld: 107 mg/dL — ABNORMAL HIGH (ref 70–99)
Potassium: 4.3 mmol/L (ref 3.5–5.1)
Sodium: 132 mmol/L — ABNORMAL LOW (ref 135–145)
Total Bilirubin: 0.5 mg/dL (ref 0.3–1.2)
Total Protein: 7.8 g/dL (ref 6.5–8.1)

## 2020-06-04 LAB — CBC
HCT: 29.5 % — ABNORMAL LOW (ref 36.0–46.0)
Hemoglobin: 9.7 g/dL — ABNORMAL LOW (ref 12.0–15.0)
MCH: 29 pg (ref 26.0–34.0)
MCHC: 32.9 g/dL (ref 30.0–36.0)
MCV: 88.1 fL (ref 80.0–100.0)
Platelets: 547 10*3/uL — ABNORMAL HIGH (ref 150–400)
RBC: 3.35 MIL/uL — ABNORMAL LOW (ref 3.87–5.11)
RDW: 12.1 % (ref 11.5–15.5)
WBC: 7.2 10*3/uL (ref 4.0–10.5)
nRBC: 0 % (ref 0.0–0.2)

## 2020-06-04 LAB — MAGNESIUM: Magnesium: 1.9 mg/dL (ref 1.7–2.4)

## 2020-06-04 LAB — C-REACTIVE PROTEIN: CRP: 9.4 mg/dL — ABNORMAL HIGH (ref <1.0)

## 2020-06-04 LAB — BRAIN NATRIURETIC PEPTIDE: B Natriuretic Peptide: 21.4 pg/mL (ref 0.0–100.0)

## 2020-06-04 LAB — PROTIME-INR
INR: >10 (ref 0.8–1.2)
Prothrombin Time: >90 seconds — ABNORMAL HIGH (ref 11.4–15.2)

## 2020-06-04 LAB — D-DIMER, QUANTITATIVE: D-Dimer, Quant: 5.68 ug/mL-FEU — ABNORMAL HIGH (ref 0.00–0.50)

## 2020-06-04 LAB — PROCALCITONIN: Procalcitonin: <0.1 ng/mL

## 2020-06-04 MED ORDER — APIXABAN 5 MG PO TABS: 5.0000 mg | ORAL_TABLET | Freq: Two times a day (BID) | ORAL | Status: DC

## 2020-06-04 MED ORDER — APIXABAN 5 MG PO TABS
5.0000 mg | ORAL_TABLET | Freq: Two times a day (BID) | ORAL | Status: DC
  Administered 2020-06-04 – 2020-06-07 (×7): 5 mg via ORAL
  Filled 2020-06-04 (×7): qty 1

## 2020-06-04 MED ORDER — APIXABAN 5 MG PO TABS: 10.0000 mg | ORAL_TABLET | Freq: Two times a day (BID) | ORAL | Status: DC

## 2020-06-04 NOTE — Progress Notes (Signed)
STROKE TEAM PROGRESS NOTE   INTERVAL HISTORY Mom at bedside. Pt sitting in chair. Per mom, pt had some spontaneous speech today. She was able to order her dinner over the phone. She was able to tell me what she ordered tonight, but no other spontaneous speech with me. She walked with right leg brace for 8 steps today. Pending TEE and CIR placement.   OBJECTIVE Vitals:   06/03/20 1638 06/03/20 2129 06/04/20 0128 06/04/20 0801  BP: 125/79 125/76  104/75  Pulse: 97 (!) 104  (!) 134  Resp: Temp: (!) 97.4 F (36.3 C) (!) 100.6 F (38.1 C) 97.9 F (36.6 C) 98.4 F (36.9 C)  TempSrc:  Oral    SpO2: 100% 99%  99%  Weight:      Height:       CBC:  Recent Labs  Lab 06/03/20 0326 06/04/20 0114  WBC 8.2 7.2  HGB 9.4* 9.7*  HCT 29.1* 29.5*  MCV 87.1 88.1  PLT 533* 547*   Basic Metabolic Panel:  Recent Labs  Lab 05/29/20 0055 05/30/20 0317 06/03/20 0326 06/04/20 0114  NA 130*   < > 127* 132*  K 3.8   < > 4.1 4.3  CL 98   < > 95* 99  CO2 21*   < > 21* 22  GLUCOSE 104*   < > 108* 107*  BUN 9   < > <5* <5*  CREATININE 0.57   < > 0.50 0.56  CALCIUM 8.4*   < > 8.9 8.8*  MG 2.1   < > 2.0 1.9  PHOS 3.8  --   --   --    < > = values in this interval not displayed.   Lipid Panel:     Component Value Date/Time   CHOL 156 05/20/2020 0958   TRIG 105 05/20/2020 0958   TRIG 104 05/20/2020 0958   HDL 57 05/20/2020 0958   CHOLHDL 2.7 05/20/2020 0958   VLDL 21 05/20/2020 0958   LDLCALC 78 05/20/2020 0958   HgbA1c:  Lab Results  Component Value Date   HGBA1C 5.6 05/20/2020   Urine Drug Screen:     Component Value Date/Time   LABOPIA NONE DETECTED 05/20/2020 1736   COCAINSCRNUR NONE DETECTED 05/20/2020 1736   LABBENZ NONE DETECTED 05/20/2020 1736   AMPHETMU NONE DETECTED 05/20/2020 1736   THCU POSITIVE (A) 05/20/2020 1736   LABBARB NONE DETECTED 05/20/2020 1736    Alcohol Level No results found for: [facility]  IMAGING  CT HEAD CODE STROKE WO  CONTRAST 05/19/2020 Dense left ICA terminus and MCA. No acute hemorrhage. ASPECTS is 10.   CT Code Stroke CTA Head W/WO contrast CT Code Stroke CTA Neck W/WO contrast 05/19/2020 1. 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  2. Mildly indistinct appearance of the proximal left ICA is likely from motion artifact, consider cervical run. No atheromatous changes or vasculopathy seen in the neck.   Neuro Interventional Radiology - Cerebral Angiogram with Intervention - Dr Loreta Ave 05/19/20 2:27 PM Left MCA: Baseline TICI 0 First Pass Device:                 Local aspiration and solitaire 4 x 40              Result                         TICI 0 Second Pass Device:  Local aspiration and solitaire 4 x 40              Result                         TICI 3 Left ACA/pericallosal  Baseline TICI 0,    Final TICI 0 pericallosal artery Findings:  Patent right CFA Left M1 occlusion, final is TICI 3  Left ACA A2 occlusion migrated to pericallosal artery during the case.  Could not complete a first pass safely.  Significant irregularity of the cervical ICA, concerning for dissection, that resolved after observation, and once DAPT was initiated.  The final appearance may represent spasm vs FMD/vasculitis  CT HEAD WO CONTRAST 05/19/2020 New large volume of subarachnoid and intraventricular hemorrhage.  CT HEAD WO CONTRAST 05/20/2020 1. Subarachnoid hemorrhage along the anterior interhemispheric fissure with slightly increased extension into both lateral ventricles.  2. Slight increase in size of the temporal horn of the left lateral ventricle consistent with early communicating hydrocephalus.   MR BRAIN WO CONTRAST 05/20/2020 1. Early subacute infarct of the left basal ganglia involving the posterior putamen and caudate body.  2. Subarachnoid hemorrhage along the interhemispheric fissure extending into the lateral ventricles.  3. Early communicating  hydrocephalus as evidenced by slightly increased size of the temporal horns.   CT HEAD WO CONTRAST 05/21/2020 1. Stable appearance of left basal ganglia nonhemorrhagic infarction. 2. Stable medial left frontal extra-axial hemorrhage, extending into the ventricles bilaterally. 3. Subarachnoid hemorrhage posteriorly on the left and along the inter cerebral hemisphere likely represents redistribution without significant new hemorrhage. 4. Hemorrhage is present within the aqueduct of Sylvius. 5. Progressive dilation of the ventricles compatible with developing hydrocephalus. 6. No new areas of hemorrhage.   CT HEAD WO CONTRAST 05/22/2020 1. Regressed intraventricular hemorrhage since yesterday with small volume residual. Stable mild ventriculomegaly, and possible mild transependymal edema. 2. Stable left inferior frontal gyrus parasagittal hematoma (approximately 12 mL) and regional edema. No significant midline shift, basilar cisterns are patent. 3. Stable left basal ganglia infarcts. 4. No new intracranial abnormality.   CT HEAD WO CONTRAST 05/24/20 IMPRESSION: 1. 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 2. Unchanged intracranial hemorrhage with mild lateral ventriculomegaly.  CT HEAD WO CONTRAST 06/01/20 IMPRESSION: 1. Focal hematoma in the anterior interhemispheric fissure continues to become less dense. 2. Infarction in the left cingulate gyrus and left posterior putamen and caudate nucleus as seen previously. Question some infarction also of the left temporal lobe.  CT ABDOMEN AND PELVIS W CONTRAST 06/02/20 IMPRESSION: 1. No acute findings or explanation for the patient's symptoms. 2. Probable nonocclusive thrombus in the right femoral vein. Grossly stable known bilateral lower lobe pulmonary emboli, similar to recent chest CTA.  ECHOCARDIOGRAM COMPLETE 05/20/2020 1. Left ventricular ejection fraction, by estimation, is 60 to 65%. The left ventricle has normal function. The left  ventricle has no regional wall motion abnormalities. Left ventricular diastolic parameters were normal.   2. Right ventricular systolic function is normal. The right ventricular size is normal.   3. The mitral valve is normal in structure. Trivial mitral valve regurgitation. No evidence of mitral stenosis.   4. The aortic valve is normal in structure. Aortic valve regurgitation is not visualized. No aortic stenosis is present.   5. The inferior vena cava is normal in size with greater than 50% respiratory variability, suggesting right atrial pressure of 3 mmHg.   Bilateral Lower Extremity Venous Dopplers  05/21/2020 RIGHT: - There  is no evidence of deep vein thrombosis in the lower extremity. However, portions of this examination were limited- see technologist comments above.  - No cystic structure found in the popliteal fossa.   LEFT: - There is no evidence of deep vein thrombosis in the lower extremity. However, portions of this examination were limited- see technologist comments above.  - No cystic structure found in the popliteal fossa.    Transcranial Doppler w/ Bubble 05/25/2020 Not cooperative for Valsalva.  Bubble study showed Spencer degree 3 at rest.  Recommend TEE for further confirmation.  CT head 05/29/2020 :Interval decrease in the size/conspicuity of intraparenchymal, extra-axial, and intraventricular hemorrhage, as above. No new acute hemorrhage.  EEG 06/01/20 ABNORMALITY - Continuous slow, generalized and lateralized left hemisphere - Intermittent rhythmic delta slowing, left hemisphere, maximal left posterior quadrant IMPRESSION: This technically difficult study is suggestive of cortical dysfunction in left hemisphere, maximal left posterior quadrant.  Lateralized rhythmic delta activity was seen in left hemisphere which is on the ictal-interictal continuum with low potential for seizures.  Additionally, there is evidence of moderate diffuse encephalopathy, nonspecific etiology.   No seizures or epileptiform discharges were seen throughout the recording.  PHYSICAL EXAM    Temp:  [97.4 F (36.3 C)-100.6 F (38.1 C)] 98.4 F (36.9 C) (09/06 0801) Pulse Rate:  [97-134] 134 (09/06 0801) Resp:  [16-19] 16 (09/06 0801) BP: (104-125)/(75-79) 104/75 (09/06 0801) SpO2:  [99 %-100 %] 99 % (09/06 0801)  General -mildly obese young African-American lady  Ophthalmologic - fundi not visualized due to noncooperation.   Cardiovascular - regular rhythm and mild tachycardia  Neuro - Awake alert with flat affect, eyes open, largely nonverbal but able to tell me what she ordered for dinner, however did not say anything else.  Able to follow simple commands on both hands and left foot.  No gaze deviation, blinking to visual threat bilaterally, PERRL. Right facial droop. Tongue protrusion midline. LUE 5/5 and LLE 5/5. RUE 4+/5. RLE proximal 3 -/5, knee extension 3/5, distal PF/DF 1/5, on brace. Sensation, coordination not cooperative and gait not tested.   ASSESSMENT/PLAN Gail Montgomery is a [age] y.o. female with history of tobacco use, anxiety (panic attacks) and hormonal birth control presented with right-sided weakness and aphasia that started abruptly at 9:30 AM.  COVID - positive. IV t-PA - Saturday 05/19/20 at 1145. Thrombectomy - Dr Loreta AveWagner - Left M1 occlusion, final is TICI 3.   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 Paradoxical emboli in the setting of PFO 3. COVID hypercoagulability in the setting of OCP use and intermittent smoking - however, COVID Ab high titer    CT Head - Dense left ICA terminus and MCA. No acute hemorrhage. ASPECTS is 10.      CTA H&N - Emergent large vessel occlusion at the left ICA terminus continuing into the MCA. Mildly indistinct appearance of  the proximal left ICA is likely from motion artifact, consider cervical run. No atheromatous changes or vasculopathy seen in the neck.  IR - left MCA and A2 occlusion s/p TICI3 of MCA and TICI0 of ACA. Initially left ICA stenosis post IR but resolved on the final run  CT head 8/21 - New large volume of subarachnoid and intraventricular hemorrhage.   CT head 8/22 -  SAH along the anterior interhemispheric fissure with slightly increased extension into both lateral ventricles. Slight increase in size of the temporal horn of the left lateral ventricle consistent with early communicating hydrocephalus.   MRI head 8/22 -  Early subacute infarct of the left basal ganglia involving the posterior putamen and caudate body.   CT head 8/23 - stable L basal ganglia infarct, ICH/IVH/SAH. Progressive dilation of ventricles.  CT head 8/24 - regressed IVH. Stable infarct and frontal ICH. Stable ventriculomegaly  CT head 8/26 - stable infarcts & hemorrhage w/ mild ventriculomegaly   CT - 06/01/20 - frontal hematoma less dense. Stable left MCA infarct  CT Abd and Pelvis 06/02/20 - No acute findings or explanation for the patient's symptoms. Probable nonocclusive thrombus in the right femoral vein. Grossly stable known bilateral lower lobe pulmonary emboli, similar to recent chest CTA.  EEG - 06/01/20 Lateralized rhythmic delta activity was seen in left hemisphere which is on the ictal-interictal continuum with low potential for seizures.  2D Echo EF 60-65%  TCD bubble study Spencer degree 3 at rest.  Not cooperative for Valsalva.  Sars Corona Virus 2 - positive  LDL - 78  HgbA1c 5.6  UDS - THC positive  VTE prophylaxis - SCDs  No antithrombotic prior to admission, now on No antithrombotic due to [facility] and IVH -> Eliquis started 9/3   Ongoing aggressive stroke risk factor management  Therapy recommendations:  CIR   Disposition:  Pending   Hydrocephalus, mild  CT head showed mildly increased size of  left temporal horn   CT repeat stable hemorrhage but mild progression of hydrocephalus  CT head 8/24 - regressed IVH. Stable infarct and frontal ICH. Stable mild ventriculomegaly  NSG on board  3% hypertonic saline - 50 cc/hr ->25cc->10cc -> off  PICC placed 8/23   Repeat CT on 8/26 and 9/3 stable  DVT and PE  8/23 LE venous Doppler negative for DVT  8/30 LE venous Doppler showed acute right LE DVT   8/30 CTA chest bilateral segmental lower lobe PE, left greater than right  Was on heparin IV, transitioned to Eliquis 9/3  INR 1.2->>10.0  PFO  TCD bubble study showed Spencer degree 3 at rest.  Not cooperative with Valsalva  Plan for TEE Wednesday 9/8 at 1330  If PFO confirmed, consider to refer to Dr. Excell Seltzer cardiology for PFO closure.  BP management  Stable  On Nimodipine for [facility] - discussed with Dr. Conchita Paris, will keep nimodipine while inpt - d/c on discharge if neuro stable  SBP goal < 160 . Long-term BP goal normotensive  Hyperlipidemia  Home Lipid lowering medication: none   LDL 78, goal < 70  On Lipitor 20  Continue statin at discharge  ? Hypercoagulable state   COVID positive - asymptomatic  D-dimer > 20->16.13->17.44-> >20-> >20->12.89->6.37->5.4->6.02->5.86->5.68  OCP user with smoking and THC use  Hypercoagulable work up positive ANCA proteinase 3 - 6.0 (H) (0.0 - 3.5) and positive ANA  Repeat above as outpatient  COVID - 19 infection  COVID + test 8/21  Asymptomatic  Ferritin 24->30->37->51->93->92->12 5  Fibrinogen 289  CRP 0.8->2.4->3.3->3.9->8.7->9.1->28.8->17.3->13.5->11.5->9.4  D-dimer > 20->16.13->17.44-> >20-> >20 ->12.89->6.37->5.4->6.02->5.86->5.68  COVID Ab titer > 2500  off isolation now per ID  Leukocytosis, Fever, tachycardia  Etiology unclear - may be due to PEs  TMax 100.6  WBC 10.9->12.2->12.5->11.0->14.0->13.9->17.0...11.8->8.3->8.2->7.2->7.2  HR 100-120s  UA small LE, rare bact, 0-5 WBC  CXR  neg  Urine culture - 8/30 E. Coli   Blood Cx -  8/25  And 8/30 all neg  Blood Cultures 9/5 - no growth 1 d  Vanco and zosyn empiric treatment 8/25>>8/27  Rocephin 9/2 >>9/4  Euthyroid sick syndrome  Low TSH 0.054   normal free T4 and T3  Likely related to critical illness  Tobacco abuse  Intermittent smoking per mom  Smoking cessation counseling will be provided  Other Stroke Risk Factors  ETOH use, advised to drink no more than 1 alcoholic beverage per day  Obesity, Body mass index is 32.2 kg/m., recommend weight loss, diet and exercise as appropriate   Hormonal birth control - both progesterone and estrogen - Estarylla  Other Active Problems  Code status - Full code  Low normal B12 level 300 - supplement  Intermittent diarrhea - imodium PRN  SIADH d/t stroke 126->127->132  Hospital day # 16    Marvel Plan, MD PhD Stroke Neurology 06/04/2020 12:24 PM    To contact Stroke Continuity provider, please refer to [email or web address]. After hours, contact General Neurology

## 2020-06-04 NOTE — Progress Notes (Signed)
Temp-100.00 , Tylenol 500 mg given as ordered. Physician on call notified.

## 2020-06-04 NOTE — Progress Notes (Signed)
PROGRESS NOTE  Gail JanKeren Montgomery WJX:[id number] DOB: 07/24/1996 DOA: 05/19/2020 PCP: No primary care provider on file.   LOS: 16 days   Brief Narrative / Interim history: 24 year old female who was admitted to the hospital on 05/19/2020 with sudden onset of right hemiparesis and aphasia at 9:30 in the morning. She presented as a code stroke with acute left MCA syndrome. She received TPA at 11:29 AM, she was deemed a candidate for thrombectomy and was taken to IR status post successful L MCA opening with reperfusion, but L ACA had a residual small distal anterior cerebral clot. During procedure, there was an ICA vasospasm versus dissection, initially it was thought that she needs a stent however upon reimaging the vessel was open. She was loaded with aspirin and Plavix. Incidentally she was found to be Covid positive but was asymptomatic. Her hospital course [facility], parenchymal hemorrhage and intraventricular hemorrhage. In addition, during her hospital stay she has been having intermittent fevers since 8/24, associated with sinus tachycardia. She was on antibiotics on 2 occasions, vancomycin and Zosyn-ICU 8/25-8/27, and also ceftriaxone for an E. coli UTI. Currently she is off antibiotics. She was also found to have a DVT/PE and was started on heparin drip and eventually transitioned to Eliquis on 9/3. 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  Subjective / 24h Interval events: Alert, pleasant this morning, nonverbal. She appears to have good understanding and nods appropriately yes and no to my questions. 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 She continues to be aphasic. She has had full stroke work-up and  neurology is following, transcranial Doppler with bubble study suggestive of possible PFO. Given PE and DVT it is possible that she had paradoxical embolic infarction with PFO. Repeat CT scan on 06/01/2020 shows improvement in ICH. EEG remained nonacute. She is currently on nimodipine with goal systolic blood pressure under 213160. She was also started on a statin due to LDL of 78. A TEE currently is pending.  Active Problems Possible PFO-TEE by cardiology pending  Right lower extremity acute DVT and bilateral acute PE-this is likely multifactorial due to oral contraceptives, obesity, long flight prior to her sickness. She was anticoagulated with heparin now transitioned to Eliquis  Persistent low-grade fevers-possibly related to blood clot burden, she did have evidence of E. coli UTI versus bacteriuria status post 3 days of Rocephin completed. CT abdomen pelvis negative, multiple sets of blood cultures have remained negative as well.  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  Covid positive-incidental, did not have any respiratory symptoms of off contact precautions  Scheduled Meds: . apixaban  5 mg Oral BID  . atorvastatin  20 mg Oral Daily  . Chlorhexidine Gluconate Cloth  6 each Topical Daily  . folic acid  1 mg Oral Daily  . mouth rinse  15 mL Mouth Rinse BID  . multivitamin  1 tablet Oral Daily  . niMODipine  60 mg Oral Q4H  . pantoprazole  40 mg Oral Daily  . thiamine  100 mg Oral Daily  . vitamin B-12  500 mcg Oral Daily   Continuous Infusions: PRN Meds:.acetaminophen **OR** [DISCONTINUED] acetaminophen (TYLENOL) oral liquid 160 mg/5 mL **OR** [  DISCONTINUED] acetaminophen, docusate, loperamide, polyethylene glycol, sodium chloride flush  Diet Orders (From admission, onward)    Start     Ordered   05/23/20 1510  DIET DYS 2 Room service appropriate? Yes with Assist; Fluid consistency: Thin  Diet effective now         Comments: Full supervision. Meds crushed in puree.  Question Answer Comment  Room service appropriate? Yes with Assist   Fluid consistency: Thin      05/23/20 1510          DVT prophylaxis: SCD's Start: 05/19/20 1205 apixaban (ELIQUIS) tablet 5 mg     Code Status: Full Code  Family Communication: mother at bedside   Status is: Inpatient  Remains inpatient appropriate because:Ongoing diagnostic testing needed not appropriate for outpatient work up and Inpatient level of care appropriate due to severity of illness   Dispo: The patient is from: Home              Anticipated d/c is to: CIR              Anticipated d/c date is: 2 days              Patient currently is not medically stable to d/c.  Consultants:  Neurology  Cardiology   Procedures:  EEG - Thistechnically difficultstudyissuggestive of cortical dysfunction in left hemisphere, maximal left posterior quadrant.Lateralized rhythmic delta activity was seen in left hemisphere which is on the ictal-interictal continuum with low potential for seizures. Additionally, there is evidence of moderate diffuse encephalopathy, nonspecific etiology. No seizures or epileptiform discharges were seen throughout the recording.   CTA - 1. Bilateral segmental LOWER lobe pulmonary emboli, LEFT-greater-than-RIGHT. Equivocal segmental emboli within the LEFT UPPER lobe. 2. Mild cardiomegaly.   CT HEAD CODE STROKE WO CONTRAST 05/19/2020 Dense left ICA terminus and MCA. No acute hemorrhage. ASPECTS is 10.   CT Code Stroke CTA Head W/WO contrast CT Code Stroke CTA Neck W/WO contrast 05/19/2020 1. 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  2. Mildly indistinct appearance of the proximal left ICA is likely from motion artifact, consider cervical run. No atheromatous changes or vasculopathy seen in the neck.   Neuro Interventional Radiology - Cerebral Angiogram with Intervention - Dr  Loreta Ave 05/19/20 2:27 PM Left ZOX:[id number] TICI 0 First Pass Device:Local aspiration and solitaire 4 x 40 ResultTICI 0 SecondPass Device:Local aspiration and solitaire 4 x 40 ResultTICI 3 Left ACA/pericallosal  Baseline TICI 0, Final TICI 0 pericallosal artery Findings:Patent right CFA Left M1 occlusion, final is TICI 3  Left ACA A2 occlusion migrated to pericallosal artery during the case. Could not complete a first pass safely.  Significant irregularity of the cervical ICA, concerning for dissection, that resolved after observation, and once DAPT was initiated. The final appearance may represent spasm vs FMD/vasculitis  CT HEAD WO CONTRAST 05/19/2020 New large volume of subarachnoid and intraventricular hemorrhage.  CT HEAD WO CONTRAST 05/20/2020 1. Subarachnoid hemorrhage along the anterior interhemispheric fissure with slightly increased extension into both lateral ventricles.  2. Slight increase in size of the temporal horn of the left lateral ventricle consistent with early communicating hydrocephalus.   MR BRAIN WO CONTRAST 05/20/2020 1. Early subacute infarct of the left basal ganglia involving the posterior putamen and caudate body.  2. Subarachnoid hemorrhage along the interhemispheric fissure extending into the lateral ventricles.  3. 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   CT HEAD WO CONTRAST  05/21/2020 1. Stable appearance of left basal ganglia nonhemorrhagic infarction. 2. Stable medial left frontal extra-axial hemorrhage, extending into the ventricles bilaterally. 3. Subarachnoid hemorrhage posteriorly on the left and along the inter cerebral hemisphere likely represents redistribution without significant new hemorrhage. 4. Hemorrhage is present within the aqueduct of Sylvius. 5. Progressive dilation of  the ventricles compatible with developing hydrocephalus. 6. No new areas of hemorrhage.   CT HEAD WO CONTRAST 05/22/2020 1. Regressed intraventricular hemorrhage since yesterday with small volume residual. Stable mild ventriculomegaly, and possible mild transependymal edema. 2. Stable left inferior frontal gyrus parasagittal hematoma (approximately 12 mL) and regional edema. No significant midline shift, basilar cisterns are patent. 3. Stable left basal ganglia infarcts. 4. No new intracranial abnormality.   CT HEAD WO CONTRAST 05/24/20 IMPRESSION: 1. 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 2. Unchanged intracranial hemorrhage with mild lateral ventriculomegaly.  ECHOCARDIOGRAM COMPLETE 05/20/2020 1. Left ventricular ejection fraction, by estimation, is 60 to 65%. The left ventricle has normal function. The left ventricle has no regional wall motion abnormalities. Left ventricular diastolic parameters were normal.  2. Right ventricular systolic function is normal. The right ventricular size is normal.  3. The mitral valve is normal in structure. Trivial mitral valve regurgitation. No evidence of mitral stenosis.  4. The aortic valve is normal in structure. Aortic valve regurgitation is not visualized. No aortic stenosis is present.  5. The inferior vena cava is normal in size with greater than 50% respiratory variability, suggesting right atrial pressure of 3 mmHg.   Bilateral Lower Extremity Venous Dopplers  05/21/2020 RIGHT: - There is no evidence of deep vein thrombosis in the lower extremity. However, portions of this examination were limited- see technologist comments above. - No cystic structure found in the popliteal fossa.  LEFT: - There is no evidence of deep vein thrombosis in the lower extremity. However, portions of this examination were limited- see technologist comments above. - No cystic structure found in the popliteal fossa.   Transcranial Doppler w/  Bubble 05/25/2020 Not cooperative for Valsalva. Bubble study showed Spencer degree 3 at rest. Recommend TEE for further confirmation.  Microbiology  Blood cultures-negative to date Urine cultures 8/30-E. coli resistant to ampicillin  Antimicrobials: Vancomycin and Zosyn while in ICU Ceftriaxone 9/2-9/4   Objective: Vitals:   06/03/20 1638 06/03/20 2129 06/04/20 0128 06/04/20 0801  BP: 125/79 125/76  104/75  Pulse: 97 (!) 104  (!) 134  Resp: 19 18  16   Temp: (!) 97.4 F (36.3 C) (!) 100.6 F (38.1 C) 97.9 F (36.6 C) 98.4 F (36.9 C)  TempSrc:  Oral    SpO2: 100% 99%  99%  Weight:      Height:        Intake/Output Summary (Last 24 hours) at 06/04/2020 0957 Last data filed at 06/03/2020 1900 Gross per 24 hour  Intake 240 ml  Output 1450 ml  Net -1210 ml   Filed Weights   05/19/20 1100 05/29/20 0400  Weight: 84.2 kg 85.1 kg    Examination:  Constitutional: NAD Eyes: no scleral icterus ENMT: Mucous membranes are moist.  Neck: normal, supple Respiratory: clear to auscultation bilaterally, no wheezing, no crackles. Normal respiratory effort. No accessory muscle use.  Cardiovascular: Regular rate and rhythm, no murmurs / rubs / gallops. No LE edema. Good peripheral pulses Abdomen: non distended, no tenderness. Bowel sounds positive.  Musculoskeletal: no clubbing / cyanosis.  Skin: no rashes Neurologic: Weak on the right side, lower extremity more than upper extremity,  normal strength on left.   Data Reviewed: I have independently reviewed following labs and imaging studies   CBC: Recent Labs  Lab 05/31/20 0450 06/01/20 0325 06/02/20 0101 06/03/20 0326 06/04/20 0114  WBC 10.5 11.8* 8.3 8.2 7.2  HGB 9.8* 9.8* 8.9* 9.4* 9.7*  HCT 30.5* 29.5* 27.4* 29.1* 29.5*  MCV 87.6 87.8 86.7 87.1 88.1  PLT 412* 512* 485* 533* 547*   Basic Metabolic Panel: Recent Labs  Lab 05/29/20 0055 05/30/20 0317 05/31/20 0828 06/01/20 0325 06/02/20 0101 06/03/20 0326  06/04/20 0114  NA 130*   < > 129* 131* 126* 127* 132*  K 3.8   < > 3.7 4.3 3.5 4.1 4.3  CL 98   < > 99 97* 96* 95* 99  CO2 21*   < > 19* 21* 19* 21* 22  GLUCOSE 104*   < > 102* 97 119* 108* 107*  BUN 9   < > 9 7 7  <5* <5*  CREATININE 0.57   < > 0.51 0.58 0.54 0.50 0.56  CALCIUM 8.4*   < > 8.4* 8.8* 8.6* 8.9 8.8*  MG 2.1  --  2.0 2.0 1.8 2.0 1.9  PHOS 3.8  --   --   --   --   --   --    < > = values in this interval not displayed.   Liver Function Tests: Recent Labs  Lab 05/30/20 0317 06/01/20 0325 06/02/20 0101 06/03/20 0326 06/04/20 0114  AST 82* 83* 77* 96* 64*  ALT 72* 90* 92* 115* 102*  ALKPHOS 53 46 45 52 52  BILITOT 0.7 0.4 0.3 0.4 0.5  PROT 8.3* 7.6 7.2 7.7 7.8  ALBUMIN 2.3* 2.2* 2.1* 2.2* 2.1*   Coagulation Profile: Recent Labs  Lab 05/31/20 0450 06/01/20 0325 06/02/20 0101 06/03/20 0326 06/04/20 0114  INR 1.1 1.2 1.3* 1.2 >10.0*   HbA1C: No results for input(s): HGBA1C in the last 72 hours. CBG: Recent Labs  Lab 05/31/20 0748 05/31/20 1944 05/31/20 2324 06/01/20 0353  GLUCAP 101* 100* 111* 90    Recent Results (from the past 240 hour(s))  Culture, blood (routine x 2)     Status: None   Collection Time: 05/28/20  4:39 AM   Specimen: BLOOD RIGHT HAND  Result Value Ref Range Status   Specimen Description BLOOD RIGHT HAND  Final   Special Requests   Final    BOTTLES DRAWN AEROBIC ONLY Blood Culture results may not be optimal due to an inadequate volume of blood received in culture bottles   Culture   Final    NO GROWTH 5 DAYS Performed at [facility], [address], [place], [place]    Report Status 06/02/2020 FINAL  Final  Culture, blood (routine x 2)     Status: None   Collection Time: 05/28/20  4:39 AM   Specimen: BLOOD  Result Value Ref Range Status   Specimen Description BLOOD RIGHT ARM  Final   Special Requests   Final    BOTTLES DRAWN AEROBIC ONLY Blood Culture results may not be optimal due to an inadequate volume of  blood received in culture bottles   Culture   Final    NO GROWTH 5 DAYS Performed at [facility], [address] [place], [place]    Report Status 06/02/2020 FINAL  Final  Culture, Urine     Status: Abnormal   Collection Time: 05/28/20  1:51 PM   Specimen: Urine, Random  Result Value  Ref Range Status   Specimen Description URINE, RANDOM  Final   Special Requests   Final    NONE Performed at [facility], [address] [place], [place] [address]    Culture >=100,000 COLONIES/mL ESCHERICHIA COLI (A)  Final   Report Status 05/30/2020 FINAL  Final   Organism ID, Bacteria ESCHERICHIA COLI (A)  Final      Susceptibility   Escherichia coli - MIC*    AMPICILLIN >=32 RESISTANT Resistant     CEFAZOLIN 16 SENSITIVE Sensitive     CEFTRIAXONE <=0.25 SENSITIVE Sensitive     CIPROFLOXACIN <=0.25 SENSITIVE Sensitive     GENTAMICIN >=16 RESISTANT Resistant     IMIPENEM <=0.25 SENSITIVE Sensitive     NITROFURANTOIN <=16 SENSITIVE Sensitive     TRIMETH/SULFA <=20 SENSITIVE Sensitive     AMPICILLIN/SULBACTAM >=32 RESISTANT Resistant     PIP/TAZO <=4 SENSITIVE Sensitive     * >=100,000 COLONIES/mL ESCHERICHIA COLI  Culture, blood (routine x 2)     Status: None (Preliminary result)   Collection Time: 06/03/20  3:30 AM   Specimen: BLOOD  Result Value Ref Range Status   Specimen Description BLOOD LEFT ANTECUBITAL  Final   Special Requests   Final    BOTTLES DRAWN AEROBIC AND ANAEROBIC Blood Culture adequate volume   Culture   Final    NO GROWTH 1 DAY Performed at [facility], [address], [place], [place] [address]    Report Status PENDING  Incomplete  Culture, blood (routine x 2)     Status: None (Preliminary result)   Collection Time: 06/03/20  3:35 AM   Specimen: BLOOD  Result Value Ref Range Status   Specimen Description BLOOD RIGHT ANTECUBITAL  Final   Special Requests   Final    BOTTLES DRAWN AEROBIC AND ANAEROBIC Blood Culture adequate volume    Culture   Final    NO GROWTH 1 DAY Performed at [facility], [address] [place], [place] [address]    Report Status PENDING  Incomplete     Radiology Studies: No results found.   Pamella Pert, MD, PhD [facility]  Between 7 am - 7 pm I am available, please contact me via Amion or Securechat  Between 7 pm - 7 am I am not available, please contact night coverage MD/APP via Amion

## 2020-06-04 NOTE — Progress Notes (Signed)
Physical Therapy Treatment Patient Details Name: Gail Montgomery MRN: [id number] DOB: 15-Jul-1996 Today's Date: 06/04/2020    History of Present Illness 24 yo who had acute R sided weakness and aphasia 05/19/20 due to LVO at [facility]. Screened Covid + but asymptomatic; tPA and underwent thrombectomy; during procedure had ICA vasospasm vs dissection.  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 Pt self extubated 05/20/20.     PT Comments    Pt progressing slowly with mobility, ambulating room distance with mod +2 assist and max multimodal facilitation of RLE during swing and stance phase of gait. Pt and mother requesting pt use knee brace locked in extension this session, but pt still very reluctant to WB on RLE due to weakness and buckling. Pt tearful after short-distance gait training this session, nods "yes" to feeling upset by dramatic decline in functional status vs baseline. Pt denies pain or that session was too difficult. PT encouraged pt to continue to work hard as she is doing now, is motivated to return to PLOF. PT continuing to recommend CIR level of care post-acutely.    Follow Up Recommendations  CIR     Equipment Recommendations  Wheelchair (measurements PT);Wheelchair cushion (measurements PT);Hospital bed;3in1 (PT)    Recommendations for Other Services       Precautions / Restrictions Precautions Precautions: Fall Precaution Comments: right sided weakness (R leg most significantly) Required Braces or Orthoses: Knee Immobilizer - Right Knee Immobilizer - Right: Other (comment) (mother and pt requested knee brace with locking mechanism for ambulation, pt requesting to wear it during session) Restrictions Weight Bearing Restrictions: No    Mobility  Bed Mobility Overal bed mobility: Needs Assistance Bed Mobility: Supine to Sit     Supine to sit:  Mod assist;HOB elevated     General bed mobility comments: mod assist for trunk elevation, completion of LE lifting and translation off EOB.  Transfers Overall transfer level: Needs assistance Equipment used: Rolling walker (2 wheeled) Transfers: Sit to/from [employer] Sit to Stand: Mod assist;+2 physical assistance Stand pivot transfers: Mod assist;+2 physical assistance       General transfer comment: Mod assist for power up, hip extension, steadying, blocking RLE. Verbal cuing for hand placement when rising/sitting, sequencing to recliner at EOB. Sit to stand x2, from EOB and from recliner.  Ambulation/Gait Ambulation/Gait assistance: +2 safety/equipment;Mod assist Gait Distance (Feet): 8 Feet Assistive device: Rolling walker (2 wheeled) Gait Pattern/deviations: Step-to pattern;Decreased step length - right;Decreased dorsiflexion - right;Trunk flexed;Leaning posteriorly Gait velocity: decr   General Gait Details: Mod assist for progression RLE during swing phase and blocking RLE during stance phase, correcting balance as pt with intermittent posterior leaning. Max verbal cuign for sequencing, inconsistently followed but responds best to short cues   Social research officer, government Rankin (Stroke Patients Only) Modified Rankin (Stroke Patients Only) Pre-Morbid Rankin Score: No symptoms Modified Rankin: Moderately severe disability     Balance Overall balance assessment: Needs assistance Sitting-balance support: Single extremity supported;No upper extremity supported Sitting balance-Leahy Scale: Fair     Standing balance support: Bilateral upper extremity supported;During functional activity Standing balance-Leahy Scale: Poor Standing balance comment: reliant on external assist                            Cognition Arousal/Alertness: Awake/alert  Behavior During Therapy: WFL for tasks assessed/performed  (tearful) Overall Cognitive Status: Difficult to assess Area of Impairment: Attention;Following commands;Safety/judgement;Problem solving                   Current Attention Level: Selective   Following Commands: Follows one step commands with increased time;Follows one step commands inconsistently Safety/Judgement: Decreased awareness of safety;Decreased awareness of deficits   Problem Solving: Requires verbal cues;Requires tactile cues;Difficulty sequencing General Comments: pt requires repeated cuing for sequencing gait task, inconsistently follows commands during gait. Pt consistently nodding yes/no correctly to questions, no verbalizations during session. Pt tearful after gait training, nods "yes" she is upset about how difficult mobility is right now.      Exercises General Exercises - Lower Extremity Ankle Circles/Pumps: AAROM;Right;10 reps;Seated Heel Slides: AAROM;Right;5 reps;Supine    General Comments        Pertinent Vitals/Pain Pain Assessment: Faces Faces Pain Scale: Hurts a little bit Pain Location: RLE, with hip/knee flexion Pain Descriptors / Indicators: Grimacing Pain Intervention(s): Monitored during session;Limited activity within patient's tolerance;Repositioned    Home Living                      Prior Function            PT Goals (current goals can now be found in the care plan section) Acute Rehab PT Goals Patient Stated Goal: mother would like her to return home with mom at d/c PT Goal Formulation: With family Time For Goal Achievement: 06/04/20 Potential to Achieve Goals: Good Progress towards PT goals: Progressing toward goals    Frequency    Min 4X/week      PT Plan Current plan remains appropriate    Co-evaluation              AM-PAC PT "6 Clicks" Mobility   Outcome Measure  Help needed turning from your back to your side while in a flat bed without using bedrails?: A Lot Help needed moving from lying on  your back to sitting on the side of a flat bed without using bedrails?: A Lot Help needed moving to and from a bed to a chair (including a wheelchair)?: A Lot Help needed standing up from a chair using your arms (e.g., wheelchair or bedside chair)?: A Lot Help needed to walk in hospital room?: A Lot Help needed climbing 3-5 steps with a railing? : Total 6 Click Score: 11    End of Session Equipment Utilized During Treatment: Gait belt;Other (comment) (R knee brace locked in extension, per mother and pt request) Activity Tolerance: Patient tolerated treatment well;Patient limited by pain Patient left: in chair;with call bell/phone within reach;with chair alarm set Nurse Communication: Mobility status PT Visit Diagnosis: Muscle weakness (generalized) (M62.81);Difficulty in walking, not elsewhere classified (R26.2);Other abnormalities of gait and mobility (R26.89) Hemiplegia - Right/Left: Right Hemiplegia - dominant/non-dominant: Dominant Hemiplegia - caused by: Cerebral infarction;Nontraumatic intracerebral hemorrhage     Time: 1137-1205 PT Time Calculation (min) (ACUTE ONLY): 28 min  Charges:  $Gait Training: 8-22 mins $Therapeutic Activity: 8-22 mins                    Makana Feigel E, PT Acute Rehabilitation Services Pager [phone]  Office [phone]    Luretha Eberly D Breshae Belcher 06/04/2020, 2:18 PM

## 2020-06-04 NOTE — Progress Notes (Signed)
Lab tech reported INR>10, notified on call physician.

## 2020-06-04 NOTE — Progress Notes (Signed)
Inpatient Rehab Admissions Coordinator:   Met with pt. And her mother at bedside for follow up conversation regarding CIR admit. Her mother expressed continued interest and requested that she be allowed to stay overnight with patient and that only female staff be allowed to bathe and dress Pt. I will make a note of this in my handoff on this patient.   Clemens Catholic, [place], [place] Admissions Coordinator  [phone] ([place]) [phone] (office)

## 2020-06-04 NOTE — Progress Notes (Signed)
INR >10. Pharmacy rec's continuing eliquis for PE and monitoring for bleeding complications.

## 2020-06-05 LAB — COMPREHENSIVE METABOLIC PANEL
ALT: 71 U/L — ABNORMAL HIGH (ref 0–44)
AST: 47 U/L — ABNORMAL HIGH (ref 15–41)
Albumin: 2.2 g/dL — ABNORMAL LOW (ref 3.5–5.0)
Alkaline Phosphatase: 48 U/L (ref 38–126)
Anion gap: 10 (ref 5–15)
BUN: <5 mg/dL — ABNORMAL LOW (ref 6–20)
CO2: 22 mmol/L (ref 22–32)
Calcium: 8.8 mg/dL — ABNORMAL LOW (ref 8.9–10.3)
Chloride: 98 mmol/L (ref 98–111)
Creatinine, Ser: 0.62 mg/dL (ref 0.44–1.00)
GFR calc Af Amer: >60 mL/min (ref >60)
GFR calc non Af Amer: >60 mL/min (ref >60)
Glucose, Bld: 98 mg/dL (ref 70–99)
Potassium: 4.3 mmol/L (ref 3.5–5.1)
Sodium: 130 mmol/L — ABNORMAL LOW (ref 135–145)
Total Bilirubin: 0.4 mg/dL (ref 0.3–1.2)
Total Protein: 7.8 g/dL (ref 6.5–8.1)

## 2020-06-05 LAB — PROTIME-INR
INR: 1.2 (ref 0.8–1.2)
Prothrombin Time: 15.1 seconds (ref 11.4–15.2)

## 2020-06-05 LAB — CBC
HCT: 31 % — ABNORMAL LOW (ref 36.0–46.0)
Hemoglobin: 9.8 g/dL — ABNORMAL LOW (ref 12.0–15.0)
MCH: 28.7 pg (ref 26.0–34.0)
MCHC: 31.6 g/dL (ref 30.0–36.0)
MCV: 90.6 fL (ref 80.0–100.0)
Platelets: 514 10*3/uL — ABNORMAL HIGH (ref 150–400)
RBC: 3.42 MIL/uL — ABNORMAL LOW (ref 3.87–5.11)
RDW: 12.3 % (ref 11.5–15.5)
WBC: 6.6 10*3/uL (ref 4.0–10.5)
nRBC: 0 % (ref 0.0–0.2)

## 2020-06-05 LAB — PROCALCITONIN: Procalcitonin: <0.1 ng/mL

## 2020-06-05 NOTE — Progress Notes (Signed)
PROGRESS NOTE  Gail Montgomery LKG:[id number] DOB: 1996/07/29 DOA: 05/19/2020 PCP: No primary care provider on file.   LOS: 17 days   Brief Narrative / Interim history: 24 year old female who was admitted to the hospital on 05/19/2020 with sudden onset of right hemiparesis and aphasia at 9:30 in the morning. She presented as a code stroke with acute left MCA syndrome. She received TPA at 11:29 AM, she was deemed a candidate for thrombectomy and was taken to IR status post successful L MCA opening with reperfusion, but L ACA had a residual small distal anterior cerebral clot. During procedure, there was an ICA vasospasm versus dissection, initially it was thought that she needs a stent however upon reimaging the vessel was open. She was loaded with aspirin and Plavix. Incidentally she was found to be Covid positive but was asymptomatic. Her hospital course was complicated by [facility], parenchymal hemorrhage and intraventricular hemorrhage. In addition, during her hospital stay she has been having intermittent fevers since 8/24, associated with sinus tachycardia. She was on antibiotics on 2 occasions, vancomycin and Zosyn-ICU 8/25-8/27, and also ceftriaxone for an E. coli UTI. Currently she is off antibiotics. She was also found to have a DVT/PE and was started on heparin drip and eventually transitioned to Eliquis on 9/3. 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  Subjective / 24h Interval events: Alert, pleasant, shakes her head no when asked about any complaints.  Remains nonverbal.  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 and IVH status post TPA reversal -continues to have right-sided weakness, lower extremity more than upper extremity. She continues to be aphasic. She has had full stroke work-up and neurology is following, transcranial Doppler with bubble study  suggestive of possible PFO. Given PE and DVT it is possible that she had paradoxical embolic infarction with PFO. Repeat CT scan on 06/01/2020 shows improvement in ICH. EEG remained nonacute. She is currently on nimodipine with goal systolic blood pressure under 664. She was also started on a statin due to LDL of 78. A TEE currently is pending, scheduled to be done tomorrow by cardiology.  If PFO is found we will need to touch base with cardiology regarding whether it can be repaired and timing.  Regardless, she can probably go to inpatient rehab after the TEE  Active Problems Possible PFO-TEE by cardiology pending  Right lower extremity acute DVT and bilateral acute PE-this is likely multifactorial due to oral contraceptives, obesity, long flight prior to her sickness. She was anticoagulated with heparin now transitioned to Eliquis  Persistent low-grade fevers-possibly related to blood clot burden, she did have evidence of E. coli UTI versus bacteriuria status post 3 days of Rocephin completed. CT abdomen pelvis negative, multiple sets of blood cultures have remained negative as well.  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 apixaban  5 mg Oral BID  . atorvastatin  20 mg Oral Daily  . Chlorhexidine Gluconate Cloth  6 each Topical Daily  . folic acid  1 mg Oral Daily  . mouth rinse  15 mL Mouth Rinse BID  . multivitamin  1 tablet Oral Daily  . niMODipine  60 mg Oral Q4H  . pantoprazole  40 mg Oral Daily  . thiamine  100 mg Oral Daily  . vitamin B-12  500 mcg Oral Daily   Continuous Infusions: PRN Meds:.acetaminophen **OR** [DISCONTINUED] acetaminophen (TYLENOL) oral liquid 160 mg/5 mL **OR** [DISCONTINUED] acetaminophen, docusate, loperamide, polyethylene glycol, sodium chloride flush  Diet Orders (From admission, onward)      Start     Ordered   05/23/20 1510  DIET DYS 2 Room service appropriate? Yes with Assist; Fluid consistency: Thin  Diet effective now       Comments: Full supervision. Meds crushed in puree.  Question Answer Comment  Room service appropriate? Yes with Assist   Fluid consistency: Thin      05/23/20 1510          DVT prophylaxis: SCD's Start: 05/19/20 1205 apixaban (ELIQUIS) tablet 5 mg     Code Status: Full Code  Family Communication: mother at bedside   Status is: Inpatient  Remains inpatient appropriate because:Ongoing diagnostic testing needed not appropriate for outpatient work up and Inpatient level of care appropriate due to severity of illness   Dispo: The patient is from: Home              Anticipated d/c is to: CIR              Anticipated d/c date is: 2 days              Patient currently is not medically stable to d/c.  Consultants:  Neurology  Cardiology   Procedures:  EEG - Thistechnically difficultstudyissuggestive of cortical dysfunction in left hemisphere, maximal left posterior quadrant.Lateralized rhythmic delta activity was seen in left hemisphere which is on the ictal-interictal continuum with low potential for seizures. Additionally, there is evidence of moderate diffuse encephalopathy, nonspecific etiology. No seizures or epileptiform discharges were seen throughout the recording.   CTA - 1. Bilateral segmental LOWER lobe pulmonary emboli, LEFT-greater-than-RIGHT. Equivocal segmental emboli within the LEFT UPPER lobe. 2. Mild cardiomegaly.   CT HEAD CODE STROKE WO CONTRAST 05/19/2020 Dense left ICA terminus and MCA. No acute hemorrhage. ASPECTS is 10.   CT Code Stroke CTA Head W/WO contrast CT Code Stroke CTA Neck W/WO contrast 05/19/2020 1. 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  2. Mildly indistinct appearance of the proximal left ICA is likely from motion artifact, consider cervical run. No  atheromatous changes or vasculopathy seen in the neck.   Neuro Interventional Radiology - Cerebral Angiogram with Intervention - Dr Loreta Ave 05/19/20 2:27 PM Left XKG:YJEHUDJS TICI 0 First Pass Device:Local aspiration and solitaire 4 x 40 ResultTICI 0 SecondPass Device:Local aspiration and solitaire 4 x 40 ResultTICI 3 Left ACA/pericallosal  Baseline TICI 0, Final TICI 0 pericallosal artery Findings:Patent right CFA Left M1 occlusion, final is TICI 3  Left ACA A2 occlusion migrated to pericallosal artery during the case. Could not complete a first pass safely.  Significant irregularity of the cervical ICA, concerning for dissection, that resolved after observation, and once DAPT was initiated. The final appearance may represent spasm vs FMD/vasculitis  CT HEAD WO CONTRAST 05/19/2020 New large volume of subarachnoid and intraventricular hemorrhage.  CT HEAD WO CONTRAST 05/20/2020 1. Subarachnoid hemorrhage along the anterior interhemispheric fissure with slightly increased extension into both lateral ventricles.  2. Slight increase in size of the temporal horn of the left lateral ventricle consistent with early communicating hydrocephalus.   MR BRAIN WO CONTRAST 05/20/2020 1. Early subacute infarct of the left basal ganglia involving the posterior putamen and caudate body.  2. Subarachnoid hemorrhage along  the interhemispheric fissure extending into the lateral ventricles.  3. 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   CT HEAD WO CONTRAST 05/21/2020 1. Stable appearance of left basal ganglia nonhemorrhagic infarction. 2. Stable medial left frontal extra-axial hemorrhage, extending into the ventricles bilaterally. 3. Subarachnoid hemorrhage posteriorly on the left and along the inter cerebral hemisphere likely represents  redistribution without significant new hemorrhage. 4. Hemorrhage is present within the aqueduct of Sylvius. 5. Progressive dilation of the ventricles compatible with developing hydrocephalus. 6. No new areas of hemorrhage.   CT HEAD WO CONTRAST 05/22/2020 1. Regressed intraventricular hemorrhage since yesterday with small volume residual. Stable mild ventriculomegaly, and possible mild transependymal edema. 2. Stable left inferior frontal gyrus parasagittal hematoma (approximately 12 mL) and regional edema. No significant midline shift, basilar cisterns are patent. 3. Stable left basal ganglia infarcts. 4. No new intracranial abnormality.   CT HEAD WO CONTRAST 05/24/20 IMPRESSION: 1. 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 2. Unchanged intracranial hemorrhage with mild lateral ventriculomegaly.  ECHOCARDIOGRAM COMPLETE 05/20/2020 1. Left ventricular ejection fraction, by estimation, is 60 to 65%. The left ventricle has normal function. The left ventricle has no regional wall motion abnormalities. Left ventricular diastolic parameters were normal.  2. Right ventricular systolic function is normal. The right ventricular size is normal.  3. The mitral valve is normal in structure. Trivial mitral valve regurgitation. No evidence of mitral stenosis.  4. The aortic valve is normal in structure. Aortic valve regurgitation is not visualized. No aortic stenosis is present.  5. The inferior vena cava is normal in size with greater than 50% respiratory variability, suggesting right atrial pressure of 3 mmHg.   Bilateral Lower Extremity Venous Dopplers  05/21/2020 RIGHT: - There is no evidence of deep vein thrombosis in the lower extremity. However, portions of this examination were limited- see technologist comments above. - No cystic structure found in the popliteal fossa.  LEFT: - There is no evidence of deep vein thrombosis in the lower extremity. However, portions of this examination  were limited- see technologist comments above. - No cystic structure found in the popliteal fossa.   Transcranial Doppler w/ Bubble 05/25/2020 Not cooperative for Valsalva. Bubble study showed Spencer degree 3 at rest. Recommend TEE for further confirmation.  Microbiology  Blood cultures-negative to date Urine cultures 8/30-E. coli resistant to ampicillin  Antimicrobials: Vancomycin and Zosyn while in ICU Ceftriaxone 9/2-9/4   Objective: Vitals:   06/04/20 0801 06/04/20 1639 06/04/20 2128 06/05/20 0735  BP: 104/75 110/67 113/73 117/73  Pulse: (!) 102 99 81 95  Resp: 16 17 20 17   Temp: 98.4 F (36.9 C) 100 F (37.8 C) 97.6 F (36.4 C) 98.3 F (36.8 C)  TempSrc:   Oral   SpO2: 99% 100% 100% 100%  Weight:      Height:        Intake/Output Summary (Last 24 hours) at 06/05/2020 0930 Last data filed at 06/04/2020 2133 Gross per 24 hour  Intake --  Output 1000 ml  Net -1000 ml   Filed Weights   05/19/20 1100 05/29/20 0400  Weight: 84.2 kg 85.1 kg    Examination:  Constitutional: No distress Eyes: No icterus  Respiratory: Clear bilaterally  Cardiovascular: Regular rate and rhythm, no murmurs, no edema Neurologic: Weak on the right side, lower extremity more than upper extremity, normal strength on left.   Data Reviewed: I have independently reviewed following labs and imaging studies   CBC: Recent Labs  Lab 06/01/20 0325 06/02/20 0101 06/03/20 0326 06/04/20 0114 06/05/20 0514  WBC 11.8* 8.3 8.2 7.2 6.6  HGB 9.8* 8.9* 9.4* 9.7* 9.8*  HCT 29.5* 27.4* 29.1* 29.5* 31.0*  MCV 87.8 86.7 87.1 88.1 90.6  PLT 512* 485* 533* 547* 514*   Basic Metabolic Panel: Recent Labs  Lab 05/31/20 0828 05/31/20 0828 06/01/20 0325 06/02/20 0101 06/03/20 0326 06/04/20 0114 06/05/20 0514  NA 129*   < > 131* 126* 127* 132* 130*  K 3.7   < > 4.3 3.5 4.1 4.3 4.3  CL 99   < > 97* 96* 95* 99 98  CO2 19*   < > 21* 19* 21* 22 22  GLUCOSE 102*   < > 97 119* 108* 107* 98  BUN  9   < > 7 7 <5* <5* <5*  CREATININE 0.51   < > 0.58 0.54 0.50 0.56 0.62  CALCIUM 8.4*   < > 8.8* 8.6* 8.9 8.8* 8.8*  MG 2.0  --  2.0 1.8 2.0 1.9  --    < > = values in this interval not displayed.   Liver Function Tests: Recent Labs  Lab 06/01/20 0325 06/02/20 0101 06/03/20 0326 06/04/20 0114 06/05/20 0514  AST 83* 77* 96* 64* 47*  ALT 90* 92* 115* 102* 71*  ALKPHOS 46 45 52 52 48  BILITOT 0.4 0.3 0.4 0.5 0.4  PROT 7.6 7.2 7.7 7.8 7.8  ALBUMIN 2.2* 2.1* 2.2* 2.1* 2.2*   Coagulation Profile: Recent Labs  Lab 06/01/20 0325 06/02/20 0101 06/03/20 0326 06/04/20 0114 06/05/20 0514  INR 1.2 1.3* 1.2 >10.0* 1.2   HbA1C: No results for input(s): HGBA1C in the last 72 hours. CBG: Recent Labs  Lab 05/31/20 0748 05/31/20 1944 05/31/20 2324 06/01/20 0353  GLUCAP 101* 100* 111* 90    Recent Results (from the past 240 hour(s))  Culture, blood (routine x 2)     Status: None   Collection Time: 05/28/20  4:39 AM   Specimen: BLOOD RIGHT HAND  Result Value Ref Range Status   Specimen Description BLOOD RIGHT HAND  Final   Special Requests   Final    BOTTLES DRAWN AEROBIC ONLY Blood Culture results may not be optimal due to an inadequate volume of blood received in culture bottles   Culture   Final    NO GROWTH 5 DAYS Performed at [facility], [address] [place], [place] [address]    Report Status 06/02/2020 FINAL  Final  Culture, blood (routine x 2)     Status: None   Collection Time: 05/28/20  4:39 AM   Specimen: BLOOD  Result Value Ref Range Status   Specimen Description BLOOD RIGHT ARM  Final   Special Requests   Final    BOTTLES DRAWN AEROBIC ONLY Blood Culture results may not be optimal due to an inadequate volume of blood received in culture bottles   Culture   Final    NO GROWTH 5 DAYS Performed at [facility], [address] [place], [place] [address]    Report Status 06/02/2020 FINAL  Final  Culture, Urine     Status: Abnormal   Collection  Time: 05/28/20  1:51 PM   Specimen: Urine, Random  Result Value Ref Range Status   Specimen Description URINE, RANDOM  Final   Special Requests   Final    NONE Performed at [facility], [address] [place], [place] [address]    Culture >=100,000 COLONIES/mL ESCHERICHIA  COLI (A)  Final   Report Status 05/30/2020 FINAL  Final   Organism ID, Bacteria ESCHERICHIA COLI (A)  Final      Susceptibility   Escherichia coli - MIC*    AMPICILLIN >=32 RESISTANT Resistant     CEFAZOLIN 16 SENSITIVE Sensitive     CEFTRIAXONE <=0.25 SENSITIVE Sensitive     CIPROFLOXACIN <=0.25 SENSITIVE Sensitive     GENTAMICIN >=16 RESISTANT Resistant     IMIPENEM <=0.25 SENSITIVE Sensitive     NITROFURANTOIN <=16 SENSITIVE Sensitive     TRIMETH/SULFA <=20 SENSITIVE Sensitive     AMPICILLIN/SULBACTAM >=32 RESISTANT Resistant     PIP/TAZO <=4 SENSITIVE Sensitive     * >=100,000 COLONIES/mL ESCHERICHIA COLI  Culture, blood (routine x 2)     Status: None (Preliminary result)   Collection Time: 06/03/20  3:30 AM   Specimen: BLOOD  Result Value Ref Range Status   Specimen Description BLOOD LEFT ANTECUBITAL  Final   Special Requests   Final    BOTTLES DRAWN AEROBIC AND ANAEROBIC Blood Culture adequate volume   Culture   Final    NO GROWTH 2 DAYS Performed at [facility], [address] [place], [place] [address]    Report Status PENDING  Incomplete  Culture, blood (routine x 2)     Status: None (Preliminary result)   Collection Time: 06/03/20  3:35 AM   Specimen: BLOOD  Result Value Ref Range Status   Specimen Description BLOOD RIGHT ANTECUBITAL  Final   Special Requests   Final    BOTTLES DRAWN AEROBIC AND ANAEROBIC Blood Culture adequate volume   Culture   Final    NO GROWTH 2 DAYS Performed at [facility]  [facility], [address] [place], [place] [address]    Report Status PENDING  Incomplete     Radiology Studies: No results found.   Pamella Pertostin Natan Hartog, MD, PhD [facility]  Between 7 am - 7 pm I am available, please contact me via Amion or Securechat  Between 7 pm - 7 am I am not available, please contact night coverage MD/APP via Amion

## 2020-06-05 NOTE — Progress Notes (Signed)
IP rehab admissions - I have received authorization from [facility] for acute inpatient rehab admission once patient is medically ready.  Noted TEE scheduled for tomorrow.  Will await completion of work up and procedures prior to inpatient rehab admission.  Call me for questions.  [phone]

## 2020-06-05 NOTE — Progress Notes (Signed)
Patient had witnessed fall while using BSC with mother. No injury sustained. Patient fell straight down on bottom. Mother still prefers to toilet patient independently because she feels patient is strong and mindful enough to support self. Patient and mother were educated prior to fall to contact staff for transfer and toileting assistance. During rounds at 1400 this nurse and four other staff members entered room and offered to assist patient to [facility], patient mother declined.

## 2020-06-05 NOTE — Progress Notes (Signed)
Occupational Therapy Treatment Patient Details Name: Gail Montgomery MRN: [id number] DOB: 05-May-1996 Today's Date: 06/05/2020    History of present illness 24 yo who had acute R sided weakness and aphasia 2/77/82 due to LVO at [facility]. Screened Covid + but asymptomatic; tPA and underwent thrombectomy; during procedure had ICA vasospasm vs dissection.  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 Pt self extubated 05/20/20.    OT comments  Making excellent progress and demonstrates good awareness of deficits as demonstrated by her being appropriately emotional at times during session. Pt held arms over therapist's shoulder with OT/PT facilitating weight shifts and advancing RLE with control of R knee/hip to ambulate from bed to sink to complete sink level grooming task. Pt appears to demonstrate difficulty with motor planning, however significantly improved since intial eval. Laughing appropriately at therapist's jokes throughout session.  Goals updated. Excellent CIR candidate. Will continue to follow acutely.  Follow Up Recommendations  CIR;Supervision/Assistance - 24 hour    Equipment Recommendations  3 in 1 bedside commode;Other (comment)    Recommendations for Other Services Rehab consult    Precautions / Restrictions Precautions Precautions: Fall Precaution Comments: right sided weakness (R leg most significantly) Knee Immobilizer - Right:  (mother ordered knee brace; did not use during session) Restrictions Weight Bearing Restrictions: No       Mobility Bed Mobility Overal bed mobility: Needs Assistance Bed Mobility: Supine to Sit     Supine to sit: Min assist;HOB elevated     General bed mobility comments: Pt attempting to help bring RLE off bed  Transfers Overall transfer level: Needs assistance Equipment used: 2 person hand held assist Transfers:  Sit to/from [facility] Sit to Stand: Mod assist;+2 physical assistance Stand pivot transfers: Mod assist;+2 physical assistance       General transfer comment: Mod +2 for power up, steadying, blocking RLE. Pt transferred to stand with bilateral UEs draped on PT and OT shoulders. Sit to stand x3, from EOB and BSC x2.    Balance Overall balance assessment: Needs assistance Sitting-balance support: Feet supported;Single extremity supported Sitting balance-Leahy Scale: Fair Sitting balance - Comments: able to sit EOB without PT support   Standing balance support: During functional activity;Single extremity supported Standing balance-Leahy Scale: Poor Standing balance comment: reliant on PT and OT assist; able to stand at sink x1 minute with LUE propped on sink during ADL tasks                           ADL either performed or assessed with clinical judgement   ADL Overall ADL's : Needs assistance/impaired Eating/Feeding: Set up;Sitting   Grooming: Minimal assistance;Cueing for sequencing;Sitting;Standing Grooming Details (indicate cue type and reason): partial session completed in standing Upper Body Bathing: Minimal assistance;Sitting           Lower Body Dressing: Maximal assistance Lower Body Dressing Details (indicate cue type and reason): Able to reach forward to help donn socks in seated osition Toilet Transfer: Moderate assistance;+2 for physical assistance;Stand-pivot;Ambulation   Toileting- Clothing Manipulation and Hygiene: Maximal assistance       Functional mobility during ADLs: Maximal assistance;+2 for physical assistance (ambulation) General ADL Comments: Grooming task completed in front of mirror at sink inboth sit/stand opsition. Pt tendingto lean/propr on L arm when brushing teeth using R hand. Ablet o maintain upright midline posture with mod A at times during activity  with tactile cues for extension     Vision   Additional  Comments: will further assess; may have decreased R field   Perception     Praxis      Cognition Arousal/Alertness: Awake/alert Behavior During Therapy: WFL for tasks assessed/performed Overall Cognitive Status: Difficult to assess Area of Impairment: Attention;Following commands;Safety/judgement;Problem solving                   Current Attention Level: Selective   Following Commands: Follows one step commands with increased time Safety/Judgement: Decreased awareness of safety Awareness: Emergent Problem Solving: Slow processing;Difficulty sequencing;Requires tactile cues General Comments: Appears apraxic        Exercises General Exercises - Lower Extremity Quad Sets: AAROM;Right;5 reps;Seated (with towel roll under ankle)   Shoulder Instructions       General Comments Pt's arms over therapist's shoulders with therapist facilitating weight shifts and stepping pattern; Appeasr apraxic. would most likely do better with tactile imput and limited verbal commands    Pertinent Vitals/ Pain       Pain Assessment: Faces Faces Pain Scale: Hurts little more Pain Location: RLE, stance phase Pain Descriptors / Indicators: Grimacing;Guarding Pain Intervention(s): Limited activity within patient's tolerance  Home Living                                          Prior Functioning/Environment              Frequency  Min 2X/week        Progress Toward Goals  OT Goals(current goals can now be found in the care plan section)  Progress towards OT goals: Progressing toward goals;Goals met and updated - see care plan  Acute Rehab OT Goals Patient Stated Goal: mother would like her to return home with mom at d/c OT Goal Formulation: Patient unable to participate in goal setting Time For Goal Achievement: 06/18/20 Potential to Achieve Goals: Good ADL Goals Pt Will Perform Grooming: with min assist;sitting Pt Will Perform Upper Body Bathing: with min  assist;sitting Pt Will Perform Lower Body Bathing: with mod assist;sit to/from stand Pt Will Transfer to Toilet: with mod assist;bedside commode;squat pivot transfer Additional ADL Goal #1: Pt will demosntrate sustained attnetion to task in nondistrating enviornment with minimal redurectional cues  Plan Discharge plan remains appropriate    Co-evaluation    PT/OT/SLP Co-Evaluation/Treatment: Yes Reason for Co-Treatment: Complexity of the patient's impairments (multi-system involvement);For patient/therapist safety;To address functional/ADL transfers PT goals addressed during session: Mobility/safety with mobility;Balance;Strengthening/ROM OT goals addressed during session: ADL's and self-care;Proper use of Adaptive equipment and DME      AM-PAC OT "6 Clicks" Daily Activity     Outcome Measure   Help from another person eating meals?: A Little Help from another person taking care of personal grooming?: A Little Help from another person toileting, which includes using toliet, bedpan, or urinal?: A Lot Help from another person bathing (including washing, rinsing, drying)?: A Lot Help from another person to put on and taking off regular upper body clothing?: A Lot Help from another person to put on and taking off regular lower body clothing?: A Lot 6 Click Score: 14    End of Session Equipment Utilized During Treatment: Gait belt  OT Visit Diagnosis: Unsteadiness on feet (R26.81);Other abnormalities of gait and mobility (R26.89);Muscle weakness (generalized) (M62.81);Apraxia (R48.2);Other symptoms and signs involving cognitive function;Cognitive communication deficit (R41.841);Other symptoms and  signs involving the nervous system (R29.898);Hemiplegia and hemiparesis Symptoms and signs involving cognitive functions: Cerebral infarction;Nontraumatic SAH Hemiplegia - Right/Left: Right Hemiplegia - dominant/non-dominant: Dominant Hemiplegia - caused by: Cerebral infarction;Nontraumatic  SAH   Activity Tolerance Patient tolerated treatment well   Patient Left in chair;with call bell/phone within reach;with chair alarm set   Nurse Communication Mobility status        Time: 2929-0903 OT Time Calculation (min): 40 min  Charges: OT General Charges $OT Visit: 1 Visit OT Treatments $Self Care/Home Management : 8-22 mins $Neuromuscular Re-education: 8-22 mins  Maurie Boettcher, OT/L   Acute OT Clinical Specialist Acute Rehabilitation Services Pager [phone] Office [phone]    [facility] 06/05/2020, 1:25 PM

## 2020-06-05 NOTE — Progress Notes (Signed)
STROKE TEAM PROGRESS NOTE   INTERVAL HISTORY No family is at bedside. Pt sitting in chair. She smiles to me and responding to questions with nodding or shaking head, nonverbal even with encouragement. Otherwise, no neuro changes. Pending TEE tomorrow. Pending CIR.    OBJECTIVE Vitals:   06/04/20 0801 06/04/20 1639 06/04/20 2128 06/05/20 0735  BP: 104/75 110/67 113/73 117/73  Pulse: (!) 102 99 81 95  Resp: Temp: 98.4 F (36.9 C) 100 F (37.8 C) 97.6 F (36.4 C) 98.3 F (36.8 C)  TempSrc:   Oral   SpO2: 99% 100% 100% 100%  Weight:      Height:       CBC:  Recent Labs  Lab 06/04/20 0114 06/05/20 0514  WBC 7.2 6.6  HGB 9.7* 9.8*  HCT 29.5* 31.0*  MCV 88.1 90.6  PLT 547* 514*   Basic Metabolic Panel:  Recent Labs  Lab 06/03/20 0326 06/03/20 0326 06/04/20 0114 06/05/20 0514  NA 127*   < > 132* 130*  K 4.1   < > 4.3 4.3  CL 95*   < > 99 98  CO2 21*   < > 22 22  GLUCOSE 108*   < > 107* 98  BUN <5*   < > <5* <5*  CREATININE 0.50   < > 0.56 0.62  CALCIUM 8.9   < > 8.8* 8.8*  MG 2.0  --  1.9  --    < > = values in this interval not displayed.   Lipid Panel:     Component Value Date/Time   CHOL 156 05/20/2020 0958   TRIG 105 05/20/2020 0958   TRIG 104 05/20/2020 0958   HDL 57 05/20/2020 0958   CHOLHDL 2.7 05/20/2020 0958   VLDL 21 05/20/2020 0958   LDLCALC 78 05/20/2020 0958   HgbA1c:  Lab Results  Component Value Date   HGBA1C 5.6 05/20/2020   Urine Drug Screen:     Component Value Date/Time   LABOPIA NONE DETECTED 05/20/2020 1736   COCAINSCRNUR NONE DETECTED 05/20/2020 1736   LABBENZ NONE DETECTED 05/20/2020 1736   AMPHETMU NONE DETECTED 05/20/2020 1736   THCU POSITIVE (A) 05/20/2020 1736   LABBARB NONE DETECTED 05/20/2020 1736    Alcohol Level No results found for: [facility]  IMAGING  CT HEAD CODE STROKE WO CONTRAST 05/19/2020 Dense left ICA terminus and MCA. No acute hemorrhage. ASPECTS is 10.   CT Code Stroke CTA Head W/WO  contrast CT Code Stroke CTA Neck W/WO contrast 05/19/2020 1. 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  2. Mildly indistinct appearance of the proximal left ICA is likely from motion artifact, consider cervical run. No atheromatous changes or vasculopathy seen in the neck.   Neuro Interventional Radiology - Cerebral Angiogram with Intervention - Dr Loreta Ave 05/19/20 2:27 PM Left MCA: Baseline TICI 0 First Pass Device:                 Local aspiration and solitaire 4 x 40              Result                         TICI 0 Second Pass Device:           Local aspiration and solitaire 4 x 40              Result  TICI 3 Left ACA/pericallosal  Baseline TICI 0,    Final TICI 0 pericallosal artery Findings:  Patent right CFA Left M1 occlusion, final is TICI 3  Left ACA A2 occlusion migrated to pericallosal artery during the case.  Could not complete a first pass safely.  Significant irregularity of the cervical ICA, concerning for dissection, that resolved after observation, and once DAPT was initiated.  The final appearance may represent spasm vs FMD/vasculitis  CT HEAD WO CONTRAST 05/19/2020 New large volume of subarachnoid and intraventricular hemorrhage.  CT HEAD WO CONTRAST 05/20/2020 1. Subarachnoid hemorrhage along the anterior interhemispheric fissure with slightly increased extension into both lateral ventricles.  2. Slight increase in size of the temporal horn of the left lateral ventricle consistent with early communicating hydrocephalus.   MR BRAIN WO CONTRAST 05/20/2020 1. Early subacute infarct of the left basal ganglia involving the posterior putamen and caudate body.  2. Subarachnoid hemorrhage along the interhemispheric fissure extending into the lateral ventricles.  3. 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   CT HEAD WO CONTRAST 05/21/2020 1. Stable appearance  of left basal ganglia nonhemorrhagic infarction. 2. Stable medial left frontal extra-axial hemorrhage, extending into the ventricles bilaterally. 3. Subarachnoid hemorrhage posteriorly on the left and along the inter cerebral hemisphere likely represents redistribution without significant new hemorrhage. 4. Hemorrhage is present within the aqueduct of Sylvius. 5. Progressive dilation of the ventricles compatible with developing hydrocephalus. 6. No new areas of hemorrhage.   CT HEAD WO CONTRAST 05/22/2020 1. Regressed intraventricular hemorrhage since yesterday with small volume residual. Stable mild ventriculomegaly, and possible mild transependymal edema. 2. Stable left inferior frontal gyrus parasagittal hematoma (approximately 12 mL) and regional edema. No significant midline shift, basilar cisterns are patent. 3. Stable left basal ganglia infarcts. 4. No new intracranial abnormality.   CT HEAD WO CONTRAST 05/24/20 IMPRESSION: 1. 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 2. Unchanged intracranial hemorrhage with mild lateral ventriculomegaly.  CT HEAD WO CONTRAST 06/01/20 IMPRESSION: 1. Focal hematoma in the anterior interhemispheric fissure continues to become less dense. 2. Infarction in the left cingulate gyrus and left posterior putamen and caudate nucleus as seen previously. Question some infarction also of the left temporal lobe.  CT ABDOMEN AND PELVIS W CONTRAST 06/02/20 IMPRESSION: 1. No acute findings or explanation for the patient's symptoms. 2. Probable nonocclusive thrombus in the right femoral vein. Grossly stable known bilateral lower lobe pulmonary emboli, similar to recent chest CTA.  ECHOCARDIOGRAM COMPLETE 05/20/2020 1. Left ventricular ejection fraction, by estimation, is 60 to 65%. The left ventricle has normal function. The left ventricle has no regional wall motion abnormalities. Left ventricular diastolic parameters were normal.   2. Right ventricular systolic  function is normal. The right ventricular size is normal.   3. The mitral valve is normal in structure. Trivial mitral valve regurgitation. No evidence of mitral stenosis.   4. The aortic valve is normal in structure. Aortic valve regurgitation is not visualized. No aortic stenosis is present.   5. The inferior vena cava is normal in size with greater than 50% respiratory variability, suggesting right atrial pressure of 3 mmHg.   Bilateral Lower Extremity Venous Dopplers  05/21/2020 RIGHT: - There is no evidence of deep vein thrombosis in the lower extremity. However, portions of this examination were limited- see technologist comments above.  - No cystic structure found in the popliteal fossa.   LEFT: - There is no evidence of deep vein thrombosis in  the lower extremity. However, portions of this examination were limited- see technologist comments above.  - No cystic structure found in the popliteal fossa.    Transcranial Doppler w/ Bubble 05/25/2020 Not cooperative for Valsalva.  Bubble study showed Spencer degree 3 at rest.  Recommend TEE for further confirmation.  CT head 05/29/2020 :Interval decrease in the size/conspicuity of intraparenchymal, extra-axial, and intraventricular hemorrhage, as above. No new acute hemorrhage.  EEG 06/01/20 ABNORMALITY - Continuous slow, generalized and lateralized left hemisphere - Intermittent rhythmic delta slowing, left hemisphere, maximal left posterior quadrant IMPRESSION: This technically difficult study is suggestive of cortical dysfunction in left hemisphere, maximal left posterior quadrant.  Lateralized rhythmic delta activity was seen in left hemisphere which is on the ictal-interictal continuum with low potential for seizures.  Additionally, there is evidence of moderate diffuse encephalopathy, nonspecific etiology.  No seizures or epileptiform discharges were seen throughout the recording.  PHYSICAL EXAM    Temp:  [97.6 F (36.4 C)-100 F (37.8  C)] 98.3 F (36.8 C) (09/07 0735) Pulse Rate:  [81-99] 95 (09/07 0735) Resp:  [17-20] 17 (09/07 0735) BP: (110-117)/(67-73) 117/73 (09/07 0735) SpO2:  [100 %] 100 % (09/07 0735)  General -mildly obese young African-American lady  Ophthalmologic - fundi not visualized due to noncooperation.   Cardiovascular - regular rhythm and mild tachycardia  Neuro - Awake alert with flat affect, eyes open, largely nonverbal but able to tell me what she ordered for dinner, however did not say anything else.  Able to follow simple commands on both hands and left foot.  No gaze deviation, blinking to visual threat bilaterally, PERRL. Right facial droop. Tongue protrusion midline. LUE 5/5 and LLE 5/5. RUE 4+/5. RLE proximal 3 -/5, knee extension 3/5, distal PF/DF 1/5, on brace. Sensation, coordination not cooperative and gait not tested.   ASSESSMENT/PLAN Ms. Gail Montgomery is a 24 y.o. female with history of tobacco use, anxiety (panic attacks) and hormonal birth control presented with right-sided weakness and aphasia that started abruptly at 9:30 AM.  COVID - positive. IV t-PA - Saturday 05/19/20 at 1145. Thrombectomy - Dr Loreta Ave - Left M1 occlusion, final is TICI 3.   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 Paradoxical emboli in the setting of PFO 3. COVID hypercoagulability in the setting of OCP use and intermittent smoking - however, COVID Ab high titer    CT Head - Dense left ICA terminus and MCA. No acute hemorrhage. ASPECTS is 10.      CTA H&N - Emergent large vessel occlusion at the left ICA terminus continuing into the MCA. Mildly indistinct appearance of the proximal left ICA is likely from motion artifact, consider cervical run. No atheromatous changes or vasculopathy seen in the neck.  IR -  left MCA and A2 occlusion s/p TICI3 of MCA and TICI0 of ACA. Initially left ICA stenosis post IR but resolved on the final run  CT head 8/21 - New large volume of subarachnoid and intraventricular hemorrhage.   CT head 8/22 [facility] along the anterior interhemispheric fissure with slightly increased extension into both lateral ventricles. Slight increase in size of the temporal horn of the left lateral ventricle consistent with early communicating hydrocephalus.   MRI head 8/22 -  Early subacute infarct of the left basal  ganglia involving the posterior putamen and caudate body.   CT head 8/23 - stable L basal ganglia infarct, ICH/IVH/SAH. Progressive dilation of ventricles.  CT head 8/24 - regressed IVH. Stable infarct and frontal ICH. Stable ventriculomegaly  CT head 8/26 - stable infarcts & hemorrhage w/ mild ventriculomegaly   CT - 06/01/20 - frontal hematoma less dense. Stable left MCA infarct  CT Abd and Pelvis 06/02/20 - No acute findings or explanation for the patient's symptoms. Probable nonocclusive thrombus in the right femoral vein. Grossly stable known bilateral lower lobe pulmonary emboli, similar to recent chest CTA.  EEG - 06/01/20 Lateralized rhythmic delta activity was seen in left hemisphere which is on the ictal-interictal continuum with low potential for seizures.  2D Echo EF 60-65%  TCD bubble study Spencer degree 3 at rest.  Not cooperative for Valsalva.  TEE pending in am  [employer] Corona Virus 2 - positive  LDL - 78  HgbA1c 5.6  UDS - THC positive  VTE prophylaxis - SCDs  No antithrombotic prior to admission, now on No antithrombotic due to [facility] and IVH -> Eliquis started 9/3   Ongoing aggressive stroke risk factor management  Therapy recommendations:  CIR   Disposition:  Pending   Hydrocephalus, mild  CT head showed mildly increased size of left temporal horn   CT repeat stable hemorrhage but mild progression of hydrocephalus  CT head 8/24 - regressed IVH.  Stable infarct and frontal ICH. Stable mild ventriculomegaly  NSG on board  3% hypertonic saline - 50 cc/hr ->25cc->10cc -> off  PICC placed 8/23   Repeat CT on 8/26 and 9/3 stable  DVT and PE  8/23 LE venous Doppler negative for DVT  8/30 LE venous Doppler showed acute right LE DVT   8/30 CTA chest bilateral segmental lower lobe PE, left greater than right  Was on heparin IV, transitioned to Eliquis 9/3  INR 1.2->>10.0->1.2  PFO  TCD bubble study showed Spencer degree 3 at rest.  Not cooperative with Valsalva  Plan for TEE in am  If PFO confirmed, consider to refer to Dr. Excell Seltzerooper cardiology for PFO closure.  BP management  Stable  On Nimodipine for [facility] - discussed with Dr. Conchita ParisNundkumar, will keep nimodipine while inpt - d/c on discharge if neuro stable  SBP goal < 160 . Long-term BP goal normotensive  Hyperlipidemia  Home Lipid lowering medication: none   LDL 78, goal < 70  AST/ALT 96/115->64/102->47/71  On Lipitor 20  Continue statin at discharge  ? Hypercoagulable state   COVID positive - asymptomatic  D-dimer > 20->16.13->17.44-> >20-> >20->12.89->6.37->5.4->6.02->5.86->5.68  OCP user with smoking and THC use  Hypercoagulable work up positive ANCA proteinase 3 - 6.0 (H) (0.0 - 3.5) and positive ANA  Repeat above as outpatient  COVID - 19 infection  COVID + test 8/21  Asymptomatic  Ferritin 24->30->37->51->93->92->12 5  Fibrinogen 289  CRP 0.8->2.4->3.3->3.9->8.7->9.1->28.8->17.3->13.5->11.5->9.4  D-dimer > 20->16.13->17.44-> >20-> >20 ->12.89->6.37->5.4->6.02->5.86->5.68  COVID Ab titer > 2500  off isolation now per ID  Leukocytosis, Fever, tachycardia  Etiology unclear - may be due to PE  TMax 100.6 -> afebrile  WBC 10.9->12.2->12.5->11.0->14.0->13.9->17.0...11.8->8.3->8.2->7.2->7.2->6.6  HR 100-120s  UA small LE, rare bact, 0-5 WBC  CXR neg  Urine culture - 8/30 E. Coli   Blood Cx - 8/25  And 8/30 all neg  Blood  Cultures 9/5 - NGTD  Vanco and zosyn empiric treatment 8/25>>8/27  Rocephin 9/2 >>9/4  Euthyroid sick syndrome  Low TSH 0.054   normal free T4 and  T3  Likely related to critical illness  Repeat TSH as outpt  Tobacco abuse  Intermittent smoking per mom  Smoking cessation counseling will be provided  Other Stroke Risk Factors  ETOH use, advised to drink no more than 1 alcoholic beverage per day  Obesity, Body mass index is 32.2 kg/m., recommend weight loss, diet and exercise as appropriate   Hormonal birth control - both progesterone and estrogen - Estarylla  Other Active Problems  Code status - Full code  Low normal B12 level 300 - supplement  Intermittent diarrhea - imodium PRN  SIADH d/t stroke 126->127->132->130  Hospital day # 17    Marvel Plan, MD PhD Stroke Neurology 06/05/2020 12:02 PM    To contact Stroke Continuity provider, please refer to [email or web address]. After hours, contact General Neurology

## 2020-06-05 NOTE — Progress Notes (Signed)
Patient HR sinus tach, going up to high 120's. Gherghe alerted.

## 2020-06-05 NOTE — Progress Notes (Signed)
Physical Therapy Treatment Patient Details Name: Genessis Flanary MRN: [id number] DOB: 1996/01/31 Today's Date: 06/05/2020    History of Present Illness 24 yo who had acute R sided weakness and aphasia 05/19/20 due to LVO at [facility]. Screened Covid + but asymptomatic; tPA and underwent thrombectomy; during procedure had ICA vasospasm vs dissection.  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 Pt self extubated 05/20/20.     PT Comments    Pt demonstrating mobility progression this day, tolerating x2 bouts of short distance gait training with mod +2 assist mostly for balance and RLE facilitation, ADL tasks at sink, and RLE exercise post-gait training. Pt with tachycardia with activity up to 160 bpm, recovers to 130s with brief seated rest. Pt presenting with RLE weakness and heel cord/hamstring tightness this day, benefiting from assisted quad sets and DF. Pt remains highly motivated, is an excellent CIR candidate. PT to continue to follow acutely.     Follow Up Recommendations  CIR     Equipment Recommendations  Wheelchair (measurements PT);Wheelchair cushion (measurements PT);Hospital bed;3in1 (PT)    Recommendations for Other Services       Precautions / Restrictions Precautions Precautions: Fall Precaution Comments: right sided weakness (R leg most significantly) Restrictions Weight Bearing Restrictions: No    Mobility  Bed Mobility Overal bed mobility: Needs Assistance Bed Mobility: Supine to Sit     Supine to sit: Min assist;+2 for safety/equipment;HOB elevated     General bed mobility comments: Min assist for bringing RLE to EOB, pt able to scoot to EOB with increased time and effort. Verbal cuing for hand placement on EOB to aid in scooting.  Transfers Overall transfer level: Needs assistance Equipment used: 2 person hand held assist Transfers:  Sit to/from Stand Sit to Stand: Mod assist;+2 physical assistance         General transfer comment: Mod +2 for power up, steadying, blocking RLE. Pt transferred to stand with bilateral UEs draped on PT and OT shoulders. Sit to stand x3, from EOB and BSC x2.  Ambulation/Gait Ambulation/Gait assistance: Mod assist;+2 physical assistance Gait Distance (Feet): 6 Feet (2x6) Assistive device: Rolling walker (2 wheeled) Gait Pattern/deviations: Step-to pattern;Decreased step length - right;Decreased dorsiflexion - right;Trunk flexed;Leaning posteriorly;Decreased stance time - right Gait velocity: decr   General Gait Details: Mod +2 for steadying, blocking RLE during stance phase and facilitating RLE during swing phase of gait, intermittent blocking LLE for buckling during stance phase, and guiding pt to desination surface. verbal cuing for sequencing, responds best to short cues "step right, wait for me, step left"   Stairs             Wheelchair Mobility    Modified Rankin (Stroke Patients Only) Modified Rankin (Stroke Patients Only) Pre-Morbid Rankin Score: No symptoms Modified Rankin: Moderately severe disability     Balance Overall balance assessment: Needs assistance Sitting-balance support: Feet supported;Single extremity supported Sitting balance-Leahy Scale: Fair Sitting balance - Comments: able to sit EOB without PT support   Standing balance support: During functional activity;Single extremity supported Standing balance-Leahy Scale: Poor Standing balance comment: reliant on PT and OT assist; able to stand at sink x1 minute with LUE propped on sink during ADL tasks                            Cognition Arousal/Alertness: Awake/alert Behavior During Therapy: [facility] for tasks assessed/performed (  tearful) Overall Cognitive Status: Difficult to assess Area of Impairment: Attention;Following commands;Safety/judgement;Problem solving                    Current Attention Level: Selective   Following Commands: Follows one step commands with increased time;Follows one step commands inconsistently Safety/Judgement: Decreased awareness of safety;Decreased awareness of deficits   Problem Solving: Requires verbal cues;Requires tactile cues;Difficulty sequencing General Comments: Inconsistent command following most notable during gait training, possibly secondary to apraxia. Pt benefits from short, multimodal cues during mobility. Pt with periods of appropriate laughing, tearfulness during session      Exercises General Exercises - Lower Extremity Quad Sets: AAROM;Right;5 reps;Seated (with towel roll under ankle)    General Comments General comments (skin integrity, edema, etc.): HRmax 160 bpm during mobility      Pertinent Vitals/Pain Pain Assessment: Faces Faces Pain Scale: Hurts little more Pain Location: RLE, stance phase Pain Descriptors / Indicators: Grimacing;Guarding Pain Intervention(s): Limited activity within patient's tolerance;Monitored during session;Repositioned    Home Living                      Prior Function            PT Goals (current goals can now be found in the care plan section) Acute Rehab PT Goals Patient Stated Goal: mother would like her to return home with mom at d/c PT Goal Formulation: With family Time For Goal Achievement: 06/19/20 Potential to Achieve Goals: Good Progress towards PT goals: Progressing toward goals    Frequency    Min 4X/week      PT Plan Current plan remains appropriate    Co-evaluation PT/OT/SLP Co-Evaluation/Treatment: Yes Reason for Co-Treatment: For patient/therapist safety;To address functional/ADL transfers;Complexity of the patient's impairments (multi-system involvement) PT goals addressed during session: Mobility/safety with mobility;Balance;Strengthening/ROM        AM-PAC PT "6 Clicks" Mobility   Outcome Measure  Help needed turning from your  back to your side while in a flat bed without using bedrails?: A Lot Help needed moving from lying on your back to sitting on the side of a flat bed without using bedrails?: A Lot Help needed moving to and from a bed to a chair (including a wheelchair)?: A Lot Help needed standing up from a chair using your arms (e.g., wheelchair or bedside chair)?: A Lot Help needed to walk in hospital room?: A Lot Help needed climbing 3-5 steps with a railing? : Total 6 Click Score: 11    End of Session Equipment Utilized During Treatment: Gait belt Activity Tolerance: Patient tolerated treatment well;Patient limited by fatigue Patient left: in chair;with call bell/phone within reach;with chair alarm set Nurse Communication: Mobility status PT Visit Diagnosis: Muscle weakness (generalized) (M62.81);Difficulty in walking, not elsewhere classified (R26.2);Other abnormalities of gait and mobility (R26.89) Hemiplegia - Right/Left: Right Hemiplegia - dominant/non-dominant: Dominant Hemiplegia - caused by: Cerebral infarction;Nontraumatic intracerebral hemorrhage     Time: 6195-0932 PT Time Calculation (min) (ACUTE ONLY): 40 min  Charges:  $Gait Training: 8-22 mins                     Elray Dains E, PT Acute Rehabilitation Services Pager [phone]  Office [phone]    Aksh Swart D Despina Hidden 06/05/2020, 10:44 AM

## 2020-06-05 NOTE — Progress Notes (Signed)
  Speech Language Pathology Treatment: Dysphagia;Cognitive-Linquistic  Patient Details Name: Gail Montgomery MRN: [id number] DOB: [birth year] Today's Date: 06/05/2020 Time: 1194-1740 SLP Time Calculation (min) (ACUTE ONLY): 55 min  Assessment / Plan / Recommendation Clinical Impression  Therapy focused on dysphagia and aphasia intervention, texture upgrade and education with pt and mom. Pt is eager to upgrade from fine chopped Dys texture and has demonstrated improvement from initial assessment in terms of complete oral clearance and absent s/s aspiration. Texture advanced to regular, continue thin liquids and recommend pills with thin. Language has improved as well. She was able to imitate 80% of phrases/sentences today needing written phrase completion and phonemic cues on occasion. Intermittent groping behaviors during initiation of words. Responsive naming questions to 50% accuracy ("what's your favorite color, soft drink etc). Use of melodic intonation therapy in addition to verbal and written cues not effective to read numbers 1-10. Verbal and visual example needed when writing parents name for larger letters with spaces (written language was accurate). Pt was pleasant, smiling, laughing and mildly childlike/immature in nature.  Provided education with mom throughout session and goal is for Roselene to attempt to verbalize her wants/thoughts before or in addition to gestures given additional time and cueing when needed.     HPI HPI: Pt is a 24 yo female presenting with sudden onset R sided weakness and aphasia. 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 She was intubated for thrombectomy 8/21. 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 MRI showed early subacute infarct of the left basal ganglia involving the posterior putamen and caudate body. Pt self-extubated 8/22. Pt tested (+) for COVID-19 but was asymptomatic upon arrival. 8/30 DVT. PMH: panic attack, anxiety      SLP  Plan  Continue with current plan of care       Recommendations  Diet recommendations: Regular;Thin liquid Liquids provided via: Cup;Straw Medication Administration: Whole meds with liquid Supervision: Patient able to self feed;Intermittent supervision to cue for compensatory strategies Compensations: Slow rate;Small sips/bites Postural Changes and/or Swallow Maneuvers: Seated upright 90 degrees                Oral Care Recommendations: Oral care BID Follow up Recommendations: Inpatient Rehab SLP Visit Diagnosis: Aphasia (R47.01);Dysphagia, unspecified (R13.10) Plan: Continue with current plan of care       GO                Royce Macadamia 06/05/2020, 5:24 PM  Breck Coons Lonell Face.Ed Nurse, children's [phone] Office [phone]

## 2020-06-05 NOTE — Progress Notes (Signed)
Student RN and Primary RN went to the patients room with the Nurse Tech to offer ambulation assistance from the chair to the bed.  The mother declined ambulation assistance. Primary RN, Student RN, and Nurse Tech left the room.

## 2020-06-05 NOTE — Plan of Care (Signed)
  Problem: Education: Goal: Knowledge of secondary prevention will improve Outcome: Not Progressing Goal: Knowledge of patient specific risk factors addressed and post discharge goals established will improve Outcome: Not Progressing Goal: Individualized Educational Video(s) Outcome: Not Progressing   Problem: Coping: Goal: Will identify appropriate support needs Outcome: Not Progressing   Problem: Self-Care: Goal: Verbalization of feelings and concerns over difficulty with self-care will improve Outcome: Not Progressing   Problem: Intracerebral Hemorrhage Tissue Perfusion: Goal: Complications of Intracerebral Hemorrhage will be minimized Outcome: Not Progressing   Problem: Ischemic Stroke/TIA Tissue Perfusion: Goal: Complications of ischemic stroke/TIA will be minimized Outcome: Not Progressing   Problem: Spontaneous Subarachnoid Hemorrhage Tissue Perfusion: Goal: Complications of Spontaneous Subarachnoid Hemorrhage will be minimized Outcome: Not Progressing   Problem: Education: Goal: Knowledge of General Education information will improve Description: Including pain rating scale, medication(s)/side effects and non-pharmacologic comfort measures Outcome: Not Progressing   Problem: Health Behavior/Discharge Planning: Goal: Ability to manage health-related needs will improve Outcome: Not Progressing   Problem: Clinical Measurements: Goal: Ability to maintain clinical measurements within normal limits will improve Outcome: Not Progressing Goal: Will remain free from infection Outcome: Not Progressing Goal: Diagnostic test results will improve Outcome: Not Progressing Goal: Respiratory complications will improve Outcome: Not Progressing Goal: Cardiovascular complication will be avoided Outcome: Not Progressing   Problem: Activity: Goal: Risk for activity intolerance will decrease Outcome: Not Progressing   Problem: Nutrition: Goal: Adequate nutrition will be  maintained Outcome: Not Progressing   Problem: Coping: Goal: Level of anxiety will decrease Outcome: Not Progressing   Problem: Elimination: Goal: Will not experience complications related to bowel motility Outcome: Not Progressing Goal: Will not experience complications related to urinary retention Outcome: Not Progressing   Problem: Pain Managment: Goal: General experience of comfort will improve Outcome: Not Progressing   Problem: Safety: Goal: Ability to remain free from injury will improve Outcome: Not Progressing   Problem: Skin Integrity: Goal: Risk for impaired skin integrity will decrease Outcome: Not Progressing

## 2020-06-06 ENCOUNTER — Encounter (HOSPITAL_COMMUNITY)
Admission: EM | Disposition: A | Discharge status: Rehabilitation Facility | Payer: Self-pay | Source: Home / Self Care | Attending: Neurology

## 2020-06-06 ENCOUNTER — Inpatient Hospital Stay (HOSPITAL_COMMUNITY): Payer: BC Managed Care – PPO | Admitting: Anesthesiology

## 2020-06-06 ENCOUNTER — Inpatient Hospital Stay (HOSPITAL_COMMUNITY): Payer: BC Managed Care – PPO

## 2020-06-06 ENCOUNTER — Encounter (HOSPITAL_COMMUNITY): Payer: Self-pay | Admitting: Neurology

## 2020-06-06 DIAGNOSIS — I639 Cerebral infarction, unspecified: Secondary | ICD-10-CM

## 2020-06-06 DIAGNOSIS — R768 Other specified abnormal immunological findings in serum: Secondary | ICD-10-CM

## 2020-06-06 DIAGNOSIS — Q211 Atrial septal defect: Secondary | ICD-10-CM

## 2020-06-06 HISTORY — PX: TEE WITHOUT CARDIOVERSION: SHX5443

## 2020-06-06 HISTORY — PX: BUBBLE STUDY: SHX6837

## 2020-06-06 LAB — BASIC METABOLIC PANEL
Anion gap: 8 (ref 5–15)
BUN: <5 mg/dL — ABNORMAL LOW (ref 6–20)
CO2: 24 mmol/L (ref 22–32)
Calcium: 8.6 mg/dL — ABNORMAL LOW (ref 8.9–10.3)
Chloride: 97 mmol/L — ABNORMAL LOW (ref 98–111)
Creatinine, Ser: 0.51 mg/dL (ref 0.44–1.00)
GFR calc Af Amer: >60 mL/min (ref >60)
GFR calc non Af Amer: >60 mL/min (ref >60)
Glucose, Bld: 103 mg/dL — ABNORMAL HIGH (ref 70–99)
Potassium: 4.2 mmol/L (ref 3.5–5.1)
Sodium: 129 mmol/L — ABNORMAL LOW (ref 135–145)

## 2020-06-06 LAB — CBC
HCT: 29.1 % — ABNORMAL LOW (ref 36.0–46.0)
Hemoglobin: 9.3 g/dL — ABNORMAL LOW (ref 12.0–15.0)
MCH: 28.4 pg (ref 26.0–34.0)
MCHC: 32 g/dL (ref 30.0–36.0)
MCV: 89 fL (ref 80.0–100.0)
Platelets: 503 10*3/uL — ABNORMAL HIGH (ref 150–400)
RBC: 3.27 MIL/uL — ABNORMAL LOW (ref 3.87–5.11)
RDW: 12.3 % (ref 11.5–15.5)
WBC: 6.2 10*3/uL (ref 4.0–10.5)
nRBC: 0 % (ref 0.0–0.2)

## 2020-06-06 LAB — PROTIME-INR
INR: 1.3 — ABNORMAL HIGH (ref 0.8–1.2)
Prothrombin Time: 15.6 seconds — ABNORMAL HIGH (ref 11.4–15.2)

## 2020-06-06 SURGERY — ECHOCARDIOGRAM, TRANSESOPHAGEAL
Anesthesia: Monitor Anesthesia Care

## 2020-06-06 MED ORDER — SODIUM CHLORIDE 0.9 % IV SOLN: INTRAVENOUS | Status: DC

## 2020-06-06 MED ORDER — ONDANSETRON HCL 4 MG/2ML IJ SOLN
INTRAMUSCULAR | Status: DC | PRN
  Administered 2020-06-06: 4 mg via INTRAVENOUS

## 2020-06-06 MED ORDER — PROPOFOL 10 MG/ML IV BOLUS
INTRAVENOUS | Status: DC | PRN
  Administered 2020-06-06: 30 mg via INTRAVENOUS

## 2020-06-06 MED ORDER — PROPOFOL 500 MG/50ML IV EMUL
INTRAVENOUS | Status: DC | PRN
  Administered 2020-06-06: 150 ug/kg/min via INTRAVENOUS

## 2020-06-06 MED ORDER — MIDAZOLAM HCL 2 MG/2ML IJ SOLN
INTRAMUSCULAR | Status: DC | PRN
  Administered 2020-06-06: 2 mg via INTRAVENOUS

## 2020-06-06 MED ORDER — SODIUM CHLORIDE 0.9 % IV SOLN: INTRAVENOUS | Status: DC | PRN

## 2020-06-06 MED ORDER — BUTAMBEN-TETRACAINE-BENZOCAINE 2-2-14 % EX AERO
INHALATION_SPRAY | CUTANEOUS | Status: DC | PRN
  Administered 2020-06-06: 2 via TOPICAL

## 2020-06-06 NOTE — Progress Notes (Signed)
IP rehab admissions - We do have authorization for CIR admission.  Noted TEE done today.  Will await follow up from TEE and continue to follow for medical readiness for CIR admission.  Call me for questions.  [phone]

## 2020-06-06 NOTE — Progress Notes (Addendum)
PROGRESS NOTE    Gail Montgomery  WNU:[id number] DOB: [birth year] DOA: 05/19/2020 PCP: No primary care provider on file.    No chief complaint on file.   Brief Narrative:   24 year old female who was admitted to the hospital on 05/19/20 with sudden onset of right hemiparesis and aphasia. She presented as a code stroke with acute left MCA Infarct. She received TPA at 11:29 am, underwent thrombectomy  With reperfusion, but Left ACA had residual small distal anterior cerebral clot.  During the procedure there was an ICA vasospasm versus dissection initially thought she needs a stent however upon reimaging the vessel was open.  She was loaded with aspirin and Plavix.  She was also found to be Covid positive but was asymptomatic.  Her hospital course was complicated by [facility], parenchymal hemorrhage and interval ventricular hemorrhage.  She completed the course of treatment for E. coli UTI.  She was also found to have a DVT/PE and was started on IV heparin and eventually transitioned to Eliquis on 06/01/2020.  She underwent TEE today showing PFO.  She will be follow-up with cardiology as an outpatient for PFO closure. She is currently waiting for a bed at CIR. 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 Stroke work-up done and TEE showed PFO.  Given PE and DVTs possible that she has.  Paradoxical embolic infarction with PFO. She will need to follow-up with cardiology regarding PFO closure at a later time.  Plan for inpatient rehab after the TEE.    Right lower extremity DVT and bilateral acute PE On Eliquis.   Low-grade fevers Possibly related to blood clot burdens.   E. coli UTI Completed the course of IV antibiotics.    Sick euthyroid syndrome Follow-up  with thyroid panel in 4 to 6 weeks.    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      DVT prophylaxis: (Eliquis) Code Status: (Full code. ) Family Communication: Mom at bedside.  Disposition:   Status is: Inpatient  Remains inpatient appropriate because:Unsafe d/c plan, waiting for a bed at CIR  Dispo: The patient is from: Home              Anticipated d/c is to: CIR              Anticipated d/c date is: 1 day              Patient currently is medically stable to d/c.       Consultants:   Neurology  Cardiology     Procedures:   TEE  Echocardiogram.      Antimicrobials: none.    Subjective: No new complaints.   Objective: Vitals:   06/06/20 1235 06/06/20 1245 06/06/20 1321 06/06/20 1407  BP: 118/72 122/73 121/77 (!) 118/98  Pulse: 83 80 91 100  Resp: 19 20 18    Temp: 99.5 F (37.5 C)  98.3 F (36.8 C) 98.2 F (36.8 C)  TempSrc: Axillary  Oral   SpO2: 100% 100% 97% 98%  Weight:      Height:       No intake or output data in the 24 hours ending 06/06/20 1449 Filed Weights   05/19/20 1100 05/29/20  0400 06/06/20 1106  Weight: 84.2 kg 85.1 kg 85.1 kg    Examination:  General exam: Appears calm and comfortable  Respiratory system: Clear to auscultation. Respiratory effort normal. Cardiovascular system: S1 & S2 heard, RRR. No JVD,  No pedal edema. Gastrointestinal system: Abdomen is nondistended, soft and nontender. Normal bowel sounds heard. Central nervous system: Alert , non verbal, follow simple commands, RUE 4/5, RLE 3/5 Skin: No rashes, lesions or ulcers Psychiatry: Mood & affect appropriate.     Data Reviewed: I have personally reviewed following labs and imaging studies  CBC: Recent Labs  Lab 06/02/20 0101 06/03/20 0326 06/04/20 0114 06/05/20 0514 06/06/20 0135  WBC 8.3 8.2 7.2 6.6 6.2  HGB 8.9* 9.4* 9.7* 9.8* 9.3*  HCT 27.4* 29.1* 29.5* 31.0*  29.1*  MCV 86.7 87.1 88.1 90.6 89.0  PLT 485* 533* 547* 514* 503*    Basic Metabolic Panel: Recent Labs  Lab 05/31/20 0828 05/31/20 0828 06/01/20 0325 06/01/20 0325 06/02/20 0101 06/03/20 0326 06/04/20 0114 06/05/20 0514 06/06/20 0135  NA 129*   < > 131*   < > 126* 127* 132* 130* 129*  K 3.7   < > 4.3   < > 3.5 4.1 4.3 4.3 4.2  CL 99   < > 97*   < > 96* 95* 99 98 97*  CO2 19*   < > 21*   < > 19* 21* 22 22 24   GLUCOSE 102*   < > 97   < > 119* 108* 107* 98 103*  BUN 9   < > 7   < > 7 <5* <5* <5* <5*  CREATININE 0.51   < > 0.58   < > 0.54 0.50 0.56 0.62 0.51  CALCIUM 8.4*   < > 8.8*   < > 8.6* 8.9 8.8* 8.8* 8.6*  MG 2.0  --  2.0  --  1.8 2.0 1.9  --   --    < > = values in this interval not displayed.    GFR: Estimated Creatinine Clearance: 116.5 mL/min (by C-G formula based on SCr of 0.51 mg/dL).  Liver Function Tests: Recent Labs  Lab 06/01/20 0325 06/02/20 0101 06/03/20 0326 06/04/20 0114 06/05/20 0514  AST 83* 77* 96* 64* 47*  ALT 90* 92* 115* 102* 71*  ALKPHOS 46 45 52 52 48  BILITOT 0.4 0.3 0.4 0.5 0.4  PROT 7.6 7.2 7.7 7.8 7.8  ALBUMIN 2.2* 2.1* 2.2* 2.1* 2.2*    CBG: Recent Labs  Lab 05/31/20 0748 05/31/20 1944 05/31/20 2324 06/01/20 0353  GLUCAP 101* 100* 111* 90     Recent Results (from the past 240 hour(s))  Culture, blood (routine x 2)     Status: None   Collection Time: 05/28/20  4:39 AM   Specimen: BLOOD RIGHT HAND  Result Value Ref Range Status   Specimen Description BLOOD RIGHT HAND  Final   Special Requests   Final    BOTTLES DRAWN AEROBIC ONLY Blood Culture results may not be optimal due to an inadequate volume of blood received in culture bottles   Culture   Final    NO GROWTH 5 DAYS Performed at [facility], [address] [place], [place]    Report Status 06/02/2020 FINAL  Final  Culture, blood (routine x 2)     Status: None   Collection Time: 05/28/20  4:39 AM   Specimen: BLOOD  Result Value Ref Range  Status   Specimen Description BLOOD RIGHT ARM  Final   Special Requests   Final    BOTTLES DRAWN AEROBIC ONLY Blood Culture results may not be optimal due to an inadequate volume of blood received in culture bottles   Culture   Final    NO GROWTH 5 DAYS Performed at [facility], [address], [place], [place] [address]    Report Status 06/02/2020 FINAL  Final  Culture, Urine     Status: Abnormal   Collection Time: 05/28/20  1:51 PM   Specimen: Urine, Random  Result Value Ref Range Status   Specimen Description URINE, RANDOM  Final   Special Requests   Final    NONE Performed at [facility], [address], [place], [place] [address]    Culture >=100,000 COLONIES/mL ESCHERICHIA COLI (A)  Final   Report Status 05/30/2020 FINAL  Final   Organism ID, Bacteria ESCHERICHIA COLI (A)  Final      Susceptibility   Escherichia coli - MIC*    AMPICILLIN >=32 RESISTANT Resistant     CEFAZOLIN 16 SENSITIVE Sensitive     CEFTRIAXONE <=0.25 SENSITIVE Sensitive     CIPROFLOXACIN <=0.25 SENSITIVE Sensitive     GENTAMICIN >=16 RESISTANT Resistant     IMIPENEM <=0.25 SENSITIVE Sensitive     NITROFURANTOIN <=16 SENSITIVE Sensitive     TRIMETH/SULFA <=20 SENSITIVE Sensitive     AMPICILLIN/SULBACTAM >=32 RESISTANT Resistant     PIP/TAZO <=4 SENSITIVE Sensitive     * >=100,000 COLONIES/mL ESCHERICHIA COLI  Culture, blood (routine x 2)     Status: None (Preliminary result)   Collection Time: 06/03/20  3:30 AM   Specimen: BLOOD  Result Value Ref Range Status   Specimen Description BLOOD LEFT ANTECUBITAL  Final   Special Requests   Final    BOTTLES DRAWN AEROBIC AND ANAEROBIC Blood Culture adequate volume   Culture   Final    NO GROWTH 3 DAYS Performed at [facility], [address], [place], [place] [address]    Report Status PENDING  Incomplete  Culture, blood (routine x 2)     Status: None (Preliminary result)   Collection Time: 06/03/20  3:35 AM   Specimen: BLOOD    Result Value Ref Range Status   Specimen Description BLOOD RIGHT ANTECUBITAL  Final   Special Requests   Final    BOTTLES DRAWN AEROBIC AND ANAEROBIC Blood Culture adequate volume   Culture   Final    NO GROWTH 3 DAYS Performed at [facility], [address], [place], [place] [address]    Report Status PENDING  Incomplete         Radiology Studies: No results found.      Scheduled Meds: . apixaban  5 mg Oral BID  . atorvastatin  20 mg Oral Daily  . Chlorhexidine Gluconate Cloth  6 each Topical Daily  . folic acid  1 mg Oral Daily  . mouth rinse  15 mL Mouth Rinse BID  . multivitamin  1 tablet Oral Daily  . niMODipine  60 mg Oral Q4H  . pantoprazole  40 mg Oral Daily  . thiamine  100 mg Oral Daily  . vitamin B-12  500 mcg Oral Daily   Continuous Infusions:   LOS: 18 days       Kathlen Mody, MD [facility]   To contact the attending provider between 7A-7P or the covering provider during after hours 7P-7A, please log into the web site [email or web address] and access using universal  [place] password for that web site. If you do not have the password, please call the hospital operator.  06/06/2020, 2:49 PM

## 2020-06-06 NOTE — Progress Notes (Signed)
  Echocardiogram Echocardiogram Transesophageal has been performed.  Leta Jungling M 06/06/2020, 12:24 PM

## 2020-06-06 NOTE — Progress Notes (Addendum)
STROKE TEAM PROGRESS NOTE   INTERVAL HISTORY Patient is resting in bed with her mother at the bedside. Per her mother, she just completed a session with PT. The patient is sleeping but rouses when the examiner speaks to her. Mid examination, the TEE team arrived to take the patient for her TEE.  OBJECTIVE Vitals:   06/05/20 1625 06/05/20 2201 06/06/20 0817 06/06/20 1106  BP: 121/83 117/62 105/63 122/79  Pulse: (!) 120 (!) 110 94 93  Resp: 17 14 16 14   Temp: 99.6 F (37.6 C) 99.1 F (37.3 C) 99 F (37.2 C) 98.4 F (36.9 C)  TempSrc:  Oral  Oral  SpO2: 94% 100% 98% 98%  Weight:    85.1 kg  Height:    5\' 4"  (1.626 m)   CBC:  Recent Labs  Lab 06/05/20 0514 06/06/20 0135  WBC 6.6 6.2  HGB 9.8* 9.3*  HCT 31.0* 29.1*  MCV 90.6 89.0  PLT 514* 503*   Basic Metabolic Panel:  Recent Labs  Lab 06/03/20 0326 06/03/20 0326 06/04/20 0114 06/04/20 0114 06/05/20 0514 06/06/20 0135  NA 127*   < > 132*   < > 130* 129*  K 4.1   < > 4.3   < > 4.3 4.2  CL 95*   < > 99   < > 98 97*  CO2 21*   < > 22   < > 22 24  GLUCOSE 108*   < > 107*   < > 98 103*  BUN <5*   < > <5*   < > <5* <5*  CREATININE 0.50   < > 0.56   < > 0.62 0.51  CALCIUM 8.9   < > 8.8*   < > 8.8* 8.6*  MG 2.0  --  1.9  --   --   --    < > = values in this interval not displayed.   Lipid Panel:     Component Value Date/Time   CHOL 156 05/20/2020 0958   TRIG 105 05/20/2020 0958   TRIG 104 05/20/2020 0958   HDL 57 05/20/2020 0958   CHOLHDL 2.7 05/20/2020 0958   VLDL 21 05/20/2020 0958   LDLCALC 78 05/20/2020 0958   HgbA1c:  Lab Results  Component Value Date   HGBA1C 5.6 05/20/2020   Urine Drug Screen:     Component Value Date/Time   LABOPIA NONE DETECTED 05/20/2020 1736   COCAINSCRNUR NONE DETECTED 05/20/2020 1736   LABBENZ NONE DETECTED 05/20/2020 1736   AMPHETMU NONE DETECTED 05/20/2020 1736   THCU POSITIVE (A) 05/20/2020 1736   LABBARB NONE DETECTED 05/20/2020 1736    Alcohol Level No results found  [facility]  [facility] HEAD CODE STROKE WO CONTRAST 05/19/2020 Dense left ICA terminus and MCA. No acute hemorrhage. ASPECTS is 10.   CT Code Stroke CTA Head W/WO contrast CT Code Stroke CTA Neck W/WO contrast 05/19/2020 1. 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  2. Mildly indistinct appearance of the proximal left ICA is likely from motion artifact, consider cervical run. No atheromatous changes or vasculopathy seen in the neck.   Neuro Interventional Radiology - Cerebral Angiogram with Intervention - Dr Loreta AveWagner 05/19/20 2:27 PM Left MCA: Baseline TICI 0 First Pass Device:                 Local aspiration and solitaire 4 x 40              Result  TICI 0 Second Pass Device:           Local aspiration and solitaire 4 x 40              Result                         TICI 3 Left ACA/pericallosal  Baseline TICI 0,    Final TICI 0 pericallosal artery Findings:  Patent right CFA Left M1 occlusion, final is TICI 3  Left ACA A2 occlusion migrated to pericallosal artery during the case.  Could not complete a first pass safely.  Significant irregularity of the cervical ICA, concerning for dissection, that resolved after observation, and once DAPT was initiated.  The final appearance may represent spasm vs FMD/vasculitis  CT HEAD WO CONTRAST 05/19/2020 New large volume of subarachnoid and intraventricular hemorrhage.  CT HEAD WO CONTRAST 05/20/2020 1. Subarachnoid hemorrhage along the anterior interhemispheric fissure with slightly increased extension into both lateral ventricles.  2. Slight increase in size of the temporal horn of the left lateral ventricle consistent with early communicating hydrocephalus.   MR BRAIN WO CONTRAST 05/20/2020 1. Early subacute infarct of the left basal ganglia involving the posterior putamen and caudate body.  2. Subarachnoid hemorrhage along the interhemispheric fissure extending into the lateral  ventricles.  3. 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   CT HEAD WO CONTRAST 05/21/2020 1. Stable appearance of left basal ganglia nonhemorrhagic infarction. 2. Stable medial left frontal extra-axial hemorrhage, extending into the ventricles bilaterally. 3. Subarachnoid hemorrhage posteriorly on the left and along the inter cerebral hemisphere likely represents redistribution without significant new hemorrhage. 4. Hemorrhage is present within the aqueduct of Sylvius. 5. Progressive dilation of the ventricles compatible with developing hydrocephalus. 6. No new areas of hemorrhage.   CT HEAD WO CONTRAST 05/22/2020 1. Regressed intraventricular hemorrhage since yesterday with small volume residual. Stable mild ventriculomegaly, and possible mild transependymal edema. 2. Stable left inferior frontal gyrus parasagittal hematoma (approximately 12 mL) and regional edema. No significant midline shift, basilar cisterns are patent. 3. Stable left basal ganglia infarcts. 4. No new intracranial abnormality.   CT HEAD WO CONTRAST 05/24/20 IMPRESSION: 1. 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 2. Unchanged intracranial hemorrhage with mild lateral ventriculomegaly.  CT HEAD WO CONTRAST 06/01/20 IMPRESSION: 1. Focal hematoma in the anterior interhemispheric fissure continues to become less dense. 2. Infarction in the left cingulate gyrus and left posterior putamen and caudate nucleus as seen previously. Question some infarction also of the left temporal lobe.  CT ABDOMEN AND PELVIS W CONTRAST 06/02/20 IMPRESSION: 1. No acute findings or explanation for the patient's symptoms. 2. Probable nonocclusive thrombus in the right femoral vein. Grossly stable known bilateral lower lobe pulmonary emboli, similar to recent chest CTA.  ECHOCARDIOGRAM COMPLETE 05/20/2020 1. Left ventricular ejection fraction, by estimation, is 60 to 65%. The left  ventricle has normal function. The left ventricle has no regional wall motion abnormalities. Left ventricular diastolic parameters were normal.   2. Right ventricular systolic function is normal. The right ventricular size is normal.   3. The mitral valve is normal in structure. Trivial mitral valve regurgitation. No evidence of mitral stenosis.   4. The aortic valve is normal in structure. Aortic valve regurgitation is not visualized. No aortic stenosis is present.   5. The inferior vena cava is normal in size with greater than 50% respiratory variability, suggesting right atrial pressure  of 3 mmHg.   Bilateral Lower Extremity Venous Dopplers  05/21/2020 RIGHT: - There is no evidence of deep vein thrombosis in the lower extremity. However, portions of this examination were limited- see technologist comments above.  - No cystic structure found in the popliteal fossa.   LEFT: - There is no evidence of deep vein thrombosis in the lower extremity. However, portions of this examination were limited- see technologist comments above.  - No cystic structure found in the popliteal fossa.    Transcranial Doppler w/ Bubble 05/25/2020 Not cooperative for Valsalva.  Bubble study showed Spencer degree 3 at rest.  Recommend TEE for further confirmation.  CT head 05/29/2020 :Interval decrease in the size/conspicuity of intraparenchymal, extra-axial, and intraventricular hemorrhage, as above. No new acute hemorrhage.  EEG 06/01/20 ABNORMALITY - Continuous slow, generalized and lateralized left hemisphere - Intermittent rhythmic delta slowing, left hemisphere, maximal left posterior quadrant IMPRESSION: This technically difficult study is suggestive of cortical dysfunction in left hemisphere, maximal left posterior quadrant.  Lateralized rhythmic delta activity was seen in left hemisphere which is on the ictal-interictal continuum with low potential for seizures.  Additionally, there is evidence of moderate  diffuse encephalopathy, nonspecific etiology.  No seizures or epileptiform discharges were seen throughout the recording.  PHYSICAL EXAM    Temp:  [98.4 F (36.9 C)-99.9 F (37.7 C)] 98.4 F (36.9 C) (09/08 1106) Pulse Rate:  [93-120] 93 (09/08 1106) Resp:  [14-17] 14 (09/08 1106) BP: (105-122)/(62-83) 122/79 (09/08 1106) SpO2:  [94 %-100 %] 98 % (09/08 1106) Weight:  [85.1 kg] 85.1 kg (09/08 1106)  General -mildly obese young African-American lady  Ophthalmologic - fundi not visualized due to noncooperation.   Cardiovascular - regular rhythm and mild tachycardia  Neuro - Awake alert with flat affect, eyes open, largely nonverbal but was able to state her name, the year and nodded yes to hospital when given location choices. She is able to follow simple commands ( makes fist, opens it, shows two fingers gives thumbs up) No gaze deviation, blinking to visual threat bilaterally, PERRL. Right facial droop. Tongue protrusion midline. LUE 5/5 and LLE 5/5. RUE 4+/5. RLE proximal 3 -/5, knee extension 3/5, distal PF/DF 1/5, on brace. Sensation, coordination not cooperative and gait not tested.   ASSESSMENT/PLAN Gail Montgomery is a 24 y.o. female with history of tobacco use, anxiety (panic attacks) and hormonal birth control presented with right-sided weakness and aphasia that started abruptly at 9:30 AM.  COVID - positive. IV t-PA - Saturday 05/19/20 at 1145. Thrombectomy - Dr Loreta Ave - Left M1 occlusion, final is TICI 3.   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 Paradoxical emboli in the setting of PFO and OCP use and intermittent smokeing 2. COVID hypercoagulability - however, COVID Ab high titer    CT Head - Dense left ICA terminus and MCA. No acute hemorrhage. ASPECTS is 10.      CTA H&N - Emergent large vessel occlusion at  the left ICA terminus continuing into the MCA. Mildly indistinct appearance of the proximal left ICA is likely from motion artifact, consider cervical run. No atheromatous changes or vasculopathy seen in the neck.  IR - left MCA and A2 occlusion s/p TICI3 of MCA and TICI0 of ACA. Initially left ICA stenosis post IR but resolved on the final  run  CT head 8/21 - New large volume of subarachnoid and intraventricular hemorrhage.   CT head 8/22 [facility] along the anterior interhemispheric fissure with slightly increased extension into both lateral ventricles. Slight increase in size of the temporal horn of the left lateral ventricle consistent with early communicating hydrocephalus.   MRI head 8/22 -  Early subacute infarct of the left basal ganglia involving the posterior putamen and caudate body.   CT head 8/23 - stable L basal ganglia infarct, ICH/IVH/SAH. Progressive dilation of ventricles.  CT head 8/24 - regressed IVH. Stable infarct and frontal ICH. Stable ventriculomegaly  CT head 8/26 - stable infarcts & hemorrhage w/ mild ventriculomegaly   CT - 06/01/20 - frontal hematoma less dense. Stable left MCA infarct  CT Abd and Pelvis 06/02/20 - No acute findings or explanation for the patient's symptoms. Probable nonocclusive thrombus in the right femoral vein. Grossly stable known bilateral lower lobe pulmonary emboli, similar to recent chest CTA.  EEG - 06/01/20 Lateralized rhythmic delta activity was seen in left hemisphere which is on the ictal-interictal continuum with low potential for seizures.  2D Echo EF 60-65%  TCD bubble study Spencer degree 3 at rest.  Not cooperative for Valsalva.  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  Sars Corona Virus 2 - positive  LDL - 78  HgbA1c 5.6  UDS - THC positive  VTE prophylaxis - SCDs  No antithrombotic prior to admission, now on No antithrombotic due to Gail Montgomery and  IVH -> Eliquis started 9/3 she will take this for 6 months for DVT   Ongoing aggressive stroke risk factor management  Therapy recommendations:  CIR   Disposition:  Pending   Hydrocephalus, mild  CT head showed mildly increased size of left temporal horn   CT repeat stable hemorrhage but mild progression of hydrocephalus  CT head 8/24 - regressed IVH. Stable infarct and frontal ICH. Stable mild ventriculomegaly  NSG on board  3% hypertonic saline - 50 cc/hr ->25cc->10cc -> off  PICC placed 8/23   Repeat CT on 8/26 and 9/3 stable  DVT and PE  8/23 LE venous Doppler negative for DVT  8/30 LE venous Doppler showed acute right LE DVT   8/30 CTA chest bilateral segmental lower lobe PE, left greater than right  Was on heparin IV, transitioned to Eliquis 9/3  INR 1.2->>10.0->1.2  PFO  TCD bubble study showed Spencer degree 3 at rest.  Not cooperative with Valsalva  TEE showed atrial septum is aneurysmal and demonstrates spontaneous bidirectional shunting by color doppler and saline microbubble consistent with moderate-sized PFO.   Will refer to Dr. Excell Seltzer cardiology for PFO closure as outpt.  BP management  Stable  On Nimodipine for [facility] - discussed with Dr. Conchita Paris, will keep nimodipine while inpt - d/c on discharge if neuro stable  SBP goal < 160 . Long-term BP goal normotensive  Hyperlipidemia  Home Lipid lowering medication: none   LDL 78, goal < 70  AST/ALT 96/115->64/102->47/71  On Lipitor 20  Continue statin at discharge  ? Hypercoagulable state   COVID positive - asymptomatic  D-dimer > 20->16.13->17.44-> >20-> >20->12.89->6.37->5.4->6.02->5.86->5.68  OCP user with smoking and THC use  Hypercoagulable work up positive ANCA proteinase 3 - 6.0 (H) (0.0 - 3.5) and positive ANA  Repeat above as outpatient  COVID - 19 infection  COVID + test 8/21  Asymptomatic  Ferritin 24->30->37->51->93->92->12 5  Fibrinogen 289  CRP  0.8->2.4->3.3->3.9->8.7->9.1->28.8->17.3->13.5->11.5->9.4  D-dimer > 20->16.13->17.44-> >20-> >20 ->12.89->6.37->5.4->6.02->5.86->5.68  COVID Ab titer > 2500  off isolation now per ID  Leukocytosis, Fever, tachycardia  Etiology unclear - may be due to PE  TMax 100.6 -> afebrile  WBC 10.9->12.2->12.5->11.0->14.0->13.9->17.0...11.8->8.3->8.2->7.2->7.2->6.6  HR 100-120s  UA small LE, rare bact, 0-5 WBC  CXR neg  Urine culture - 8/30 E. Coli   Blood Cx - 8/25  And 8/30 all neg  Blood Cultures 9/5 - NGTD  Vanco and zosyn empiric treatment 8/25>>8/27  Rocephin 9/2 >>9/4  Euthyroid sick syndrome  Low TSH 0.054   normal free T4 and T3  Likely related to critical illness  Repeat TSH as outpt  Tobacco abuse  Intermittent smoking per mom  Smoking cessation counseling will be provided  Other Stroke Risk Factors  ETOH use, advised to drink no more than 1 alcoholic beverage per day  Obesity, Body mass index is 32.2 kg/m., recommend weight loss, diet and exercise as appropriate   Hormonal birth control - both progesterone and estrogen - Estarylla  Other Active Problems  Code status - Full code  Low normal B12 level 300 - supplement  Intermittent diarrhea - imodium PRN  SIADH d/t stroke 126->127->132->130  Hospital day # 18    Stark Jock, NP  [facility] Nurse Practitioner  Patient seen and discussed with attending physician Dr. Roda Shutters 06/06/2020 12:11 PM    ATTENDING NOTE: I reviewed above note and agree with the assessment and plan.   Pt no acute event overnight. 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 Also has prominent RA chiari network. She would be a candidate for PFO closure. Will refer to Dr. Excell Seltzer for consideration of PFO closure.   Otherwise, she is clinically stable, no more fever or leukocytosis. Still has mild hyponatremia. Will continue eliquis  and statin. OK to d/c nimodipine on discharge or at day 21. Recommend alternative means for contraception, recommend to repeat TSH, ANA at outpt follow up. She will see Dr. Pearlean Brownie at [facility] for stroke follow up.   Neurology will sign off. Please call with questions. Pt will follow up with stroke clinic Dr. Pearlean Brownie at [facility] in about 4 weeks. Thanks for the consult.   Marvel Plan, MD PhD Stroke Neurology 06/06/2020 2:55 PM     To contact Stroke Continuity provider, please refer to [email or web address]. After hours, contact General Neurology

## 2020-06-06 NOTE — Anesthesia Preprocedure Evaluation (Signed)
Anesthesia Evaluation  Patient identified by MRN, date of birth, ID band Patient awake    Reviewed: Allergy & Precautions, H&P , NPO status , Patient's Chart, lab work & pertinent test results  Airway Mallampati: III  TM Distance: >3 FB Neck ROM: Full    Dental no notable dental hx. (+) Teeth Intact, Dental Advisory Given   Pulmonary neg pulmonary ROS, Current Smoker,    Pulmonary exam normal breath sounds clear to auscultation       Cardiovascular negative cardio ROS   Rhythm:Regular Rate:Normal     Neuro/Psych Anxiety Aphasia R side weakness CVA, Residual Symptoms    GI/Hepatic negative GI ROS, Neg liver ROS,   Endo/Other  negative endocrine ROS  Renal/GU negative Renal ROS  negative genitourinary   Musculoskeletal   Abdominal (+) + obese,   Peds  Hematology negative hematology ROS (+)   Anesthesia Other Findings   Reproductive/Obstetrics negative OB ROS                             Anesthesia Physical  Anesthesia Plan  ASA: III  Anesthesia Plan: MAC   Post-op Pain Management:    Induction: Intravenous, Rapid sequence and Cricoid pressure planned  PONV Risk Score and Plan: 3 and Ondansetron, Dexamethasone, Treatment may vary due to age or medical condition, Midazolam and Propofol infusion  Airway Management Planned: Natural Airway  Additional Equipment: None  Intra-op Plan:   Post-operative Plan:   Informed Consent: I have reviewed the patients History and Physical, chart, labs and discussed the procedure including the risks, benefits and alternatives for the proposed anesthesia with the patient or authorized representative who has indicated his/her understanding and acceptance.     Dental advisory given  Plan Discussed with: CRNA  Anesthesia Plan Comments:         Anesthesia Quick Evaluation

## 2020-06-06 NOTE — Progress Notes (Signed)
Physical Therapy Treatment Patient Details Name: Gail Montgomery MRN: [id number] DOB: Apr 22, 1996 Today's Date: 06/06/2020    History of Present Illness 24 yo who had acute R sided weakness and aphasia 05/19/20 due to LVO at [facility]. Screened Covid + but asymptomatic; tPA and underwent thrombectomy; during procedure had ICA vasospasm vs dissection.  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 Pt self extubated 05/20/20.     PT Comments    Pt progressing well with gait training, participating in 2x10 ft bouts with mod +2 assist. Pt required seated rest break to recover fatigue, but is getting better at following verbal cues and sequencing stepping. Pt verbalized x2 to PT today, and PT encouraged pt to continue to communicate verbally throughout session. Pt presenting with heel cord and hamstring tightness, PT assisting with heel cord and hamstring stretching. PT encouraged pt's mother to assist pt with stretching daily, pt's mother agrees to assist. Pt remains an excellent CIR candidate, will continue to follow acutely.     Follow Up Recommendations  CIR     Equipment Recommendations  Wheelchair (measurements PT);Wheelchair cushion (measurements PT);Hospital bed;3in1 (PT)    Recommendations for Other Services       Precautions / Restrictions Precautions Precautions: Fall Precaution Comments: right sided weakness (R leg most significantly) Restrictions Weight Bearing Restrictions: No    Mobility  Bed Mobility Overal bed mobility: Needs Assistance Bed Mobility: Supine to Sit     Supine to sit: Min assist;HOB elevated Sit to supine: Mod assist   General bed mobility comments: Min assist for supine>sit for RLE management, Mod assist for return to supine for RLE lifting into bed and trunk lowering. Assist for boost up in bed with use of bed  pads.  Transfers Overall transfer level: Needs assistance Equipment used: 2 person hand held assist;1 person hand held assist Transfers: Sit to/from [employer] Sit to Stand: Mod assist;+2 physical assistance Stand pivot transfers: Mod assist       General transfer comment: MOd +2 for sit to stand in preparation to ambulate, with bilateral UEs draped on PT and PT aide shoulders. Assist for RLE blocking, rise, and steady. Mod assist for stand pivot to recliner for RLE blocking, steadying, pivotal step to [facility] towards pt R.  Ambulation/Gait Ambulation/Gait assistance: Mod assist;+2 physical assistance Gait Distance (Feet): 10 Feet (2x10 ft) Assistive device: 2 person hand held assist Gait Pattern/deviations: Step-to pattern;Decreased step length - right;Decreased dorsiflexion - right;Trunk flexed;Decreased stance time - right;Narrow base of support Gait velocity: decr   General Gait Details: Mod +2 for steadying, blocking RLE during stance phase and facilitating RLE during swing phase of gait, intermittent blocking LLE for buckling during stance phase, and guiding pt to desination surface. verbal cuing for sequencing, responds best to short cues "step right, wait for me, step left"   Stairs             Wheelchair Mobility    Modified Rankin (Stroke Patients Only) Modified Rankin (Stroke Patients Only) Pre-Morbid Rankin Score: No symptoms Modified Rankin: Moderately severe disability     Balance Overall balance assessment: Needs assistance Sitting-balance support: Feet supported;Single extremity supported Sitting balance-Leahy Scale: Fair Sitting balance - Comments: able to sit EOB without PT support   Standing balance support: During functional activity;Single extremity supported Standing balance-Leahy Scale: Poor Standing balance comment: reliant on external assist  Cognition Arousal/Alertness:  Awake/alert Behavior During Therapy: WFL for tasks assessed/performed Overall Cognitive Status: Difficult to assess Area of Impairment: Attention;Following commands;Safety/judgement;Problem solving                   Current Attention Level: Selective   Following Commands: Follows one step commands with increased time Safety/Judgement: Decreased awareness of safety Awareness: Emergent Problem Solving: Slow processing;Difficulty sequencing;Requires tactile cues General Comments: Pt spoke to PT today, stating "I was worried by nails were digging into you (during gait)", and states "my knee and hip" when PT asks where her pain is. Pt continuing to require short, multimodal cuing for mobility tasks      Exercises General Exercises - Lower Extremity Short Arc Quad: AAROM;Right;10 reps;Supine Other Exercises Other Exercises: knee extension with heel cord stretch, 15 second holds, x5 Other Exercises: PT encouraged pt's mother to assist pt in performing sustained DF stretch x5-10 seconds, repeated bouts, to pt comfort    General Comments General comments (skin integrity, edema, etc.): HRmax observed 114 bpm      Pertinent Vitals/Pain Pain Assessment: Faces Faces Pain Scale: Hurts little more Pain Location: RLE, during stretching Pain Descriptors / Indicators: Grimacing;Guarding Pain Intervention(s): Limited activity within patient's tolerance;Monitored during session;Repositioned    Home Living                      Prior Function            PT Goals (current goals can now be found in the care plan section) Acute Rehab PT Goals Patient Stated Goal: mother would like her to return home with mom at d/c PT Goal Formulation: With patient Time For Goal Achievement: 06/19/20 Potential to Achieve Goals: Good Progress towards PT goals: Progressing toward goals    Frequency    Min 4X/week      PT Plan Current plan remains appropriate    Co-evaluation               AM-PAC PT "6 Clicks" Mobility   Outcome Measure  Help needed turning from your back to your side while in a flat bed without using bedrails?: A Lot Help needed moving from lying on your back to sitting on the side of a flat bed without using bedrails?: A Lot Help needed moving to and from a bed to a chair (including a wheelchair)?: A Lot Help needed standing up from a chair using your arms (e.g., wheelchair or bedside chair)?: A Lot Help needed to walk in hospital room?: A Lot Help needed climbing 3-5 steps with a railing? : Total 6 Click Score: 11    End of Session Equipment Utilized During Treatment: Gait belt Activity Tolerance: Patient tolerated treatment well Patient left: with call bell/phone within reach;in bed;with bed alarm set Nurse Communication: Mobility status PT Visit Diagnosis: Muscle weakness (generalized) (M62.81);Difficulty in walking, not elsewhere classified (R26.2);Other abnormalities of gait and mobility (R26.89) Hemiplegia - Right/Left: Right Hemiplegia - dominant/non-dominant: Dominant Hemiplegia - caused by: Cerebral infarction;Nontraumatic intracerebral hemorrhage     Time: 3220-2542 PT Time Calculation (min) (ACUTE ONLY): 23 min  Charges:  $Gait Training: 8-22 mins $Therapeutic Exercise: 8-22 mins                     Yanira Tolsma E, PT Acute Rehabilitation Services Pager [phone]  Office [phone]     Shaday Rayborn D Despina Hidden 06/06/2020, 11:31 AM

## 2020-06-06 NOTE — Transfer of Care (Signed)
Immediate Anesthesia Transfer of Care Note  Patient: Gail Montgomery  Procedure(s) Performed: TRANSESOPHAGEAL ECHOCARDIOGRAM (TEE) (N/A ) BUBBLE STUDY  Patient Location: Endoscopy Unit  Anesthesia Type:MAC  Level of Consciousness: drowsy  Airway & Oxygen Therapy: Patient Spontanous Breathing  Post-op Assessment: Report given to RN and Post -op Vital signs reviewed and stable  Post vital signs: Reviewed and stable  Last Vitals:  Vitals Value Taken Time  BP 119/69 06/06/20 1215  Temp    Pulse 94 06/06/20 1215  Resp 23 06/06/20 1215  SpO2 97 % 06/06/20 1215  Vitals shown include unvalidated device data.  Last Pain:  Vitals:   06/06/20 1215  TempSrc:   PainSc: Asleep      Patients Stated Pain Goal: 0 (97/98/92 1194)  Complications: No complications documented.

## 2020-06-06 NOTE — Interval H&P Note (Signed)
History and Physical Interval Note:  06/06/2020 11:15 AM  Gail Montgomery  has presented today for surgery, with the diagnosis of STROKE.  The various methods of treatment have been discussed with the patient and family. After consideration of risks, benefits and other options for treatment, the patient has consented to  Procedure(s): TRANSESOPHAGEAL ECHOCARDIOGRAM (TEE) (N/A) as a surgical intervention.  The patient's history has been reviewed, patient examined, no change in status, stable for surgery.  I have reviewed the patient's chart and labs.  Questions were answered to the patient's satisfaction.     Chrystie Nose

## 2020-06-06 NOTE — CV Procedure (Signed)
Gail Montgomery  TRANSESOPHAGEAL ECHOCARDIOGRAM (TEE) NOTE  INDICATIONS: cryptogenic stroke  PROCEDURE:   Informed consent was obtained prior to the procedure. The risks, benefits and alternatives for the procedure were discussed and the patient comprehended these risks.  Risks include, but are not limited to, cough, sore throat, vomiting, nausea, somnolence, esophageal and stomach trauma or perforation, bleeding, low blood pressure, aspiration, pneumonia, infection, trauma to the teeth and death.    After a procedural time-out, the patient was given propofol per anesthesia for sedation.  The patient's heart rate, blood pressure, and oxygen saturation are monitored continuously during the procedure.The oropharynx was anesthetized with 2 topical cetacaine sprays.  The transesophageal probe was inserted in the esophagus and stomach without difficulty and multiple views were obtained.  The patient was kept under observation until the patient left the procedure room.  I was present face-to-face 100% of this time. The patient left the procedure room in stable condition.   Agitated microbubble saline contrast was administered.  COMPLICATIONS:    There were no immediate complications.  Findings:  1. LEFT VENTRICLE: The left ventricular wall thickness is normal.  The left ventricular cavity is normal in size. Wall motion is normal.  LVEF is 60-65%.  2. RIGHT VENTRICLE:  The right ventricle is normal in structure and function without any thrombus or masses.    3. LEFT ATRIUM:  The left atrium is normal in size without any thrombus or masses.  There is not spontaneous echo contrast ("smoke") in the left atrium consistent with a low flow state.  4. LEFT ATRIAL APPENDAGE:  The left atrial appendage is free of any thrombus or masses. The appendage has single lobes. Pulse doppler indicates moderate flow in the appendage.  5. ATRIAL SEPTUM:  The atrial septum is aneurysmal and demonstrates spontaneous bidirectional  shunting by color doppler and saline microbubble consistent with moderate-sized PFO.  6. RIGHT ATRIUM:  The right atrium is normal in size and function without any thrombus or masses. A prominent chiari network is noted.  7. MITRAL VALVE:  The mitral valve is normal in structure and function with no regurgitation.  There were no vegetations or stenosis.  8. AORTIC VALVE:  The aortic valve is trileaflet, normal in structure and function with no regurgitation.  There were no vegetations or stenosis  9. TRICUSPID VALVE:  The tricuspid valve is normal in structure and function with trivial to mild regurgitation.  There were no vegetations or stenosis  10.  PULMONIC VALVE:  The pulmonic valve is normal in structure and function with trivial regurgitation.  There were no vegetations or stenosis.   11. AORTIC ARCH, ASCENDING AND DESCENDING AORTA:  There was no Myrtis Ser et. Al, 1992) atherosclerosis of the ascending aorta, aortic arch, or proximal descending aorta.  12. PULMONARY VEINS: Anomalous pulmonary venous return was not noted.  13. PERICARDIUM: The pericardium appeared normal and non-thickened.  There is no pericardial effusion.  IMPRESSION:   1. Moderate sized bidirectional PFO by color doppler/saline microbubble contrast. 2. 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 Normal LVEF  RECOMMENDATIONS:    1. Given young age and recent stroke with moderate sized PFO, would recommend evaluation for PFO closure with the structural heart service. Findings d/w patient's mother at the bedside.  Time Spent Directly with the Patient:  45 minutes   Chrystie Nose, MD, [facility], FACP  [place]  [facility]  Medical Director of the [facility]  Cardiovascular Risk Reduction Clinic Diplomate of the  American Board of Clinical Lipidology Attending Cardiologist  Direct Dial: [phone]  Fax: [phone]  Website:  [email or web address] Nicely Tyiana Hill 06/06/2020, 12:30  PM

## 2020-06-07 ENCOUNTER — Encounter (HOSPITAL_COMMUNITY): Payer: Self-pay | Admitting: Internal Medicine

## 2020-06-07 ENCOUNTER — Inpatient Hospital Stay (HOSPITAL_COMMUNITY)
Admission: RE | Admit: 2020-06-07 | Discharge: 2020-06-20 | DRG: 056 | Disposition: A | Discharge status: Home / Self Care | Payer: BC Managed Care – PPO | Source: Intra-hospital | Attending: Physical Medicine & Rehabilitation | Admitting: Physical Medicine & Rehabilitation

## 2020-06-07 ENCOUNTER — Encounter (HOSPITAL_COMMUNITY): Payer: Self-pay | Admitting: Physical Medicine & Rehabilitation

## 2020-06-07 DIAGNOSIS — R000 Tachycardia, unspecified: Secondary | ICD-10-CM | POA: Diagnosis present

## 2020-06-07 DIAGNOSIS — Z7901 Long term (current) use of anticoagulants: Secondary | ICD-10-CM | POA: Diagnosis not present

## 2020-06-07 DIAGNOSIS — I10 Essential (primary) hypertension: Secondary | ICD-10-CM | POA: Diagnosis present

## 2020-06-07 DIAGNOSIS — I69091 Dysphagia following nontraumatic subarachnoid hemorrhage: Secondary | ICD-10-CM

## 2020-06-07 DIAGNOSIS — I63512 Cerebral infarction due to unspecified occlusion or stenosis of left middle cerebral artery: Secondary | ICD-10-CM

## 2020-06-07 DIAGNOSIS — I69092 Facial weakness following nontraumatic subarachnoid hemorrhage: Secondary | ICD-10-CM

## 2020-06-07 DIAGNOSIS — D62 Acute posthemorrhagic anemia: Secondary | ICD-10-CM | POA: Diagnosis present

## 2020-06-07 DIAGNOSIS — I82409 Acute embolism and thrombosis of unspecified deep veins of unspecified lower extremity: Secondary | ICD-10-CM

## 2020-06-07 DIAGNOSIS — Z8616 Personal history of COVID-19: Secondary | ICD-10-CM

## 2020-06-07 DIAGNOSIS — E871 Hypo-osmolality and hyponatremia: Secondary | ICD-10-CM

## 2020-06-07 DIAGNOSIS — G910 Communicating hydrocephalus: Secondary | ICD-10-CM | POA: Diagnosis present

## 2020-06-07 DIAGNOSIS — Z79899 Other long term (current) drug therapy: Secondary | ICD-10-CM | POA: Diagnosis not present

## 2020-06-07 DIAGNOSIS — R7989 Other specified abnormal findings of blood chemistry: Secondary | ICD-10-CM | POA: Diagnosis present

## 2020-06-07 DIAGNOSIS — Q211 Atrial septal defect: Secondary | ICD-10-CM

## 2020-06-07 DIAGNOSIS — E222 Syndrome of inappropriate secretion of antidiuretic hormone: Secondary | ICD-10-CM | POA: Diagnosis present

## 2020-06-07 DIAGNOSIS — I2699 Other pulmonary embolism without acute cor pulmonale: Secondary | ICD-10-CM | POA: Diagnosis present

## 2020-06-07 DIAGNOSIS — I69051 Hemiplegia and hemiparesis following nontraumatic subarachnoid hemorrhage affecting right dominant side: Secondary | ICD-10-CM | POA: Diagnosis present

## 2020-06-07 DIAGNOSIS — Z8249 Family history of ischemic heart disease and other diseases of the circulatory system: Secondary | ICD-10-CM

## 2020-06-07 DIAGNOSIS — E0781 Sick-euthyroid syndrome: Secondary | ICD-10-CM | POA: Diagnosis present

## 2020-06-07 DIAGNOSIS — F17210 Nicotine dependence, cigarettes, uncomplicated: Secondary | ICD-10-CM | POA: Diagnosis present

## 2020-06-07 DIAGNOSIS — I82461 Acute embolism and thrombosis of right calf muscular vein: Secondary | ICD-10-CM | POA: Diagnosis present

## 2020-06-07 DIAGNOSIS — I82431 Acute embolism and thrombosis of right popliteal vein: Secondary | ICD-10-CM | POA: Diagnosis present

## 2020-06-07 DIAGNOSIS — F419 Anxiety disorder, unspecified: Secondary | ICD-10-CM | POA: Diagnosis present

## 2020-06-07 DIAGNOSIS — R32 Unspecified urinary incontinence: Secondary | ICD-10-CM | POA: Diagnosis present

## 2020-06-07 DIAGNOSIS — I69020 Aphasia following nontraumatic subarachnoid hemorrhage: Secondary | ICD-10-CM | POA: Diagnosis not present

## 2020-06-07 DIAGNOSIS — I739 Peripheral vascular disease, unspecified: Secondary | ICD-10-CM | POA: Diagnosis present

## 2020-06-07 DIAGNOSIS — E876 Hypokalemia: Secondary | ICD-10-CM | POA: Diagnosis not present

## 2020-06-07 DIAGNOSIS — I82411 Acute embolism and thrombosis of right femoral vein: Secondary | ICD-10-CM | POA: Diagnosis present

## 2020-06-07 DIAGNOSIS — R509 Fever, unspecified: Secondary | ICD-10-CM | POA: Diagnosis present

## 2020-06-07 DIAGNOSIS — R1310 Dysphagia, unspecified: Secondary | ICD-10-CM | POA: Diagnosis present

## 2020-06-07 DIAGNOSIS — I69090 Apraxia following nontraumatic subarachnoid hemorrhage: Secondary | ICD-10-CM

## 2020-06-07 LAB — URINALYSIS, ROUTINE W REFLEX MICROSCOPIC
Bilirubin Urine: NEGATIVE
Glucose, UA: NEGATIVE mg/dL
Hgb urine dipstick: NEGATIVE
Ketones, ur: NEGATIVE mg/dL
Nitrite: NEGATIVE
Protein, ur: NEGATIVE mg/dL
Specific Gravity, Urine: 1.016 (ref 1.005–1.030)
pH: 5 (ref 5.0–8.0)

## 2020-06-07 LAB — BASIC METABOLIC PANEL
Anion gap: 12 (ref 5–15)
BUN: <5 mg/dL — ABNORMAL LOW (ref 6–20)
CO2: 20 mmol/L — ABNORMAL LOW (ref 22–32)
Calcium: 8.6 mg/dL — ABNORMAL LOW (ref 8.9–10.3)
Chloride: 97 mmol/L — ABNORMAL LOW (ref 98–111)
Creatinine, Ser: 0.64 mg/dL (ref 0.44–1.00)
GFR calc Af Amer: >60 mL/min (ref >60)
GFR calc non Af Amer: >60 mL/min (ref >60)
Glucose, Bld: 119 mg/dL — ABNORMAL HIGH (ref 70–99)
Potassium: 3.3 mmol/L — ABNORMAL LOW (ref 3.5–5.1)
Sodium: 129 mmol/L — ABNORMAL LOW (ref 135–145)

## 2020-06-07 LAB — PROTIME-INR
INR: 1.3 — ABNORMAL HIGH (ref 0.8–1.2)
Prothrombin Time: 16 seconds — ABNORMAL HIGH (ref 11.4–15.2)

## 2020-06-07 MED ORDER — PANTOPRAZOLE SODIUM 40 MG PO TBEC: 40.0000 mg | DELAYED_RELEASE_TABLET | Freq: Every day | ORAL | Status: DC

## 2020-06-07 MED ORDER — DOCUSATE SODIUM 50 MG/5ML PO LIQD: 100.0000 mg | Freq: Every day | ORAL | 0 refills | Status: DC | PRN

## 2020-06-07 MED ORDER — PANTOPRAZOLE SODIUM 40 MG PO TBEC
40.0000 mg | DELAYED_RELEASE_TABLET | Freq: Every day | ORAL | Status: DC
  Administered 2020-06-08 – 2020-06-20 (×13): 40 mg via ORAL
  Filled 2020-06-07 (×13): qty 1

## 2020-06-07 MED ORDER — FOLIC ACID 1 MG PO TABS: 1.0000 mg | ORAL_TABLET | Freq: Every day | ORAL | Status: DC

## 2020-06-07 MED ORDER — CYANOCOBALAMIN 500 MCG PO TABS: 500.0000 ug | ORAL_TABLET | Freq: Every day | ORAL | Status: DC

## 2020-06-07 MED ORDER — CHLORHEXIDINE GLUCONATE CLOTH 2 % EX PADS
6.0000 | MEDICATED_PAD | Freq: Every day | CUTANEOUS | Status: DC
  Administered 2020-06-08: 6 via TOPICAL

## 2020-06-07 MED ORDER — DIPHENHYDRAMINE HCL 12.5 MG/5ML PO ELIX: 12.5000 mg | ORAL_SOLUTION | Freq: Four times a day (QID) | ORAL | Status: DC | PRN

## 2020-06-07 MED ORDER — ATORVASTATIN CALCIUM 10 MG PO TABS
20.0000 mg | ORAL_TABLET | Freq: Every day | ORAL | Status: DC
  Administered 2020-06-08 – 2020-06-20 (×13): 20 mg via ORAL
  Filled 2020-06-07 (×13): qty 2

## 2020-06-07 MED ORDER — FOLIC ACID 1 MG PO TABS
1.0000 mg | ORAL_TABLET | Freq: Every day | ORAL | Status: DC
  Administered 2020-06-08 – 2020-06-19 (×12): 1 mg via ORAL
  Filled 2020-06-07 (×12): qty 1

## 2020-06-07 MED ORDER — PROCHLORPERAZINE MALEATE 5 MG PO TABS
5.0000 mg | ORAL_TABLET | Freq: Four times a day (QID) | ORAL | Status: DC | PRN
  Filled 2020-06-07 (×2): qty 2

## 2020-06-07 MED ORDER — POLYETHYLENE GLYCOL 3350 17 G PO PACK: 17.0000 g | PACK | Freq: Every day | ORAL | 0 refills | Status: DC | PRN

## 2020-06-07 MED ORDER — TRAZODONE HCL 50 MG PO TABS
25.0000 mg | ORAL_TABLET | Freq: Every evening | ORAL | Status: DC | PRN
  Filled 2020-06-07 (×2): qty 1

## 2020-06-07 MED ORDER — PROSIGHT PO TABS
1.0000 | ORAL_TABLET | Freq: Every day | ORAL | Status: DC
  Administered 2020-06-08 – 2020-06-20 (×13): 1 via ORAL
  Filled 2020-06-07 (×13): qty 1

## 2020-06-07 MED ORDER — PROCHLORPERAZINE EDISYLATE 10 MG/2ML IJ SOLN: 5.0000 mg | Freq: Four times a day (QID) | INTRAMUSCULAR | Status: DC | PRN

## 2020-06-07 MED ORDER — BISACODYL 10 MG RE SUPP: 10.0000 mg | Freq: Every day | RECTAL | Status: DC | PRN

## 2020-06-07 MED ORDER — NIMODIPINE 6 MG/ML PO SOLN
60.0000 mg | ORAL | Status: DC
  Administered 2020-06-07 – 2020-06-11 (×23): 60 mg via ORAL
  Filled 2020-06-07 (×25): qty 10

## 2020-06-07 MED ORDER — THIAMINE HCL 100 MG PO TABS: 100.0000 mg | ORAL_TABLET | Freq: Every day | ORAL | Status: DC

## 2020-06-07 MED ORDER — ATORVASTATIN CALCIUM 20 MG PO TABS: 20.0000 mg | ORAL_TABLET | Freq: Every day | ORAL | Status: DC

## 2020-06-07 MED ORDER — APIXABAN 5 MG PO TABS
5.0000 mg | ORAL_TABLET | Freq: Two times a day (BID) | ORAL | Status: DC
  Administered 2020-06-07 – 2020-06-20 (×26): 5 mg via ORAL
  Filled 2020-06-07 (×27): qty 1

## 2020-06-07 MED ORDER — ALUM & MAG HYDROXIDE-SIMETH 200-200-20 MG/5ML PO SUSP: 30.0000 mL | ORAL | Status: DC | PRN

## 2020-06-07 MED ORDER — VITAMIN B-12 1000 MCG PO TABS
500.0000 ug | ORAL_TABLET | Freq: Every day | ORAL | Status: DC
  Administered 2020-06-08 – 2020-06-19 (×12): 500 ug via ORAL
  Filled 2020-06-07 (×12): qty 1

## 2020-06-07 MED ORDER — GUAIFENESIN-DM 100-10 MG/5ML PO SYRP: 5.0000 mL | ORAL_SOLUTION | Freq: Four times a day (QID) | ORAL | Status: DC | PRN

## 2020-06-07 MED ORDER — POLYETHYLENE GLYCOL 3350 17 G PO PACK: 17.0000 g | PACK | Freq: Every day | ORAL | Status: DC | PRN

## 2020-06-07 MED ORDER — ACETAMINOPHEN 325 MG PO TABS
325.0000 mg | ORAL_TABLET | ORAL | Status: DC | PRN
  Administered 2020-06-08: 650 mg via ORAL
  Filled 2020-06-07 (×3): qty 2

## 2020-06-07 MED ORDER — FLEET ENEMA 7-19 GM/118ML RE ENEM: 1.0000 | ENEMA | Freq: Once | RECTAL | Status: DC | PRN

## 2020-06-07 MED ORDER — ORAL CARE MOUTH RINSE
15.0000 mL | Freq: Two times a day (BID) | OROMUCOSAL | Status: DC
  Administered 2020-06-07 – 2020-06-20 (×17): 15 mL via OROMUCOSAL

## 2020-06-07 MED ORDER — PROCHLORPERAZINE 25 MG RE SUPP: 12.5000 mg | Freq: Four times a day (QID) | RECTAL | Status: DC | PRN

## 2020-06-07 MED ORDER — THIAMINE HCL 100 MG PO TABS
100.0000 mg | ORAL_TABLET | Freq: Every day | ORAL | Status: DC
  Administered 2020-06-08 – 2020-06-19 (×12): 100 mg via ORAL
  Filled 2020-06-07 (×12): qty 1

## 2020-06-07 MED ORDER — APIXABAN 5 MG PO TABS
5.0000 mg | ORAL_TABLET | Freq: Two times a day (BID) | ORAL | Status: DC
Start: 2020-06-07 — End: 2020-06-20

## 2020-06-07 MED ORDER — PROSIGHT PO TABS: 1.0000 | ORAL_TABLET | Freq: Every day | ORAL | 0 refills | Status: DC

## 2020-06-07 NOTE — Plan of Care (Signed)
  Problem: Education: Goal: Knowledge of secondary prevention will improve Outcome: Progressing Goal: Knowledge of patient specific risk factors addressed and post discharge goals established will improve Outcome: Progressing Goal: Individualized Educational Video(s) Outcome: Progressing   Problem: Coping: Goal: Will identify appropriate support needs Outcome: Progressing   Problem: Self-Care: Goal: Verbalization of feelings and concerns over difficulty with self-care will improve Outcome: Progressing   Problem: Intracerebral Hemorrhage Tissue Perfusion: Goal: Complications of Intracerebral Hemorrhage will be minimized Outcome: Progressing   Problem: Ischemic Stroke/TIA Tissue Perfusion: Goal: Complications of ischemic stroke/TIA will be minimized Outcome: Progressing   Problem: Spontaneous Subarachnoid Hemorrhage Tissue Perfusion: Goal: Complications of Spontaneous Subarachnoid Hemorrhage will be minimized Outcome: Progressing   Problem: Education: Goal: Knowledge of General Education information will improve Description: Including pain rating scale, medication(s)/side effects and non-pharmacologic comfort measures Outcome: Progressing   Problem: Health Behavior/Discharge Planning: Goal: Ability to manage health-related needs will improve Outcome: Progressing   Problem: Clinical Measurements: Goal: Ability to maintain clinical measurements within normal limits will improve Outcome: Progressing Goal: Will remain free from infection Outcome: Progressing Goal: Diagnostic test results will improve Outcome: Progressing Goal: Respiratory complications will improve Outcome: Progressing Goal: Cardiovascular complication will be avoided Outcome: Progressing   Problem: Activity: Goal: Risk for activity intolerance will decrease Outcome: Progressing   Problem: Nutrition: Goal: Adequate nutrition will be maintained Outcome: Progressing   Problem: Coping: Goal: Level of  anxiety will decrease Outcome: Progressing   Problem: Elimination: Goal: Will not experience complications related to bowel motility Outcome: Progressing Goal: Will not experience complications related to urinary retention Outcome: Progressing   Problem: Pain Managment: Goal: General experience of comfort will improve Outcome: Progressing   Problem: Safety: Goal: Ability to remain free from injury will improve Outcome: Progressing   Problem: Skin Integrity: Goal: Risk for impaired skin integrity will decrease Outcome: Progressing

## 2020-06-07 NOTE — H&P (Signed)
Physical Medicine and Rehabilitation Admission H&P    CC: Functional deficits due to stroke.    HPI: Gail Montgomery is a 24 year old RH-female with history of anxiety, recreational marijuana use, on OCP who was admitted on 05/19/20 with sudden onset of right sided weakness and difficulty speaking.  Fully vaccinated a month ago but noted to be covid + at admission. CT head showed dense L-MCA and CTA head/neck showed emergent LVO at left ICA terminus continuing into L-MCA. She underwent cerebral angio with thrombectomy of L-MCA with flow . Post procedure CT head showed large SAH with ventricular extension and received TXA and cryoprecipitate to stop the bleeding. MRI brain showed early subacute infarct in left basal ganglia and SAH along interhemispheric fissure into lateral ventricles with early communicating hydrocephalus. No surgical intervention needed per Dr. Franky Macho and she was started on hypertonic saline to manage edema. On nimotop due to concerns of vasospasms. She self extubated on 08/22 and respiratory status stable/ no Covid symptoms.   Repeat CT head showed progression of hydrocephalus treated conservatively with nimodipine to keep SBP<160.  BLE dopplers negative for DVT. 2D echo showed EF 60-65% with increase in right atrial pressure and TCD showed evidence of PFO. Dr. Roda Shutters felt stroke embolic question ICA dissection v/s paradoxical emboli from PFO v/s Covid hyper oagulopathy.  She developed feverssT-max 103 on 08/25 and started on broad spectrum antibiotics 08/25-08/27 but continued to have fevers with HR occasionally up to 150's.  Work up negative and antibiotics d/c on 08/29 as symptoms felt to be central v/s Covid related.  Repeat dopplers 08/30 showed acute DVT right femoral, PV and GV. CTA chest done showing bilateral segmental Left>Right PE with mild cardiomegaly. She was started on IV heparin and transitioned to Eliquis on 09/03.    Due to ongoing lethargy, EEG done showing  generalized slowing lateralized to left hemisphere--no seizures. Ceftriaxone added for E coli UTI but continued to have fevers repeat BC 09/05--negative X 4 days. Has completed 3 day course of ceftriaxone for UTI--?ESBL. 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 Dr. Rennis Golden recommends closure in the future. Patient with resultant dysphagia--advanced to regulars, aphasia, right sided weakness and apraxia affecting ADL tasks as well as mobility. CIR recommended due to functional decline.    Per pt's mother, not eating very well in last 24 hours- LBM this AM- been using purewick at night, but doesn't like it and BSC/bedpan otherwise.  Had some stomach pain- better- denies pain.  Mother is spending nights- if possible. Of note, pt doesn't like to talk- which is new. We made her speak by asking questions that mother didn't answer.   Review of Systems  Constitutional: Positive for fever. Negative for chills.  HENT: Negative for hearing loss.   Eyes: Negative for blurred vision and double vision.  Respiratory: Negative for shortness of breath and stridor.   Cardiovascular: Positive for leg swelling. Negative for chest pain.  Gastrointestinal: Positive for diarrhea (loose stools yesterday). Negative for constipation and heartburn.  Genitourinary: Negative for dysuria.  Musculoskeletal: Negative for joint pain and myalgias.  Skin: Negative for rash.  Neurological: Positive for speech change and focal weakness. Negative for dizziness.  Psychiatric/Behavioral: The patient does not have insomnia.   All other systems reviewed and are negative.    Past Medical History:  Diagnosis Date  . Anxiety   . Panic attack     Past Surgical  History:  Procedure Laterality Date  . BUBBLE STUDY  06/06/2020   Procedure: BUBBLE STUDY;  Surgeon: Chrystie Nose, MD;  Location: [facility] ENDOSCOPY;  Service: Cardiovascular;;  . IR CT  HEAD LTD  05/19/2020  . IR CT HEAD LTD  05/19/2020  . IR PERCUTANEOUS ART THROMBECTOMY/INFUSION INTRACRANIAL INC DIAG ANGIO  05/19/2020  . IR US GUIDE VASC ACCESS RIGHT  05/19/2020  . RADIOLOGY WITH ANESTHESIA N/A 05/19/2020   Procedure: IR WITH ANESTHESIA;  Surgeon: Radiologist, Medication, MD;  Location: [facility] OR;  Service: Radiology;  Laterality: N/A;  . TEE WITHOUT CARDIOVERSION N/A 06/06/2020   Procedure: TRANSESOPHAGEAL ECHOCARDIOGRAM (TEE);  Surgeon: Chrystie Nose, MD;  Location: [facility] ENDOSCOPY;  Service: Cardiovascular;  Laterality: N/A;    Family History  Problem Relation Age of Onset  . Healthy Mother   . Healthy Father   . Cancer Maternal Grandmother        renal  . 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 She was living off campus--plans to d/c with parents who live in [place]. She reports that she has been smoking cigarettes. She has never used smokeless tobacco. She reports current alcohol use. She reports that she does not use drugs.    Allergies  Allergen Reactions  . 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   Medications Prior to Admission  Medication Sig Dispense Refill  . apixaban (ELIQUIS) 5 MG TABS tablet Take 1 tablet (5 mg total) by mouth 2 (two) times daily. 60 tablet   . atorvastatin (LIPITOR) 20 MG tablet Take 1 tablet (20 mg total) by mouth daily.    . Calcium-Magnesium-Zinc (CAL-MAG-ZINC PO) Take 1 tablet by mouth daily with breakfast.    . docusate (COLACE) 50 MG/5ML liquid Place 10 mLs (100 mg total) into feeding tube daily as needed for mild constipation. 100 mL 0  . folic acid (FOLVITE) 1 MG tablet Take 1 tablet (1 mg total) by mouth daily.    . multivitamin (PROSIGHT) TABS tablet Take 1 tablet by mouth daily. 30 tablet 0  . pantoprazole (PROTONIX) 40 MG tablet  Take 1 tablet (40 mg total) by mouth daily.    . polyethylene glycol (MIRALAX / GLYCOLAX) 17 g packet Place 17 g into feeding tube daily as needed for moderate constipation. 14 each 0  . thiamine 100 MG tablet Take 1 tablet (100 mg total) by mouth daily.    . vitamin B-12 (CYANOCOBALAMIN) 500 MCG tablet Take 1 tablet (500 mcg total) by mouth daily.      Drug Regimen Review  Drug regimen was reviewed and remains appropriate with no significant issues identified  Home:     Functional History:    Functional Status:  Mobility:          ADL:    Cognition:      Physical Exam: Blood pressure 131/83, pulse (!) 102, temperature 98.7 F (37.1 C), temperature source Oral, resp. rate 20, height  (1.626 m), weight 77.9 kg, SpO2 97 %. Physical Exam Vitals and nursing note reviewed. Exam conducted with a chaperone present.  Constitutional:      General: She is not in acute distress.    Appearance: She is not toxic-appearing.     Comments: Pt initially asleep, but woke easily- mother  and PA at bedside, appropriate, wouldn't talk at all- just laughed occ and nodded head yes/no until the end- finally spoke- was pretty fluent in speech, but short sentences, NAD  HENT:     Head: Normocephalic and atraumatic.     Comments: Hair in twists/braids- R mild facial droop- tongue midline    Right Ear: External ear normal.     Left Ear: External ear normal.     Nose: Nose normal. No congestion.     Mouth/Throat:     Mouth: Mucous membranes are dry.     Pharynx: Oropharynx is clear. No oropharyngeal exudate.  Eyes:     Extraocular Movements: Extraocular movements intact.     Comments: No nystagmus B/L  Cardiovascular:     Rate and Rhythm: Normal rate and regular rhythm.     Heart sounds: Normal heart sounds.  Pulmonary:     Comments: CTA B/L- no W/R/R- good air movement  Abdominal:     Comments: Soft, NT, ND, (+)BS - hypoactive  Musculoskeletal:     Cervical back: Normal range of  motion. No rigidity.     Comments: RLE edema--keeps it externally rotated.  1-2+ nonpitting RLE edema- mainly to knee RUE- proximally 3+/5- in biceps, triceps and 4-/5 in WE/grip and finger abd LUE 5/5 in same muscles RLE- HF 2-/5, KE/KF 1/5, DF 1/5, PF 0/5 LLE- 5-/5 in same muscles   Skin:    Comments: IV L forearm - looks good Heels no bogginess or wounds Bottom looks great- no skin breakdown seen by PA or myself Little tiny bumpy area on inner R foot- near arch- <dime sized-   Neurological:     Mental Status: She is alert.     Comments: Non verbal--mother answered all questions.  Started talking and said a few sentences- short concise sentences at end- but otherwise nodding head yes or no or mother talking for her.  Intact to light touch in all 4 extremities and face Of note, bed soaked due to incontinence x1  Psychiatric:     Comments: Appropriate, laughed a few times out loud     Results for orders placed or performed during the hospital encounter of 05/19/20 (from the past 48 hour(s))  Protime-INR     Status: Abnormal   Collection Time: 06/06/20  1:35 AM  Result Value Ref Range   Prothrombin Time 15.6 (H) 11.4 - 15.2 seconds   INR 1.3 (H) 0.8 - 1.2    Comment: (NOTE) INR goal varies based on device and disease states. Performed at [facility], [address] [place], [place] [address]   Basic metabolic panel     Status: Abnormal   Collection Time: 06/06/20  1:35 AM  Result Value Ref Range   Sodium 129 (L) 135 - 145 mmol/L   Potassium 4.2 3.5 - 5.1 mmol/L   Chloride 97 (L) 98 - 111 mmol/L   CO2 24 22 - 32 mmol/L   Glucose, Bld 103 (H) 70 - 99 mg/dL    Comment: Glucose reference range applies only to samples taken after fasting for at least 8 hours.   BUN <5 (L) 6 - 20 mg/dL   Creatinine, Ser 3.84 0.44 - 1.00 mg/dL   Calcium 8.6 (L) 8.9 - 10.3 mg/dL   GFR calc non Af Amer >60 >60 mL/min   GFR calc Af Amer >60 >60 mL/min   Anion gap 8 5 - 15    Comment:  Performed at [facility], [address] [place], [place] [address]  CBC     Status: Abnormal   Collection Time: 06/06/20  1:35 AM  Result Value Ref Range   WBC 6.2 4.0 - 10.5 K/uL   RBC 3.27 (L) 3.87 - 5.11 MIL/uL   Hemoglobin 9.3 (L) 12.0 - 15.0 g/dL   HCT 34.129.1 (L) 36 - 46 %   MCV 89.0 80.0 - 100.0 fL   MCH 28.4 26.0 - 34.0 pg   MCHC 32.0 30.0 - 36.0 g/dL   RDW 93.712.3 90.211.5 - 40.915.5 %   Platelets 503 (H) 150 - 400 K/uL   nRBC 0.0 0.0 - 0.2 %    Comment: Performed at [facility], [address] [place], [place] [address]  Protime-INR     Status: Abnormal   Collection Time: 06/07/20  1:11 AM  Result Value Ref Range   Prothrombin Time 16.0 (H) 11.4 - 15.2 seconds   INR 1.3 (H) 0.8 - 1.2    Comment: (NOTE) INR goal varies based on device and disease states. Performed at [facility], [address] [place], [place] [address]   Basic metabolic panel     Status: Abnormal   Collection Time: 06/07/20  9:29 AM  Result Value Ref Range   Sodium 129 (L) 135 - 145 mmol/L   Potassium 3.3 (L) 3.5 - 5.1 mmol/L   Chloride 97 (L) 98 - 111 mmol/L   CO2 20 (L) 22 - 32 mmol/L   Glucose, Bld 119 (H) 70 - 99 mg/dL    Comment: Glucose reference range applies only to samples taken after fasting for at least 8 hours.   BUN <5 (L) 6 - 20 mg/dL   Creatinine, Ser 6.830.64 0.44 - 1.00 mg/dL   Calcium 8.6 (L) 8.9 - 10.3 mg/dL   GFR calc non Af Amer >60 >60 mL/min   GFR calc Af Amer >60 >60 mL/min   Anion gap 12 5 - 15    Comment: Performed at [facility], [address] [place], [place] [address]   ECHO TEE  Result Date: 06/06/2020    TRANSESOPHOGEAL ECHO REPORT   Patient Name:   Gail JanKEREN Montgomery Date of Exam: 06/06/2020 Medical Rec #:  [id number]    Height:       64.0 in Accession #:    [id number]   Weight:       187.6 lb Date of Birth:  Jan 07, 1996    BSA:          1.904 m Patient Age:    24 years     BP:           119/69 mmHg Patient Gender: F            HR:           94 bpm. Exam  Location:  Inpatient Procedure: 3D Echo, Color Doppler, Cardiac Doppler and Transesophageal Echo Indications:     Stroke 434.91 / I63.9  History:         Patient has prior history of Echocardiogram examinations, most                  recent 05/29/2020. Risk Factors:Current Smoker. ICA occlusion,                  DVT and PE, History of COVID 04/2020.  Sonographer:     Leta Junglingiffany Cooper RDCS Referring Phys:  [id number] [place] INGOLD Diagnosing Phys: Zoila ShutterKenneth Hilty MD PROCEDURE: The transesophogeal probe  was passed without difficulty through the esophogus of the patient. Sedation performed by different physician. The patient was monitored while under deep sedation. Anesthestetic sedation was provided intravenously by Anesthesiology: 208.71mg  of Propofol. The patient developed no complications during the procedure. A direct current cardioversion was performed. IMPRESSIONS  1. Left ventricular ejection fraction, by estimation, is 60 to 65%. The left ventricle has normal function.  2. Right ventricular systolic function is normal. The right ventricular size is normal.  3. No left atrial/left atrial appendage thrombus was detected.  4. The mitral valve is grossly normal. Trivial mitral valve regurgitation.  5. The aortic valve is tricuspid. Aortic valve regurgitation is not visualized.  6. Evidence of atrial level shunting detected by color flow Doppler. Agitated saline contrast bubble study was positive with shunting observed within 3-6 cardiac cycles suggestive of interatrial shunt. There is a moderately sized patent foramen ovale with bidirectional shunting across atrial septum. Conclusion(s)/Recommendation(s): Findings are concerning for an interatrial shunt as detailed above. Moderate sized bidirectional PFO. Given young age and recent stroke, would recommend structural heart team evaluation for PFO closure. FINDINGS  Left Ventricle: Left ventricular ejection fraction, by estimation, is 60 to 65%. The left ventricle has normal  function. The left ventricular internal cavity size was normal in size. There is no left ventricular hypertrophy. Right Ventricle: The right ventricular size is normal. No increase in right ventricular wall thickness. Right ventricular systolic function is normal. Left Atrium: Left atrial size was normal in size. No left atrial/left atrial appendage thrombus was detected. Right Atrium: Right atrial size was normal in size. Pericardium: There is no evidence of pericardial effusion. Mitral Valve: The mitral valve is grossly normal. Trivial mitral valve regurgitation. Tricuspid Valve: The tricuspid valve is grossly normal. Tricuspid valve regurgitation is trivial. Aortic Valve: The aortic valve is tricuspid. Aortic valve regurgitation is not visualized. Pulmonic Valve: The pulmonic valve was normal in structure. Pulmonic valve regurgitation is not visualized. Aorta: The aortic root and ascending aorta are structurally normal, with no evidence of dilitation. IAS/Shunts: The interatrial septum is aneurysmal. Evidence of atrial level shunting detected by color flow Doppler. Agitated saline contrast was given intravenously to evaluate for intracardiac shunting. Agitated saline contrast bubble study was positive  with shunting observed within 3-6 cardiac cycles suggestive of interatrial shunt. A moderately sized patent foramen ovale is detected with bidirectional shunting across atrial septum. MD Electronically signed by Zoila Shutter MD Signature Date/Time: 06/06/2020/3:12:47 PM    Final        Medical Problem List and Plan: 1.  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  B-PE/RLE DVT: Antithrombotics: -anticoagulation:  Pharmaceutical: Other (comment)--Eliquis  -antiplatelet therapy: N/a 3. Pain Management: Tylenol prn.  4.  Mood: LCSW to follow for evaluation and support.   -antipsychotic agents: N/A 5. Neuropsych: This patient is not fully capable of making decisions on her own behalf. 6. Skin/Wound Care: Routine pressure relief measures.  7. Fluids/Electrolytes/Nutrition: Monitor I/O -check lytes in am. Encourage fluids besides water.  8. Fevers: Inflammatory markers improving. Continues to have fevers--Tmax-100.4 last night. Felt to be due to clot burden/central. Will recheck UA/UCS to check for clearance.  9. SIADH/Hyponatremia: Na has been low as 126 and continues to fluctuate-129 today. Add fluid restrictions. Will continue to monitor with daily checks.  10.  HTN: Monitor BP tid--SBP goal <160.  11. Resting tachycardia: HR continues to range in 120's. Multifactorial --continue to monitor with increase in activity.  12. Anemia/thrombocytosis: Due to illness. Is stable--will continue to monitor  13. Sick Euthyroid syndrome: Needs follow up thyroid panel in 4-6 weeks.  14. Occ urinary incontinence- common in stroke pts- will let staff know to help.    Rinaldo Cloud. S. Love, PA-C 06/07/2020   I have personally performed a face to face diagnostic evaluation of this patient and formulated the key components of the plan.  Additionally, I have personally reviewed laboratory data, imaging studies, as well as relevant notes and concur with the physician assistant's documentation above.       Genice Rouge, MD 06/07/2020

## 2020-06-07 NOTE — Discharge Summary (Signed)
Physician Discharge Summary  Gail Montgomery AVW:[id number] DOB: 08-26-1996 DOA: 05/19/2020  PCP: No primary care provider on file.  Admit date: 05/19/2020 Discharge date: 06/07/2020  Admitted From: HOME.  Disposition:  [facility]  Recommendations for Outpatient Follow-up:  1. Follow up with PCP in 1-2 weeks 2. Please obtain BMP/CBC in one week Please follow up with neurology as scheduled.  please follow up with cardiology for PFO Closure   Discharge Condition: STABLE.  CODE STATUS:full code.  Diet recommendation: Heart Healthy   Brief/Interim Summary: 24 year old female who was admitted to the hospital on 05/19/20 with sudden onset of right hemiparesis and aphasia. She presented as a code stroke with acute left MCA Infarct. She received TPA at 11:29 am, underwent thrombectomy  With reperfusion, but Left ACA had residual small distal anterior cerebral clot.  During the procedure there was an ICA vasospasm versus dissection initially thought she needs a stent however upon reimaging the vessel was open.  She was loaded with aspirin and Plavix.  She was also found to be Covid positive but was asymptomatic.  Her hospital course was complicated by [facility], parenchymal hemorrhage and interval ventricular hemorrhage.  She completed the course of treatment for E. coli UTI.  She was also found to have a DVT/PE and was started on IV heparin and eventually transitioned to Eliquis on 06/01/2020.  She underwent TEE  showing PFO.  She will  follow-up with cardiology as an outpatient for PFO closure. She is currently waiting for a bed at CIR.   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 Stroke work-up done and TEE showed PFO.  Given PE and DVTs possible that she has.  Paradoxical embolic  infarction with PFO. She will need to follow-up with cardiology regarding PFO closure at a later time.   Discharge to CIR today.    Right lower extremity DVT and bilateral acute PE On Eliquis.   Low-grade fevers Possibly related to blood clot burdens.   E. coli UTI Completed the course of IV antibiotics.    Sick euthyroid syndrome Follow-up with thyroid panel in 4 to 6 weeks.    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    COVID-19 positive Incidental finding, asymptomatic.      Discharge Instructions  Discharge Instructions    Ambulatory referral to Neurology   Complete by: As directed    Follow up with Dr. Pearlean Brownie at [facility] in 4-6 weeks. Too complicated for NP to follow. Thanks.     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   atorvastatin 20 MG tablet Commonly known as: LIPITOR Take 1 tablet (20 mg total) by mouth daily.   CAL-MAG-ZINC PO Take 1 tablet  by mouth daily with breakfast.   docusate 50 MG/5ML liquid Commonly known as: COLACE Place 10 mLs (100 mg total) into feeding tube daily as needed for mild constipation.   folic acid 1 MG tablet Commonly known as: FOLVITE Take 1 tablet (1 mg total) by mouth daily.   multivitamin Tabs tablet Take 1 tablet by mouth daily.   pantoprazole 40 MG tablet Commonly known as: PROTONIX Take 1 tablet (40 mg total) by mouth daily.   polyethylene glycol 17 g packet Commonly known as:  MIRALAX / GLYCOLAX Place 17 g into feeding tube daily as needed for moderate constipation.   thiamine 100 MG tablet Take 1 tablet (100 mg total) by mouth daily.   vitamin B-12 500 MCG tablet Commonly known as: CYANOCOBALAMIN Take 1 tablet (500 mcg total) by mouth daily.       Follow-up Information    Micki Riley, MD. Schedule an appointment as soon as possible for a visit in 4 week(s).   Specialties: Neurology, Radiology Contact information: [address] [place] [address] [phone]        Tonny Bollman, MD. Schedule an appointment as soon as possible for a visit in 1 week(s).   Specialty: Cardiology Contact information: [address] [place] [address] [phone]              Allergies  Allergen Reactions  . 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    Consultations:  Neurology  cardiology   Procedures/Studies: CT Code Stroke CTA Head W/WO contrast  Result Date: 05/19/2020 CLINICAL DATA:  Stroke suspected. EXAM: CT ANGIOGRAPHY HEAD AND NECK TECHNIQUE: Multidetector CT imaging of the head and neck was performed using the standard protocol during bolus administration of intravenous contrast. Multiplanar CT image reconstructions and MIPs were obtained to evaluate the vascular anatomy. Carotid stenosis measurements (when applicable) are obtained utilizing NASCET criteria, using the distal internal carotid diameter as the denominator. CONTRAST:  Dose is currently not known COMPARISON:  Head CT reported separately FINDINGS: CTA NECK FINDINGS Aortic arch: Negative.  Three vessel branching Right carotid system: No atheromatous changes, dissection, or beading. Left carotid system: Mild motion artifact which could account for the mildly smudged appearance of the left ICA proximally. Vertebral arteries: No proximal  subclavian stenosis. Codominant vertebral arteries that are smooth and widely patent to the dura. Skeleton: No acute or aggressive finding Other neck: Negative Upper chest: Negative Review of the MIP images confirms the above findings CTA HEAD FINDINGS Anterior circulation: Cut off at the left ICA terminus with visible luminal clot. There is continuation of non opacification throughout the left M1 segment which is clot by CT. Non opacification at the left A1 origin. No contralateral branch occlusion is seen. Intravascular enhancement seen in the left operculum branches on the delayed phase. Posterior circulation: The vertebral and basilar arteries are smooth and widely patent. No branch occlusion, beading, or aneurysm. Venous sinuses: Patent Anatomic variants: None significant Review of the MIP images confirms the above findings These results were called by telephone at the time of interpretation on 05/19/2020 at 11:35 am to provider MCNEILL [facility] , who is already aware. IMPRESSION: 1. 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 2. Mildly indistinct appearance of the proximal left ICA is likely from motion artifact, consider cervical run. No atheromatous changes or  vasculopathy seen in the neck. Electronically Signed   By: Marnee Spring M.D.   On: 05/19/2020 11:40   CT Head Wo Contrast  Result Date: 06/01/2020 CLINICAL DATA:  Left ICA and MCA occlusion 05/19/2020 with subsequent intervention and subarachnoid hemorrhage. EXAM: CT HEAD WITHOUT CONTRAST TECHNIQUE: Contiguous axial images were obtained from the base of the skull through the vertex without intravenous contrast. COMPARISON:  CT 05/29/2020. Multiple previous imaging studies as distant as 05/19/2020. FINDINGS: Brain: Brainstem and cerebellum remain normal. Focal hematoma in the anterior interhemispheric fissure continues to become less dense. Infarction in the left cingulate gyrus and left posterior putamen  and caudate nucleus as seen previously. Question some infarction also of the left temporal lobe. No hydrocephalus. No subdural collection. Vascular: No new vascular finding. Skull: Negative Sinuses/Orbits: Clear except for near total opacification of the left sphenoid sinus. Orbits negative. Other: None IMPRESSION: 1. Focal hematoma in the anterior interhemispheric fissure continues to become less dense. 2. Infarction in the left cingulate gyrus and left posterior putamen and caudate nucleus as seen previously. Question some infarction also of the left temporal lobe. Electronically Signed   By: Paulina Fusi M.D.   On: 06/01/2020 11:14   CT Head Wo Contrast  Result Date: 05/29/2020 CLINICAL DATA:  Intracranial hemorrhage follow-up. EXAM: CT HEAD WITHOUT CONTRAST TECHNIQUE: Contiguous axial images were obtained from the base of the skull through the vertex without intravenous contrast. COMPARISON:  CT head 05/24/2020 FINDINGS: Brain: Interval decrease in the size of hyperdense anterior interhemispheric hemorrhage, which is both intraparenchymal and extra-axial. Interval decrease in small volume intraventricular hemorrhage that is layering in the occipital horns bilaterally. No new areas of hemorrhage. Mildly decreased edema associated with a left parafalcine frontal ACA branch territory infarct. Similar edema associated with a left lateral lenticulostriate MCA territory infarct. No new areas of acute large vascular territory infarct. Slight decrease in the size of the temporal horns lateral ventricles, which are mildly rounded. No new mass effect. No substantial midline shift. MIld suprasellar cistern effacement. Vascular: No interval findings. Skull: Negative. Sinuses/Orbits: Left sphenoid opacification with frothy secretions. IMPRESSION: 1. Interval decrease in the size/conspicuity of intraparenchymal, extra-axial, and intraventricular hemorrhage, as above. No new acute hemorrhage. 2. Redemonstrated subacute  left ACA and left MCA territory infarcts. No progressive mass effect. 3. Slightly improved mild ventriculomegaly. Electronically Signed   By: Feliberto Harts MD   On: 05/29/2020 08:26   CT HEAD WO CONTRAST  Result Date: 05/24/2020 CLINICAL DATA:  Stroke follow-up.  COVID positive EXAM: CT HEAD WITHOUT CONTRAST TECHNIQUE: Contiguous axial images were obtained from the base of the skull through the vertex without intravenous contrast. COMPARISON:  Yesterday FINDINGS: Brain: Anterior interhemispheric hemorrhage (subarachnoid and also parenchymal) with adjacent left ACA branch infarct that is pericallosal. There is also a lateral lenticulostriate infarct on the left. Intraventricular hemorrhage with lateral ventriculomegaly that measures similar to yesterday. No evidence of new hemorrhage or new infarct. Vascular: No interval finding Skull: Negative Sinuses/Orbits: Left sphenoid sinus opacification. IMPRESSION: 1. 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 2. Unchanged intracranial hemorrhage with mild lateral ventriculomegaly. Electronically Signed   By: Marnee Spring M.D.   On: 05/24/2020 07:13   CT HEAD WO CONTRAST  Result Date: 05/22/2020 CLINICAL DATA:  24 year old female status post code stroke presentation on 05/19/2020 with left ICA terminus ELVO. Status post endovascular reperfusion, subarachnoid and intra-axial hemorrhage. EXAM: CT HEAD WITHOUT CONTRAST TECHNIQUE: Contiguous axial images were obtained from the base of the skull through  the vertex without intravenous contrast. COMPARISON:  Brain MRI 05/20/2020 and earlier. Head CT 05/21/2020 and earlier. FINDINGS: Brain: Decreased intraventricular hemorrhage since yesterday with small volume residual. There is mild ventriculomegaly, possible mild transependymal edema. Left inferior frontal gyrus parasagittal blood which appears to be both intra-and extra-axial encompasses 48 by 19 x 26 mm (AP by transverse by CC), and is stable since yesterday  (estimated total volume 12 mL). Associated edema in the medial left inferior frontal gyrus, gyrus rectus and cingulate is stable. Stable mild regional mass effect. No midline shift. Basilar cisterns remain patent. Trace subarachnoid hemorrhage elsewhere. Cytotoxic edema redemonstrated in the left basal ganglia and stable. Stable gray-white matter differentiation throughout the brain. Vascular: No suspicious intracranial vascular hyperdensity now. Skull: No acute osseous abnormality identified. Sinuses/Orbits: Stable fluid levels in the sphenoid sinuses. Other paranasal sinuses and mastoids are stable and well pneumatized. Other: No acute orbit or scalp soft tissue finding. IMPRESSION: 1. Regressed intraventricular hemorrhage since yesterday with small volume residual. Stable mild ventriculomegaly, and possible mild transependymal edema. 2. Stable left inferior frontal gyrus parasagittal hematoma (approximately 12 mL) and regional edema. No significant midline shift, basilar cisterns are patent. 3. Stable left basal ganglia infarcts. 4. No new intracranial abnormality. Electronically Signed   By: Odessa Fleming M.D.   On: 05/22/2020 08:40   CT HEAD WO CONTRAST  Result Date: 05/21/2020 CLINICAL DATA:  Stroke, follow-up. EXAM: CT HEAD WITHOUT CONTRAST TECHNIQUE: Contiguous axial images were obtained from the base of the skull through the vertex without intravenous contrast. COMPARISON:  CT head without contrast 05/19/2020 and 05/20/2020. MR head without contrast 05/20/2020. FINDINGS: Brain: Left basal ganglia nonhemorrhagic infarction is again noted. Predominantly extra-axial medial left frontal hemorrhage is again noted, extending into the ventricles bilaterally. No significant change in hemorrhage volume is present. Ventricles are further dilated. No new areas of hemorrhage are present. Additional subarachnoid hemorrhage is now seen posteriorly on the left and along the inter cerebral hemisphere. Hemorrhage is present  within the aqueduct of Sylvius. Vascular: No hyperdense vessel or unexpected calcification. Skull: Calvarium is intact. No focal lytic or blastic lesions are present. No significant extracranial soft tissue lesion is present. Sinuses/Orbits: Fluid is present in the sphenoid sinuses bilaterally and in the nasopharynx. The paranasal sinuses and mastoid air cells are otherwise clear. The globes and orbits are within normal limits. IMPRESSION: 1. Stable appearance of left basal ganglia nonhemorrhagic infarction. 2. Stable medial left frontal extra-axial hemorrhage, extending into the ventricles bilaterally. 3. Subarachnoid hemorrhage posteriorly on the left and along the inter cerebral hemisphere likely represents redistribution without significant new hemorrhage. 4. Hemorrhage is present within the aqueduct of Sylvius. 5. Progressive dilation of the ventricles compatible with developing hydrocephalus. 6. No new areas of hemorrhage. Electronically Signed   By: Marin Roberts M.D.   On: 05/21/2020 06:39   CT HEAD WO CONTRAST  Result Date: 05/20/2020 CLINICAL DATA:  Intracranial hemorrhage follow up EXAM: CT HEAD WITHOUT CONTRAST TECHNIQUE: Contiguous axial images were obtained from the base of the skull through the vertex without intravenous contrast. COMPARISON:  05/19/2020 FINDINGS: Brain: There is subarachnoid hemorrhage again along the anterior interhemispheric fissure with extension into both lateral ventricles. The temporal horn of the left lateral ventricle has slightly increased in size. The amount of blood in the ventricles is also slightly greater. There is no herniation. Vascular: No hyperdense vessel or unexpected calcification. Skull: Normal. Negative for fracture or focal lesion. Sinuses/Orbits: No acute finding. Other: None. IMPRESSION: 1. Subarachnoid hemorrhage along  the anterior interhemispheric fissure with slightly increased extension into both lateral ventricles. 2. Slight increase in size  of the temporal horn of the left lateral ventricle consistent with early communicating hydrocephalus. Electronically Signed   By: Deatra Robinson M.D.   On: 05/20/2020 01:31   CT HEAD WO CONTRAST  Result Date: 05/19/2020 CLINICAL DATA:  Patient status post intervention for left internal carotid and MCA occlusion. EXAM: CT HEAD WITHOUT CONTRAST TECHNIQUE: Contiguous axial images were obtained from the base of the skull through the vertex without intravenous contrast. COMPARISON:  Head CT earlier today. FINDINGS: Brain: The patient has a new large volume of subarachnoid and intraventricular hemorrhage centered anteriorly at the circle-of-Willis. Hemorrhage extends into the fourth ventricle. There is no midline shift or hydrocephalus. Gray-white differentiation appears preserved. No mass is identified. Imaged intracranial contents appear normal. Vascular: Large vessel structures are largely opacified with contrast. Skull: Intact. Sinuses/Orbits: Negative. Other: None. IMPRESSION: New large volume of subarachnoid and intraventricular hemorrhage. Critical Value/emergent results were called by telephone at the time of interpretation on 05/19/2020 at 3:38 pm to provider Dr. Amada Jupiter, who verbally acknowledged these results. Electronically Signed   By: Drusilla Kanner M.D.   On: 05/19/2020 15:42   CT Code Stroke CTA Neck W/WO contrast  Result Date: 05/19/2020 CLINICAL DATA:  Stroke suspected. EXAM: CT ANGIOGRAPHY HEAD AND NECK TECHNIQUE: Multidetector CT imaging of the head and neck was performed using the standard protocol during bolus administration of intravenous contrast. Multiplanar CT image reconstructions and MIPs were obtained to evaluate the vascular anatomy. Carotid stenosis measurements (when applicable) are obtained utilizing NASCET criteria, using the distal internal carotid diameter as the denominator. CONTRAST:  Dose is currently not known COMPARISON:  Head CT reported separately FINDINGS: CTA NECK  FINDINGS Aortic arch: Negative.  Three vessel branching Right carotid system: No atheromatous changes, dissection, or beading. Left carotid system: Mild motion artifact which could account for the mildly smudged appearance of the left ICA proximally. Vertebral arteries: No proximal subclavian stenosis. Codominant vertebral arteries that are smooth and widely patent to the dura. Skeleton: No acute or aggressive finding Other neck: Negative Upper chest: Negative Review of the MIP images confirms the above findings CTA HEAD FINDINGS Anterior circulation: Cut off at the left ICA terminus with visible luminal clot. There is continuation of non opacification throughout the left M1 segment which is clot by CT. Non opacification at the left A1 origin. No contralateral branch occlusion is seen. Intravascular enhancement seen in the left operculum branches on the delayed phase. Posterior circulation: The vertebral and basilar arteries are smooth and widely patent. No branch occlusion, beading, or aneurysm. Venous sinuses: Patent Anatomic variants: None significant Review of the MIP images confirms the above findings These results were called by telephone at the time of interpretation on 05/19/2020 at 11:35 am to provider MCNEILL [facility] , who is already aware. IMPRESSION: 1. 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 2. Mildly indistinct appearance of the proximal left ICA is likely from motion artifact, consider cervical run. No atheromatous changes or vasculopathy seen in the neck. Electronically Signed   By: Marnee Spring M.D.   On: 05/19/2020 11:40   CT ANGIO CHEST PE W OR WO CONTRAST  Result Date: 05/28/2020 CLINICAL DATA:  24 year old female with tachycardia, COVID and positive D-dimer. EXAM: CT ANGIOGRAPHY CHEST WITH CONTRAST TECHNIQUE: Multidetector CT imaging of the chest was performed using the standard protocol during bolus administration of intravenous contrast.  Multiplanar  CT image reconstructions and MIPs were obtained to evaluate the vascular anatomy. CONTRAST:  60mL OMNIPAQUE IOHEXOL 350 MG/ML SOLN COMPARISON:  None. FINDINGS: Cardiovascular: This is a technically adequate study, but motion artifact does limit evaluation in some areas. Multiple pulmonary emboli are identified and within the LEFT LOWER lobe pulmonary artery, and multiple bilateral segmental LOWER lobe pulmonary arteries. Equivocal emboli within LEFT UPPER lobe segmental pulmonary arteries noted. Mild cardiomegaly is present.  RV/LV ratio equals 0.84. There is no evidence of thoracic aortic aneurysm or pericardial effusion. Mediastinum/Nodes: No enlarged mediastinal, hilar, or axillary lymph nodes. Thyroid gland, trachea, and esophagus demonstrate no significant findings. Lungs/Pleura: No airspace disease, consolidation, mass or nodule. No pleural effusion or pneumothorax. Upper Abdomen: No acute abnormality. Musculoskeletal: No chest wall abnormality. No acute or significant osseous findings. Review of the MIP images confirms the above findings. IMPRESSION: 1. Bilateral segmental LOWER lobe pulmonary emboli, LEFT-greater-than-RIGHT. Equivocal segmental emboli within the LEFT UPPER lobe. 2. Mild cardiomegaly. Critical Value/emergent results were called by telephone at the time of interpretation on 05/28/2020 at 2:41 pm to provider Arlys JohnBrian, nurse on this floor,, who verbally acknowledged these results. Electronically Signed   By: Harmon PierJeffrey  Hu M.D.   On: 05/28/2020 14:45   MR BRAIN WO CONTRAST  Result Date: 05/20/2020 CLINICAL DATA:  Sudden onset aphasia and right-sided weakness EXAM: MRI HEAD WITHOUT CONTRAST TECHNIQUE: Multiplanar, multiecho pulse sequences of the brain and surrounding structures were obtained without intravenous contrast. COMPARISON:  Head CT 05/20/2020 FINDINGS: Brain: Early subacute infarct of the left basal ganglia involving the posterior putamen and caudate body. There is  subarachnoid hemorrhage along the interhemispheric fissure extending into the lateral ventricles. There is early communicating hydrocephalus as evidenced by slightly increased size of the temporal horns. There is no herniation or other mass effect Vascular: Normal flow voids Skull and upper cervical spine: Normal Sinuses/Orbits: Normal Other: Fluid in the nasopharynx. IMPRESSION: 1. Early subacute infarct of the left basal ganglia involving the posterior putamen and caudate body. 2. Subarachnoid hemorrhage along the interhemispheric fissure extending into the lateral ventricles. 3. 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 Electronically Signed   By: Deatra RobinsonKevin  Herman M.D.   On: 05/20/2020 02:23   CT ABDOMEN PELVIS W CONTRAST  Result Date: 06/02/2020 CLINICAL DATA:  Abdominal pain and fever. Undergoing anticoagulation for bilateral pulmonary emboli. EXAM: CT ABDOMEN AND PELVIS WITH CONTRAST TECHNIQUE: Multidetector CT imaging of the abdomen and pelvis was performed using the standard protocol following bolus administration of intravenous contrast. CONTRAST:  100mL OMNIPAQUE IOHEXOL 300 MG/ML  SOLN COMPARISON:  Chest CTA 05/28/2020 FINDINGS: Lower chest: Bilateral lower lobe pulmonary emboli are again noted, similar to recent chest CTA, most prominent in the left lower lobe. The lung bases are clear. No significant pleural or pericardial effusion. Hepatobiliary: The liver is normal in density without suspicious focal abnormality. No evidence of gallstones, gallbladder wall thickening or biliary dilatation. Pancreas: Unremarkable. No pancreatic ductal dilatation or surrounding inflammatory changes. Spleen: Normal in size without focal abnormality. Adrenals/Urinary Tract: Both adrenal glands appear normal. The kidneys appear normal without evidence of urinary tract calculus, suspicious lesion or hydronephrosis. No bladder abnormalities are seen. Stomach/Bowel: No  evidence of bowel wall thickening, distention or surrounding inflammatory change. The appendix appears normal. Vascular/Lymphatic: There are no enlarged abdominal or pelvic lymph nodes. Probable nonocclusive thrombus in the right femoral vein, best seen on coronal image 49/7. No evidence thrombus in the IVC or iliac veins. No significant arterial  abnormalities. The portal, superior mesenteric and splenic veins are patent. Reproductive: The uterus and ovaries appear normal. No adnexal mass. Other: No evidence of abdominal wall mass or hernia. No ascites. Musculoskeletal: No acute or significant osseous findings. Peripherally sclerotic 8 mm lesion in the left iliac bone (image 64/3) has a nonaggressive appearance and is likely incidental. IMPRESSION: 1. No acute findings or explanation for the patient's symptoms. 2. Probable nonocclusive thrombus in the right femoral vein. Grossly stable known bilateral lower lobe pulmonary emboli, similar to recent chest CTA. Electronically Signed   By: Carey Bullocks M.D.   On: 06/02/2020 14:28   IR [facility]  Result Date: 05/21/2020 INDICATION: 24 year old female with a history of acute left MCA syndrome, presents for mechanical thrombectomy COVID test was confirmed positive after initiation of the case EXAM: ULTRASOUND-GUIDED RIGHT COMMON FEMORAL ARTERY ACCESS CERVICAL AND CEREBRAL ANGIOGRAM MECHANICAL THROMBECTOMY LEFT MCA ANGIO-SEAL FOR HEMOSTASIS COMPARISON:  CT imaging same day MEDICATIONS: 200 mcg intra arterial nitroglycerin. ANESTHESIA/SEDATION: The anesthesia team was present to provide general endotracheal tube anesthesia and for patient monitoring during the procedure. Intubation was performed in negative pressure Bay in neuro IR holding. Left radial arterial line was performed by the anesthesia team. Interventional neuro radiology nursing staff was also present. CONTRAST:  150 cc FLUOROSCOPY TIME:  Fluoroscopy Time: 53 minutes 36 seconds (1651 mGy).  COMPLICATIONS: SIR LEVEL C - Requires therapy, minor hospitalization (<48 hrs). Subarachnoid hemorrhage TECHNIQUE: Informed written consent was obtained from the patient's family after a thorough discussion of the procedural risks, benefits and alternatives. Specific risks discussed include: Bleeding, infection, contrast reaction, kidney injury/failure, need for further procedure/surgery, arterial injury or dissection, embolization to new territory, intracranial hemorrhage (10-15% risk), neurologic deterioration, cardiopulmonary collapse, death. All questions were addressed. Maximal Sterile Barrier Technique was utilized including during the procedure including caps, mask, sterile gowns, sterile gloves, sterile drape, hand hygiene and skin antiseptic. A timeout was performed prior to the initiation of the procedure. The anesthesia team was present to provide general endotracheal tube anesthesia and for patient monitoring during the procedure. Interventional neuro radiology nursing staff was also present. FINDINGS: Initial Findings: Left common carotid artery: ICA patent with no tortuosity and no significant atherosclerosis. Separate branch from the aortic arch. Left external carotid artery: Patent with antegrade flow. Left internal carotid artery: The common carotid artery is relatively straight with no beading or significant narrowing. The AP image on the initial image demonstrates perhaps a slight irregularity on the lateral intimal surface which may correspond to the coronal reformatted images on the comparison CT preprocedure (image 25 of series 9). Given that there was no flow limiting problem, we elected to proceed with the thrombectomy Left MCA: The vertical horizontal petrous segment patent. Segment lacerum patent. Cavernous and supraclinoid patent. Occlusion of the proximal M1 segment with visible filling defect compatible with the findings on the CT. Left ACA: A1 is patent. There is a nearly 180 degree  angulation of the A1 origin. Patent anterior communicating artery. There has been migration of the thromboembolism through the terminal segment into the A2 segment compared to the prior CT. Procedural findings: Baseline MCA flow: TICI 0 Left MCA: After 1 pass, there is no significant restoration of flow. Upon placement of the stent tree bronch the second attempt, and angiogram demonstrated no significant flow through the solitaire. After the second pass, complete restoration of flow through the MCA territory was achieved, TICI 3. During this time, there was further migration of the A2 embolus to  the pericallosal artery, with the ultimate occlusion approximately level to the coronal suture. Given that the anatomy would not allow and navigation of the microcatheter and intermediate catheter into the segment, and given the perceived density of the ACA thromboembolism, we ultimately withdrew from further attempts thrombectomy when the wire and microcatheter would not pass through the face of the thrombus. Final angiogram demonstrated no evidence of extravasation of contrast at this level. The flat panel CT demonstrates inter hemispheric subarachnoid hemorrhage without mass effect. Angiogram of the cervical ICA upon withdrawal of the balloon catheter demonstrated long segment irregularity, with variations in the diameter extending from beyond the bifurcation to the horizontal petrous segment. The irregularity was not only spatial but variations in temporal appearance, as the narrowing worsen to greater than 80%, such as that demonstrated on image 5/7 angiogram 23. After 20 minutes of placing orogastric tube and giving dual antiplatelets dosage, anticipating stenting, a repeat angiogram demonstrated improvement in the appearance of the diameter, image 5/7 of angiogram 24. At this time we elected not to stent. PROCEDURE: The anesthesia team was present to provide general endotracheal tube anesthesia and for patient  monitoring during the procedure. Intubation was performed in negative pressure Bay in neuro IR holding. Interventional neuro radiology nursing staff was also present. Ultrasound survey of the right inguinal region was performed with images stored and sent to PACs. 11 blade scalpel was used to make a small incision. Blunt dissection was performed with US guidance. A micropuncture needle was used access the right common femoral artery under ultrasound. With excellent arterial blood flow returned, an .018 micro wire was passed through the needle, observed to enter the abdominal aorta under fluoroscopy. The needle was removed, and a micropuncture sheath was placed over the wire. The inner dilator and wire were removed, and an 035 wire was advanced under fluoroscopy into the abdominal aorta. The sheath was removed and a 25cm 75F straight vascular sheath was placed. The dilator was removed and the sheath was flushed. Sheath was attached to pressurized and heparinized saline bag for constant forward flow. A coaxial system was then advanced over the 035 wire. This included a 95cm 087 "Walrus" balloon guide with coaxial 125cm Berenstein diagnostic catheter. This was advanced to the proximal descending thoracic aorta. Wire was then removed. Double flush of the catheter was performed. Catheter was then used to select the left common carotid artery. Angiogram was performed. Using roadmap technique, the catheter was advanced over a standard glide wire into left cervical ICA, with luminal position achieved of the guide catheter. Wire was removed and angiogram was performed of the left carotid artery. Initial angiogram revealed no intimal/luminal irregularity and we proceeded with placement of the balloon guide into the distal cervical ICA. The balloon guide and the parents teen catheter were advanced with a Glidewire into the distal cervical segment of the ICA. Glidewire and the Berenstein catheter were gently removed and double  flush was performed. Formal angiogram was performed. Road map function was used once the occluded vessel was identified. Copious back flush was performed and the balloon catheter was attached to heparinized and pressurized saline bag for forward flow. A second coaxial system was then advanced through the balloon catheter, which included the selected intermediate catheter, microcatheter, and microwire. In this scenario, the set up included intermediate catheter, large-bore aspiration catheter, 135 cm, a Trevo Provue18 microcatheter, and 014 synchro soft wire. This system was advanced through the balloon guide catheter under the road-map function, with adequate back-flush at  the rotating hemostatic valve at that back end of the balloon guide. Microcatheter and the intermediate catheter system were advanced through the terminal ICA and MCA to the level of the occlusion. The micro wire was then carefully advanced through the occluded segment. Microcatheter was then manipulated through the occluded segment and the wire was removed with saline drip at the hub. Blood was then aspirated through the hub of the microcatheter, and a gentle contrast injection was performed confirming intraluminal position. A rotating hemostatic valve was then attached to the back end of the microcatheter, and a pressurized and heparinized saline bag was attached to the catheter. 4 x 40 solitaire device was then selected. Back flush was achieved at the rotating hemostatic valve, and then the device was gently advanced through the microcatheter to the distal end. The retriever was then unsheathed by withdrawing the microcatheter under fluoroscopy. Once the retriever was completely unsheathed, the microcatheter was carefully stripped from the delivery device. A 3 minute time interval was observed. The balloon at the balloon guide catheter was then inflated under fluoroscopy for proximal flow arrest. Constant aspiration using the proprietary engine  was then performed at the intermediate catheter, as the retriever was gently and slowly withdrawn with fluoroscopic observation. Once the retriever was "corked" within the tip of the intermediate catheter, both were removed from the system. Free aspiration was confirmed at the hub of the balloon guide catheter, with free blood return confirmed. The balloon was then deflated, and a control angiogram was performed. The M1 segment remained occluded after the first pass. We then attempted a second pass. Microcatheter and the intermediate catheter system were again advanced through the terminal ICA and MCA to the level of the occlusion. The micro wire was then carefully advanced through the occluded segment. Microcatheter was then manipulated through the occluded segment and the wire was removed with saline drip at the hub. Blood was then aspirated through the hub of the microcatheter, and a gentle contrast injection was performed confirming intraluminal position. A rotating hemostatic valve was then attached to the back end of the microcatheter, and a pressurized and heparinized saline bag was attached to the catheter. 4 x 40 solitaire device was again used. Back flush was achieved at the rotating hemostatic valve, and then the device was gently advanced through the microcatheter to the distal end. The retriever was then unsheathed by withdrawing the microcatheter under fluoroscopy. Once the retriever was completely unsheathed, the microcatheter was carefully stripped from the delivery device. Control angiogram was performed from the intermediate catheter, demonstrating that there was no flow through the open solitaire. A 3 minute time interval was observed. The balloon at the balloon guide catheter was then inflated under fluoroscopy for proximal flow arrest. Aspiration engine was turned on. Intermediate catheter was gently advanced over the proximal solitaire to the level of the occlusion, with flow arrest. Constant  aspiration using the proprietary engine was then performed at the intermediate catheter, as both the retriever and the intermediate catheter were gently and slowly withdrawn with fluoroscopic observation. Once the retriever was "corked" within the tip of the intermediate catheter, both were removed from the system. Free aspiration was confirmed at the hub of the balloon guide catheter, with free blood return confirmed. The balloon was then deflated, and a control angiogram was performed. Restoration of left MCA flow was confirmed. The initial left A2 segment occlusion was observed to have migrated distally to the pericallosal artery. Given the patient's young age we decided to proceed  with attempt at mechanical thrombectomy of this ACA embolus. ACA thrombectomy attempt: The walrus balloon catheter remained in the distal cervical segment, proximal to the horizontal petrous segment. We advanced a coaxial wire and catheter system which included 014 synchro soft wire, Zoom 35 intermediate catheter, and tree though pro view 18 microcatheter. This was advanced through the cavernous segment to the supraclinoid segment. This catheter and wire combination failed to engage the angulated A1 origin. We then attempted a coaxial system which included CT 5 intermediate catheter, 150cm phenom 21 microcatheter, and synchro soft wire. This coaxial system was advanced through the cavernous segment, supraclinoid segment, and failed to engage the A1 segment. Various micro wires were then used including Aristotle wire, and double angle headliner wire. The 014 headliner wire was ultimately successful engaging the A1 segment, with a pericallosal position achieved. The intermediate catheter would not make the nearly 180 degree turn into the A1 segment. After several attempts at achieving n/a 1 position with the intermediate catheter, we decided that we could attempt to use an anchoring technique by deploying stent tree verb across the  embolism, and then bringing the intermediate catheter over the microcatheter and stent deployed stent tree verb. This would require, however, passing through the embolus with the microcatheter to deployed the stent tree verge. The microcatheter and microwire combination would not pass gently through the thromboembolism within the pericallosal artery, with quite a dense character observed when the wire and microcatheter were at the face of the thrombus. Ultimately we decided to withdraw from this attempt given the difficulty, and the apparent reactivity of the patient's carotid artery as we observed spasm and irregularity at the skull base on each subsequent control angiogram. The microcatheter was withdrawn to the cavernous segment of the ICA. Wire was removed. Exchange length floppy tip transcend wire was then placed through the microcatheter into a safe position. The balloon guide catheter was then withdrawn into the common carotid artery and angiogram was performed. This revealed irregularity and narrowing in the proximal ICA, which continued with multiple variation in diameter through the cervical ICA to the distal segment at the site of the prior inflated balloon. These findings were discussed with the neurology team, with concern for dissection, as there was irregularity at this segment on the preprocedural CT scan, indicating possible vasculitis/dissection as not only the current imaging diagnosis, but potentially site of the thromboembolism origin. At this point we performed flat panel CT scan to observe for ICH/S AH, as treatment with a stent would require dual anti-platelet therapy. Flat panel CT demonstrated thin subarachnoid hemorrhage in the inter hemispheric fissure. Interval angiogram of the ICA was performed to determine the need for any possible stenting. 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 This imaging finding was  concerning for progression of a possible intramural hematoma/dissection and at this point we elected to move forward with stenting. At least 20 minutes were required to place an orogastric tube and then deliver our prescribed dose of aspirin and Plavix. Once these events had occurred, repeat angiogram was performed. Interestingly this image revealed significant improvement at the cervical segment, with residual irregularity (image XA #24). At this point we interpreted this as either improving vaso spasm or improving sequela of vasculitis/dissection. Given the flat panel head CT findings and the desire to avoid dual anti-platelet therapy long-term, we elected to withdraw from the case at this point with no stent deployed. Balloon guide was then removed,  with all catheters and wires removed. The skin at the puncture site was then cleaned with Chlorhexidine. The 8 French sheath was removed and an 84F angioseal was deployed. Patient tolerated the procedure well and remained hemodynamically stable throughout. Blood loss estimated at 150 cc. IMPRESSION: Status post ultrasound guided access right common femoral artery for cervical/cerebral angiogram and mechanical thrombectomy of left MCA restoring complete flow to the left MCA territory, and failed attempt at revascularization of left pericallosal thromboembolism. Flat panel CT at the completion demonstrates thin subarachnoid hemorrhage in the anterior inter hemispheric fissure. Angio-Seal deployed for hemostasis. Signed, Yvone Neu. Reyne Dumas, RPVI [facility] Radiology PLAN: The patient will remain intubated, as she remains COVID status unknown ICU status Target systolic blood pressure of 140-160 Right hip straight time 6 hours Frequent neurovascular checks Repeat neurologic imaging with CT and/MRI at the discretion of neurology team Electronically Signed   By: Gilmer Mor D.O.   On: 05/21/2020 08:57   IR CT [facility]  Result Date: 05/21/2020 INDICATION: 24 year old female with a history of acute left MCA syndrome, presents for mechanical thrombectomy COVID test was confirmed positive after initiation of the case EXAM: ULTRASOUND-GUIDED RIGHT COMMON FEMORAL ARTERY ACCESS CERVICAL AND CEREBRAL ANGIOGRAM MECHANICAL THROMBECTOMY LEFT MCA ANGIO-SEAL FOR HEMOSTASIS COMPARISON:  CT imaging same day MEDICATIONS: 200 mcg intra arterial nitroglycerin. ANESTHESIA/SEDATION: The anesthesia team was present to provide general endotracheal tube anesthesia and for patient monitoring during the procedure. Intubation was performed in negative pressure Bay in neuro IR holding. Left radial arterial line was performed by the anesthesia team. Interventional neuro radiology nursing staff was also present. CONTRAST:  150 cc FLUOROSCOPY TIME:  Fluoroscopy Time: 53 minutes 36 seconds (1651 mGy). COMPLICATIONS: SIR LEVEL C - Requires therapy, minor hospitalization (<48 hrs). Subarachnoid hemorrhage TECHNIQUE: Informed written consent was obtained from the patient's family after a thorough discussion of the procedural risks, benefits and alternatives. Specific risks discussed include: Bleeding, infection, contrast reaction, kidney injury/failure, need for further procedure/surgery, arterial injury or dissection, embolization to new territory, intracranial hemorrhage (10-15% risk), neurologic deterioration, cardiopulmonary collapse, death. All questions were addressed. Maximal Sterile Barrier Technique was utilized including during the procedure including caps, mask, sterile gowns, sterile gloves, sterile drape, hand hygiene and skin antiseptic. A timeout was performed prior to the initiation of the procedure. The anesthesia team was present to provide general endotracheal tube anesthesia and for patient monitoring during the procedure. Interventional neuro radiology nursing staff was also present. FINDINGS: Initial Findings: Left common carotid  artery: ICA patent with no tortuosity and no significant atherosclerosis. Separate branch from the aortic arch. Left external carotid artery: Patent with antegrade flow. Left internal carotid artery: The common carotid artery is relatively straight with no beading or significant narrowing. The AP image on the initial image demonstrates perhaps a slight irregularity on the lateral intimal surface which may correspond to the coronal reformatted images on the comparison CT preprocedure (image 25 of series 9). Given that there was no flow limiting problem, we elected to proceed with the thrombectomy Left MCA: The vertical horizontal petrous segment patent. Segment lacerum patent. Cavernous and supraclinoid patent. Occlusion of the proximal M1 segment with visible filling defect compatible with the findings on the CT. Left ACA: A1 is patent. There is a nearly 180 degree angulation of the A1 origin. Patent anterior communicating artery. There has been migration of the thromboembolism through the terminal segment into the A2 segment compared to the prior CT. Procedural findings:  Baseline MCA flow: TICI 0 Left MCA: After 1 pass, there is no significant restoration of flow. Upon placement of the stent tree bronch the second attempt, and angiogram demonstrated no significant flow through the solitaire. After the second pass, complete restoration of flow through the MCA territory was achieved, TICI 3. During this time, there was further migration of the A2 embolus to the pericallosal artery, with the ultimate occlusion approximately level to the coronal suture. Given that the anatomy would not allow and navigation of the microcatheter and intermediate catheter into the segment, and given the perceived density of the ACA thromboembolism, we ultimately withdrew from further attempts thrombectomy when the wire and microcatheter would not pass through the face of the thrombus. Final angiogram demonstrated no evidence of  extravasation of contrast at this level. The flat panel CT demonstrates inter hemispheric subarachnoid hemorrhage without mass effect. Angiogram of the cervical ICA upon withdrawal of the balloon catheter demonstrated long segment irregularity, with variations in the diameter extending from beyond the bifurcation to the horizontal petrous segment. The irregularity was not only spatial but variations in temporal appearance, as the narrowing worsen to greater than 80%, such as that demonstrated on image 5/7 angiogram 23. After 20 minutes of placing orogastric tube and giving dual antiplatelets dosage, anticipating stenting, a repeat angiogram demonstrated improvement in the appearance of the diameter, image 5/7 of angiogram 24. At this time we elected not to stent. PROCEDURE: The anesthesia team was present to provide general endotracheal tube anesthesia and for patient monitoring during the procedure. Intubation was performed in negative pressure Bay in neuro IR holding. Interventional neuro radiology nursing staff was also present. Ultrasound survey of the right inguinal region was performed with images stored and sent to PACs. 11 blade scalpel was used to make a small incision. Blunt dissection was performed with US guidance. A micropuncture needle was used access the right common femoral artery under ultrasound. With excellent arterial blood flow returned, an .018 micro wire was passed through the needle, observed to enter the abdominal aorta under fluoroscopy. The needle was removed, and a micropuncture sheath was placed over the wire. The inner dilator and wire were removed, and an 035 wire was advanced under fluoroscopy into the abdominal aorta. The sheath was removed and a 25cm 99F straight vascular sheath was placed. The dilator was removed and the sheath was flushed. Sheath was attached to pressurized and heparinized saline bag for constant forward flow. A coaxial system was then advanced over the 035 wire.  This included a 95cm 087 "Walrus" balloon guide with coaxial 125cm Berenstein diagnostic catheter. This was advanced to the proximal descending thoracic aorta. Wire was then removed. Double flush of the catheter was performed. Catheter was then used to select the left common carotid artery. Angiogram was performed. Using roadmap technique, the catheter was advanced over a standard glide wire into left cervical ICA, with luminal position achieved of the guide catheter. Wire was removed and angiogram was performed of the left carotid artery. Initial angiogram revealed no intimal/luminal irregularity and we proceeded with placement of the balloon guide into the distal cervical ICA. The balloon guide and the parents teen catheter were advanced with a Glidewire into the distal cervical segment of the ICA. Glidewire and the Berenstein catheter were gently removed and double flush was performed. Formal angiogram was performed. Road map function was used once the occluded vessel was identified. Copious back flush was performed and the balloon catheter was attached to heparinized and pressurized saline  bag for forward flow. A second coaxial system was then advanced through the balloon catheter, which included the selected intermediate catheter, microcatheter, and microwire. In this scenario, the set up included intermediate catheter, large-bore aspiration catheter, 135 cm, a Trevo Provue18 microcatheter, and 014 synchro soft wire. This system was advanced through the balloon guide catheter under the road-map function, with adequate back-flush at the rotating hemostatic valve at that back end of the balloon guide. Microcatheter and the intermediate catheter system were advanced through the terminal ICA and MCA to the level of the occlusion. The micro wire was then carefully advanced through the occluded segment. Microcatheter was then manipulated through the occluded segment and the wire was removed with saline drip at the  hub. Blood was then aspirated through the hub of the microcatheter, and a gentle contrast injection was performed confirming intraluminal position. A rotating hemostatic valve was then attached to the back end of the microcatheter, and a pressurized and heparinized saline bag was attached to the catheter. 4 x 40 solitaire device was then selected. Back flush was achieved at the rotating hemostatic valve, and then the device was gently advanced through the microcatheter to the distal end. The retriever was then unsheathed by withdrawing the microcatheter under fluoroscopy. Once the retriever was completely unsheathed, the microcatheter was carefully stripped from the delivery device. A 3 minute time interval was observed. The balloon at the balloon guide catheter was then inflated under fluoroscopy for proximal flow arrest. Constant aspiration using the proprietary engine was then performed at the intermediate catheter, as the retriever was gently and slowly withdrawn with fluoroscopic observation. Once the retriever was "corked" within the tip of the intermediate catheter, both were removed from the system. Free aspiration was confirmed at the hub of the balloon guide catheter, with free blood return confirmed. The balloon was then deflated, and a control angiogram was performed. The M1 segment remained occluded after the first pass. We then attempted a second pass. Microcatheter and the intermediate catheter system were again advanced through the terminal ICA and MCA to the level of the occlusion. The micro wire was then carefully advanced through the occluded segment. Microcatheter was then manipulated through the occluded segment and the wire was removed with saline drip at the hub. Blood was then aspirated through the hub of the microcatheter, and a gentle contrast injection was performed confirming intraluminal position. A rotating hemostatic valve was then attached to the back end of the microcatheter, and a  pressurized and heparinized saline bag was attached to the catheter. 4 x 40 solitaire device was again used. Back flush was achieved at the rotating hemostatic valve, and then the device was gently advanced through the microcatheter to the distal end. The retriever was then unsheathed by withdrawing the microcatheter under fluoroscopy. Once the retriever was completely unsheathed, the microcatheter was carefully stripped from the delivery device. Control angiogram was performed from the intermediate catheter, demonstrating that there was no flow through the open solitaire. A 3 minute time interval was observed. The balloon at the balloon guide catheter was then inflated under fluoroscopy for proximal flow arrest. Aspiration engine was turned on. Intermediate catheter was gently advanced over the proximal solitaire to the level of the occlusion, with flow arrest. Constant aspiration using the proprietary engine was then performed at the intermediate catheter, as both the retriever and the intermediate catheter were gently and slowly withdrawn with fluoroscopic observation. Once the retriever was "corked" within the tip of the intermediate catheter, both were  removed from the system. Free aspiration was confirmed at the hub of the balloon guide catheter, with free blood return confirmed. The balloon was then deflated, and a control angiogram was performed. Restoration of left MCA flow was confirmed. The initial left A2 segment occlusion was observed to have migrated distally to the pericallosal artery. Given the patient's young age we decided to proceed with attempt at mechanical thrombectomy of this ACA embolus. ACA thrombectomy attempt: The walrus balloon catheter remained in the distal cervical segment, proximal to the horizontal petrous segment. We advanced a coaxial wire and catheter system which included 014 synchro soft wire, Zoom 35 intermediate catheter, and tree though pro view 18 microcatheter. This was  advanced through the cavernous segment to the supraclinoid segment. This catheter and wire combination failed to engage the angulated A1 origin. We then attempted a coaxial system which included CT 5 intermediate catheter, 150cm phenom 21 microcatheter, and synchro soft wire. This coaxial system was advanced through the cavernous segment, supraclinoid segment, and failed to engage the A1 segment. Various micro wires were then used including Aristotle wire, and double angle headliner wire. The 014 headliner wire was ultimately successful engaging the A1 segment, with a pericallosal position achieved. The intermediate catheter would not make the nearly 180 degree turn into the A1 segment. After several attempts at achieving n/a 1 position with the intermediate catheter, we decided that we could attempt to use an anchoring technique by deploying stent tree verb across the embolism, and then bringing the intermediate catheter over the microcatheter and stent deployed stent tree verb. This would require, however, passing through the embolus with the microcatheter to deployed the stent tree verge. The microcatheter and microwire combination would not pass gently through the thromboembolism within the pericallosal artery, with quite a dense character observed when the wire and microcatheter were at the face of the thrombus. Ultimately we decided to withdraw from this attempt given the difficulty, and the apparent reactivity of the patient's carotid artery as we observed spasm and irregularity at the skull base on each subsequent control angiogram. The microcatheter was withdrawn to the cavernous segment of the ICA. Wire was removed. Exchange length floppy tip transcend wire was then placed through the microcatheter into a safe position. The balloon guide catheter was then withdrawn into the common carotid artery and angiogram was performed. This revealed irregularity and narrowing in the proximal ICA, which continued with  multiple variation in diameter through the cervical ICA to the distal segment at the site of the prior inflated balloon. These findings were discussed with the neurology team, with concern for dissection, as there was irregularity at this segment on the preprocedural CT scan, indicating possible vasculitis/dissection as not only the current imaging diagnosis, but potentially site of the thromboembolism origin. At this point we performed flat panel CT scan to observe for ICH/S AH, as treatment with a stent would require dual anti-platelet therapy. Flat panel CT demonstrated thin subarachnoid hemorrhage in the inter hemispheric fissure. Interval angiogram of the ICA was performed to determine the need for any possible stenting. 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 This imaging finding was concerning for progression of a possible intramural hematoma/dissection and at this point we elected to move forward with stenting. At least 20 minutes were required to place an orogastric tube and then deliver our prescribed dose of aspirin and Plavix. Once these events had occurred, repeat angiogram was performed. Interestingly this image  revealed significant improvement at the cervical segment, with residual irregularity (image XA #24). At this point we interpreted this as either improving vaso spasm or improving sequela of vasculitis/dissection. Given the flat panel head CT findings and the desire to avoid dual anti-platelet therapy long-term, we elected to withdraw from the case at this point with no stent deployed. Balloon guide was then removed, with all catheters and wires removed. The skin at the puncture site was then cleaned with Chlorhexidine. The 8 French sheath was removed and an 9F angioseal was deployed. Patient tolerated the procedure well and remained hemodynamically stable throughout. Blood loss estimated at 150 cc. IMPRESSION: Status post  ultrasound guided access right common femoral artery for cervical/cerebral angiogram and mechanical thrombectomy of left MCA restoring complete flow to the left MCA territory, and failed attempt at revascularization of left pericallosal thromboembolism. Flat panel CT at the completion demonstrates thin subarachnoid hemorrhage in the anterior inter hemispheric fissure. Angio-Seal deployed for hemostasis. Signed, Yvone Neu. Reyne Dumas, RPVI [facility] Radiology PLAN: The patient will remain intubated, as she remains COVID status unknown ICU status Target systolic blood pressure of 140-160 Right hip straight time 6 hours Frequent neurovascular checks Repeat neurologic imaging with CT and/MRI at the discretion of neurology team Electronically Signed   By: Gilmer Mor D.O.   On: 05/21/2020 08:57   IR US Guide Vasc Access Right  Result Date: 05/21/2020 INDICATION: 24 year old female with a history of acute left MCA syndrome, presents for mechanical thrombectomy COVID test was confirmed positive after initiation of the case EXAM: ULTRASOUND-GUIDED RIGHT COMMON FEMORAL ARTERY ACCESS CERVICAL AND CEREBRAL ANGIOGRAM MECHANICAL THROMBECTOMY LEFT MCA ANGIO-SEAL FOR HEMOSTASIS COMPARISON:  CT imaging same day MEDICATIONS: 200 mcg intra arterial nitroglycerin. ANESTHESIA/SEDATION: The anesthesia team was present to provide general endotracheal tube anesthesia and for patient monitoring during the procedure. Intubation was performed in negative pressure [place] in neuro IR holding. Left radial arterial line was performed by the anesthesia team. Interventional neuro radiology nursing staff was also present. CONTRAST:  150 cc FLUOROSCOPY TIME:  Fluoroscopy Time: 53 minutes 36 seconds (1651 mGy). COMPLICATIONS: SIR LEVEL C - Requires therapy, minor hospitalization (<48 hrs). Subarachnoid hemorrhage TECHNIQUE: Informed written consent was obtained from the patient's family after a  thorough discussion of the procedural risks, benefits and alternatives. Specific risks discussed include: Bleeding, infection, contrast reaction, kidney injury/failure, need for further procedure/surgery, arterial injury or dissection, embolization to new territory, intracranial hemorrhage (10-15% risk), neurologic deterioration, cardiopulmonary collapse, death. All questions were addressed. Maximal Sterile Barrier Technique was utilized including during the procedure including caps, mask, sterile gowns, sterile gloves, sterile drape, hand hygiene and skin antiseptic. A timeout was performed prior to the initiation of the procedure. The anesthesia team was present to provide general endotracheal tube anesthesia and for patient monitoring during the procedure. Interventional neuro radiology nursing staff was also present. FINDINGS: Initial Findings: Left common carotid artery: ICA patent with no tortuosity and no significant atherosclerosis. Separate branch from the aortic arch. Left external carotid artery: Patent with antegrade flow. Left internal carotid artery: The common carotid artery is relatively straight with no beading or significant narrowing. The AP image on the initial image demonstrates perhaps a slight irregularity on the lateral intimal surface which may correspond to the coronal reformatted images on the comparison CT preprocedure (image 25 of series 9). Given that there was no flow limiting problem, we elected to proceed with the thrombectomy Left MCA: The vertical horizontal petrous segment patent.  Segment lacerum patent. Cavernous and supraclinoid patent. Occlusion of the proximal M1 segment with visible filling defect compatible with the findings on the CT. Left ACA: A1 is patent. There is a nearly 180 degree angulation of the A1 origin. Patent anterior communicating artery. There has been migration of the thromboembolism through the terminal segment into the A2 segment compared to the prior CT.  Procedural findings: Baseline MCA flow: TICI 0 Left MCA: After 1 pass, there is no significant restoration of flow. Upon placement of the stent tree bronch the second attempt, and angiogram demonstrated no significant flow through the solitaire. After the second pass, complete restoration of flow through the MCA territory was achieved, TICI 3. During this time, there was further migration of the A2 embolus to the pericallosal artery, with the ultimate occlusion approximately level to the coronal suture. Given that the anatomy would not allow and navigation of the microcatheter and intermediate catheter into the segment, and given the perceived density of the ACA thromboembolism, we ultimately withdrew from further attempts thrombectomy when the wire and microcatheter would not pass through the face of the thrombus. Final angiogram demonstrated no evidence of extravasation of contrast at this level. The flat panel CT demonstrates inter hemispheric subarachnoid hemorrhage without mass effect. Angiogram of the cervical ICA upon withdrawal of the balloon catheter demonstrated long segment irregularity, with variations in the diameter extending from beyond the bifurcation to the horizontal petrous segment. The irregularity was not only spatial but variations in temporal appearance, as the narrowing worsen to greater than 80%, such as that demonstrated on image 5/7 angiogram 23. After 20 minutes of placing orogastric tube and giving dual antiplatelets dosage, anticipating stenting, a repeat angiogram demonstrated improvement in the appearance of the diameter, image 5/7 of angiogram 24. At this time we elected not to stent. PROCEDURE: The anesthesia team was present to provide general endotracheal tube anesthesia and for patient monitoring during the procedure. Intubation was performed in negative pressure [place] in neuro IR holding. Interventional neuro radiology nursing staff was also present. Ultrasound survey of the right  inguinal region was performed with images stored and sent to PACs. 11 blade scalpel was used to make a small incision. Blunt dissection was performed with US guidance. A micropuncture needle was used access the right common femoral artery under ultrasound. With excellent arterial blood flow returned, an .018 micro wire was passed through the needle, observed to enter the abdominal aorta under fluoroscopy. The needle was removed, and a micropuncture sheath was placed over the wire. The inner dilator and wire were removed, and an 035 wire was advanced under fluoroscopy into the abdominal aorta. The sheath was removed and a 25cm 62F straight vascular sheath was placed. The dilator was removed and the sheath was flushed. Sheath was attached to pressurized and heparinized saline bag for constant forward flow. A coaxial system was then advanced over the 035 wire. This included a 95cm 087 "Walrus" balloon guide with coaxial 125cm Berenstein diagnostic catheter. This was advanced to the proximal descending thoracic aorta. Wire was then removed. Double flush of the catheter was performed. Catheter was then used to select the left common carotid artery. Angiogram was performed. Using roadmap technique, the catheter was advanced over a standard glide wire into left cervical ICA, with luminal position achieved of the guide catheter. Wire was removed and angiogram was performed of the left carotid artery. Initial angiogram revealed no intimal/luminal irregularity and we proceeded with placement of the balloon guide into the distal cervical  ICA. The balloon guide and the parents teen catheter were advanced with a Glidewire into the distal cervical segment of the ICA. Glidewire and the Berenstein catheter were gently removed and double flush was performed. Formal angiogram was performed. Road map function was used once the occluded vessel was identified. Copious back flush was performed and the balloon catheter was attached to  heparinized and pressurized saline bag for forward flow. A second coaxial system was then advanced through the balloon catheter, which included the selected intermediate catheter, microcatheter, and microwire. In this scenario, the set up included intermediate catheter, large-bore aspiration catheter, 135 cm, a Trevo Provue18 microcatheter, and 014 synchro soft wire. This system was advanced through the balloon guide catheter under the road-map function, with adequate back-flush at the rotating hemostatic valve at that back end of the balloon guide. Microcatheter and the intermediate catheter system were advanced through the terminal ICA and MCA to the level of the occlusion. The micro wire was then carefully advanced through the occluded segment. Microcatheter was then manipulated through the occluded segment and the wire was removed with saline drip at the hub. Blood was then aspirated through the hub of the microcatheter, and a gentle contrast injection was performed confirming intraluminal position. A rotating hemostatic valve was then attached to the back end of the microcatheter, and a pressurized and heparinized saline bag was attached to the catheter. 4 x 40 solitaire device was then selected. Back flush was achieved at the rotating hemostatic valve, and then the device was gently advanced through the microcatheter to the distal end. The retriever was then unsheathed by withdrawing the microcatheter under fluoroscopy. Once the retriever was completely unsheathed, the microcatheter was carefully stripped from the delivery device. A 3 minute time interval was observed. The balloon at the balloon guide catheter was then inflated under fluoroscopy for proximal flow arrest. Constant aspiration using the proprietary engine was then performed at the intermediate catheter, as the retriever was gently and slowly withdrawn with fluoroscopic observation. Once the retriever was "corked" within the tip of the  intermediate catheter, both were removed from the system. Free aspiration was confirmed at the hub of the balloon guide catheter, with free blood return confirmed. The balloon was then deflated, and a control angiogram was performed. The M1 segment remained occluded after the first pass. We then attempted a second pass. Microcatheter and the intermediate catheter system were again advanced through the terminal ICA and MCA to the level of the occlusion. The micro wire was then carefully advanced through the occluded segment. Microcatheter was then manipulated through the occluded segment and the wire was removed with saline drip at the hub. Blood was then aspirated through the hub of the microcatheter, and a gentle contrast injection was performed confirming intraluminal position. A rotating hemostatic valve was then attached to the back end of the microcatheter, and a pressurized and heparinized saline bag was attached to the catheter. 4 x 40 solitaire device was again used. Back flush was achieved at the rotating hemostatic valve, and then the device was gently advanced through the microcatheter to the distal end. The retriever was then unsheathed by withdrawing the microcatheter under fluoroscopy. Once the retriever was completely unsheathed, the microcatheter was carefully stripped from the delivery device. Control angiogram was performed from the intermediate catheter, demonstrating that there was no flow through the open solitaire. A 3 minute time interval was observed. The balloon at the balloon guide catheter was then inflated under fluoroscopy for proximal flow arrest.  Aspiration engine was turned on. Intermediate catheter was gently advanced over the proximal solitaire to the level of the occlusion, with flow arrest. Constant aspiration using the proprietary engine was then performed at the intermediate catheter, as both the retriever and the intermediate catheter were gently and slowly withdrawn with  fluoroscopic observation. Once the retriever was "corked" within the tip of the intermediate catheter, both were removed from the system. Free aspiration was confirmed at the hub of the balloon guide catheter, with free blood return confirmed. The balloon was then deflated, and a control angiogram was performed. Restoration of left MCA flow was confirmed. The initial left A2 segment occlusion was observed to have migrated distally to the pericallosal artery. Given the patient's young age we decided to proceed with attempt at mechanical thrombectomy of this ACA embolus. ACA thrombectomy attempt: The walrus balloon catheter remained in the distal cervical segment, proximal to the horizontal petrous segment. We advanced a coaxial wire and catheter system which included 014 synchro soft wire, Zoom 35 intermediate catheter, and tree though pro view 18 microcatheter. This was advanced through the cavernous segment to the supraclinoid segment. This catheter and wire combination failed to engage the angulated A1 origin. We then attempted a coaxial system which included CT 5 intermediate catheter, 150cm phenom 21 microcatheter, and synchro soft wire. This coaxial system was advanced through the cavernous segment, supraclinoid segment, and failed to engage the A1 segment. Various micro wires were then used including Aristotle wire, and double angle headliner wire. The 014 headliner wire was ultimately successful engaging the A1 segment, with a pericallosal position achieved. The intermediate catheter would not make the nearly 180 degree turn into the A1 segment. After several attempts at achieving n/a 1 position with the intermediate catheter, we decided that we could attempt to use an anchoring technique by deploying stent tree verb across the embolism, and then bringing the intermediate catheter over the microcatheter and stent deployed stent tree verb. This would require, however, passing through the embolus with the  microcatheter to deployed the stent tree verge. The microcatheter and microwire combination would not pass gently through the thromboembolism within the pericallosal artery, with quite a dense character observed when the wire and microcatheter were at the face of the thrombus. Ultimately we decided to withdraw from this attempt given the difficulty, and the apparent reactivity of the patient's carotid artery as we observed spasm and irregularity at the skull base on each subsequent control angiogram. The microcatheter was withdrawn to the cavernous segment of the ICA. Wire was removed. Exchange length floppy tip transcend wire was then placed through the microcatheter into a safe position. The balloon guide catheter was then withdrawn into the common carotid artery and angiogram was performed. This revealed irregularity and narrowing in the proximal ICA, which continued with multiple variation in diameter through the cervical ICA to the distal segment at the site of the prior inflated balloon. These findings were discussed with the neurology team, with concern for dissection, as there was irregularity at this segment on the preprocedural CT scan, indicating possible vasculitis/dissection as not only the current imaging diagnosis, but potentially site of the thromboembolism origin. At this point we performed flat panel CT scan to observe for ICH/S AH, as treatment with a stent would require dual anti-platelet therapy. Flat panel CT demonstrated thin subarachnoid hemorrhage in the inter hemispheric fissure. Interval angiogram of the ICA was performed to determine the need for any possible stenting. This angiogram (image XA #22) revealed significant  worsening of the cervical ICA, with greater than 80% stenosis. This imaging finding was concerning for progression of a possible intramural hematoma/dissection and at this point we elected to move forward with stenting. At least 20 minutes were required to place an  orogastric tube and then deliver our prescribed dose of aspirin and Plavix. Once these events had occurred, repeat angiogram was performed. Interestingly this image revealed significant improvement at the cervical segment, with residual irregularity (image XA #24). At this point we interpreted this as either improving vaso spasm or improving sequela of vasculitis/dissection. Given the flat panel head CT findings and the desire to avoid dual anti-platelet therapy long-term, we elected to withdraw from the case at this point with no stent deployed. Balloon guide was then removed, with all catheters and wires removed. The skin at the puncture site was then cleaned with Chlorhexidine. The 8 French sheath was removed and an 26F angioseal was deployed. Patient tolerated the procedure well and remained hemodynamically stable throughout. Blood loss estimated at 150 cc. IMPRESSION: Status post ultrasound guided access right common femoral artery for cervical/cerebral angiogram and mechanical thrombectomy of left MCA restoring complete flow to the left MCA territory, and failed attempt at revascularization of left pericallosal thromboembolism. Flat panel CT at the completion demonstrates thin subarachnoid hemorrhage in the anterior inter hemispheric fissure. Angio-Seal deployed for hemostasis. Signed, Yvone Neu. Reyne Dumas, RPVI [facility] Radiology PLAN: The patient will remain intubated, as she remains COVID status unknown ICU status Target systolic blood pressure of 140-160 Right hip straight time 6 hours Frequent neurovascular checks Repeat neurologic imaging with CT and/MRI at the discretion of neurology team Electronically Signed   By: Gilmer Mor D.O.   On: 05/21/2020 08:57   DG Chest Port 1 View  Result Date: 05/31/2020 CLINICAL DATA:  Sob,code stroke ,COVID positive EXAM: PORTABLE CHEST 1 VIEW COMPARISON:  Chest radiograph 05/28/2020 FINDINGS: Stable  cardiomediastinal contours with enlarged heart size. Minimal opacities at the right lung base likely reflecting atelectasis. Left lung is clear. No pneumothorax or significant pleural effusion. No acute finding in the visualized skeleton. IMPRESSION: Minimal opacities at the right lung base likely reflecting atelectasis. Electronically Signed   By: Emmaline Kluver M.D.   On: 05/31/2020 09:08   DG CHEST PORT 1 VIEW  Result Date: 05/28/2020 CLINICAL DATA:  Fever.  COVID positive. EXAM: PORTABLE CHEST 1 VIEW COMPARISON:  05/22/2020 FINDINGS: Midline trachea.  Normal heart size and mediastinal contours. Sharp costophrenic angles. No pneumothorax. Clear lungs. Mild right hemidiaphragm elevation. IMPRESSION: No active disease. Electronically Signed   By: Jeronimo Greaves M.D.   On: 05/28/2020 08:14   DG CHEST PORT 1 VIEW  Result Date: 05/22/2020 CLINICAL DATA:  COVID-19 positive. EXAM: PORTABLE CHEST 1 VIEW COMPARISON:  May 20, 2020. FINDINGS: Stable cardiomediastinal silhouette. Endotracheal and nasogastric tubes have been removed. No pneumothorax or pleural effusion is noted. Lungs are clear. Bony thorax is unremarkable. IMPRESSION: No active disease. Electronically Signed   By: Lupita Raider M.D.   On: 05/22/2020 08:36   DG CHEST PORT 1 VIEW  Result Date: 05/20/2020 CLINICAL DATA:  CABG 19 positive, respiratory failure EXAM: PORTABLE CHEST 1 VIEW COMPARISON:  05/19/2020 chest radiograph. FINDINGS: Endotracheal tube tip is 4.1 cm above the carina. Enteric tube terminates in the gastric fundus. Stable cardiomediastinal silhouette with normal heart size. No pneumothorax. No pleural effusion. Mild platelike atelectasis in the mid to lower lungs. No pulmonary edema. IMPRESSION: 1. Well-positioned support structures. 2.  Mild platelike atelectasis in the mid to lower lungs. Electronically Signed   By: Delbert Phenix M.D.   On: 05/20/2020 09:09   DG CHEST PORT 1 VIEW  Result Date: 05/19/2020 CLINICAL DATA:   LEFT MCA infarct and intracranial hemorrhage. Respiratory failure. COVID positive. EXAM: PORTABLE CHEST 1 VIEW COMPARISON:  11/07/2016 chest radiograph FINDINGS: An endotracheal tube is noted with tip 3 cm above the carina. An NG tube is noted coiled in the stomach. This is a mildly low volume film with mild bibasilar opacities which may represent atelectasis or airspace disease. No pleural effusion or pneumothorax. No acute bony abnormalities are present. IMPRESSION: 1. Endotracheal tube and NG tube placement. 2. Mildly low volume film with mild bibasilar opacities which may represent atelectasis or infection/pneumonia. Electronically Signed   By: Harmon Pier M.D.   On: 05/19/2020 18:03   DG Swallowing Func-Speech Pathology  Result Date: 05/22/2020 Objective Swallowing Evaluation: Type of Study: MBS-Modified Barium Swallow Study  Patient Details Name: Gail Montgomery MRN: [id number] Date of Birth: [birth year] Today's Date: 05/22/2020 Time: SLP Start Time (ACUTE ONLY): 1449 -SLP Stop Time (ACUTE ONLY): 1509 SLP Time Calculation (min) (ACUTE ONLY): 20 min Past Medical History: Past Medical History: Diagnosis Date . Anxiety  . Panic attack  Past Surgical History: Past Surgical History: Procedure Laterality Date . IR CT HEAD LTD  05/19/2020 . IR CT HEAD LTD  05/19/2020 . IR PERCUTANEOUS ART THROMBECTOMY/INFUSION INTRACRANIAL Montgomery DIAG ANGIO  05/19/2020 . IR US GUIDE VASC ACCESS RIGHT  05/19/2020 . RADIOLOGY WITH ANESTHESIA N/A 05/19/2020  Procedure: IR WITH ANESTHESIA;  Surgeon: Radiologist, Medication, MD;  Location: [facility] OR;  Service: Radiology;  Laterality: N/A; HPI: Pt is a 24 yo female presenting with sudden onset R sided weakness and aphasia. 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 She was intubated for thrombectomy 8/21. 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 MRI showed early subacute infarct of the left basal ganglia involving the posterior putamen and caudate body. Pt self-extubated 8/22.  Pt tested (+) for COVID-19 but was asymptomatic upon arrival. PMH: panic attack, anxiety  Subjective: alert, cooperative, aphasic Assessment / Plan / Recommendation CHL IP CLINICAL IMPRESSIONS 05/22/2020 Clinical Impression Pt presents with a mild oral dysphagia, but with relatively intact pharyngeal function. She has reduced labial seal with anterior spillage that happens with thin liquids via cup and straw, and is not necessarily localized to one side of her mouth. She has lingual pumping, with more time needed to clear solids from her oral cavity compared to liquids. Lingual pumping with solids is sometimes moderately prolonged, and she is not able to clear the barium tablet before losing it sublingually. After she is able to retrieve it, she masticated it rather than trying again to swallow it whole. There is some premature spillage with thin liquids, at times filling the valleculae and pyriform sinuses and with varying amounts of time that passes before she swallows, dependent upon how much longer it takes her to clear her mouth. Despite this, there is no aspiration across challenging. She had a few instances of trace and transient penetration. Throat clearing was not noted with each episode of transient penetration, but was also not observed outside of these episodes. Recommend that pt begin Dys 3 (mechanical soft) diet and thin liquids.  SLP Visit Diagnosis Dysphagia, oral phase (R13.11) Attention and concentration deficit following -- Frontal lobe and executive function deficit following -- Impact on safety and function Mild aspiration risk  CHL IP TREATMENT RECOMMENDATION 05/22/2020 Treatment Recommendations Therapy as outlined in treatment plan below   Prognosis 05/22/2020 Prognosis for Safe Diet Advancement Good Barriers to Reach Goals Language deficits Barriers/Prognosis Comment -- CHL IP DIET RECOMMENDATION 05/22/2020 SLP Diet Recommendations Dysphagia 3 (Mech soft) solids;Thin liquid Liquid Administration  via Straw;Cup Medication Administration Whole meds with puree Compensations Slow rate;Small sips/bites Postural Changes Seated upright at 90 degrees   CHL IP OTHER RECOMMENDATIONS 05/22/2020 Recommended Consults -- Oral Care Recommendations Oral care BID Other Recommendations --   CHL IP FOLLOW UP RECOMMENDATIONS 05/22/2020 Follow up Recommendations Inpatient Rehab   CHL IP FREQUENCY AND DURATION 05/22/2020 Speech Therapy Frequency (ACUTE ONLY) min 2x/week Treatment Duration 2 weeks      CHL IP ORAL PHASE 05/22/2020 Oral Phase Impaired Oral - Pudding Teaspoon -- Oral - Pudding Cup -- Oral - Honey Teaspoon -- Oral - Honey Cup -- Oral - Nectar Teaspoon -- Oral - Nectar Cup -- Oral - Nectar Straw -- Oral - Thin Teaspoon -- Oral - Thin Cup Left anterior bolus loss;Right anterior bolus loss;Lingual pumping;Reduced posterior propulsion;Delayed oral transit;Decreased bolus cohesion;Premature spillage Oral - Thin Straw Left anterior bolus loss;Right anterior bolus loss;Lingual pumping;Reduced posterior propulsion;Delayed oral transit;Decreased bolus cohesion;Premature spillage Oral - Puree Left anterior bolus loss;Right anterior bolus loss;Lingual pumping;Reduced posterior propulsion;Delayed oral transit;Decreased bolus cohesion Oral - Mech Soft Left anterior bolus loss;Right anterior bolus loss;Lingual pumping;Reduced posterior propulsion;Delayed oral transit;Decreased bolus cohesion;Lingual/palatal residue Oral - Regular -- Oral - Multi-Consistency -- Oral - Pill Left anterior bolus loss;Right anterior bolus loss;Lingual pumping;Reduced posterior propulsion;Delayed oral transit;Pocketing in anterior sulcus Oral Phase - Comment --  CHL IP PHARYNGEAL PHASE 05/22/2020 Pharyngeal Phase Impaired Pharyngeal- Pudding Teaspoon -- Pharyngeal -- Pharyngeal- Pudding Cup -- Pharyngeal -- Pharyngeal- Honey Teaspoon -- Pharyngeal -- Pharyngeal- Honey Cup -- Pharyngeal -- Pharyngeal- Nectar Teaspoon -- Pharyngeal -- Pharyngeal- Nectar Cup --  Pharyngeal -- Pharyngeal- Nectar Straw -- Pharyngeal -- Pharyngeal- Thin Teaspoon -- Pharyngeal -- Pharyngeal- Thin Cup Penetration/Aspiration before swallow Pharyngeal Material enters airway, remains ABOVE vocal cords then ejected out Pharyngeal- Thin Straw Penetration/Aspiration before swallow Pharyngeal Material enters airway, remains ABOVE vocal cords then ejected out Pharyngeal- Puree WFL Pharyngeal -- Pharyngeal- Mechanical Soft WFL Pharyngeal -- Pharyngeal- Regular -- Pharyngeal -- Pharyngeal- Multi-consistency -- Pharyngeal -- Pharyngeal- Pill WFL Pharyngeal -- Pharyngeal Comment --  CHL IP CERVICAL ESOPHAGEAL PHASE 05/22/2020 Cervical Esophageal Phase WFL Pudding Teaspoon -- Pudding Cup -- Honey Teaspoon -- Honey Cup -- Nectar Teaspoon -- Nectar Cup -- Nectar Straw -- Thin Teaspoon -- Thin Cup -- Thin Straw -- Puree -- Mechanical Soft -- Regular -- Multi-consistency -- Pill -- Cervical Esophageal Comment -- Mahala Menghini., M.A. CCC-SLP Acute Rehabilitation Services Pager [phone] Office [phone] 05/22/2020, 3:28 PM              EEG adult  Result Date: 06/01/2020 Charlsie Quest, MD     06/01/2020  5:25 PM Patient Name: Gail Montgomery MRN: [id number] Epilepsy Attending: Charlsie Quest Referring Physician/Provider: Dr. Susa Raring Date: 06/01/2020 Duration: 23.16 minutes Patient history: 24 year old female presented with right-sided weakness and aphasia.  MRI showed left MCA infarct.  EEG to evaluate for seizures. Level of alertness: Awake AEDs during EEG study: None Technical aspects: This EEG study was done with scalp electrodes positioned according to the 10-20 International system of electrode placement. Electrical activity was acquired at a sampling rate of 500Hz  and reviewed with a high frequency filter of 70Hz  and a low frequency filter of 1Hz . EEG data  were recorded continuously and digitally stored. Description: No posterior dominant was seen.  EEG showed continuous generalized and lateralized  left hemisphere 3 to 5 Hz theta-delta slowing.  2 to 3 Hz rhythmic delta slowing was also noted in left hemisphere, maximal left posterior quadrant.  Hyperventilation and photic stimulation were not performed.   Of note, study was technically difficult due to significant electrode and movement artifact. ABNORMALITY - Continuous slow, generalized and lateralized left hemisphere - Intermittent rhythmic delta slowing, left hemisphere, maximal left posterior quadrant IMPRESSION: This technically difficult study is suggestive of cortical dysfunction in left hemisphere, maximal left posterior quadrant.  Lateralized rhythmic delta activity was seen in left hemisphere which is on the ictal-interictal continuum with low potential for seizures.  Additionally, there is evidence of moderate diffuse encephalopathy, nonspecific etiology.  No seizures or epileptiform discharges were seen throughout the recording. Priyanka O Yadav   VAS [place] TRANSCRANIAL DOPPLER W BUBBLES  Result Date: 05/28/2020  Transcranial Doppler with Bubble Indications: Stroke. Comparison Study: No prior study Performing Technologist: Gertie Fey MHA, RDMS, RVT, RDCS  Examination Guidelines: A complete evaluation includes B-mode imaging, spectral Doppler, color Doppler, and power Doppler as needed of all accessible portions of each vessel. Bilateral testing is considered an integral part of a complete examination. Limited examinations for reoccurring indications may be performed as noted.  Summary:  A vascular evaluation was performed. The left middle cerebral artery was studied. An IV was inserted into the patient's left PICC line. Verbal informed consent was obtained.  HITS (high intensity transient signals) were heard at rest, suggestive of a possible Spencer Grade 3 PFO. 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  Diagnosing physician: Delia Heady MD  Electronically signed by Delia Heady MD on 05/28/2020 at 1:43:29 PM.    Final    ECHOCARDIOGRAM COMPLETE  Result Date: 05/20/2020    ECHOCARDIOGRAM REPORT   Patient Name:   Gail Montgomery Date of Exam: 05/20/2020 Medical Rec #:  [id number]    Height:       64.0 in Accession #:    [id number]   Weight:       185.6 lb Date of Birth:  09/25/1996    BSA:          1.895 m Patient Age:    24 years     BP:           127/71 mmHg Patient Gender: F            HR:           64 bpm. Exam Location:  Inpatient Procedure: 2D Echo Indications:    Stroke  History:        Patient has no prior history of Echocardiogram examinations.                 Risk Factors:Current Smoker. Covid Positive.  Sonographer:    Jeryl Columbia Referring Phys: [phone] MCNEILL P KIRKPATRICK IMPRESSIONS  1. Left ventricular ejection fraction, by estimation, is 60 to 65%. The left ventricle has normal function. The left ventricle has no regional wall motion abnormalities. Left ventricular diastolic parameters were normal.  2. Right ventricular systolic function is normal. The right ventricular size is normal.  3. The mitral valve is normal in structure. Trivial mitral valve regurgitation. No evidence of mitral stenosis.  4. The aortic valve is normal in structure. Aortic valve regurgitation is not visualized. No aortic stenosis is present.  5. The inferior vena cava is normal in size with greater than 50% respiratory variability, suggesting right atrial pressure of 3 mmHg. Comparison(s): No prior Echocardiogram. Conclusion(s)/Recommendation(s): Normal biventricular function without evidence of hemodynamically significant valvular heart disease. No intracardiac source of embolism detected on this transthoracic study. A transesophageal echocardiogram is recommended to exclude cardiac source of embolism if clinically indicated. FINDINGS  Left Ventricle: Left ventricular ejection fraction, by estimation, is 60 to 65%. The left ventricle has normal function. The  left ventricle has no regional wall motion abnormalities. The left ventricular internal cavity size was normal in size. There is  no left ventricular hypertrophy. Left ventricular diastolic parameters were normal. Right Ventricle: The right ventricular size is normal. No increase in right ventricular wall thickness. Right ventricular systolic function is normal. Left Atrium: Left atrial size was normal in size. Right Atrium: Right atrial size was normal in size. Pericardium: There is no evidence of pericardial effusion. Mitral Valve: The mitral valve is normal in structure. Normal mobility of the mitral valve leaflets. Trivial mitral valve regurgitation. No evidence of mitral valve stenosis. Tricuspid Valve: The tricuspid valve is normal in structure. Tricuspid valve regurgitation is trivial. No evidence of tricuspid stenosis. Aortic Valve: The aortic valve is normal in structure. Aortic valve regurgitation is not visualized. No aortic stenosis is present. Pulmonic Valve: The pulmonic valve was normal in structure. Pulmonic valve regurgitation is not visualized. No evidence of pulmonic stenosis. Aorta: The aortic root is normal in size and structure. Venous: The inferior vena cava is normal in size with greater than 50% respiratory variability, suggesting right atrial pressure of 3 mmHg. IAS/Shunts: No atrial level shunt detected by color flow Doppler.  LEFT VENTRICLE PLAX 2D LVIDd:         4.00 cm LVIDs:         2.60 cm LV PW:         1.20 cm LV IVS:        1.00 cm LVOT diam:     1.90 cm LVOT Area:     2.84 cm  RIGHT VENTRICLE TAPSE (M-mode): 2.6 cm LEFT ATRIUM             Index       RIGHT ATRIUM           Index LA diam:        3.00 cm 1.58 cm/m  RA Area:     14.30 cm LA Vol (A2C):   52.4 ml 27.65 ml/m RA Volume:   36.50 ml  19.26 ml/m LA Vol (A4C):   38.2 ml 20.15 ml/m LA Biplane Vol: 44.9 ml 23.69 ml/m   AORTA Ao Root diam: 2.70 cm  SHUNTS Systemic Diam: 1.90 cm Tobias Alexander MD Electronically signed by  Tobias Alexander MD Signature Date/Time: 05/20/2020/4:13:45 PM    Final    ECHO TEE  Result Date: 06/06/2020    TRANSESOPHOGEAL ECHO REPORT   Patient Name:   Gail Montgomery Date of Exam: 06/06/2020 Medical Rec #:  [id number]    Height:       64.0 in Accession #:    [id number]   Weight:       187.6 lb Date of Birth:  1996-05-26    BSA:          1.904 m Patient Age:    24 years     BP:           119/69 mmHg Patient Gender: F  HR:           94 bpm. Exam Location:  Inpatient Procedure: 3D Echo, Color Doppler, Cardiac Doppler and Transesophageal Echo Indications:     Stroke 434.91 / I63.9  History:         Patient has prior history of Echocardiogram examinations, most                  recent 05/29/2020. Risk Factors:Current Smoker. ICA occlusion,                  DVT and PE, History of COVID 04/2020.  Sonographer:     Leta Jungling RDCS Referring Phys:  [address] IN[place] Diagnosing Phys: Zoila Shutter MD PROCEDURE: The transesophogeal probe was passed without difficulty through the esophogus of the patient. Sedation performed by different physician. The patient was monitored while under deep sedation. Anesthestetic sedation was provided intravenously by Anesthesiology: 208.71mg  of Propofol. The patient developed no complications during the procedure. A direct current cardioversion was performed. IMPRESSIONS  1. Left ventricular ejection fraction, by estimation, is 60 to 65%. The left ventricle has normal function.  2. Right ventricular systolic function is normal. The right ventricular size is normal.  3. No left atrial/left atrial appendage thrombus was detected.  4. The mitral valve is grossly normal. Trivial mitral valve regurgitation.  5. The aortic valve is tricuspid. Aortic valve regurgitation is not visualized.  6. Evidence of atrial level shunting detected by color flow Doppler. Agitated saline contrast bubble study was positive with shunting observed within 3-6 cardiac cycles suggestive of interatrial  shunt. There is a moderately sized patent foramen ovale with bidirectional shunting across atrial septum. Conclusion(s)/Recommendation(s): Findings are concerning for an interatrial shunt as detailed above. Moderate sized bidirectional PFO. Given young age and recent stroke, would recommend structural heart team evaluation for PFO closure. FINDINGS  Left Ventricle: Left ventricular ejection fraction, by estimation, is 60 to 65%. The left ventricle has normal function. The left ventricular internal cavity size was normal in size. There is no left ventricular hypertrophy. Right Ventricle: The right ventricular size is normal. No increase in right ventricular wall thickness. Right ventricular systolic function is normal. Left Atrium: Left atrial size was normal in size. No left atrial/left atrial appendage thrombus was detected. Right Atrium: Right atrial size was normal in size. Pericardium: There is no evidence of pericardial effusion. Mitral Valve: The mitral valve is grossly normal. Trivial mitral valve regurgitation. Tricuspid Valve: The tricuspid valve is grossly normal. Tricuspid valve regurgitation is trivial. Aortic Valve: The aortic valve is tricuspid. Aortic valve regurgitation is not visualized. Pulmonic Valve: The pulmonic valve was normal in structure. Pulmonic valve regurgitation is not visualized. Aorta: The aortic root and ascending aorta are structurally normal, with no evidence of dilitation. IAS/Shunts: The interatrial septum is aneurysmal. Evidence of atrial level shunting detected by color flow Doppler. Agitated saline contrast was given intravenously to evaluate for intracardiac shunting. Agitated saline contrast bubble study was positive  with shunting observed within 3-6 cardiac cycles suggestive of interatrial shunt. A moderately sized patent foramen ovale is detected with bidirectional shunting across atrial septum. Zoila Shutter MD Electronically signed by Zoila Shutter MD Signature  Date/Time: 06/06/2020/3:12:47 PM    Final    IR PERCUTANEOUS ART THROMBECTOMY/INFUSION INTRACRANIAL Montgomery DIAG ANGIO  Result Date: 05/21/2020 INDICATION: 24 year old female with a history of acute left MCA syndrome, presents for mechanical thrombectomy COVID test was confirmed positive after initiation of the case EXAM: ULTRASOUND-GUIDED RIGHT COMMON FEMORAL ARTERY  ACCESS CERVICAL AND CEREBRAL ANGIOGRAM MECHANICAL THROMBECTOMY LEFT MCA ANGIO-SEAL FOR HEMOSTASIS COMPARISON:  CT imaging same day MEDICATIONS: 200 mcg intra arterial nitroglycerin. ANESTHESIA/SEDATION: The anesthesia team was present to provide general endotracheal tube anesthesia and for patient monitoring during the procedure. Intubation was performed in negative pressure [place] in neuro IR holding. Left radial arterial line was performed by the anesthesia team. Interventional neuro radiology nursing staff was also present. CONTRAST:  150 cc FLUOROSCOPY TIME:  Fluoroscopy Time: 53 minutes 36 seconds (1651 mGy). COMPLICATIONS: SIR LEVEL C - Requires therapy, minor hospitalization (<48 hrs). Subarachnoid hemorrhage TECHNIQUE: Informed written consent was obtained from the patient's family after a thorough discussion of the procedural risks, benefits and alternatives. Specific risks discussed include: Bleeding, infection, contrast reaction, kidney injury/failure, need for further procedure/surgery, arterial injury or dissection, embolization to new territory, intracranial hemorrhage (10-15% risk), neurologic deterioration, cardiopulmonary collapse, death. All questions were addressed. Maximal Sterile Barrier Technique was utilized including during the procedure including caps, mask, sterile gowns, sterile gloves, sterile drape, hand hygiene and skin antiseptic. A timeout was performed prior to the initiation of the procedure. The anesthesia team was present to provide general endotracheal tube anesthesia and for patient monitoring during the procedure.  Interventional neuro radiology nursing staff was also present. FINDINGS: Initial Findings: Left common carotid artery: ICA patent with no tortuosity and no significant atherosclerosis. Separate branch from the aortic arch. Left external carotid artery: Patent with antegrade flow. Left internal carotid artery: The common carotid artery is relatively straight with no beading or significant narrowing. The AP image on the initial image demonstrates perhaps a slight irregularity on the lateral intimal surface which may correspond to the coronal reformatted images on the comparison CT preprocedure (image 25 of series 9). Given that there was no flow limiting problem, we elected to proceed with the thrombectomy Left MCA: The vertical horizontal petrous segment patent. Segment lacerum patent. Cavernous and supraclinoid patent. Occlusion of the proximal M1 segment with visible filling defect compatible with the findings on the CT. Left ACA: A1 is patent. There is a nearly 180 degree angulation of the A1 origin. Patent anterior communicating artery. There has been migration of the thromboembolism through the terminal segment into the A2 segment compared to the prior CT. Procedural findings: Baseline MCA flow: TICI 0 Left MCA: After 1 pass, there is no significant restoration of flow. Upon placement of the stent tree bronch the second attempt, and angiogram demonstrated no significant flow through the solitaire. After the second pass, complete restoration of flow through the MCA territory was achieved, TICI 3. During this time, there was further migration of the A2 embolus to the pericallosal artery, with the ultimate occlusion approximately level to the coronal suture. Given that the anatomy would not allow and navigation of the microcatheter and intermediate catheter into the segment, and given the perceived density of the ACA thromboembolism, we ultimately withdrew from further attempts thrombectomy when the wire and  microcatheter would not pass through the face of the thrombus. Final angiogram demonstrated no evidence of extravasation of contrast at this level. The flat panel CT demonstrates inter hemispheric subarachnoid hemorrhage without mass effect. Angiogram of the cervical ICA upon withdrawal of the balloon catheter demonstrated long segment irregularity, with variations in the diameter extending from beyond the bifurcation to the horizontal petrous segment. The irregularity was not only spatial but variations in temporal appearance, as the narrowing worsen to greater than 80%, such as that demonstrated on image 5/7 angiogram 23. After 20 minutes  of placing orogastric tube and giving dual antiplatelets dosage, anticipating stenting, a repeat angiogram demonstrated improvement in the appearance of the diameter, image 5/7 of angiogram 24. At this time we elected not to stent. PROCEDURE: The anesthesia team was present to provide general endotracheal tube anesthesia and for patient monitoring during the procedure. Intubation was performed in negative pressure [place] in neuro IR holding. Interventional neuro radiology nursing staff was also present. Ultrasound survey of the right inguinal region was performed with images stored and sent to PACs. 11 blade scalpel was used to make a small incision. Blunt dissection was performed with US guidance. A micropuncture needle was used access the right common femoral artery under ultrasound. With excellent arterial blood flow returned, an .018 micro wire was passed through the needle, observed to enter the abdominal aorta under fluoroscopy. The needle was removed, and a micropuncture sheath was placed over the wire. The inner dilator and wire were removed, and an 035 wire was advanced under fluoroscopy into the abdominal aorta. The sheath was removed and a 25cm 4F straight vascular sheath was placed. The dilator was removed and the sheath was flushed. Sheath was attached to pressurized  and heparinized saline bag for constant forward flow. A coaxial system was then advanced over the 035 wire. This included a 95cm 087 "Walrus" balloon guide with coaxial 125cm Berenstein diagnostic catheter. This was advanced to the proximal descending thoracic aorta. Wire was then removed. Double flush of the catheter was performed. Catheter was then used to select the left common carotid artery. Angiogram was performed. Using roadmap technique, the catheter was advanced over a standard glide wire into left cervical ICA, with luminal position achieved of the guide catheter. Wire was removed and angiogram was performed of the left carotid artery. Initial angiogram revealed no intimal/luminal irregularity and we proceeded with placement of the balloon guide into the distal cervical ICA. The balloon guide and the parents teen catheter were advanced with a Glidewire into the distal cervical segment of the ICA. Glidewire and the Berenstein catheter were gently removed and double flush was performed. Formal angiogram was performed. Road map function was used once the occluded vessel was identified. Copious back flush was performed and the balloon catheter was attached to heparinized and pressurized saline bag for forward flow. A second coaxial system was then advanced through the balloon catheter, which included the selected intermediate catheter, microcatheter, and microwire. In this scenario, the set up included intermediate catheter, large-bore aspiration catheter, 135 cm, a Trevo Provue18 microcatheter, and 014 synchro soft wire. This system was advanced through the balloon guide catheter under the road-map function, with adequate back-flush at the rotating hemostatic valve at that back end of the balloon guide. Microcatheter and the intermediate catheter system were advanced through the terminal ICA and MCA to the level of the occlusion. The micro wire was then carefully advanced through the occluded segment.  Microcatheter was then manipulated through the occluded segment and the wire was removed with saline drip at the hub. Blood was then aspirated through the hub of the microcatheter, and a gentle contrast injection was performed confirming intraluminal position. A rotating hemostatic valve was then attached to the back end of the microcatheter, and a pressurized and heparinized saline bag was attached to the catheter. 4 x 40 solitaire device was then selected. Back flush was achieved at the rotating hemostatic valve, and then the device was gently advanced through the microcatheter to the distal end. The retriever was then unsheathed by withdrawing  the microcatheter under fluoroscopy. Once the retriever was completely unsheathed, the microcatheter was carefully stripped from the delivery device. A 3 minute time interval was observed. The balloon at the balloon guide catheter was then inflated under fluoroscopy for proximal flow arrest. Constant aspiration using the proprietary engine was then performed at the intermediate catheter, as the retriever was gently and slowly withdrawn with fluoroscopic observation. Once the retriever was "corked" within the tip of the intermediate catheter, both were removed from the system. Free aspiration was confirmed at the hub of the balloon guide catheter, with free blood return confirmed. The balloon was then deflated, and a control angiogram was performed. The M1 segment remained occluded after the first pass. We then attempted a second pass. Microcatheter and the intermediate catheter system were again advanced through the terminal ICA and MCA to the level of the occlusion. The micro wire was then carefully advanced through the occluded segment. Microcatheter was then manipulated through the occluded segment and the wire was removed with saline drip at the hub. Blood was then aspirated through the hub of the microcatheter, and a gentle contrast injection was performed confirming  intraluminal position. A rotating hemostatic valve was then attached to the back end of the microcatheter, and a pressurized and heparinized saline bag was attached to the catheter. 4 x 40 solitaire device was again used. Back flush was achieved at the rotating hemostatic valve, and then the device was gently advanced through the microcatheter to the distal end. The retriever was then unsheathed by withdrawing the microcatheter under fluoroscopy. Once the retriever was completely unsheathed, the microcatheter was carefully stripped from the delivery device. Control angiogram was performed from the intermediate catheter, demonstrating that there was no flow through the open solitaire. A 3 minute time interval was observed. The balloon at the balloon guide catheter was then inflated under fluoroscopy for proximal flow arrest. Aspiration engine was turned on. Intermediate catheter was gently advanced over the proximal solitaire to the level of the occlusion, with flow arrest. Constant aspiration using the proprietary engine was then performed at the intermediate catheter, as both the retriever and the intermediate catheter were gently and slowly withdrawn with fluoroscopic observation. Once the retriever was "corked" within the tip of the intermediate catheter, both were removed from the system. Free aspiration was confirmed at the hub of the balloon guide catheter, with free blood return confirmed. The balloon was then deflated, and a control angiogram was performed. Restoration of left MCA flow was confirmed. The initial left A2 segment occlusion was observed to have migrated distally to the pericallosal artery. Given the patient's young age we decided to proceed with attempt at mechanical thrombectomy of this ACA embolus. ACA thrombectomy attempt: The walrus balloon catheter remained in the distal cervical segment, proximal to the horizontal petrous segment. We advanced a coaxial wire and catheter system which  included 014 synchro soft wire, Zoom 35 intermediate catheter, and tree though pro view 18 microcatheter. This was advanced through the cavernous segment to the supraclinoid segment. This catheter and wire combination failed to engage the angulated A1 origin. We then attempted a coaxial system which included CT 5 intermediate catheter, 150cm phenom 21 microcatheter, and synchro soft wire. This coaxial system was advanced through the cavernous segment, supraclinoid segment, and failed to engage the A1 segment. Various micro wires were then used including Aristotle wire, and double angle headliner wire. The 014 headliner wire was ultimately successful engaging the A1 segment, with a pericallosal position achieved. The intermediate  catheter would not make the nearly 180 degree turn into the A1 segment. After several attempts at achieving n/a 1 position with the intermediate catheter, we decided that we could attempt to use an anchoring technique by deploying stent tree verb across the embolism, and then bringing the intermediate catheter over the microcatheter and stent deployed stent tree verb. This would require, however, passing through the embolus with the microcatheter to deployed the stent tree verge. The microcatheter and microwire combination would not pass gently through the thromboembolism within the pericallosal artery, with quite a dense character observed when the wire and microcatheter were at the face of the thrombus. Ultimately we decided to withdraw from this attempt given the difficulty, and the apparent reactivity of the patient's carotid artery as we observed spasm and irregularity at the skull base on each subsequent control angiogram. The microcatheter was withdrawn to the cavernous segment of the ICA. Wire was removed. Exchange length floppy tip transcend wire was then placed through the microcatheter into a safe position. The balloon guide catheter was then withdrawn into the common carotid  artery and angiogram was performed. This revealed irregularity and narrowing in the proximal ICA, which continued with multiple variation in diameter through the cervical ICA to the distal segment at the site of the prior inflated balloon. These findings were discussed with the neurology team, with concern for dissection, as there was irregularity at this segment on the preprocedural CT scan, indicating possible vasculitis/dissection as not only the current imaging diagnosis, but potentially site of the thromboembolism origin. At this point we performed flat panel CT scan to observe for ICH/S AH, as treatment with a stent would require dual anti-platelet therapy. Flat panel CT demonstrated thin subarachnoid hemorrhage in the inter hemispheric fissure. Interval angiogram of the ICA was performed to determine the need for any possible stenting. 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 This imaging finding was concerning for progression of a possible intramural hematoma/dissection and at this point we elected to move forward with stenting. At least 20 minutes were required to place an orogastric tube and then deliver our prescribed dose of aspirin and Plavix. Once these events had occurred, repeat angiogram was performed. Interestingly this image revealed significant improvement at the cervical segment, with residual irregularity (image XA #24). At this point we interpreted this as either improving vaso spasm or improving sequela of vasculitis/dissection. Given the flat panel head CT findings and the desire to avoid dual anti-platelet therapy long-term, we elected to withdraw from the case at this point with no stent deployed. Balloon guide was then removed, with all catheters and wires removed. The skin at the puncture site was then cleaned with Chlorhexidine. The 8 French sheath was removed and an 46F angioseal was deployed. Patient tolerated the  procedure well and remained hemodynamically stable throughout. Blood loss estimated at 150 cc. IMPRESSION: Status post ultrasound guided access right common femoral artery for cervical/cerebral angiogram and mechanical thrombectomy of left MCA restoring complete flow to the left MCA territory, and failed attempt at revascularization of left pericallosal thromboembolism. Flat panel CT at the completion demonstrates thin subarachnoid hemorrhage in the anterior inter hemispheric fissure. Angio-Seal deployed for hemostasis. Signed, Yvone Neu. Reyne Dumas, RPVI [facility] Radiology PLAN: The patient will remain intubated, as she remains COVID status unknown ICU status Target systolic blood pressure of 140-160 Right hip straight time 6 hours Frequent neurovascular checks Repeat neurologic imaging  with CT and/MRI at the discretion of neurology team Electronically Signed   By: Gilmer Mor D.O.   On: 05/21/2020 08:57   CT HEAD CODE STROKE WO CONTRAST  Result Date: 05/19/2020 CLINICAL DATA:  Code stroke.  Stroke suspected. EXAM: CT HEAD WITHOUT CONTRAST TECHNIQUE: Contiguous axial images were obtained from the base of the skull through the vertex without intravenous contrast. COMPARISON:  None. FINDINGS: Brain: No evidence of acute infarction, hemorrhage, hydrocephalus, extra-axial collection or mass lesion/mass effect. Vascular: Dense left ICA terminus and MCA, extensive in compatible with thrombosis in this setting and assuming referable symptoms. CTA is underway. Skull: Negative Sinuses/Orbits: Negative Other: These results were communicated to Dr. Amada Jupiter at 11:25 amon 8/21/2021by text page via the [facility] messaging system. ASPECTS [facility] Stroke Program Early CT Score) - Ganglionic level infarction (caudate, lentiform nuclei, internal capsule, insula, M1-M3 cortex): 7 - Supraganglionic infarction (M4-M6 cortex): 3 Total score (0-10 with 10 being normal): 10  IMPRESSION: Dense left ICA terminus and MCA. No acute hemorrhage. ASPECTS is 10. Electronically Signed   By: Marnee Spring M.D.   On: 05/19/2020 11:27   VAS [place] LOWER EXTREMITY VENOUS (DVT)  Result Date: 05/29/2020  Lower Venous DVTStudy Indications: Fever. SARS-CoV-2 positive, and pulmonary embolism.  Comparison Study: 05/21/2020- negative bilateral lower extremity venous duplex Performing Technologist: Gertie Fey MHA, RDMS, RVT, RDCS  Examination Guidelines: A complete evaluation includes B-mode imaging, spectral Doppler, color Doppler, and power Doppler as needed of all accessible portions of each vessel. Bilateral testing is considered an integral part of a complete examination. Limited examinations for reoccurring indications may be performed as noted. The reflux portion of the exam is performed with the patient in reverse Trendelenburg.  +---------+---------------+---------+-----------+----------+--------------+ RIGHT    CompressibilityPhasicitySpontaneityPropertiesThrombus Aging +---------+---------------+---------+-----------+----------+--------------+ CFV      Full           Yes      Yes                                 +---------+---------------+---------+-----------+----------+--------------+ SFJ      Full                                                        +---------+---------------+---------+-----------+----------+--------------+ FV Prox  None                    No                   Acute          +---------+---------------+---------+-----------+----------+--------------+ FV Mid   None                                         Acute          +---------+---------------+---------+-----------+----------+--------------+ FV DistalNone                                         Acute          +---------+---------------+---------+-----------+----------+--------------+ PFV      Full                                                         +---------+---------------+---------+-----------+----------+--------------+  POP      None                    No                   Acute          +---------+---------------+---------+-----------+----------+--------------+ PTV      Full                    Yes                                 +---------+---------------+---------+-----------+----------+--------------+ PERO     Partial                 Yes                                 +---------+---------------+---------+-----------+----------+--------------+ Gastroc  None                    No                   Acute          +---------+---------------+---------+-----------+----------+--------------+   +---------+---------------+---------+-----------+----------+--------------+ LEFT     CompressibilityPhasicitySpontaneityPropertiesThrombus Aging +---------+---------------+---------+-----------+----------+--------------+ CFV      Full           Yes      Yes                                 +---------+---------------+---------+-----------+----------+--------------+ SFJ      Full                                                        +---------+---------------+---------+-----------+----------+--------------+ FV Prox  Full                                                        +---------+---------------+---------+-----------+----------+--------------+ FV Mid   Full                                                        +---------+---------------+---------+-----------+----------+--------------+ FV DistalFull                                                        +---------+---------------+---------+-----------+----------+--------------+ PFV      Full                                                        +---------+---------------+---------+-----------+----------+--------------+ POP  Full           Yes      Yes                                  +---------+---------------+---------+-----------+----------+--------------+ PTV      Full                                                        +---------+---------------+---------+-----------+----------+--------------+ PERO     Full                                                        +---------+---------------+---------+-----------+----------+--------------+     Summary: RIGHT: - Findings consistent with acute deep vein thrombosis involving the right femoral vein, right popliteal vein, and right gastrocnemius veins. - No cystic structure found in the popliteal fossa.  LEFT: - There is no evidence of deep vein thrombosis in the lower extremity.  - No cystic structure found in the popliteal fossa.  *See table(s) above for measurements and observations. Electronically signed by Coral Else MD on 05/29/2020 at 8:52:33 PM.    Final    VAS [place] LOWER EXTREMITY VENOUS (DVT)  Result Date: 05/21/2020  Lower Venous DVTStudy Indications: Stroke.  Limitations: Bandages. Comparison Study: No prior studies. Performing Technologist: Jean Rosenthal  Examination Guidelines: A complete evaluation includes B-mode imaging, spectral Doppler, color Doppler, and power Doppler as needed of all accessible portions of each vessel. Bilateral testing is considered an integral part of a complete examination. Limited examinations for reoccurring indications may be performed as noted. The reflux portion of the exam is performed with the patient in reverse Trendelenburg.  +---------+---------------+---------+-----------+----------+-------------------+ RIGHT    CompressibilityPhasicitySpontaneityPropertiesThrombus Aging      +---------+---------------+---------+-----------+----------+-------------------+ CFV                                                   Unable to visualize                                                       due to bandages      +---------+---------------+---------+-----------+----------+-------------------+ SFJ                                                   Unable to visualize                                                       due to bandages     +---------+---------------+---------+-----------+----------+-------------------+ FV Prox  Full                                                             +---------+---------------+---------+-----------+----------+-------------------+ FV Mid   Full                                                             +---------+---------------+---------+-----------+----------+-------------------+ FV DistalFull                                                             +---------+---------------+---------+-----------+----------+-------------------+ PFV      Full                                                             +---------+---------------+---------+-----------+----------+-------------------+ POP      Full           Yes      Yes                                      +---------+---------------+---------+-----------+----------+-------------------+ PTV      Full                                                             +---------+---------------+---------+-----------+----------+-------------------+ PERO     Full                                                             +---------+---------------+---------+-----------+----------+-------------------+   +---------+---------------+---------+-----------+----------+-------------------+ LEFT     CompressibilityPhasicitySpontaneityPropertiesThrombus Aging      +---------+---------------+---------+-----------+----------+-------------------+ CFV      Full           Yes      Yes                                      +---------+---------------+---------+-----------+----------+-------------------+ SFJ      Full                                                              +---------+---------------+---------+-----------+----------+-------------------+  FV Prox  Full                                                             +---------+---------------+---------+-----------+----------+-------------------+ FV Mid   Full           Yes      Yes                  Some segments not                                                         visualized due to                                                         bandages            +---------+---------------+---------+-----------+----------+-------------------+ FV DistalFull           Yes      Yes                                      +---------+---------------+---------+-----------+----------+-------------------+ PFV      Full                                                             +---------+---------------+---------+-----------+----------+-------------------+ POP      Full           Yes      Yes                                      +---------+---------------+---------+-----------+----------+-------------------+ PTV      Full                                                             +---------+---------------+---------+-----------+----------+-------------------+ PERO     Full                                                             +---------+---------------+---------+-----------+----------+-------------------+    Summary: RIGHT: - There is no evidence of deep vein thrombosis in the lower extremity. However, portions of this examination were limited- see technologist comments above.  - No cystic structure found in the popliteal fossa.  LEFT: - There  is no evidence of deep vein thrombosis in the lower extremity. However, portions of this examination were limited- see technologist comments above.  - No cystic structure found in the popliteal fossa.  *See table(s) above for measurements and observations. Electronically signed by Fabienne Bruns MD on 05/21/2020 at 5:11:44 PM.     Final    ECHOCARDIOGRAM LIMITED  Result Date: 05/29/2020    ECHOCARDIOGRAM LIMITED REPORT   Patient Name:   Gail Montgomery Date of Exam: 05/29/2020 Medical Rec #:  [id number]    Height:       64.0 in Accession #:    [id number]   Weight:       187.6 lb Date of Birth:  1996/05/31    BSA:          1.904 m Patient Age:    24 years     BP:           119/77 mmHg Patient Gender: F            HR:           100 bpm. Exam Location:  Inpatient Procedure: Limited Echo Indications:    pulmonary embolism  History:        Patient has prior history of Echocardiogram examinations, most                 recent 05/20/2020. Stroke; Risk Factors:Current Smoker.  Sonographer:    Celene Skeen RDCS (AE) Referring Phys: [phone] A CALDWELL POWELL JR  Sonographer Comments: poor patient compliance (see comments). restricted mobility IMPRESSIONS  1. Left ventricular ejection fraction, by estimation, is 60 to 65%. The left ventricle has normal function. The left ventricle has no regional wall motion abnormalities.  2. Right ventricular systolic function is normal. The right ventricular size is normal. Mildly D-shaped septum suggestive of RV pressure/volume overload.  3. The aortic valve is tricuspid. Aortic valve regurgitation is not visualized. No aortic stenosis is present.  4. The mitral valve is normal in structure. No evidence of mitral valve regurgitation. No evidence of mitral stenosis.  5. The inferior vena cava is normal in size with greater than 50% respiratory variability, suggesting right atrial pressure of 3 mmHg.  6. Limited echo. FINDINGS  Left Ventricle: Left ventricular ejection fraction, by estimation, is 60 to 65%. The left ventricle has normal function. The left ventricle has no regional wall motion abnormalities. The left ventricular internal cavity size was normal in size. There is  no left ventricular hypertrophy. Right Ventricle: The right ventricular size is normal. No increase in right ventricular wall thickness. Right  ventricular systolic function is normal. Left Atrium: Left atrial size was normal in size. Right Atrium: Right atrial size was normal in size. Pericardium: Trivial pericardial effusion is present. Mitral Valve: The mitral valve is normal in structure. No evidence of mitral valve stenosis. Aortic Valve: The aortic valve is tricuspid. Aortic valve regurgitation is not visualized. No aortic stenosis is present. Pulmonic Valve: The pulmonic valve was normal in structure. Pulmonic valve regurgitation is not visualized. Venous: The inferior vena cava is normal in size with greater than 50% respiratory variability, suggesting right atrial pressure of 3 mmHg. IAS/Shunts: No atrial level shunt detected by color flow Doppler. Marca Ancona MD Electronically signed by Marca Ancona MD Signature Date/Time: 05/29/2020/2:55:34 PM    Final    [place] EKG SITE RITE  Result Date: 05/21/2020 If [facility] image not attached, placement could not be confirmed due to current cardiac rhythm.  Subjective: No new complaints.   Discharge Exam: Vitals:   06/06/20 2234 06/07/20 0741  BP: 129/77 110/76  Pulse: (!) 118 (!) 104  Resp: 16 20  Temp: (!) 100.4 F (38 C) 98.9 F (37.2 C)  SpO2: 98% 99%   Vitals:   06/06/20 1407 06/06/20 1543 06/06/20 2234 06/07/20 0741  BP: (!) 118/98 117/71 129/77 110/76  Pulse: 100 (!) 109 (!) 118 (!) 104  Resp:   16 20  Temp: 98.2 F (36.8 C) 100 F (37.8 C) (!) 100.4 F (38 C) 98.9 F (37.2 C)  TempSrc:   Oral   SpO2: 98% 100% 98% 99%  Weight:      Height:        General: Pt is alert, awake, not in acute distress Cardiovascular: RRR, S1/S2 +, no rubs, no gallops Respiratory: CTA bilaterally, no wheezing, no rhonchi Abdominal: Soft, NT, ND, bowel sounds + Extremities: no edema, no cyanosis    The results of significant diagnostics from this hospitalization (including imaging, microbiology, ancillary and laboratory) are listed below for reference.      Microbiology: Recent Results (from the past 240 hour(s))  Culture, Urine     Status: Abnormal   Collection Time: 05/28/20  1:51 PM   Specimen: Urine, Random  Result Value Ref Range Status   Specimen Description URINE, RANDOM  Final   Special Requests   Final    NONE Performed at [facility], [address] [place], [place] [address]    Culture >=100,000 COLONIES/mL ESCHERICHIA COLI (A)  Final   Report Status 05/30/2020 FINAL  Final   Organism ID, Bacteria ESCHERICHIA COLI (A)  Final      Susceptibility   Escherichia coli - MIC*    AMPICILLIN >=32 RESISTANT Resistant     CEFAZOLIN 16 SENSITIVE Sensitive     CEFTRIAXONE <=0.25 SENSITIVE Sensitive     CIPROFLOXACIN <=0.25 SENSITIVE Sensitive     GENTAMICIN >=16 RESISTANT Resistant     IMIPENEM <=0.25 SENSITIVE Sensitive     NITROFURANTOIN <=16 SENSITIVE Sensitive     TRIMETH/SULFA <=20 SENSITIVE Sensitive     AMPICILLIN/SULBACTAM >=32 RESISTANT Resistant     PIP/TAZO <=4 SENSITIVE Sensitive     * >=100,000 COLONIES/mL ESCHERICHIA COLI  Culture, blood (routine x 2)     Status: None (Preliminary result)   Collection Time: 06/03/20  3:30 AM   Specimen: BLOOD  Result Value Ref Range Status   Specimen Description BLOOD LEFT ANTECUBITAL  Final   Special Requests   Final    BOTTLES DRAWN AEROBIC AND ANAEROBIC Blood Culture adequate volume   Culture   Final    NO GROWTH 3 DAYS Performed at [facility], [address] [place], [place] [address]    Report Status PENDING  Incomplete  Culture, blood (routine x 2)     Status: None (Preliminary result)   Collection Time: 06/03/20  3:35 AM   Specimen: BLOOD  Result Value Ref Range Status   Specimen Description BLOOD RIGHT ANTECUBITAL  Final   Special Requests   Final    BOTTLES DRAWN AEROBIC AND ANAEROBIC Blood Culture adequate volume   Culture   Final    NO GROWTH 3 DAYS Performed at [facility], [address], [place], [place] [address]    Report Status  PENDING  Incomplete     Labs: BNP (last 3 results) Recent Labs    06/02/20 0101 06/03/20 0326 06/04/20 0114  BNP 27.5 27.3 21.4  Basic Metabolic Panel: Recent Labs  Lab 05/31/20 0828 05/31/20 0828 06/01/20 0325 06/01/20 0325 06/02/20 0101 06/03/20 0326 06/04/20 0114 06/05/20 0514 06/06/20 0135  NA 129*   < > 131*   < > 126* 127* 132* 130* 129*  K 3.7   < > 4.3   < > 3.5 4.1 4.3 4.3 4.2  CL 99   < > 97*   < > 96* 95* 99 98 97*  CO2 19*   < > 21*   < > 19* 21* 22 22 24   GLUCOSE 102*   < > 97   < > 119* 108* 107* 98 103*  BUN 9   < > 7   < > 7 <5* <5* <5* <5*  CREATININE 0.51   < > 0.58   < > 0.54 0.50 0.56 0.62 0.51  CALCIUM 8.4*   < > 8.8*   < > 8.6* 8.9 8.8* 8.8* 8.6*  MG 2.0  --  2.0  --  1.8 2.0 1.9  --   --    < > = values in this interval not displayed.   Liver Function Tests: Recent Labs  Lab 06/01/20 0325 06/02/20 0101 06/03/20 0326 06/04/20 0114 06/05/20 0514  AST 83* 77* 96* 64* 47*  ALT 90* 92* 115* 102* 71*  ALKPHOS 46 45 52 52 48  BILITOT 0.4 0.3 0.4 0.5 0.4  PROT 7.6 7.2 7.7 7.8 7.8  ALBUMIN 2.2* 2.1* 2.2* 2.1* 2.2*   No results for input(s): LIPASE, AMYLASE in the last 168 hours. No results for input(s): AMMONIA in the last 168 hours. CBC: Recent Labs  Lab 06/02/20 0101 06/03/20 0326 06/04/20 0114 06/05/20 0514 06/06/20 0135  WBC 8.3 8.2 7.2 6.6 6.2  HGB 8.9* 9.4* 9.7* 9.8* 9.3*  HCT 27.4* 29.1* 29.5* 31.0* 29.1*  MCV 86.7 87.1 88.1 90.6 89.0  PLT 485* 533* 547* 514* 503*   Cardiac Enzymes: No results for input(s): CKTOTAL, CKMB, CKMBINDEX, TROPONINI in the last 168 hours. BNP: Invalid input(s): POCBNP CBG: Recent Labs  Lab 05/31/20 1944 05/31/20 2324 06/01/20 0353  GLUCAP 100* 111* 90   D-Dimer No results for input(s): DDIMER in the last 72 hours. Hgb A1c No results for input(s): HGBA1C in the last 72 hours. Lipid Profile No results for input(s): CHOL, HDL, LDLCALC, TRIG, CHOLHDL, LDLDIRECT in the last 72  hours. Thyroid function studies No results for input(s): TSH, T4TOTAL, T3FREE, THYROIDAB in the last 72 hours.  Invalid input(s): FREET3 Anemia work up No results for input(s): VITAMINB12, FOLATE, FERRITIN, TIBC, IRON, RETICCTPCT in the last 72 hours. Urinalysis    Component Value Date/Time   COLORURINE AMBER (A) 05/31/2020 1044   APPEARANCEUR CLOUDY (A) 05/31/2020 1044   LABSPEC 1.039 (H) 05/31/2020 1044   PHURINE 5.0 05/31/2020 1044   GLUCOSEU NEGATIVE 05/31/2020 1044   HGBUR NEGATIVE 05/31/2020 1044   BILIRUBINUR NEGATIVE 05/31/2020 1044   KETONESUR 5 (A) 05/31/2020 1044   PROTEINUR 30 (A) 05/31/2020 1044   NITRITE NEGATIVE 05/31/2020 1044   LEUKOCYTESUR SMALL (A) 05/31/2020 1044   Sepsis Labs Invalid input(s): PROCALCITONIN,  WBC,  LACTICIDVEN Microbiology Recent Results (from the past 240 hour(s))  Culture, Urine     Status: Abnormal   Collection Time: 05/28/20  1:51 PM   Specimen: Urine, Random  Result Value Ref Range Status   Specimen Description URINE, RANDOM  Final   Special Requests   Final    NONE Performed at [facility], [address], [place], [place]  [address]    Culture >=100,000 COLONIES/mL ESCHERICHIA COLI (A)  Final   Report Status 05/30/2020 FINAL  Final   Organism ID, Bacteria ESCHERICHIA COLI (A)  Final      Susceptibility   Escherichia coli - MIC*    AMPICILLIN >=32 RESISTANT Resistant     CEFAZOLIN 16 SENSITIVE Sensitive     CEFTRIAXONE <=0.25 SENSITIVE Sensitive     CIPROFLOXACIN <=0.25 SENSITIVE Sensitive     GENTAMICIN >=16 RESISTANT Resistant     IMIPENEM <=0.25 SENSITIVE Sensitive     NITROFURANTOIN <=16 SENSITIVE Sensitive     TRIMETH/SULFA <=20 SENSITIVE Sensitive     AMPICILLIN/SULBACTAM >=32 RESISTANT Resistant     PIP/TAZO <=4 SENSITIVE Sensitive     * >=100,000 COLONIES/mL ESCHERICHIA COLI  Culture, blood (routine x 2)     Status: None (Preliminary result)   Collection Time: 06/03/20  3:30 AM   Specimen: BLOOD  Result  Value Ref Range Status   Specimen Description BLOOD LEFT ANTECUBITAL  Final   Special Requests   Final    BOTTLES DRAWN AEROBIC AND ANAEROBIC Blood Culture adequate volume   Culture   Final    NO GROWTH 3 DAYS Performed at [facility], [address], [place], [place] [address]    Report Status PENDING  Incomplete  Culture, blood (routine x 2)     Status: None (Preliminary result)   Collection Time: 06/03/20  3:35 AM   Specimen: BLOOD  Result Value Ref Range Status   Specimen Description BLOOD RIGHT ANTECUBITAL  Final   Special Requests   Final    BOTTLES DRAWN AEROBIC AND ANAEROBIC Blood Culture adequate volume   Culture   Final    NO GROWTH 3 DAYS Performed at [facility], [address] [place], [place] [address]    Report Status PENDING  Incomplete     Time coordinating discharge: 35 min  SIGNED:   Kathlen Mody, MD  [facility] 06/07/2020, 8:25 AM

## 2020-06-07 NOTE — Progress Notes (Signed)
Inpatient Rehabilitation Medication Review by a Pharmacist  A complete drug regimen review was completed for this patient to identify any potential clinically significant medication issues.  Clinically significant medication issues were identified:  no  Check AMION for pharmacist assigned to patient if future medication questions/issues arise during this admission.  Pharmacist comments:   Time spent performing this drug regimen review (minutes):  10 minutes   Nadara Mustard, PharmD., MS Clinical Pharmacist  Thank you for allowing pharmacy to be part of this patients care team.  06/07/2020 6:11 PM

## 2020-06-07 NOTE — Progress Notes (Signed)
Courtney Heys, MD  Physician  Physical Medicine and Rehabilitation  PMR Pre-admission     Signed  Date of Service:  06/02/2020 2:46 PM      Related encounter: ED to Hosp-Admission (Current) from 05/19/2020 in [place] [facility] Progressive Care      Signed       Show:Clear all _0 Manual_1 Template_2 Copied  Added by: _3 Retta Diones, RN_4 Lovorn, Megan, MD_5 Genella Mech, CCC-SLP  _6 Hover for details PMR Admission Coordinator Pre-Admission Assessment  Patient: Gail Montgomery is an 24 y.o., female MRN: [id number] DOB: November 16, 1995 Height: _7  (162.6 cm) Weight: 85.1 kg                                                                                                                                                  Insurance Information HMO:     PPO: Yes     PCP:       IPA:       80/20:       OTHER:  Group [id number] PRIMARY: [place]      Policy#: [id number]      Subscriber: patient CM Name: Levy Pupa      Phone#: [phone]     Fax#: [phone] Pre-Cert#: [id number] for 2 weeks      Employer: [employer] Benefits:  Phone #: [phone]     Name:   Eff. Date: 09/30/19     Deduct: $1500 (met $852.53)      Out of Pocket Max: $5900 (met met $864.06)      Life Max: N/A  CIR: $337/admission and the 70% coverage, 30% co-insurance      SNF: 70% with 100 days max Outpatient: medical necessity     Co-Pay: $36 to $72 copay per visit depending on provider type Home Health: 70%      Co-Pay: 30% DME: 70%     Co-Pay: 30% Providers: in network  SECONDARY:       Policy#:       Phone#:   Development worker, community:       Phone#:   The Engineer, petroleum" for patients in Inpatient Rehabilitation Facilities with attached "Privacy Act [place] Records" was provided and verbally reviewed with: N/A  Emergency Contact Information         Contact Information    Name Relation Home Work Mobile   [place] Mother [phone]   [phone]   Hickey,Kevric Father   [phone]     Current Medical History  Patient Admitting Diagnosis: Stroke   History of Present Illness: Pt. Is a 24 year old female with no significant PMH  Presented to ED 05/19/20 with sudden  right hemiparesis, and aphasia.Code stroke called and pt. 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 She received tPA at 11:29 am ,  was deemed  a candidate for thrombectomy, and was taken to IR. In IR she had successful opening of her LMCA with reperfusion, but LACA has a residual small distal anterior cerebral clot. During procedure there wasICA vasospasm vs dissection. She was loaded with ASA and Plavix. There was concern that she would need carotid stenting . However upon re-imaging the vessel was open, and she did not require stent.SAH, Parenchymal hemorrhage, and intraventricular hemorrhage.  During this admission she has had persistent, intermittent fevers since 8/24. Also has had sinus tachycardia, from 110s to 130s, occasionally 140s 150s. Tachycardia  seems to correlate with the severity of fever mostly, she was breifly given Vanc and Zosyn in ICU on 8/25- 8/27. 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  Patient was transitioned to Eliquis on 06/01/2020. She underwent TEE 06/06/20 showing PFO. She will be follow-up with cardiology as an outpatient for PFO closure.  PT/OT/SLP evaluations were completed with recommendations for an inpatient rehab admission.  She is to be admitted to an inpatient rehab program today.  Complete NIHSS TOTAL: 8 Glasgow Coma Scale Score: 15  Past Medical History      Past Medical History:  Diagnosis Date  . Anxiety   . Panic attack     Family History  family history is not on file.  Prior Rehab/Hospitalizations:  Has the patient had prior rehab or  hospitalizations prior to admission? No  Has the patient had major surgery during 100 days prior to admission? Yes  Current Medications   Current Facility-Administered Medications:  .  acetaminophen (TYLENOL) tablet 500 mg, 500 mg, Oral, Q6H PRN, 500 mg at 06/06/20 2235 **OR** [DISCONTINUED] acetaminophen (TYLENOL) 160 MG/5ML solution 650 mg, 650 mg, Per Tube, Q4H PRN, 650 mg at 05/29/20 2343 **OR** [DISCONTINUED] acetaminophen (TYLENOL) suppository 650 mg, 650 mg, Rectal, Q4H PRN, Elodia Florence., MD, 650 mg at 05/29/20 1238 .  apixaban (ELIQUIS) tablet 5 mg, 5 mg, Oral, BID, Thurnell Lose, MD, 5 mg at 06/07/20 0900 .  atorvastatin (LIPITOR) tablet 20 mg, 20 mg, Oral, Daily, Rosalin Hawking, MD, 20 mg at 06/07/20 0900 .  Chlorhexidine Gluconate Cloth 2 % PADS 6 each, 6 each, Topical, Daily, Elodia Florence., MD, 6 each at 06/07/20 [phone] .  docusate (COLACE) 50 MG/5ML liquid 100 mg, 100 mg, Per Tube, Daily PRN, Elodia Florence., MD .  folic acid (FOLVITE) tablet 1 mg, 1 mg, Oral, Daily, Elodia Florence., MD, 1 mg at 06/07/20 0900 .  loperamide (IMODIUM) capsule 4 mg, 4 mg, Oral, Q6H PRN, Thurnell Lose, MD, 4 mg at 06/03/20 1605 .  MEDLINE mouth rinse, 15 mL, Mouth Rinse, BID, Elodia Florence., MD, 15 mL at 06/06/20 2041 .  multivitamin (PROSIGHT) tablet 1 tablet, 1 tablet, Oral, Daily, Elodia Florence., MD, 1 tablet at 06/07/20 [phone] .  niMODipine (NYMALIZE) 6 MG/ML oral solution 60 mg, 60 mg, Oral, Q4H, Garvin Fila, MD, 60 mg at 06/07/20 0901 .  pantoprazole (PROTONIX) EC tablet 40 mg, 40 mg, Oral, Daily, Elodia Florence., MD, 40 mg at 06/07/20 0859 .  polyethylene glycol (MIRALAX / GLYCOLAX) packet 17 g, 17 g, Per Tube, Daily PRN, Elodia Florence., MD .  sodium chloride flush (NS) 0.9 % injection 10-40 mL, 10-40 mL, Intracatheter, PRN, Elodia Florence., MD .  thiamine tablet 100 mg, 100 mg, Oral, Daily, Elodia Florence., MD,  100 mg at 06/07/20 0859 .  vitamin B-12 (CYANOCOBALAMIN) tablet 500 mcg, 500 mcg, Oral, Daily, Elodia Florence., MD, 500 mcg at 06/07/20 0900  Patients Current Diet:     Diet Order                  Diet - low sodium heart healthy            Diet regular Room service appropriate? Yes; Fluid consistency: Thin  Diet effective now                  Precautions / Restrictions Precautions Precautions: Fall Precaution Comments: right sided weakness (R leg most significantly) Restrictions Weight Bearing Restrictions: No   Has the patient had 2 or more falls or a fall with injury in the past year?No  Prior Activity Level Community (5-7x/wk): pt. 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  Independent  Indoor Mobility: Did the patient need assistance with walking from room to room (with or without device)? Independent  Stairs: Did the patient need assistance with internal or external stairs (with or without device)? Independent  Functional Cognition: Did the patient need help planning regular tasks such as shopping or remembering to take medications? Independent  Home Assistive Devices / Equipment Home Equipment: None  Prior Device Use: Indicate devices/aids used by the patient prior to current illness, exacerbation or injury? None of the above  Current Functional Level Cognition  Arousal/Alertness: Awake/alert Overall Cognitive Status: Difficult to assess Difficult to assess due to: Impaired communication Current Attention Level: Selective Orientation Level: Oriented X4 Following Commands: Follows one step commands with increased time Safety/Judgement: Decreased awareness of  safety General Comments: Pt spoke to PT today, stating "I was worried by nails were digging into you (during gait)", and states "my knee and hip" when PT asks where her pain is. Pt continuing to require short, multimodal cuing for mobility tasks Attention: Sustained Sustained Attention: Impaired Sustained Attention Impairment: Verbal basic    Extremity Assessment (includes Sensation/Coordination)  Upper Extremity Assessment: RUE deficits/detail RUE Deficits / Details: using functionally during ADL however will tend to use LUE at times; will use/attmept to use RUE when cued; decreased coordination and iin-hand manipulation skills noted RUE Sensation: decreased proprioception RUE Coordination: decreased fine motor, decreased gross motor  Lower Extremity Assessment: Defer to PT evaluation RLE Deficits / Details: right leg with significant weakness.  I did not elicit or observe any active movement on this side.     ADLs  Overall ADL's : Needs assistance/impaired Eating/Feeding: Set up, Sitting Grooming: Minimal assistance, Cueing for sequencing, Sitting, Standing Grooming Details (indicate cue type and reason): partial session completed in standing Upper Body Bathing: Minimal assistance, Sitting Lower Body Bathing: Maximal assistance, Bed level Upper Body Dressing : Maximal assistance Lower Body Dressing: Maximal assistance Lower Body Dressing Details (indicate cue type and reason): Able to reach forward to help donn socks in seated osition Toilet Transfer: Moderate assistance, +2 for physical assistance, Stand-pivot, Ambulation Toilet Transfer Details (indicate cue type and reason): Mod A +2 to power up into standing with Max cues for hand placement when using RW. Pt requiring Max A +2 for  pivot. Requiring Max A to block R knee Toileting- Clothing Manipulation and Hygiene: Maximal assistance Toileting - Clothing Manipulation Details (indicate cue type and reason): Pt incontinent of loose  stool.  Stood to assist her with peri care - required total A  Functional mobility during ADLs: Maximal assistance, +2 for physical assistance (ambulation) General ADL Comments: Grooming task completed in front of mirror at sink inboth sit/stand opsition. Pt tendingto lean/propr on L arm when brushing teeth using R hand. Ablet o maintain upright midline posture with mod A at times during activity with tactile cues for extension    Mobility  Overal bed mobility: Needs Assistance Bed Mobility: Supine to Sit Rolling: Max assist Sidelying to sit: Max assist, +2 for physical assistance, +2 for safety/equipment Supine to sit: Min assist, HOB elevated Sit to supine: Mod assist General bed mobility comments: Min assist for supine>sit for RLE management, Mod assist for return to supine for RLE lifting into bed and trunk lowering. Assist for boost up in bed with use of bed pads.    Transfers  Overall transfer level: Needs assistance Equipment used: 2 person hand held assist, 1 person hand held assist Transfers: Sit to/from Stand, Stand Pivot Transfers Sit to Stand: Mod assist, +2 physical assistance Stand pivot transfers: Mod assist General transfer comment: MOd +2 for sit to stand in preparation to ambulate, with bilateral UEs draped on PT and PT aide shoulders. Assist for RLE blocking, rise, and steady. Mod assist for stand pivot to recliner for RLE blocking, steadying, pivotal step to [facility] towards pt R.    Ambulation / Gait / Stairs / Wheelchair Mobility  Ambulation/Gait Ambulation/Gait assistance: Mod assist, +2 physical assistance Gait Distance (Feet): 10 Feet (2x10 ft) Assistive device: 2 person hand held assist Gait Pattern/deviations: Step-to pattern, Decreased step length - right, Decreased dorsiflexion - right, Trunk flexed, Decreased stance time - right, Narrow base of support General Gait Details: Mod +2 for steadying, blocking RLE during stance phase and facilitating RLE during  swing phase of gait, intermittent blocking LLE for buckling during stance phase, and guiding pt to desination surface. verbal cuing for sequencing, responds best to short cues "step right, wait for me, step left" Gait velocity: decr    Posture / Balance Dynamic Sitting Balance Sitting balance - Comments: able to sit EOB without PT support Balance Overall balance assessment: Needs assistance Sitting-balance support: Feet supported, Single extremity supported Sitting balance-Leahy Scale: Fair Sitting balance - Comments: able to sit EOB without PT support Postural control: Posterior lean, Right lateral lean Standing balance support: During functional activity, Single extremity supported Standing balance-Leahy Scale: Poor Standing balance comment: reliant on external assist    Special needs/care consideration  Designated visitor Mom    Previous Home Environment (from acute therapy documentation) Living Arrangements: Alone (has a boyfriend) Available Help at Discharge: Family, Available 24 hours/day (mom plans to take her to mom's home in [place]) Type of Home: House Home Layout: Multi-level, Able to live on main level with bedroom/bathroom Alternate Level Stairs-Number of Steps: 2 Home Access: Level entry Bathroom Shower/Tub: Multimedia programmer: Standard Additional Comments: plan is for pt to live with Mom after DC  Discharge Living Setting Plans for Discharge Living Setting: House (Parent's home) Type of Home at Discharge: House Discharge Home Layout: Two level, Able to live on main level with bedroom/bathroom Discharge Home Access: Level entry Discharge Bathroom Shower/Tub: Walk-in shower Discharge Bathroom Toilet: Standard Discharge Bathroom Accessibility: Yes How Accessible: Accessible via wheelchair, Accessible via walker  Does the patient have any problems obtaining your medications?: No  Social/Family/Support Systems Patient Roles: Parent Contact  Information: [phone] Anticipated Caregiver: Brynlie Daza (mother) confirmed that she and Pt.'s father can provide 24/7 support  Anticipated Caregiver's Contact Information: [phone] Ability/Limitations of Caregiver: none  Caregiver Availability: 24/7 Discharge Plan Discussed with Primary Caregiver: Yes Is Caregiver In Agreement with Plan?: Yes   Goals Patient/Family Goal for Rehab: PT/OT min A, SLP Mod A (12-14 days) Expected length of stay: 14-17 days Pt/Family Agrees to Admission and willing to participate: Yes Program Orientation Provided & Reviewed with Pt/Caregiver Including Roles  & Responsibilities: Yes   Decrease burden of Care through IP rehab admission: N/A  Possible need for SNF placement upon discharge: Not anticipated   Patient Condition: I have reviewed medical records from [facility], spoken with CM, and family member. I discussed via phone for inpatient rehabilitation assessment.  Patient will benefit from ongoing PT, OT and SLP, can actively participate in 3 hours of therapy a day 5 days of the week, and can make measurable gains during the admission.  Patient will also benefit from the coordinated team approach during an Inpatient Acute Rehabilitation admission.  The patient will receive intensive therapy as well as Rehabilitation physician, nursing, social worker, and care management interventions.  Due to bladder management, bowel management, safety, skin/wound care, disease management, medication administration, pain management and patient education the patient requires 24 hour a day rehabilitation nursing.  The patient is currently min to mod Assist with mobility and basic ADLs.  Discharge setting and therapy post discharge at home with home health is anticipated.  Patient has agreed to participate in the Acute Inpatient Rehabilitation Program and will admit today.  Preadmission Screen Completed By:  Retta Diones, RN, 06/07/2020 10:04  AM ______________________________________________________________________   Discussed status with Dr. Alveta Heimlich and Dr. Naaman Plummer on 06/07/20 at 0930 and received approval for admission today.  Admission Coordinator:  Retta Diones, time 1003/Date 06/07/20   Assessment/Plan: Diagnosis: 1. Does the need for close, 24 hr/day Medical supervision in concert with the patient's rehab needs make it unreasonable for this patient to be served in a less intensive setting? Yes 2. 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 Due to bladder management, bowel management, safety, skin/wound care, disease management, medication administration, pain management and patient education, does the patient require 24 hr/day rehab nursing? Yes 4. Does the patient require coordinated care of a physician, rehab nurse, PT, OT, and SLP to address physical and functional deficits in the context of the above medical diagnosis(es)? Yes Addressing deficits in the following areas: balance, endurance, locomotion, strength, transferring, bathing, dressing, feeding, grooming, toileting, cognition, speech and language 5. Can the patient actively participate in an intensive therapy program of at least 3 hrs of therapy 5 days a week? Yes 6. The potential for patient to make measurable gains while on inpatient rehab is excellent 7. Anticipated functional outcomes upon discharge from inpatient rehab: min assist PT, supervision, min assist and mod assist OT, supervision and min assist SLP 8. Estimated rehab length of stay to reach the above functional goals is: 12-14 days 9. Anticipated discharge destination: Home 10. Overall Rehab/Functional Prognosis: excellent   MD Signature:          Revision History  Note Details  Jan Fireman, MD  File Time 06/07/2020 10:18 AM  Author Type Physician Status Signed  Last Editor Courtney Heys, MD Service Physical Medicine and Rehabilitation

## 2020-06-07 NOTE — Anesthesia Postprocedure Evaluation (Signed)
Anesthesia Post Note  Patient: Gail Montgomery  Procedure(s) Performed: TRANSESOPHAGEAL ECHOCARDIOGRAM (TEE) (N/A ) BUBBLE STUDY     Patient location during evaluation: PACU Anesthesia Type: MAC Level of consciousness: awake and alert Pain management: pain level controlled Vital Signs Assessment: post-procedure vital signs reviewed and stable Respiratory status: spontaneous breathing Cardiovascular status: stable Anesthetic complications: no   No complications documented.  Last Vitals:  Vitals:   06/06/20 1543 06/06/20 2234  BP: 117/71 129/77  Pulse: (!) 109 (!) 118  Resp:  16  Temp: 37.8 C (!) 38 C  SpO2: 100% 98%    Last Pain:  Vitals:   06/06/20 2335  TempSrc:   PainSc: 0-No pain                 Nolon Nations

## 2020-06-07 NOTE — Progress Notes (Signed)
Patient arrival to MW11-01 via bed from 2W.  Alert and oriented with noted delayed response.  Right side deficits apparent with assistance to unit bed.  Vital signs stable.  Patient and mother oriented to rehab unit and schedule.   Kimla Furth B. Dudley Major, MSN, RN, CNL

## 2020-06-07 NOTE — Progress Notes (Signed)
IP rehab admissions - I have a bed available on CIR today and will admit today.  I have medical clearance for CIR admission.  Call me for questions.  [phone]

## 2020-06-07 NOTE — Progress Notes (Signed)
Speech Language Pathology Treatment: Dysphagia (Aphasia)  Patient Details Name: Gail Montgomery MRN: [id number] DOB: 21-Feb-1996 Today's Date: 06/07/2020 Time: 2620-3559 SLP Time Calculation (min) (ACUTE ONLY): 58 min  Assessment / Plan / Recommendation Clinical Impression  Pt was seen for treatment with her mother present for part of the session.  Pt and her mother reported that the pt has been tolerating the current diet without overt s/sx of aspiration. Pt tolerated regular texture solids, and thin liquids via straw using consecutive swallows without symptoms of oropharyngeal dysphagia. Further intervention is not needed for swallowing at this time.  Pt's mother reported that the pt was able to produce short phrases when conversing with her father and sister. Per the mother, communication appears to be easier when the pt is less stressed, is concerned, or is excited.Pt demonstrated 100% accuracy with confrontational naming and achieved 80% accuracy with auditory comprehension (response to open-ended questions) of recorded voicemails increasing to 100% with choice cues. She provided 8-12 items per category during divergent naming tasks with intermittent cues for use of sub categories. Pt initially did not communicate verbally for many minutes of the session and communicated by shaking her head or nodding. However, humor appeared to increase the pt's comfort and she participated in conversation with the SLP regarding various topics such as baking, cooking, school, and her various therapies. She appeared to have intermittent difficulty with word retrieval/motor planning and pursed her lips/smiled during these moments. A mild physical struggle was noted during some of these episodes with increased labial tension, raising some suspicion for motor planning involvement.   Pt's independently produced sentences included, but were not limited to, "I'm a fashion major", "I'm technically like a softmore/junior",  "It's not like that, I don't like to talk about it.", "I like my job.", "I like Alexa.", "I saw her yesterday.", and "Some fried chicken and mashed potatoes." Pt's responses were appropriate to the questions asked and she benefited from additional processing time for sentence formulation. Pt's mother was educated regarding strategies to facilitate communication with the pt and to reduce the pt's frustration. She verbalized understanding as well as agreement and all her questions were answered. There were some instances in which pt appeared unwilling to respond to questions/communicate as opposed to her being incapable and pt's mother indicated that she has questioned her willingness. When indirectly asked about this, pt expressed that she sometimes chooses not to communicating verbally due to fear of her not being able to produce speech accurately. Pt currently has discharge orders and would benefit from further skilled SLP services.    HPI HPI: Pt is a 24 yo female presenting with sudden onset R sided weakness and aphasia. 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 She was intubated for thrombectomy 8/21. 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 MRI showed early subacute infarct of the left basal ganglia involving the posterior putamen and caudate body. Pt self-extubated 8/22. Pt tested (+) for COVID-19 but was asymptomatic upon arrival. 8/30 DVT. PMH: panic attack, anxiety      SLP Plan  Continue with current plan of care       Recommendations  Diet recommendations: Regular;Thin liquid Liquids provided via: Cup;Straw Medication Administration: Whole meds with liquid Supervision: Patient able to self feed;Intermittent supervision to cue for compensatory strategies Compensations: Slow rate;Small sips/bites Postural Changes and/or Swallow Maneuvers: Seated upright 90 degrees                Oral Care  Recommendations: Oral care BID Follow up Recommendations:  Inpatient Rehab SLP Visit Diagnosis: Aphasia (R47.01);Dysphagia, unspecified (R13.10) Plan: Continue with current plan of care       Victorious Cosio I. Vear Clock, MS, CCC-SLP Acute Rehabilitation Services Office number [phone] Pager [phone]                Scheryl Marten 06/07/2020, 3:44 PM

## 2020-06-08 ENCOUNTER — Other Ambulatory Visit: Payer: Self-pay

## 2020-06-08 ENCOUNTER — Inpatient Hospital Stay (HOSPITAL_COMMUNITY): Payer: BC Managed Care – PPO

## 2020-06-08 ENCOUNTER — Inpatient Hospital Stay (HOSPITAL_COMMUNITY): Payer: BC Managed Care – PPO | Admitting: Speech Pathology

## 2020-06-08 ENCOUNTER — Inpatient Hospital Stay (HOSPITAL_COMMUNITY): Payer: BC Managed Care – PPO | Admitting: Occupational Therapy

## 2020-06-08 ENCOUNTER — Encounter (HOSPITAL_COMMUNITY): Payer: Self-pay | Admitting: Physical Medicine & Rehabilitation

## 2020-06-08 LAB — COMPREHENSIVE METABOLIC PANEL
ALT: 43 U/L (ref 0–44)
AST: 39 U/L (ref 15–41)
Albumin: 2.6 g/dL — ABNORMAL LOW (ref 3.5–5.0)
Alkaline Phosphatase: 62 U/L (ref 38–126)
Anion gap: 12 (ref 5–15)
BUN: <5 mg/dL — ABNORMAL LOW (ref 6–20)
CO2: 21 mmol/L — ABNORMAL LOW (ref 22–32)
Calcium: 8.9 mg/dL (ref 8.9–10.3)
Chloride: 97 mmol/L — ABNORMAL LOW (ref 98–111)
Creatinine, Ser: 0.72 mg/dL (ref 0.44–1.00)
GFR calc Af Amer: >60 mL/min (ref >60)
GFR calc non Af Amer: >60 mL/min (ref >60)
Glucose, Bld: 114 mg/dL — ABNORMAL HIGH (ref 70–99)
Potassium: 3.9 mmol/L (ref 3.5–5.1)
Sodium: 130 mmol/L — ABNORMAL LOW (ref 135–145)
Total Bilirubin: 0.4 mg/dL (ref 0.3–1.2)
Total Protein: 8.6 g/dL — ABNORMAL HIGH (ref 6.5–8.1)

## 2020-06-08 LAB — CBC WITH DIFFERENTIAL/PLATELET
Abs Immature Granulocytes: 0.03 10*3/uL (ref 0.00–0.07)
Basophils Absolute: 0 10*3/uL (ref 0.0–0.1)
Basophils Relative: 0 %
Eosinophils Absolute: 0.1 10*3/uL (ref 0.0–0.5)
Eosinophils Relative: 2 %
HCT: 33.9 % — ABNORMAL LOW (ref 36.0–46.0)
Hemoglobin: 10.6 g/dL — ABNORMAL LOW (ref 12.0–15.0)
Immature Granulocytes: 1 %
Lymphocytes Relative: 30 %
Lymphs Abs: 1.5 10*3/uL (ref 0.7–4.0)
MCH: 27.7 pg (ref 26.0–34.0)
MCHC: 31.3 g/dL (ref 30.0–36.0)
MCV: 88.7 fL (ref 80.0–100.0)
Monocytes Absolute: 0.6 10*3/uL (ref 0.1–1.0)
Monocytes Relative: 11 %
Neutro Abs: 2.9 10*3/uL (ref 1.7–7.7)
Neutrophils Relative %: 56 %
Platelets: 450 10*3/uL — ABNORMAL HIGH (ref 150–400)
RBC: 3.82 MIL/uL — ABNORMAL LOW (ref 3.87–5.11)
RDW: 12.5 % (ref 11.5–15.5)
WBC: 5.1 10*3/uL (ref 4.0–10.5)
nRBC: 0 % (ref 0.0–0.2)

## 2020-06-08 LAB — CULTURE, BLOOD (ROUTINE X 2)
Culture: NO GROWTH
Culture: NO GROWTH
Special Requests: ADEQUATE
Special Requests: ADEQUATE

## 2020-06-08 LAB — URINE CULTURE: Culture: <10000 — AB

## 2020-06-08 MED ORDER — POTASSIUM CHLORIDE 20 MEQ PO PACK
20.0000 meq | PACK | Freq: Every day | ORAL | Status: DC
  Administered 2020-06-08 – 2020-06-20 (×13): 20 meq via ORAL
  Filled 2020-06-08 (×12): qty 1

## 2020-06-08 NOTE — Evaluation (Signed)
Physical Therapy Assessment and Plan  Patient Details  Name: Gail Montgomery MRN: [id number] Date of Birth: 12-Sep-1996  PT Diagnosis: Abnormality of gait, Cognitive deficits and Hemiparesis dominant Rehab Potential: Excellent ELOS: 14-16 days   Today's Date: 06/08/2020 PT Individual Time: 1300-1415 PT Individual Time Calculation (min): 75 min    Hospital Problem: Principal Problem:   Acute ischemic left MCA stroke [facility])   Past Medical History:  Past Medical History:  Diagnosis Date  . Anxiety   . Panic attack    Past Surgical History:  Past Surgical History:  Procedure Laterality Date  . BUBBLE STUDY  06/06/2020   Procedure: BUBBLE STUDY;  Surgeon: Pixie Casino, MD;  Location: [place];  Service: Cardiovascular;;  . IR CT HEAD LTD  05/19/2020  . IR CT HEAD LTD  05/19/2020  . IR PERCUTANEOUS ART THROMBECTOMY/INFUSION INTRACRANIAL INC DIAG ANGIO  05/19/2020  . IR US GUIDE VASC ACCESS RIGHT  05/19/2020  . RADIOLOGY WITH ANESTHESIA N/A 05/19/2020   Procedure: IR WITH ANESTHESIA;  Surgeon: Radiologist, Medication, MD;  Location: [place];  Service: Radiology;  Laterality: N/A;  . TEE WITHOUT CARDIOVERSION N/A 06/06/2020   Procedure: TRANSESOPHAGEAL ECHOCARDIOGRAM (TEE);  Surgeon: Pixie Casino, MD;  Location: [facility] ENDOSCOPY;  Service: Cardiovascular;  Laterality: N/A;    Assessment & Plan Clinical Impression: 24 year old female who was admitted to the hospital on 05/19/20 with sudden onset of right hemiparesis and aphasia. She presented as a code stroke with acute left MCA Infarct. She received TPA at 11:29 am, underwent thrombectomy With reperfusion, but Left ACA had residual small distal anterior cerebral clot. During the procedure there was an ICA vasospasm versus dissection initially thought she needs a stent however upon reimaging the vessel was open. She was loaded with aspirin and Plavix. She was also found to be Covid positive but was asymptomatic. Her hospital course was  complicated by [facility], parenchymal hemorrhage and interval ventricular hemorrhage. She completed the course of treatment for E. coli UTI. She was also found to have a DVT/PE and was started on IV heparin and eventually transitioned to Eliquis on 06/01/2020. She underwent TEE  showing PFO. She will  follow-up with cardiology as an outpatient for PFO closure.PPatient transferred to CIR on 06/07/2020 .   Patient currently requires mod with mobility secondary to muscle weakness, decreased cardiorespiratoy endurance, impaired timing and sequencing, unbalanced muscle activation, decreased coordination and decreased motor planning, decreased attention, decreased awareness, decreased problem solving, decreased safety awareness and decreased memory and decreased sitting balance, decreased standing balance, decreased postural control, hemiplegia and decreased balance strategies.  Prior to hospitalization, patient was independent  with mobility and lived with Alone (but will be going to parents home,  mom works from home) in a House home.  Home access is  Level entry.  Patient will benefit from skilled PT intervention to maximize safe functional mobility, minimize fall risk and decrease caregiver burden for planned discharge home with 24 hour assist.  Anticipate patient will benefit from follow up OP at discharge.  PT - End of Session Activity Tolerance: Tolerates 30+ min activity with multiple rests PT Assessment Rehab Potential (ACUTE/IP ONLY): Excellent PT Barriers to Discharge: Behavior PT Barriers to Discharge Comments: Impulsive, decreased safety awareness PT Patient demonstrates impairments in the following area(s): Balance;Behavior;Endurance;Motor;Safety PT Transfers Functional Problem(s): Bed Mobility;Bed to Chair;Car;Furniture;Floor PT Locomotion Functional Problem(s): Ambulation;Wheelchair Mobility;Stairs PT Plan PT Intensity: Minimum of 1-2 x/day ,45 to 90 minutes PT Frequency: 5 out of 7 days PT  Duration Estimated Length of Stay: 14-16 days PT Treatment/Interventions: Ambulation/gait training;DME/adaptive equipment instruction;Neuromuscular re-education;Psychosocial support;Stair training;UE/LE Strength taining/ROM;Wheelchair propulsion/positioning;Balance/vestibular training;Discharge planning;Therapeutic Activities;UE/LE Coordination activities;Cognitive remediation/compensation;Disease management/prevention;Functional mobility training;Patient/family education;Splinting/orthotics;Therapeutic Exercise PT Transfers Anticipated Outcome(s): supervision PT Locomotion Anticipated Outcome(s): supervision PT Recommendation Recommendations for Other Services: Therapeutic Recreation consult Therapeutic Recreation Interventions: Other (comment) (TBD by RT, pt in shcool for textiles, student at [employer], Cabin crew) Follow Up Recommendations: Outpatient PT;24 hour supervision/assistance Patient destination: Home Equipment Recommended: To be determined   PT Evaluation Precautions/Restrictions Precautions Precautions: Fall Precaution Comments: Impulsive General   Vital SignsTherapy Vitals Temp: 97.7 F (36.5 C) Pulse Rate: 93 Resp: 18 BP: 119/85 Patient Position (if appropriate): Sitting Oxygen Therapy SpO2: 100 % O2 Device: Room Air Pain Pain Assessment Pain Scale: 0-10 Pain Score: 0-No pain Home Living/Prior Functioning Home Living Family/patient expects to be discharged to:: Private residence Living Arrangements: Spouse/significant other Available Help at Discharge: Family;Available 24 hours/day Type of Home: House Home Access: Level entry Home Layout: Multi-level;Able to live on main level with bedroom/bathroom Alternate Level Stairs-Number of Steps: 2 Alternate Level Stairs-Rails:  (unable to ascertain)  Lives With: Alone (but will be going to parents home,  mom works from home) Vision/Perception  Perception Perception: Within Functional Limits Praxis Praxis: Intact   Cognition Overall Cognitive Status: Impaired/Different from baseline Arousal/Alertness: Awake/alert Orientation Level: Oriented to person;Oriented to place;Oriented to situation Attention: Sustained Sustained Attention: Impaired Sustained Attention Impairment: Verbal basic;Functional basic Sensation Sensation Light Touch: Appears Intact Proprioception: Appears Intact Coordination Gross Motor Movements are Fluid and Coordinated: No Fine Motor Movements are Fluid and Coordinated: No Finger Nose Finger Test: slight tremor R Heel Shin Test: poor ability on R due to weakness, moves in jerking manner w/increased effort Motor  Motor Motor: Hemiplegia Motor - Skilled Clinical Observations: R hip/knee weakness results in instability in upright   Trunk/Postural Assessment  Cervical Assessment Cervical Assessment: Within Functional Limits Thoracic Assessment Thoracic Assessment: Exceptions to [facility] (decreased abdominal activation creating post tendency/) Lumbar Assessment Lumbar Assessment: Within Functional Limits Postural Control Postural Control: Deficits on evaluation Trunk Control: decreased on r w/dynamic reaching in sitting and abdominal activation in static and dynamic standing Righting Reactions: delayed on R Protective Responses: delayed RLE  Balance Balance Balance Assessed: Yes Dynamic Sitting Balance Sitting balance - Comments: able to static sit w/supervision, max dynamic reaching to R results in R lob Extremity Assessment  RUE Assessment Passive Range of Motion (PROM) Comments: see ot eval for full assessment Active Range of Motion (AROM) Comments: decreased shoulder flexion grossly 100, abd grossly 90 General Strength Comments: shoulder grossly 3-, elbow grossly 4  see ot eval for details LUE Assessment LUE Assessment: Within Functional Limits RLE Assessment RLE Assessment: Exceptions to [facility] Active Range of Motion (AROM) Comments: limited by strength  deficits General Strength Comments: hip flexion grossly 2/5, knee 3-/5 quads, 2+/5 hams, ankle DF/PF 3-/5 LLE Assessment LLE Assessment: Within Functional Limits  Care Tool Care Tool Bed Mobility Roll left and right activity   Roll left and right assist level: Supervision/Verbal cueing    Sit to lying activity   Sit to lying assist level: Minimal Assistance - Patient > 75%    Lying to sitting edge of bed activity   Lying to sitting edge of bed assist level: Minimal Assistance - Patient > 75%     Care Tool Transfers Sit to stand transfer   Sit to stand assist level: Moderate Assistance - Patient 50 - 74%    Chair/bed transfer   Chair/bed  transfer assist level: Moderate Assistance - Patient 50 - 74%     Physiological scientist transfer assist level: Maximal Assistance - Patient 25 - 49%      Care Tool Locomotion Ambulation   Assist level: 2 helpers Assistive device: Hand held assist Max distance: 30  Walk 10 feet activity   Assist level: 2 helpers Assistive device: No Device   Walk 50 feet with 2 turns activity Walk 50 feet with 2 turns activity did not occur: Safety/medical concerns      Walk 150 feet activity Walk 150 feet activity did not occur: Safety/medical concerns      Walk 10 feet on uneven surfaces activity Walk 10 feet on uneven surfaces activity did not occur: Safety/medical concerns      Stairs   Assist level: Maximal Assistance - Patient 25 - 49% Stairs assistive device: 2 hand rails Max number of stairs: 1  Walk up/down 1 step activity   Walk up/down 1 step (curb) assist level: Maximal Assistance - Patient 25 - 49% Walk up/down 1 step or curb assistive device: 2 hand rails Walk up/down 4 steps activity did not occuR: Safety/medical concerns  Walk up/down 4 steps activity      Walk up/down 12 steps activity Walk up/down 12 steps activity did not occur: Safety/medical concerns      Pick up small objects from floor Pick up  small object from the floor (from standing position) activity did not occur: Safety/medical concerns      Wheelchair Will patient use wheelchair at discharge?: No          Wheel 50 feet with 2 turns activity      Wheel 150 feet activity        Refer to Care Plan for Long Term Goals  SHORT TERM GOAL WEEK 1 PT Short Term Goal 1 (Week 1): Pt will perform STS w/LRAD and cga PT Short Term Goal 2 (Week 1): Pt will ambulate 34f w/LRAD and mod assist of 1 PT Short Term Goal 3 (Week 1): pt will transfer bed to/from wc w/min assist and LRAD PT Short Term Goal 4 (Week 1): Pt will demonstrate safe wc set up prior to STS and transfers w/min cues  Recommendations for other services: Therapeutic Recreation  Outing/community reintegration  Skilled Therapeutic Intervention  Pt initially oob in wc, mother at doorway in discussion w/ST.  Pt verbal w/simple appropriate responses and agreeable to treatment.  Pt transported to hallway where mother stops therapist to discuss planned session.  Mother very concerned w/wether therapist understands daughters medical hx and therapist discussed and assured mother that proper monitoring and precautions would be taken at all times w/pt and that her full history had been reviewed prior to session.  Assured that vitals would be closely monitored throughout session and that all therapists were aware of PFO diagnoses.   Also educated mother of COVID restrictions and limited # of people permitted in gym at this time, mother voiced understanding.   Pt then instructed w/wc parts management and attempted propulsion w/bilat UEs w/poor ability to control veering to R, propelled 322fw/bilat LEs.  Mother concerned that activity might be too strenuous.  Assured pt was ok w/basic mobility.  HR 96-109 throughout session, 02 sats remain 98-100%.  Evaluation completed (see details above and below) with education on PT POC and goals and individual treatment initiated with focus on  functional mobility/transfers, LE strength, dynamic standing balance/coordination,  ambulation, stair navigation, simulated car transfers, and improved endurance with activity.    Pt performed gait w/mod assist of 2, initially leaning heavily to R w/poor control of RLE esp w/stance phase, signficant knee buckling requiring therapist tactile cueing to stabilize. Pt noted to be very impulsive during session, attempts to STS or transfer from wc without assist, discussed fall risk and safety w/pt.    At end of session, pt transported to room.  Mother in room and w/many questions about session, progress, anticipated progress, pt tolerance for activity, ability to transfer pt without nursing assist, overstimulation/mood changes/frustration by pt.  Discussed all issues and mother educated extensively.  Educated mother that she was not clear at this time to assist pt w/transfers and that pt was at very high fall risk.  Also educated mother in use of alarm belt and importance due to impulsivity/impaired safety awarenss/poor balance/weakness but mother continues to decline stating she would stay at pt side and call nursing for assist.   At end of session, pt left oob in recliner w/all needs in reach and mother seated at side.  Will need continued education on pt risk of falls to obtain cooperation from mother for use of alarm belt as impulsivity is signficant issue w/this pt.  Mobility Bed Mobility Bed Mobility: Rolling Right;Rolling Left;Right Sidelying to Sit;Sit to Supine Rolling Right: Supervision/verbal cueing (impulsive) Rolling Left: Supervision/Verbal cueing Right Sidelying to Sit: Minimal Assistance - Patient > 75% (for RLE mgmt) Sit to Supine: Minimal Assistance - Patient > 75% (and cues for safety) Transfers Transfers: Sit to Stand;Stand Pivot Transfers Sit to Stand: Moderate Assistance - Patient 50-74% Stand Pivot Transfers: Moderate Assistance - Patient 50 - 74% Stand Pivot Transfer Details  (indicate cue type and reason): pt w/RLE instability at knee w/standing/wt shifting to R w/transfer, initially mod post lean w/tactile facilitation for midline/trunk control/abd activation Transfer (Assistive device): None Locomotion  Gait Ambulation: Yes Gait Assistance: 2 Helpers Gait Distance (Feet): 35 Feet Assistive device: 2 person hand held assist Gait Assistance Details: mod of 2, manual facilitation for wt shifting, tactile cues for quad activation/stabilization of R ankle, leans heavily to R due to decreased R LE strength and trunk control w/knee buckling Gait Gait: Yes Gait Pattern: Impaired Gait velocity: pt w/decreasde hip and kee flexion and ankle DF w/swing, IC w/flatfoot and knee flexed approx 20degrees creating r lean, unable to control knee without tactile facilitation, does not fully extend loading to midstance,, mod assist of 2 Stairs / Additional Locomotion Curb: Maximal Assistance - Patient 25 - 49% (due to knee buckling) Wheelchair Mobility Wheelchair Mobility: Yes Wheelchair Assistance: Minimal assistance - Patient >75% (cannot coordinate w/UEs, propels 36f w/LEs and cues, limited RLE AROM w/activity) WEnvironmental health practitioner Both lower extermities Wheelchair Parts Management: Needs assistance Distance: 30   Discharge Criteria: Patient will be discharged from PT if patient refuses treatment 3 consecutive times without medical reason, if treatment goals not met, if there is a change in medical status, if patient makes no progress towards goals or if patient is discharged from hospital.  The above assessment, treatment plan, treatment alternatives and goals were discussed and mutually agreed upon: by patient and by family  BJerrilyn Cairo9/06/2020, 2:49 PM

## 2020-06-08 NOTE — Plan of Care (Signed)
Problem: RH BLADDER ELIMINATION Goal: RH STG MANAGE BLADDER WITH ASSISTANCE Description: STG Manage Bladder With  min Assistance Outcome: Progressing   Problem: RH SKIN INTEGRITY Goal: RH STG SKIN FREE OF INFECTION/BREAKDOWN Description: Manage with cues and reminders Outcome: Progressing   Problem: RH PAIN MANAGEMENT Goal: RH STG PAIN MANAGED AT OR BELOW PT'S PAIN GOAL Description: At or below level 4 Outcome: Progressing   Problem: RH SAFETY Goal: RH STG ADHERE TO SAFETY PRECAUTIONS W/ASSISTANCE/DEVICE Description: STG Adhere to Safety Precautions With  cues/reminders Assistance/Device. Outcome: Not Progressing  Patient is impulsive with urinary urgency.  Exhibits poor safety awareness.  Problem: RH KNOWLEDGE DEFICIT Goal: RH STG INCREASE KNOWLEDGE OF STROKE PROPHYLAXIS Description: Patient will be able to manage stroke prophlyaxis using handouts and educational materials with cues and reminders.  Outcome: Not Progressing

## 2020-06-08 NOTE — Evaluation (Signed)
Occupational Therapy Assessment and Plan  Patient Details  Name: Gail Montgomery MRN: [id number] Date of Birth: 06-28-1996  OT Diagnosis: cognitive deficits, hemiplegia affecting dominant side and muscle weakness (generalized) Rehab Potential: Rehab Potential (ACUTE ONLY): Good ELOS: 14-16 days   Today's Date: 06/08/2020 OT Individual Time: 1004-1100 OT Individual Time Calculation (min): 56 min     Hospital Problem: Principal Problem:   Acute ischemic left MCA stroke [facility])   Past Medical History:  Past Medical History:  Diagnosis Date  . Anxiety   . Panic attack    Past Surgical History:  Past Surgical History:  Procedure Laterality Date  . BUBBLE STUDY  06/06/2020   Procedure: BUBBLE STUDY;  Surgeon: Pixie Casino, MD;  Location: [facility];  Service: Cardiovascular;;  . IR CT HEAD LTD  05/19/2020  . IR CT HEAD LTD  05/19/2020  . IR PERCUTANEOUS ART THROMBECTOMY/INFUSION INTRACRANIAL INC DIAG ANGIO  05/19/2020  . IR US GUIDE VASC ACCESS RIGHT  05/19/2020  . RADIOLOGY WITH ANESTHESIA N/A 05/19/2020   Procedure: IR WITH ANESTHESIA;  Surgeon: Radiologist, Medication, MD;  Location: [facility];  Service: Radiology;  Laterality: N/A;  . TEE WITHOUT CARDIOVERSION N/A 06/06/2020   Procedure: TRANSESOPHAGEAL ECHOCARDIOGRAM (TEE);  Surgeon: Pixie Casino, MD;  Location: [facility] ENDOSCOPY;  Service: Cardiovascular;  Laterality: N/A;    Assessment & Plan Clinical Impression: Patient is a 24 y.o. year old female with recent admission to the hospital  withhistory of anxiety, recreational marijuana use, on OCP who was admitted on 05/19/20 with sudden onset of right sided weakness and difficulty speaking.  Fully vaccinated a month ago but noted to be covid + at admission. CT head showed dense L-MCA and CTA head/neck showed emergent LVO at left ICA terminus continuing into L-MCA. She underwent cerebral angio with thrombectomy of L-MCA with flow . Post procedure CT head showed large SAH with ventricular  extension and received TXA and cryoprecipitate to stop the bleeding. MRI brain showed early subacute infarct in left basal ganglia and SAH along interhemispheric fissure into lateral ventricles with early communicating hydrocephalus. No surgical intervention needed per Dr. Christella Noa and she was started on hypertonic saline to manage edema. On nimotop due to concerns of vasospasms. She self extubated on 08/22 and respiratory status stable/ no Covid symptoms.   Repeat CT head showed progression of hydrocephalus treated conservatively with nimodipine to keep SBP<160.  BLE dopplers negative for DVT. 2D echo showed EF 60-65% with increase in right atrial pressure and TCD showed evidence of PFO. Dr. Erlinda Hong felt stroke embolic question ICA dissection v/s paradoxical emboli from PFO v/s Covid hyper oagulopathy.  She developed feverssT-max 103 on 08/25 and started on broad spectrum antibiotics 08/25-08/27 but continued to have fevers with HR occasionally up to 150's.  Work up negative and antibiotics d/c on 08/29 as symptoms felt to be central v/s Covid related.  Repeat dopplers 08/30 showed acute DVT right femoral, PV and GV. CTA chest done showing bilateral segmental Left>Right PE with mild cardiomegaly. She was started on IV heparin and transitioned to Eliquis on 09/03.    Due to ongoing lethargy, EEG done showing generalized slowing lateralized to left hemisphere--no seizures. Ceftriaxone added for E coli UTI but continued to have fevers repeat BC 09/05--negative X 4 days. Has completed 3 day course of ceftriaxone for UTI--?ESBL. 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 Dr. Debara Pickett recommends closure in the future. Patient with resultant  dysphagia--advanced to regulars, aphasia, right sided weakness and apraxia affecting ADL tasks as well as mobility.  Patient transferred to CIR on 06/07/2020 .    Patient currently requires min  with basic self-care skills secondary to muscle weakness, decreased coordination and decreased motor planning, decreased attention, decreased awareness, decreased problem solving, decreased safety awareness, decreased memory and delayed processing and decreased sitting balance, decreased standing balance, hemiplegia and decreased balance strategies.  Prior to hospitalization, patient could complete ADLs and IADLs with independent .  Patient will benefit from skilled intervention to decrease level of assist with basic self-care skills prior to discharge home with care partner.  Anticipate patient will require 24 hour supervision and to be determined.  OT - End of Session Activity Tolerance: Tolerates 30+ min activity with multiple rests Endurance Deficit: Yes Endurance Deficit Description: Pt needs seated RB's after standing during functional tasks and transfers OT Assessment Rehab Potential (ACUTE ONLY): Good OT Patient demonstrates impairments in the following area(s): Balance;Cognition;Endurance;Motor OT Basic ADL's Functional Problem(s): Grooming;Bathing;Dressing;Toileting OT Advanced ADL's Functional Problem(s): Simple Meal Preparation;Light Housekeeping OT Transfers Functional Problem(s): Toilet;Tub/Shower OT Additional Impairment(s): Fuctional Use of Upper Extremity OT Plan OT Intensity: Minimum of 1-2 x/day, 45 to 90 minutes OT Frequency: 5 out of 7 days OT Duration/Estimated Length of Stay: 14-16 days OT Treatment/Interventions: Balance/vestibular training;Discharge planning;Self Care/advanced ADL retraining;Therapeutic Activities;UE/LE Coordination activities;Cognitive remediation/compensation;Disease mangement/prevention;Functional mobility training;Patient/family education;Therapeutic Exercise;DME/adaptive equipment instruction;Neuromuscular re-education;Psychosocial support;UE/LE Strength taining/ROM OT Self Feeding Anticipated Outcome(s): independent OT Basic Self-Care Anticipated  Outcome(s): supervision OT Toileting Anticipated Outcome(s): supervision OT Bathroom Transfers Anticipated Outcome(s): supervision OT Recommendation Recommendations for Other Services: Therapeutic Recreation consult Therapeutic Recreation Interventions:  (leisure) Patient destination: Home Follow Up Recommendations: 24 hour supervision/assistance Equipment Recommended: To be determined   OT Evaluation Precautions/Restrictions  Precautions Precautions: Fall Precaution Comments: Impulsive; Right sided weakness (LE>UE) Restrictions Weight Bearing Restrictions: No General Chart Reviewed: Yes Pain Pain Assessment Pain Scale: 0-10 Pain Score: 5  Pain Location: Foot Pain Orientation: Right;Left Pain Descriptors / Indicators: Discomfort Pain Onset: On-going Pain Intervention(s): Medication (See eMAR);Elevated extremity Home Living/Prior Functioning Home Living Family/patient expects to be discharged to:: Private residence Living Arrangements: Spouse/significant other Available Help at Discharge: Family, Available 24 hours/day Type of Home: House Home Access: Level entry Home Layout: Multi-level, Able to live on main level with bedroom/bathroom Alternate Level Stairs-Number of Steps: 2 Alternate Level Stairs-Rails:  (unable to ascertain) Bathroom Shower/Tub: Multimedia programmer: Standard  Lives With: Alone IADL History Occupation: Part time employment, Ship broker Prior Function Level of Independence: Independent with basic ADLs, Independent with homemaking with ambulation, Independent with gait Driving: Yes Comments: 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 Vision/History: Wears glasses Wears Glasses: At all times Patient Visual Report: No change from baseline Vision Assessment?: No apparent visual deficits Perception  Perception: Within Functional  Limits Praxis Praxis: Intact Cognition Overall Cognitive Status: Impaired/Different from baseline Arousal/Alertness: Awake/alert Orientation Level: Person;Place;Situation Person: Oriented Place: Oriented Situation: Oriented Year: 2021 Month: September Day of Week: Correct Memory: Impaired Memory Impairment: Decreased short term memory Decreased Short Term Memory: Functional basic Immediate Memory Recall: Sock;Blue;Bed Memory Recall Sock: Without Cue Memory Recall Blue: Without Cue Memory Recall Bed: Without Cue Attention: Sustained;Divided Sustained Attention: Impaired Sustained Attention Impairment: Verbal basic;Functional basic Divided Attention: Impaired Divided Attention Impairment: Functional basic;Verbal basic Awareness: Impaired Awareness Impairment: Emergent impairment Problem Solving: Impaired Problem Solving Impairment: Functional basic Executive Function: Initiating;Self Monitoring  Initiating: Appears intact Self Monitoring: Impaired Self Monitoring Impairment: Functional basic Safety/Judgment: Impaired Comments: Per nurse, pt recently attempting to get out of bed unsafely despite reminders to ask for help Sensation Sensation Light Touch: Appears Intact Hot/Cold: Appears Intact Proprioception: Appears Intact Coordination Gross Motor Movements are Fluid and Coordinated: No Fine Motor Movements are Fluid and Coordinated: No Finger Nose Finger Test: slight tremor R Heel Shin Test: poor ability on R due to weakness, moves in jerking manner w/increased effort Motor  Motor Motor: Hemiplegia Motor - Skilled Clinical Observations: R hip/knee weakness results in instability in upright  Trunk/Postural Assessment  Cervical Assessment Cervical Assessment: Within Functional Limits Thoracic Assessment Thoracic Assessment: Within Functional Limits Lumbar Assessment Lumbar Assessment: Within Functional Limits Postural Control Postural Control: Deficits on  evaluation Trunk Control: decreased on r w/dynamic reaching in sitting and abdominal activation in static and dynamic standing Righting Reactions: delayed on R Protective Responses: delayed RLE  Balance Balance Balance Assessed: Yes Dynamic Sitting Balance Sitting balance - Comments: able to static sit w/supervision, max dynamic reaching to R results in R lob Extremity/Trunk Assessment RUE Assessment RUE Assessment: Exceptions to [facility] Passive Range of Motion (PROM) Comments: see ot eval for full assessment Active Range of Motion (AROM) Comments: AROM FF: 120, ABD 110 General Strength Comments: Shoulder: 3-/5 FF, ABD; 3+/5 ER; 4-/5 elbow flex/ext and gross grasp LUE Assessment LUE Assessment: Within Functional Limits  Care Tool Care Tool Self Care Eating   Eating Assist Level: Set up assist    Oral Care    Oral Care Assist Level: Set up assist    Bathing   Body parts bathed by patient: Right arm;Left arm;Chest;Abdomen;Front perineal area;Buttocks;Right upper leg;Left upper leg;Right lower leg;Left lower leg;Face (simulated due to pts mother reports she washed up this AM already)     Assist Level: Contact Guard/Touching assist    Upper Body Dressing(including orthotics)   What is the patient wearing?: Pull over shirt   Assist Level: Supervision/Verbal cueing    Lower Body Dressing (excluding footwear)          Putting on/Taking off footwear     Assist for footwear: Supervision/Verbal cueing       Care Tool Toileting Toileting activity   Assist for toileting: Minimal Assistance - Patient > 75%     Care Tool Bed Mobility Roll left and right activity   Roll left and right assist level: Supervision/Verbal cueing    Sit to lying activity   Sit to lying assist level: Minimal Assistance - Patient > 75%    Lying to sitting edge of bed activity   Lying to sitting edge of bed assist level: Minimal Assistance - Patient > 75%     Care Tool Transfers Sit to stand  transfer   Sit to stand assist level: Moderate Assistance - Patient 50 - 74%    Chair/bed transfer   Chair/bed transfer assist level: Moderate Assistance - Patient 50 - 74%     Toilet transfer   Assist Level: Minimal Assistance - Patient > 75%     Care Tool Cognition Expression of Ideas and Wants Expression of Ideas and Wants: Some difficulty - exhibits some difficulty with expressing needs and ideas (e.g, some words or finishing thoughts) or speech is not clear   Understanding Verbal and Non-Verbal Content Understanding Verbal and Non-Verbal Content: Usually understands - understands most conversations, but misses some part/intent of message. Requires cues at times to understand   Memory/Recall Ability *first 3 days only Memory/Recall Ability *  first 3 days only: Location of own room;That he or she is in a hospital/hospital unit;Current season    Refer to Care Plan for Long Term Goals  SHORT TERM GOAL WEEK 1 OT Short Term Goal 1 (Week 1): Pt will complete toilet transfer with CGA OT Short Term Goal 2 (Week 1): Pt will complete shower transfer with min assist OT Short Term Goal 3 (Week 1): Pt will complete LB dressing with min assist OT Short Term Goal 4 (Week 1): Pt will complete LB bathing with CGA  Recommendations for other services: Therapeutic Recreation  Other leisure   Skilled Therapeutic Intervention ADL ADL Where Assessed-Eating: Wheelchair Grooming: Setup Where Assessed-Grooming: Sitting at sink Upper Body Dressing: Setup Where Assessed-Upper Body Dressing: Sitting at sink Toilet Transfer: Minimal assistance Toilet Transfer Method: Stand pivot Toilet Transfer Equipment: Bedside commode Mobility  Bed Mobility Bed Mobility: Rolling Right;Rolling Left;Right Sidelying to Sit Rolling Right: Supervision/verbal cueing Rolling Left: Supervision/Verbal cueing Right Sidelying to Sit: Minimal Assistance - Patient > 75% Sit to Supine: Minimal Assistance - Patient > 75% (and  cues for safety) Transfers Sit to Stand: Minimal Assistance - Patient > 75% Stand to Sit: Minimal Assistance - Patient > 75%   Skilled Interventions: Pt supine in bed, agreeable to OT session.  Initial evaluation complete, collaborated with pt and mother regarding POC, and educated pt on OT scope of practice.  Pts mother reports she already bathed and dressed pt this morning.  Educated pt and wife regarding focus of OT sessions on self care and BADLs for future sessions.  Pt completed functional mobility per above levels of assist and pt requesting to brush teeth again at sink.  Pt completed oral hygiene with setup. Pt completed self care per above levels of assist.  Pt needing VCs and education on safe transfer technique due to prematurely sitting during SPT w/c to recliner.  Pt ended session sitting in recliner, call bell in reach, seat belt alarm on.    Discharge Criteria: Patient will be discharged from OT if patient refuses treatment 3 consecutive times without medical reason, if treatment goals not met, if there is a change in medical status, if patient makes no progress towards goals or if patient is discharged from hospital.  The above assessment, treatment plan, treatment alternatives and goals were discussed and mutually agreed upon: by patient and by family  Ezekiel Slocumb 06/08/2020, 4:37 PM

## 2020-06-08 NOTE — Progress Notes (Signed)
[place] PHYSICAL MEDICINE & REHABILITATION PROGRESS NOTE   Subjective/Complaints: Mrs. Gail Montgomery complains of some right leg pain today where she has her DVT K+ is 3.3 today- will start daily supplement and repeat Monday. She feels she has not had a BM in 2 days but does not want any stool softeners of prune juice.   ROS: +pain, constipation  Objective:   ECHO TEE  Result Date: 06/06/2020    TRANSESOPHOGEAL ECHO REPORT   Patient Name:   Gail Montgomery Date of Exam: 06/06/2020 Medical Rec #:  [id number]    Height:       64.0 in Accession #:    [id number]   Weight:       187.6 lb Date of Birth:  September 03, 1996    BSA:          1.904 m Patient Age:    24 years     BP:           119/69 mmHg Patient Gender: F            HR:           94 bpm. Exam Location:  Inpatient Procedure: 3D Echo, Color Doppler, Cardiac Doppler and Transesophageal Echo Indications:     Stroke 434.91 / I63.9  History:         Patient has prior history of Echocardiogram examinations, most                  recent 05/29/2020. Risk Factors:Current Smoker. ICA occlusion,                  DVT and PE, History of COVID 04/2020.  Sonographer:     Leta Jungling RDCS Referring Phys:  [id number] [place] Diagnosing Phys: Zoila Shutter MD PROCEDURE: The transesophogeal probe was passed without difficulty through the esophogus of the patient. Sedation performed by different physician. The patient was monitored while under deep sedation. Anesthestetic sedation was provided intravenously by Anesthesiology: 208.71mg  of Propofol. The patient developed no complications during the procedure. A direct current cardioversion was performed. IMPRESSIONS  1. Left ventricular ejection fraction, by estimation, is 60 to 65%. The left ventricle has normal function.  2. Right ventricular systolic function is normal. The right ventricular size is normal.  3. No left atrial/left atrial appendage thrombus was detected.  4. The mitral valve is grossly normal. Trivial mitral  valve regurgitation.  5. The aortic valve is tricuspid. Aortic valve regurgitation is not visualized.  6. Evidence of atrial level shunting detected by color flow Doppler. Agitated saline contrast bubble study was positive with shunting observed within 3-6 cardiac cycles suggestive of interatrial shunt. There is a moderately sized patent foramen ovale with bidirectional shunting across atrial septum. Conclusion(s)/Recommendation(s): Findings are concerning for an interatrial shunt as detailed above. Moderate sized bidirectional PFO. Given young age and recent stroke, would recommend structural heart team evaluation for PFO closure. FINDINGS  Left Ventricle: Left ventricular ejection fraction, by estimation, is 60 to 65%. The left ventricle has normal function. The left ventricular internal cavity size was normal in size. There is no left ventricular hypertrophy. Right Ventricle: The right ventricular size is normal. No increase in right ventricular wall thickness. Right ventricular systolic function is normal. Left Atrium: Left atrial size was normal in size. No left atrial/left atrial appendage thrombus was detected. Right Atrium: Right atrial size was normal in size. Pericardium: There is no evidence of pericardial effusion. Mitral Valve: The mitral valve is grossly normal. Trivial  mitral valve regurgitation. Tricuspid Valve: The tricuspid valve is grossly normal. Tricuspid valve regurgitation is trivial. Aortic Valve: The aortic valve is tricuspid. Aortic valve regurgitation is not visualized. Pulmonic Valve: The pulmonic valve was normal in structure. Pulmonic valve regurgitation is not visualized. Aorta: The aortic root and ascending aorta are structurally normal, with no evidence of dilitation. IAS/Shunts: The interatrial septum is aneurysmal. Evidence of atrial level shunting detected by color flow Doppler. Agitated saline contrast was given intravenously to evaluate for intracardiac shunting. Agitated saline  contrast bubble study was positive  with shunting observed within 3-6 cardiac cycles suggestive of interatrial shunt. A moderately sized patent foramen ovale is detected with bidirectional shunting across atrial septum. Zoila Shutter MD Electronically signed by Zoila Shutter MD Signature Date/Time: 06/06/2020/3:12:47 PM    Final    Recent Labs    06/06/20 0135  WBC 6.2  HGB 9.3*  HCT 29.1*  PLT 503*   Recent Labs    06/06/20 0135 06/07/20 0929  NA 129* 129*  K 4.2 3.3*  CL 97* 97*  CO2 24 20*  GLUCOSE 103* 119*  BUN <5* <5*  CREATININE 0.51 0.64  CALCIUM 8.6* 8.6*    Intake/Output Summary (Last 24 hours) at 06/08/2020 1010 Last data filed at 06/08/2020 0815 Gross per 24 hour  Intake 360 ml  Output --  Net 360 ml     Physical Exam: Vital Signs Blood pressure 121/84, pulse (!) 101, temperature 98.7 F (37.1 C), temperature source Oral, resp. rate 16, height 5\' 4"  (1.626 m), weight 77.9 kg, SpO2 99 %. General: Alert, No apparent distress HEENT: Head is normocephalic, atraumatic, PERRLA, EOMI, sclera anicteric, oral mucosa pink and moist, dentition intact, ext ear canals clear,  Neck: Supple without JVD or lymphadenopathy Heart: Tachycardic. No murmurs rubs or gallops Chest: CTA bilaterally without wheezes, rales, or rhonchi; no distress Abdomen: Soft, non-tender, non-distended, bowel sounds positive. Extremities: No clubbing, RLE tender to palpation Skin: IV L forearm - looks good Neurological:     Mental Status: She is alert.     Comments: Non verbal--mother answered all questions.  Started talking and said a few sentences- short concise sentences at end- but otherwise nodding head yes or no or mother talking for her.  Intact to light touch in all 4 extremities and face Of note, bed soaked due to incontinence x1  Musculoskeletal: 1-2+ nonpitting RLE edema- mainly to knee RUE- proximally 3+/5- in biceps, triceps and 4-/5 in WE/grip and finger abd LUE 5/5 in same  muscles RLE- HF 2-/5, KE/KF 1/5, DF 1/5, PF 0/5 LLE- 5-/5 in same muscles Psych: Pt's affect is appropriate. Pt is cooperative  Assessment/Plan: 1. Functional deficits secondary to acute left ischemic stroke, which require 3+ hours per day of interdisciplinary therapy in a comprehensive inpatient rehab setting.  Physiatrist is providing close team supervision and 24 hour management of active medical problems listed below.  Physiatrist and rehab team continue to assess barriers to discharge/monitor patient progress toward functional and medical goals  Care Tool:  Bathing              Bathing assist       Upper Body Dressing/Undressing Upper body dressing        Upper body assist      Lower Body Dressing/Undressing Lower body dressing      What is the patient wearing?: Underwear/pull up     Lower body assist Assist for lower body dressing: Maximal Assistance - Patient 25 - 49%  Toileting Toileting    Toileting assist Assist for toileting: Maximal Assistance - Patient 25 - 49% (bsc)     Transfers Chair/bed transfer  Transfers assist           Locomotion Ambulation   Ambulation assist              Walk 10 feet activity   Assist           Walk 50 feet activity   Assist           Walk 150 feet activity   Assist           Walk 10 feet on uneven surface  activity   Assist           Wheelchair     Assist               Wheelchair 50 feet with 2 turns activity    Assist            Wheelchair 150 feet activity     Assist          Blood pressure 121/84, pulse (!) 101, temperature 98.7 F (37.1 C), temperature source Oral, resp. rate 16, height 5\' 4"  (1.626 m), weight 77.9 kg, SpO2 99 %.  Medical Problem List and Plan: 1.  R hemiparesis secondary to L MCA stroke due to RLE DVT/ B/L PE's and PFO- PFO to be addressed outpatient- s/p thrombectomy/SAH/IPH/IVH             -patient may   shower             -ELOS/Goals: 2 to 2.5 weeks- goals Mod I to supervision  -Continue CIR 2.  B-PE/RLE DVT: Antithrombotics: -anticoagulation:  Pharmaceutical: Other (comment)--Eliquis             -antiplatelet therapy: N/a 3. Pain Management: Tylenol prn. Well controlled.  4. Mood: LCSW to follow for evaluation and support.              -antipsychotic agents: N/A 5. Neuropsych: This patient is not fully capable of making decisions on her own behalf. 6. Skin/Wound Care: Routine pressure relief measures.  7. Fluids/Electrolytes/Nutrition: Monitor I/O -check lytes in am. Encourage fluids besides water.   9/10: K+ is 3.3. Start klor daily and repeat Monday.  8. Fevers: Inflammatory markers improving. Continues to have fevers--Tmax-100.4 last night. Felt to be due to clot burden/central. Will recheck UA/UCS to check for clearance.   9/10: afebrile yesterday and today. UA unequivocal. Culture is pending.  9. SIADH/Hyponatremia: Na has been low as 126 and continues to fluctuate-129 today. Add fluid restrictions. Will continue to monitor with daily checks.  10. HTN: Monitor BP tid--SBP goal <160.  11. Resting tachycardia: HR continues to range in 120's. Multifactorial --continue to monitor with increase in activity.  12. Anemia/thrombocytosis: Due to illness. Is stable--will continue to monitor  13. Sick Euthyroid syndrome: Needs follow up thyroid panel in 4-6 weeks.  14. Occ urinary incontinence- common in stroke pts- will let staff know to help.    LOS: 1 days A FACE TO FACE EVALUATION WAS PERFORMED  Giancarlo Askren P Jenicka Coxe 06/08/2020, 10:10 AM

## 2020-06-08 NOTE — Evaluation (Signed)
Speech Language Pathology Assessment and Plan  Patient Details  Name: Gail Montgomery MRN: [id number] Date of Birth: March 24, 1996  SLP Diagnosis: Cognitive Impairments;Dysphagia;Speech and Language deficits  Rehab Potential: Excellent ELOS: 14-16 days    Today's Date: 06/08/2020 SLP Individual Time: 7681-1572 SLP Individual Time Calculation (min): 41 min   Hospital Problem: Principal Problem:   Acute ischemic left MCA stroke [facility])  Past Medical History:  Past Medical History:  Diagnosis Date  . Anxiety   . Panic attack    Past Surgical History:  Past Surgical History:  Procedure Laterality Date  . BUBBLE STUDY  06/06/2020   Procedure: BUBBLE STUDY;  Surgeon: Pixie Casino, MD;  Location: [place];  Service: Cardiovascular;;  . IR CT HEAD LTD  05/19/2020  . IR CT HEAD LTD  05/19/2020  . IR PERCUTANEOUS ART THROMBECTOMY/INFUSION INTRACRANIAL INC DIAG ANGIO  05/19/2020  . IR US GUIDE VASC ACCESS RIGHT  05/19/2020  . RADIOLOGY WITH ANESTHESIA N/A 05/19/2020   Procedure: IR WITH ANESTHESIA;  Surgeon: Radiologist, Medication, MD;  Location: [place];  Service: Radiology;  Laterality: N/A;  . TEE WITHOUT CARDIOVERSION N/A 06/06/2020   Procedure: TRANSESOPHAGEAL ECHOCARDIOGRAM (TEE);  Surgeon: Pixie Casino, MD;  Location: [facility] ENDOSCOPY;  Service: Cardiovascular;  Laterality: N/A;    Assessment / Plan / Recommendation Clinical Impression Patient is a 24 year old RH-female with history of anxiety, recreational marijuana use, on OCP who was admitted on 05/19/20 with sudden onset of right sided weakness and difficulty speaking.  Fully vaccinated a month ago but noted to be covid + at admission. CT head showed dense L-MCA and CTA head/neck showed emergent LVO at left ICA terminus continuing into L-MCA. She underwent cerebral angio with thrombectomy of L-MCA with flow . Post procedure CT head showed large SAH with ventricular extension and received TXA and cryoprecipitate to stop the bleeding. MRI  brain showed early subacute infarct in left basal ganglia and SAH along interhemispheric fissure into lateral ventricles with early communicating hydrocephalus. No surgical intervention needed per Dr. Christella Noa and she was started on hypertonic saline to manage edema. On nimotop due to concerns of vasospasms. She self extubated on 08/22 and respiratory status stable/ no Covid symptoms.  Repeat CT head showed progression of hydrocephalus treated conservatively with nimodipine to keep SBP<160.  BLE dopplers negative for DVT. 2D echo showed EF 60-65% with increase in right atrial pressure and TCD showed evidence of PFO. Dr. Erlinda Hong felt stroke embolic question ICA dissection v/s paradoxical emboli from PFO v/s Covid hyper oagulopathy.  She developed feverssT-max 103 on 08/25 and started on broad spectrum antibiotics 08/25-08/27 but continued to have fevers with HR occasionally up to 150's.  Work up negative and antibiotics d/c on 08/29 as symptoms felt to be central v/s Covid related.  Repeat dopplers 08/30 showed acute DVT right femoral, PV and GV. CTA chest done showing bilateral segmental Left>Right PE with mild cardiomegaly. She was started on IV heparin and transitioned to Eliquis on 09/03.  Due to ongoing lethargy, EEG done showing generalized slowing lateralized to left hemisphere--no seizures. CIR recommended due to functional decline and patient admitted 06/07/20.   Patient consumed regular textures and thin liquids via straw without overt s/s of aspiration but Min A verbal cues were needed to self-monitor and correct mild right buccal pocketing throughout meal. Recommend patient continue current diet with intermittent supervision.  Patient's verbal output appears significantly improved today. Patient answered all questions at the phrase and sentence level without word-finding deficits in  structured tasks. However, mild higher-level word-finding deficits were noted during an informal conversation. Patient's auditory  comprehension also appeared intact for all tasks assessed. A formal cognitive evaluation was not administered but mild-moderate deficits were noted in short-term recall, complex problem solving and sustained attention. Patient would benefit from skilled SLP intervention to maximize her swallowing and cognitive-linguistic functioning prior to discharge.    Skilled Therapeutic Interventions          Administered a BSE and cognitive-linguistic evaluation, please see above for details.   SLP Assessment  Patient will need skilled Speech Lanaguage Pathology Services during CIR admission    Recommendations  SLP Diet Recommendations: Age appropriate regular solids;Thin Liquid Administration via: Straw Medication Administration: Whole meds with liquid Supervision: Patient able to self feed;Intermittent supervision to cue for compensatory strategies Compensations: Slow rate;Small sips/bites;Minimize environmental distractions Postural Changes and/or Swallow Maneuvers: Seated upright 90 degrees Oral Care Recommendations: Oral care BID Recommendations for Other Services: Neuropsych consult Patient destination: Home Follow up Recommendations: Outpatient SLP;24 hour supervision/assistance Equipment Recommended: None recommended by SLP    SLP Frequency 3 to 5 out of 7 days   SLP Duration  SLP Intensity  SLP Treatment/Interventions 14-16 days  Minumum of 1-2 x/day, 30 to 90 minutes  Cognitive remediation/compensation;Internal/external aids;Therapeutic Activities;Environmental controls;Cueing hierarchy;Functional tasks;Patient/family education;Speech/Language facilitation    Pain Pain Assessment Pain Scale: 0-10 Pain Score: 0-No pain  Prior Functioning Type of Home: House  Lives With: Alone (but will be going to parents home,  mom works from home) Available Help at Discharge: Family;Available 24 hours/day  SLP Evaluation Cognition Overall Cognitive Status: Impaired/Different from  baseline Arousal/Alertness: Awake/alert Orientation Level: Oriented to person;Oriented to place;Oriented to situation;Disoriented to time Attention: Sustained Sustained Attention: Impaired Sustained Attention Impairment: Verbal basic;Functional basic Memory: Impaired Memory Impairment: Decreased short term memory Decreased Short Term Memory: Functional basic Awareness: Impaired Awareness Impairment: Emergent impairment Problem Solving: Impaired Problem Solving Impairment: Functional basic Safety/Judgment: Impaired  Comprehension Auditory Comprehension Overall Auditory Comprehension: Appears within functional limits for tasks assessed Yes/No Questions: Within Functional Limits Basic Biographical Questions: 76-100% accurate Basic Immediate Environment Questions: 75-100% accurate Commands: Within Functional Limits One Step Basic Commands: 75-100% accurate Conversation: Simple Visual Recognition/Discrimination Discrimination: Not tested Reading Comprehension Reading Status: Not tested Expression Expression Primary Mode of Expression: Verbal Verbal Expression Overall Verbal Expression: Appears within functional limits for tasks assessed Initiation: No impairment Automatic Speech: Name;Social Response Level of Generative/Spontaneous Verbalization: Phrase;Sentence Repetition: No impairment Naming: No impairment Confrontation: Within functional limits Other Verbal Expression Comments: Mild high-level word finding issues noted at the conversation level Written Expression Dominant Hand: Right Written Expression: Exceptions to [facility] Oral Motor Oral Motor/Sensory Function Overall Oral Motor/Sensory Function: Within functional limits Motor Speech Overall Motor Speech: Appears within functional limits for tasks assessed  Care Tool Care Tool Cognition Expression of Ideas and Wants Expression of Ideas and Wants: Some difficulty - exhibits some difficulty with expressing needs and  ideas (e.g, some words or finishing thoughts) or speech is not clear   Understanding Verbal and Non-Verbal Content Understanding Verbal and Non-Verbal Content: Usually understands - understands most conversations, but misses some part/intent of message. Requires cues at times to understand   Memory/Recall Ability *first 3 days only Memory/Recall Ability *first 3 days only: Location of own room;That he or she is in a hospital/hospital unit;Current season     Bedside Swallowing Assessment General Date of Onset: 05/19/20 Previous Swallow Assessment: BSE on acute Diet Prior to this Study: Thin liquids;Regular Temperature Spikes Noted: Yes Respiratory  Status: Room air History of Recent Intubation: Yes Length of Intubations (days): 1 days Date extubated: 05/20/20 Behavior/Cognition: Alert;Cooperative;Pleasant mood Oral Cavity - Dentition: Adequate natural dentition Self-Feeding Abilities: Able to feed self;Needs assist Vision: Functional for self-feeding Patient Positioning: Upright in bed Baseline Vocal Quality: Normal Volitional Cough: Strong Volitional Swallow: Able to elicit  Ice Chips Ice chips: Not tested Thin Liquid Thin Liquid: Within functional limits Presentation: Self Fed;Straw Nectar Thick Nectar Thick Liquid: Not tested Honey Thick Honey Thick Liquid: Not tested Puree Puree: Within functional limits Presentation: Self Fed;Spoon Solid Solid: Impaired Presentation: Self Fed;Spoon Oral Phase Functional Implications: Right lateral sulci pocketing BSE Assessment Risk for Aspiration Impact on safety and function: Mild aspiration risk  Short Term Goals: Week 1: SLP Short Term Goal 1 (Week 1): Patient will consume current diet without overt s/s of aspiration and overall Mod I for use of swallowing compensatory strategies. SLP Short Term Goal 2 (Week 1): Patient will demonstrate sustained attention to functional tasks with Min verbal cus for redirection. SLP Short Term  Goal 3 (Week 1): Patient will demonstrate functional problem solving for mildly complex tasks with Min A verbal cues. SLP Short Term Goal 4 (Week 1): Patient will recall new, daily information with Min verbal and visual cues for use of memory compensatory strategies. SLP Short Term Goal 5 (Week 1): Patient will utilize word-finding strategies at the conversation level with supervision level verbal cues.  Refer to Care Plan for Long Term Goals  Recommendations for other services: Neuropsych  Discharge Criteria: Patient will be discharged from SLP if patient refuses treatment 3 consecutive times without medical reason, if treatment goals not met, if there is a change in medical status, if patient makes no progress towards goals or if patient is discharged from hospital.  The above assessment, treatment plan, treatment alternatives and goals were discussed and mutually agreed upon: by patient and by family  Ervin Hensley 06/08/2020, 3:31 PM

## 2020-06-08 NOTE — Progress Notes (Signed)
Inpatient Rehabilitation  Patient information reviewed and entered into eRehab system by Unnamed Hino M. Mina Carlisi, M.A., CCC/SLP, PPS Coordinator.  Information including medical coding, functional ability and quality indicators will be reviewed and updated through discharge.    

## 2020-06-09 DIAGNOSIS — I2699 Other pulmonary embolism without acute cor pulmonale: Secondary | ICD-10-CM

## 2020-06-09 DIAGNOSIS — E876 Hypokalemia: Secondary | ICD-10-CM

## 2020-06-09 DIAGNOSIS — E222 Syndrome of inappropriate secretion of antidiuretic hormone: Secondary | ICD-10-CM

## 2020-06-09 DIAGNOSIS — D62 Acute posthemorrhagic anemia: Secondary | ICD-10-CM

## 2020-06-09 DIAGNOSIS — R000 Tachycardia, unspecified: Secondary | ICD-10-CM

## 2020-06-09 LAB — BASIC METABOLIC PANEL
Anion gap: 8 (ref 5–15)
BUN: <5 mg/dL — ABNORMAL LOW (ref 6–20)
CO2: 21 mmol/L — ABNORMAL LOW (ref 22–32)
Calcium: 8.5 mg/dL — ABNORMAL LOW (ref 8.9–10.3)
Chloride: 98 mmol/L (ref 98–111)
Creatinine, Ser: 0.55 mg/dL (ref 0.44–1.00)
GFR calc Af Amer: >60 mL/min (ref >60)
GFR calc non Af Amer: >60 mL/min (ref >60)
Glucose, Bld: 101 mg/dL — ABNORMAL HIGH (ref 70–99)
Potassium: 3.7 mmol/L (ref 3.5–5.1)
Sodium: 127 mmol/L — ABNORMAL LOW (ref 135–145)

## 2020-06-09 NOTE — Progress Notes (Addendum)
[facility] PHYSICAL MEDICINE & REHABILITATION PROGRESS NOTE   Subjective/Complaints: Patient seen sitting up in her chair this morning.  She states she slept well overnight.  She states she had a tiring first day of therapies yesterday.  Mother with questions regarding therapies, progress, prognosis, labs, specialist follow-up.  ROS: Denies CP, SOB, N/V/D  Objective:   No results found. Recent Labs    06/08/20 1444  WBC 5.1  HGB 10.6*  HCT 33.9*  PLT 450*   Recent Labs    06/08/20 1444 06/09/20 0545  NA 130* 127*  K 3.9 3.7  CL 97* 98  CO2 21* 21*  GLUCOSE 114* 101*  BUN <5* <5*  CREATININE 0.72 0.55  CALCIUM 8.9 8.5*    Intake/Output Summary (Last 24 hours) at 06/09/2020 1210 Last data filed at 06/09/2020 0730 Gross per 24 hour  Intake 480 ml  Output --  Net 480 ml     Physical Exam: Vital Signs Blood pressure 121/71, pulse (!) 105, temperature 98.6 F (37 C), resp. rate 18, height 5\' 4"  (1.626 m), weight 77.9 kg, SpO2 98 %. Constitutional: No distress . Vital signs reviewed. HENT: Normocephalic.  Atraumatic. Eyes: EOMI. No discharge. Cardiovascular: No JVD.  + Tachycardia.  Regular rhythm. Respiratory: Normal effort.  No stridor.  Bilateral clear to auscultation. GI: Non-distended.  BS +. Skin: Warm and dry.  Intact. Psych: Normal mood.  Normal behavior. Musc: Lower extremity edema with some tenderness Neuro: Alert Motor: RUE- proximally 3+/5- in biceps, triceps and 4-/5 in WE/grip and finger abd, unchanged RLE- HF 3/5, KE/KF 3/5, DF 3+/5  Assessment/Plan: 1. Functional deficits secondary to acute left ischemic stroke, which require 3+ hours per day of interdisciplinary therapy in a comprehensive inpatient rehab setting.  Physiatrist is providing close team supervision and 24 hour management of active medical problems listed below.  Physiatrist and rehab team continue to assess barriers to discharge/monitor patient progress toward functional and  medical goals  Care Tool:  Bathing    Body parts bathed by patient: Right arm, Left arm, Chest, Abdomen, Front perineal area, Buttocks, Right upper leg, Left upper leg, Right lower leg, Left lower leg, Face (simulated due to pts mother reports she washed up this AM already)         Bathing assist Assist Level: Contact Guard/Touching assist     Upper Body Dressing/Undressing Upper body dressing   What is the patient wearing?: Pull over shirt    Upper body assist Assist Level: Supervision/Verbal cueing    Lower Body Dressing/Undressing Lower body dressing      What is the patient wearing?: Underwear/pull up, Pants     Lower body assist Assist for lower body dressing: Maximal Assistance - Patient 25 - 49%     Toileting Toileting    Toileting assist Assist for toileting: Minimal Assistance - Patient > 75%     Transfers Chair/bed transfer  Transfers assist     Chair/bed transfer assist level: Moderate Assistance - Patient 50 - 74%     Locomotion Ambulation   Ambulation assist      Assist level: 2 helpers Assistive device: Hand held assist Max distance: 30   Walk 10 feet activity   Assist     Assist level: 2 helpers Assistive device: No Device   Walk 50 feet activity   Assist Walk 50 feet with 2 turns activity did not occur: Safety/medical concerns         Walk 150 feet activity   Assist Walk 150  feet activity did not occur: Safety/medical concerns         Walk 10 feet on uneven surface  activity   Assist Walk 10 feet on uneven surfaces activity did not occur: Safety/medical concerns         Wheelchair     Assist Will patient use wheelchair at discharge?: No             Wheelchair 50 feet with 2 turns activity    Assist            Wheelchair 150 feet activity     Assist          Blood pressure 121/71, pulse (!) 105, temperature 98.6 F (37 C), resp. rate 18, height 5\' 4"  (1.626 m), weight 77.9  kg, SpO2 98 %.  Medical Problem List and Plan: 1.  R hemiparesis secondary to L MCA stroke due to RLE DVT/ B/L PE's and PFO- PFO to be addressed outpatient- s/p thrombectomy/SAH/IPH/IVH  Continue CIR 2.  B-PE/RLE DVT: Antithrombotics: -anticoagulation:  Pharmaceutical: Other (comment)--Eliquis             -antiplatelet therapy: N/a 3. Pain Management: Tylenol prn. Well controlled.  4. Mood: LCSW to follow for evaluation and support.              -antipsychotic agents: N/A 5. Neuropsych: This patient is not fully capable of making decisions on her own behalf. 6. Skin/Wound Care: Routine pressure relief measures.  7. Fluids/Electrolytes/Nutrition: Monitor I/Os.   8. Fevers: Inflammatory markers improving. Continues to have fevers--Tmax-100.4 last night. Felt to be due to clot burden/central. Will recheck UA/UCS to check for clearance.   ?  Resolved   UA unequivocal. Culture with insignificant growth.  9. SIADH/Hyponatremia: Added fluid restrictions. Will continue to monitor with daily checks.   Sodium 127 on 9/11, continue to monitor 10. HTN: Monitor BP tid--SBP goal <160.   Controlled on 9/11 11. Resting tachycardia: Multifactorial --continue to monitor with increase in activity.   Elevated, but?  Improving on 9/11 12. Anemia/thrombocytosis: Will continue to monitor   Hemoglobin 10.6 on 9/10, continue to monitor  Platelets 450 on 9/10, continue to monitor 13. Sick Euthyroid syndrome: Needs follow up thyroid panel in 4-6 weeks.  14. Occ urinary incontinence- common in stroke pts- will let staff know to help.  15.  Hypokalemia  Potassium 3.7 on 9/11, labs ordered for Monday  Supplement initiated   LOS: 2 days A FACE TO FACE EVALUATION WAS PERFORMED  Bev Drennen Tuesday 06/09/2020, 12:10 PM

## 2020-06-09 NOTE — Progress Notes (Signed)
Patient's mother concern about potassium levels and wanting to know why we are giving K med for days now. Mother wanting to speak with Dr. Allena Katz. MD notified.

## 2020-06-09 NOTE — IPOC Note (Signed)
Individualized overall Plan of Care [facility]) Patient Details Name: Gail Montgomery MRN: [id number] DOB: November 13, 1995  Admitting Diagnosis: Acute ischemic left MCA stroke [facility])  Hospital Problems: Principal Problem:   Acute ischemic left MCA stroke (HCC) Active Problems:   Hypokalemia   Acute blood loss anemia   Sinus tachycardia   SIADH (syndrome of inappropriate ADH production) (HCC)   Acute pulmonary embolism without acute cor pulmonale (HCC)     Functional Problem List: Nursing Endurance, Medication Management, Safety, Bladder, Bowel, Pain  PT Balance, Behavior, Endurance, Motor, Safety  OT Balance, Cognition, Endurance, Motor  SLP Cognition, Nutrition  TR         Basic ADL's: OT Grooming, Bathing, Dressing, Toileting     Advanced  ADL's: OT Simple Meal Preparation, Light Housekeeping     Transfers: PT Bed Mobility, Bed to Chair, Car, [employer], Civil Service fast streamer, Research scientist (life sciences): PT Ambulation, Psychologist, prison and probation services, Stairs     Additional Impairments: OT Fuctional Use of Upper Extremity  SLP Swallowing, Social Cognition, Communication expression Social Interaction, Problem Solving, Memory, Attention, Awareness  TR      Anticipated Outcomes Item Anticipated Outcome  Self Feeding independent  Swallowing  Mod I   Basic self-care  supervision  Toileting  supervision   Bathroom Transfers supervision  Bowel/Bladder  manage with mod I assist  Transfers  supervision  Locomotion  supervision  Communication  Mod I  Cognition  Supervision  Pain  Manage at or below level 4  Safety/Judgment  Maintain safety with cues/reminders   Therapy Plan: PT Intensity: Minimum of 1-2 x/day ,45 to 90 minutes PT Frequency: 5 out of 7 days PT Duration Estimated Length of Stay: 14-16 days OT Intensity: Minimum of 1-2 x/day, 45 to 90 minutes OT Frequency: 5 out of 7 days OT Duration/Estimated Length of Stay: 14-16 days SLP Intensity: Minumum of 1-2 x/day, 30 to 90  minutes SLP Frequency: 3 to 5 out of 7 days SLP Duration/Estimated Length of Stay: 14-16 days    Team Interventions: Nursing Interventions Patient/Family Education, Bowel Management, Discharge Planning, Medication Management, Disease Management/Prevention, Bladder Management  PT interventions Ambulation/gait training, DME/adaptive equipment instruction, Neuromuscular re-education, Psychosocial support, Stair training, UE/LE Strength taining/ROM, Wheelchair propulsion/positioning, Warden/ranger, Discharge planning, Therapeutic Activities, UE/LE Coordination activities, Cognitive remediation/compensation, Disease management/prevention, Functional mobility training, Patient/family education, Splinting/orthotics, Therapeutic Exercise  OT Interventions Balance/vestibular training, Discharge planning, Self Care/advanced ADL retraining, Therapeutic Activities, UE/LE Coordination activities, Cognitive remediation/compensation, Disease mangement/prevention, Functional mobility training, Patient/family education, Therapeutic Exercise, DME/adaptive equipment instruction, Neuromuscular re-education, Psychosocial support, UE/LE Strength taining/ROM  SLP Interventions Cognitive remediation/compensation, Internal/external aids, Therapeutic Activities, Environmental controls, Cueing hierarchy, Functional tasks, Patient/family education, Speech/Language facilitation  TR Interventions    SW/CM Interventions Discharge Planning, Psychosocial Support, Patient/Family Education   Barriers to Discharge MD  Medical stability  Nursing Decreased caregiver support 2 step entry to home  PT Behavior Impulsive, decreased safety awareness  OT      SLP      SW       Team Discharge Planning: Destination: PT-Home ,OT- Home , SLP-Home Projected Follow-up: PT-Outpatient PT, 24 hour supervision/assistance, OT-  24 hour supervision/assistance, SLP-Outpatient SLP, 24 hour supervision/assistance Projected Equipment  Needs: PT-To be determined, OT- To be determined, SLP-None recommended by SLP Equipment Details: PT- , OT-  Patient/family involved in discharge planning: PT- Family member/caregiver,  OT-Family member/caregiver, SLP-Patient, Family member/caregiver  MD ELOS: 12-15 days. Medical Rehab Prognosis:  Good Assessment: 24 year old RH-female with history of anxiety, recreational  marijuana use, on OCP who was admitted on 05/19/20 with sudden onset of right sided weakness and difficulty speaking.  Fully vaccinated a month ago but noted to be covid + at admission. CT head showed dense L-MCA and CTA head/neck showed emergent LVO at left ICA terminus continuing into L-MCA. She underwent cerebral angio with thrombectomy of L-MCA with flow . Post procedure CT head showed large SAH with ventricular extension and received TXA and cryoprecipitate to stop the bleeding. MRI brain showed early subacute infarct in left basal ganglia and SAH along interhemispheric fissure into lateral ventricles with early communicating hydrocephalus. No surgical intervention needed per Dr. Franky Macho and she was started on hypertonic saline to manage edema. On nimotop due to concerns of vasospasms. She self extubated on 08/22 and respiratory status stable/ no Covid symptoms.  Repeat CT head showed progression of hydrocephalus treated conservatively with nimodipine to keep SBP<160.  BLE dopplers negative for DVT. 2D echo showed EF 60-65% with increase in right atrial pressure and TCD showed evidence of PFO. Dr. Roda Shutters felt stroke embolic question ICA dissection v/s paradoxical emboli from PFO v/s Covid hyper oagulopathy.  She developed feverssT-max 103 on 08/25 and started on broad spectrum antibiotics 08/25-08/27 but continued to have fevers with HR occasionally up to 150's.  Work up negative and antibiotics d/c on 08/29 as symptoms felt to be central v/s Covid related.  Repeat dopplers 08/30 showed acute DVT right femoral, PV and GV. CTA chest done  showing bilateral segmental Left>Right PE with mild cardiomegaly. She was started on IV heparin and transitioned to Eliquis on 09/03.  Due to ongoing lethargy, EEG done showing generalized slowing lateralized to left hemisphere--no seizures. Ceftriaxone added for E coli UTI but continued to have fevers repeat BC 09/05--negative X 4 days. Has completed 3 day course of ceftriaxone for UTI--?ESBL. 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 Dr. Rennis Golden recommends closure in the future. Patient with resultant dysphagia--advanced to regulars, aphasia, right sided weakness and apraxia affecting ADL tasks as well as mobility.  We will set goals for supervision with PT/OT/SLP.  Due to the current state of emergency, patients may not be receiving their 3-hours of Medicare-mandated therapy.  See Team Conference Notes for weekly updates to the plan of care

## 2020-06-10 ENCOUNTER — Inpatient Hospital Stay (HOSPITAL_COMMUNITY): Payer: BC Managed Care – PPO

## 2020-06-10 ENCOUNTER — Inpatient Hospital Stay (HOSPITAL_COMMUNITY): Payer: BC Managed Care – PPO | Admitting: Speech Pathology

## 2020-06-10 DIAGNOSIS — E871 Hypo-osmolality and hyponatremia: Secondary | ICD-10-CM

## 2020-06-10 LAB — BASIC METABOLIC PANEL
Anion gap: 9 (ref 5–15)
BUN: <5 mg/dL — ABNORMAL LOW (ref 6–20)
CO2: 22 mmol/L (ref 22–32)
Calcium: 8.6 mg/dL — ABNORMAL LOW (ref 8.9–10.3)
Chloride: 100 mmol/L (ref 98–111)
Creatinine, Ser: 0.6 mg/dL (ref 0.44–1.00)
GFR calc Af Amer: >60 mL/min (ref >60)
GFR calc non Af Amer: >60 mL/min (ref >60)
Glucose, Bld: 108 mg/dL — ABNORMAL HIGH (ref 70–99)
Potassium: 3.7 mmol/L (ref 3.5–5.1)
Sodium: 131 mmol/L — ABNORMAL LOW (ref 135–145)

## 2020-06-10 NOTE — Progress Notes (Signed)
[name] PHYSICAL MEDICINE & REHABILITATION PROGRESS NOTE   Subjective/Complaints: Patient seen sitting up in her chair this morning.  She states she slept well overnight.  Mother at bedside.  Discussed PRAFO with patient/mother.   ROS: Denies CP, SOB, N/V/D  Objective:   No results found. Recent Labs    06/08/20 1444  WBC 5.1  HGB 10.6*  HCT 33.9*  PLT 450*   Recent Labs    06/09/20 0545 06/10/20 0614  NA 127* 131*  K 3.7 3.7  CL 98 100  CO2 21* 22  GLUCOSE 101* 108*  BUN <5* <5*  CREATININE 0.55 0.60  CALCIUM 8.5* 8.6*    Intake/Output Summary (Last 24 hours) at 06/10/2020 0835 Last data filed at 06/10/2020 0820 Gross per 24 hour  Intake 840 ml  Output --  Net 840 ml     Physical Exam: Vital Signs Blood pressure 108/68, pulse 97, temperature 98.7 F (37.1 C), temperature source Oral, resp. rate 18, height 5\' 4"  (1.626 m), weight 77.9 kg, SpO2 98 %. Constitutional: No distress . Vital signs reviewed. HENT: Normocephalic.  Atraumatic. Eyes: EOMI. No discharge. Cardiovascular: No JVD.  RRR. Respiratory: Normal effort.  No stridor.  Bilateral clear to auscultation. GI: Non-distended.  BS +. Skin: Warm and dry.  Intact. Psych: Normal mood.  Normal behavior. Musc: Lower extremity edema, improving.  No tenderness. Neuro: Alert Motor: RUE- proximally 3+/5 in biceps, triceps and 4-/5 in WE/grip and finger abd, stable RLE- HF 3 -/5, KE/KF 3 -/5, DF 3 -/5  Assessment/Plan: 1. Functional deficits secondary to acute left ischemic stroke, which require 3+ hours per day of interdisciplinary therapy in a comprehensive inpatient rehab setting.  Physiatrist is providing close team supervision and 24 hour management of active medical problems listed below.  Physiatrist and rehab team continue to assess barriers to discharge/monitor patient progress toward functional and medical goals  Care Tool:  Bathing    Body parts bathed by patient: Right arm, Left arm,  Chest, Abdomen, Front perineal area, Buttocks, Right upper leg, Left upper leg, Right lower leg, Left lower leg, Face (simulated due to pts mother reports she washed up this AM already)         Bathing assist Assist Level: Contact Guard/Touching assist     Upper Body Dressing/Undressing Upper body dressing   What is the patient wearing?: Pull over shirt    Upper body assist Assist Level: Supervision/Verbal cueing    Lower Body Dressing/Undressing Lower body dressing      What is the patient wearing?: Underwear/pull up     Lower body assist Assist for lower body dressing: Minimal Assistance - Patient > 75%     Toileting Toileting    Toileting assist Assist for toileting: Minimal Assistance - Patient > 75%     Transfers Chair/bed transfer  Transfers assist     Chair/bed transfer assist level: Moderate Assistance - Patient 50 - 74%     Locomotion Ambulation   Ambulation assist      Assist level: 2 helpers Assistive device: Hand held assist Max distance: 30   Walk 10 feet activity   Assist     Assist level: 2 helpers Assistive device: No Device   Walk 50 feet activity   Assist Walk 50 feet with 2 turns activity did not occur: Safety/medical concerns         Walk 150 feet activity   Assist Walk 150 feet activity did not occur: Safety/medical concerns  Walk 10 feet on uneven surface  activity   Assist Walk 10 feet on uneven surfaces activity did not occur: Safety/medical concerns         Wheelchair     Assist Will patient use wheelchair at discharge?: No             Wheelchair 50 feet with 2 turns activity    Assist            Wheelchair 150 feet activity     Assist          Blood pressure 108/68, pulse 97, temperature 98.7 F (37.1 C), temperature source Oral, resp. rate 18, height 5\' 4"  (1.626 m), weight 77.9 kg, SpO2 98 %.  Medical Problem List and Plan: 1.  R hemiparesis secondary to L  MCA stroke due to RLE DVT/ B/L PE's and PFO- PFO to be addressed outpatient- s/p thrombectomy/SAH/IPH/IVH  Continue CIR  PRAFO ordered qhs 2.  B-PE/RLE DVT: Antithrombotics: -anticoagulation:  Pharmaceutical: Other (comment)--Eliquis             -antiplatelet therapy: N/a 3. Pain Management: Tylenol prn. Well controlled.  4. Mood: LCSW to follow for evaluation and support.              -antipsychotic agents: N/A 5. Neuropsych: This patient is not fully capable of making decisions on her own behalf. 6. Skin/Wound Care: Routine pressure relief measures.  7. Fluids/Electrolytes/Nutrition: Monitor I/Os.   8. Fevers: Inflammatory markers improving. Continues to have fevers--Tmax-100.4 last night. Felt to be due to clot burden/central. Will recheck UA/UCS to check for clearance.   Appears to have resolved  UA unequivocal. Culture with insignificant growth.  9. SIADH/Hyponatremia: Added fluid restrictions. Will continue to monitor with daily checks.   Sodium 131 on 9/12 10. HTN: Monitor BP tid--SBP goal <160.   Controlled on 9/12 11. Resting tachycardia: Multifactorial --continue to monitor with increase in activity.   Improved on 9/12 12. Anemia/thrombocytosis: Will continue to monitor   Hemoglobin 10.6 on 9/10, continue to monitor  Platelets 450 on 9/10, continue to monitor 13. Sick Euthyroid syndrome: Needs follow up thyroid panel in 4-6 weeks.  14. Occ urinary incontinence- common in stroke pts- will let staff know to help.  15.  Hypokalemia  Potassium 3.7 on 9/12, labs ordered for tomorrow  Supplement initiated   LOS: 3 days A FACE TO FACE EVALUATION WAS PERFORMED  Gail Montgomery 11/12 06/10/2020, 8:35 AM

## 2020-06-10 NOTE — Progress Notes (Signed)
Occupational Therapy Session Note  Patient Details  Name: Gail Montgomery MRN: [id number] Date of Birth: 23-Apr-1996  Today's Date: 06/10/2020 OT Individual Time: 1003-1120 OT Individual Time Calculation (min): 77 min    Short Term Goals: Week 1:  OT Short Term Goal 1 (Week 1): Pt will complete toilet transfer with CGA OT Short Term Goal 2 (Week 1): Pt will complete shower transfer with min assist OT Short Term Goal 3 (Week 1): Pt will complete LB dressing with min assist OT Short Term Goal 4 (Week 1): Pt will complete LB bathing with CGA  Skilled Therapeutic Interventions/Progress Updates:    Pt received in recliner with no c/o pain, agreeable to OT session. Pt stated ADLs were already completed this morning. She donned shoes with mod A overall to slide heel in and was able to tie them herself. Pt completed stand pivot to the w/c with min A. She was taken to the therapy gym. She completed 2 trials of 25 ft of functional mobility, trailing with and without a RW. Min A overall for both. Pt completed 2 trials of functional reaching task in standing with focus on RUE [facility] and strength to squeeze clothespins. Min cueing throughout provided for attention to/use of RUE and positioning ( to encourage pronation especially). Pt completed 105 ft of functional mobility with her mother providing w/c follow without any AD and min A overall. 2 standing level rest breaks taken. Pt very excited re progress. Pt was provided with several handouts to independently work on handwriting in the room. Trialed use of lined paper to increase size of handwriting, this was not enough so also provided anchors via dots to begin and end letters. Pt's mother showed OT several things she had brought into room for pt to work on RUE [facility] and feedback was provided on best things to use. Also discussed ELOS and CLOF. Pt was left sitting in the recliner with all needs met.   Therapy Documentation Precautions:  Precautions Precautions:  Fall Precaution Comments: Impulsive; Right sided weakness (LE>UE) Restrictions Weight Bearing Restrictions: No  Therapy/Group: Individual Therapy  Curtis Sites 06/10/2020, 7:00 AM

## 2020-06-10 NOTE — Progress Notes (Signed)
Orthopedic Tech Progress Note Patient Details:  Gail Montgomery 1996-05-04 [id number] Ordered outside vendor brace Patient ID: [name], female   DOB: 09-08-1996, 24 y.o.   MRN: [id number]   [name] 06/10/2020, 2:23 PM

## 2020-06-10 NOTE — Progress Notes (Signed)
Speech Language Pathology Daily Session Note  Patient Details  Name: Gail Montgomery MRN: [id number] Date of Birth: 09/08/1996  Today's Date: 06/10/2020 SLP Individual Time: 9509-3267 SLP Individual Time Calculation (min): 58 min  Short Term Goals: Week 1: SLP Short Term Goal 1 (Week 1): Patient will consume current diet without overt s/s of aspiration and overall Mod I for use of swallowing compensatory strategies. SLP Short Term Goal 2 (Week 1): Patient will demonstrate sustained attention to functional tasks with Min verbal cus for redirection. SLP Short Term Goal 3 (Week 1): Patient will demonstrate functional problem solving for mildly complex tasks with Min A verbal cues. SLP Short Term Goal 4 (Week 1): Patient will recall new, daily information with Min verbal and visual cues for use of memory compensatory strategies. SLP Short Term Goal 5 (Week 1): Patient will utilize word-finding strategies at the conversation level with supervision level verbal cues.  Skilled Therapeutic Interventions: Pt was seen for skilled ST targeting cognitive goals. SLP facilitated with administration of MOCA version 7.1 as further diagnostic treatment of cognitive skills. Pt scored 20/30 (WNL 26 or >) indicative of mild higher level deficits, most noteworthy for impairments in executive function, attention, and abstract thinking. Pt also disoriented to city. Pt's mother present at bedside and reports she feels pt is close to cognitive baseline. She and pt both deny difficulty with word finding/verbal output. Given high cognitive demands on pt given schooling and job requirements, pt would benefit from skilled ST to address higher level problem solving, planning/organization, and attention. Pt in agreement. Pt left sitting in recliner with alarm set and needs within reach. Continue per current plan of care.          Pain Pain Assessment Pain Scale: 0-10 Pain Score: 0-No pain  Therapy/Group: Individual  Therapy  Gail Montgomery 06/10/2020, 12:08 PM

## 2020-06-10 NOTE — Progress Notes (Signed)
Physical Therapy Session Note  Patient Details  Name: Gail Montgomery MRN: [id number] Date of Birth: 01-13-1996  Today's Date: 06/10/2020 PT Individual Time: 1303-1400 PT Individual Time Calculation (min): 57 min   Short Term Goals: Week 1:  PT Short Term Goal 1 (Week 1): Pt will perform STS w/LRAD and cga PT Short Term Goal 2 (Week 1): Pt will ambulate 23ft w/LRAD and mod assist of 1 PT Short Term Goal 3 (Week 1): pt will transfer bed to/from wc w/min assist and LRAD PT Short Term Goal 4 (Week 1): Pt will demonstrate safe wc set up prior to STS and transfers w/min cues  Skilled Therapeutic Interventions/Progress Updates:     Pt received seated in recliner and agreeable to therapy. Reports slight pain in RLE. Number not provided. PT provides repositioning and rest breaks as needed to manage pain. Squat pivot transfer to WC with minA. WC transport to gym for time management. Stand pivot transfer to mat table with minA and PT blocking R knee. Pt performs NMR for standing balance and to encourage weight shifting to R and RLE control. Mirror positioned for visual feedback. Pt stands with minA and PT blocking R knee, and pt performs targeted stepping with LLE with use of latex colored numbered circles. Pt has occasional LOB backward during activity, requiring min/modA for safety. Pt does not buckle significantly through R knee, however, and is able to weight shift for functional stepping. Pt progresses to stepping onto 4 inch step with LLE for increased challenge, then alternates toe tapping with either LE. Pt has increased difficulty clearing step with RLE and picking up RLE to bring back to floor, frequently losing balance in the process. WC transport back to room. Stand pivot transfer to recliner with increased cuing on need to have both LEs touching surface before attempting to sit, due to pt's tendency to rush transfer. Pt left seated in recliner with all needs within reach.  Therapy  Documentation Precautions:  Precautions Precautions: Fall Precaution Comments: Impulsive; Right sided weakness (LE>UE) Restrictions Weight Bearing Restrictions: No   Therapy/Group: Individual Therapy  Beau Fanny, PT, DPT 06/10/2020, 4:13 PM

## 2020-06-11 ENCOUNTER — Inpatient Hospital Stay (HOSPITAL_COMMUNITY): Payer: BC Managed Care – PPO | Admitting: Physical Therapy

## 2020-06-11 ENCOUNTER — Inpatient Hospital Stay (HOSPITAL_COMMUNITY): Payer: BC Managed Care – PPO | Admitting: Speech Pathology

## 2020-06-11 ENCOUNTER — Inpatient Hospital Stay (HOSPITAL_COMMUNITY): Payer: BC Managed Care – PPO

## 2020-06-11 LAB — BASIC METABOLIC PANEL
Anion gap: 10 (ref 5–15)
BUN: <5 mg/dL — ABNORMAL LOW (ref 6–20)
CO2: 21 mmol/L — ABNORMAL LOW (ref 22–32)
Calcium: 8.6 mg/dL — ABNORMAL LOW (ref 8.9–10.3)
Chloride: 102 mmol/L (ref 98–111)
Creatinine, Ser: 0.55 mg/dL (ref 0.44–1.00)
GFR calc Af Amer: >60 mL/min (ref >60)
GFR calc non Af Amer: >60 mL/min (ref >60)
Glucose, Bld: 96 mg/dL (ref 70–99)
Potassium: 3.6 mmol/L (ref 3.5–5.1)
Sodium: 133 mmol/L — ABNORMAL LOW (ref 135–145)

## 2020-06-11 MED ORDER — ALUM & MAG HYDROXIDE-SIMETH 200-200-20 MG/5ML PO SUSP: 30.0000 mL | ORAL | 0 refills | Status: DC | PRN

## 2020-06-11 MED ORDER — ACETAMINOPHEN 325 MG PO TABS: 325.0000 mg | ORAL_TABLET | ORAL | Status: DC | PRN

## 2020-06-11 NOTE — Progress Notes (Signed)
Speech Language Pathology Daily Session Note  Patient Details  Name: Gail Montgomery MRN: [id number] Date of Birth: 1996-06-29  Today's Date: 06/11/2020 SLP Individual Time: 1300-1355 SLP Individual Time Calculation (min): 55 min  Short Term Goals: Week 1: SLP Short Term Goal 1 (Week 1): Patient will consume current diet without overt s/s of aspiration and overall Mod I for use of swallowing compensatory strategies. SLP Short Term Goal 2 (Week 1): Patient will demonstrate sustained attention to functional tasks with Min verbal cus for redirection. SLP Short Term Goal 3 (Week 1): Patient will demonstrate functional problem solving for mildly complex tasks with Min A verbal cues. SLP Short Term Goal 4 (Week 1): Patient will recall new, daily information with Min verbal and visual cues for use of memory compensatory strategies. SLP Short Term Goal 5 (Week 1): Patient will utilize word-finding strategies at the conversation level with supervision level verbal cues.  Skilled Therapeutic Interventions: Skilled treatment session focused on cognitive goals. SLP facilitated session by providing supervision level verbal cues for problem solving during a basic medication management task and Min A verbal and visual cues for organization and problem solving during a basic money management task. Patient's reading comprehension was [facility] at the paragraph level and patient attempted to write notes to maximize recall of information. However, patient's notes were difficult to read due to small size of writing. A larger pen and lined paper were unsuccessful in increasing the size. Patient left upright in recliner with alarm on and all needs within reach. Continue with current plan of care.      Pain No/Denies Pain   Therapy/Group: Individual Therapy  Eryca Bolte 06/11/2020, 3:10 PM

## 2020-06-11 NOTE — Progress Notes (Signed)
Physical Therapy Session Note  Patient Details  Name: Gail Montgomery MRN: [id number] Date of Birth: [birth year]  Today's Date: 06/11/2020 PT Individual Time: 1000-1120 PT Individual Time Calculation (min): 80 min   Short Term Goals: Week 1:  PT Short Term Goal 1 (Week 1): Pt will perform STS w/LRAD and cga PT Short Term Goal 2 (Week 1): Pt will ambulate 59ft w/LRAD and mod assist of 1 PT Short Term Goal 3 (Week 1): pt will transfer bed to/from wc w/min assist and LRAD PT Short Term Goal 4 (Week 1): Pt will demonstrate safe wc set up prior to STS and transfers w/min cues  Skilled Therapeutic Interventions/Progress Updates:     Patient in recliner with her mother in the room upon PT arrival. Patient alert and agreeable to PT session. Patient denied pain during session. Patient's mother asking about being cleared for transfers in the room. Focused first part of session on hands on training for transfers, then moved to the rehab gym to focus on gait training without an AD.   Therapeutic Activity: Bed Mobility: Patient performed supine to/from sit with min A for trunk and R LE management in a flat bed without use of bed rails. Provided verbal cues for practicing without use of hospital bed features to simulate home-set up as much as possible.  Transfers: Patient performed stand pivot transfers recliner>w/c, w/c<>bed, and w/c<>BSC with min A without AD. Provided cues for safe guarding technique in front of patient for safety.  Patient's mother performed the above bed mobility and transfers with the patient, follow cues and demonstrating safe technique, w/c set-up, and locking breaks prior to all transfers. Patient was continent of bowl and bladder during toileting and performed peri-care with supervision and LB clothing management with mod A.  She performed sit to/from stand x8 and stand pivot w/c<>mat table with CGA-close supervision. Provided verbal cues for backing up to seat and reaching back to sit  for safety.  Gait Training:  Patient ambulated 40 feet with min-mod A HHA with decreased R step length and stance time, and very narrow BOS. Noted patient with visible signs of anxiety during first trial. Patient reports having 1 fall in the bathroom since admission. Discussed circumstances of fall, related to R knee buckling and impulsivity, and focus on improving confidence with mobility to reduce risk of falls during therapy sessions. She then 15 feet forwards and backwards x4 in front of a mirror progressing from HHA to no AD from min-mod A to min A with facilitation for weight shift. Had increased posterior bias when first ambulating backwards, improved after NMR, see below. She ambulated 70 feet feet without AD with min A with improved BOS and equal step length and stance time at end of session.   Neuromuscular Re-ed: Patient performed the following standing balance activities for improved R LE motor control and balance in front of a mirror for visual support: -B step-taps on 7" step x10 without UE support, focused on R hip and knee flexion for foot placement when tapping with R and R lateral hip and knee stability and increased stance time when tapping with L, required min A for balance throughout -sit to stand without UE support x10 focused on equal weight bearing and forward weight shift -standing on red foam wedge x3 min focused on reduced posterior bias in standing with use of ankle strategies to maintain midline  Dicussed POC, PT goals, and barriers to d/c with patient and her mother at end of session. Plan to  d/c to parents home with level entry and goal for patient to ambulate without an AD at d/c. Patient's mother had several questions about stroke recovery and future surgery for PFO. Provided general education and educated on stroke risk factors and patient's age being an asset to her recovery. Differred detailed question about follow-up surgery to medical team. Encouraged patient and her  mother to focus on current recovery at this time.   Patient in recliner with her mother in the room at end of session with breaks locked and all needs within reach. Patient mother cleared for transfers bed<>chair and w/c<>toilet in the room. Educated on proper footwear for transfers and calling for assist as needed for safety. Patient and her mother in agreement. Safety sheet updated and nursing staff made aware.    Therapy Documentation Precautions:  Precautions Precautions: Fall Precaution Comments: Impulsive; Right sided weakness (LE>UE) Restrictions Weight Bearing Restrictions: No   Therapy/Group: Individual Therapy  Arron Mcnaught L Geovana Gebel PT, DPT  06/11/2020, 12:35 PM

## 2020-06-11 NOTE — Care Management (Signed)
[facility]patient Rehabilitation Center Individual Statement of Services  Patient Name:  Gail Montgomery  Date:  06/11/2020  Welcome to the Inpatient Rehabilitation Center.  Our goal is to provide you with an individualized program based on your diagnosis and situation, designed to meet your specific needs.  With this comprehensive rehabilitation program, you will be expected to participate in at least 3 hours of rehabilitation therapies Monday-Friday, with modified therapy programming on the weekends.  Your rehabilitation program will include the following services:  Physical Therapy (PT), Occupational Therapy (OT), Speech Therapy (ST), 24 hour per day rehabilitation nursing, Therapeutic Recreaction (TR), Psychology, Neuropsychology, Care Coordinator, Rehabilitation Medicine, Nutrition Services, Pharmacy Services and Other  Weekly team conferences will be held on Tuesdays to discuss your progress.  Your Inpatient Rehabilitation Care Coordinator will talk with you frequently to get your input and to update you on team discussions.  Team conferences with you and your family in attendance may also be held.  Expected length of stay: 14-16 days    Overall anticipated outcome: Supervision  Depending on your progress and recovery, your program may change. Your Inpatient Rehabilitation Care Coordinator will coordinate services and will keep you informed of any changes. Your Inpatient Rehabilitation Care Coordinator's name and contact numbers are listed  below.  The following services may also be recommended but are not provided by the Inpatient Rehabilitation Center:   Driving Evaluations  Home Health Rehabiltiation Services  Outpatient Rehabilitation Services  Vocational Rehabilitation   Arrangements will be made to provide these services after discharge if needed.  Arrangements include referral to agencies that provide these services.  Your insurance has been verified to be:  BCBS  Your primary doctor  is:  No PCP listed  Pertinent information will be shared with your doctor and your insurance company.  Inpatient Rehabilitation Care Coordinator:  Susie Cassette [phone] or ([phone]  Information discussed with and copy given to patient by: Gretchen Short, 06/11/2020, 9:05 AM

## 2020-06-11 NOTE — Progress Notes (Addendum)
Gail Montgomery PHYSICAL MEDICINE & REHABILITATION PROGRESS NOTE   Subjective/Complaints: Up in chair. Mother at bedside. No new complaints.   ROS: Patient denies fever, rash, sore throat, blurred vision, nausea, vomiting, diarrhea, cough, shortness of breath or chest pain, joint or back pain, headache, or mood change.    Objective:   No results found. Recent Labs    06/08/20 1444  WBC 5.1  HGB 10.6*  HCT 33.9*  PLT 450*   Recent Labs    06/10/20 0614 06/11/20 0658  NA 131* 133*  K 3.7 3.6  CL 100 102  CO2 22 21*  GLUCOSE 108* 96  BUN <5* <5*  CREATININE 0.60 0.55  CALCIUM 8.6* 8.6*    Intake/Output Summary (Last 24 hours) at 06/11/2020 1010 Last data filed at 06/11/2020 0900 Gross per 24 hour  Intake 960 ml  Output --  Net 960 ml     Physical Exam: Vital Signs Blood pressure 115/67, pulse (!) 105, temperature 98.4 F (36.9 C), resp. rate 14, height 5\' 4"  (1.626 m), weight 77.9 kg, SpO2 97 %. Constitutional: No distress . Vital signs reviewed. HEENT: EOMI, oral membranes moist Neck: supple Cardiovascular: RRR without murmur. No JVD    Respiratory/Chest: CTA Bilaterally without wheezes or rales. Normal effort    GI/Abdomen: BS +, non-tender, non-distended Ext: no clubbing, cyanosis, trace RLE edema Psych: pleasant and cooperative Musc: Lower extremity edema, improving.  No tenderness. Neuro: Alert and oriented x 3. Fair insight and awareness. Intact Memory.  Delayed speech. Some apraxia. Cranial nerve exam unremarkable  Motor: RUE- proximally 3+ to 4/5 in biceps, triceps and 4/5 in WE/grip and finger abd, stable RLE- HF 3-/5, KE/KF  3-/5, DF 3-/5. Senses pain in the right leg and arm as well as light touch.  Assessment/Plan: 1. Functional deficits secondary to acute left ischemic stroke, which require 3+ hours per day of interdisciplinary therapy in a comprehensive inpatient rehab setting.  Physiatrist is providing close team supervision and 24 hour management  of active medical problems listed below.  Physiatrist and rehab team continue to assess barriers to discharge/monitor patient progress toward functional and medical goals  Care Tool:  Bathing    Body parts bathed by patient: Right arm, Left arm, Chest, Abdomen, Front perineal area, Buttocks, Right upper leg, Left upper leg, Right lower leg, Left lower leg, Face         Bathing assist Assist Level: Contact Guard/Touching assist     Upper Body Dressing/Undressing Upper body dressing   What is the patient wearing?: Pull over shirt, Bra    Upper body assist Assist Level: Contact Guard/Touching assist    Lower Body Dressing/Undressing Lower body dressing      What is the patient wearing?: Underwear/pull up, Pants     Lower body assist Assist for lower body dressing: Minimal Assistance - Patient > 75%     Toileting Toileting    Toileting assist Assist for toileting: Contact Guard/Touching assist     Transfers Chair/bed transfer  Transfers assist     Chair/bed transfer assist level: Minimal Assistance - Patient > 75%     Locomotion Ambulation   Ambulation assist      Assist level: 2 helpers Assistive device: Hand held assist Max distance: 30   Walk 10 feet activity   Assist     Assist level: 2 helpers Assistive device: No Device   Walk 50 feet activity   Assist Walk 50 feet with 2 turns activity did not occur: Safety/medical concerns  Walk 150 feet activity   Assist Walk 150 feet activity did not occur: Safety/medical concerns         Walk 10 feet on uneven surface  activity   Assist Walk 10 feet on uneven surfaces activity did not occur: Safety/medical concerns         Wheelchair     Assist Will patient use wheelchair at discharge?: No             Wheelchair 50 feet with 2 turns activity    Assist            Wheelchair 150 feet activity     Assist          Blood pressure 115/67, pulse  (!) 105, temperature 98.4 F (36.9 C), resp. rate 14, height 5\' 4"  (1.626 m), weight 77.9 kg, SpO2 97 %.  Medical Problem List and Plan: 1.  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 Associated infarct in left BG  Continue CIR  PRAFO  qhs 2.  B-PE/RLE DVT: Antithrombotics: -anticoagulation:  Pharmaceutical: Other (comment)--Eliquis             -antiplatelet therapy: N/a 3. Pain Management: Tylenol prn. Well controlled.  4. Mood: LCSW to follow for evaluation and support.              -antipsychotic agents: N/A 5. Neuropsych: This patient is not fully capable of making decisions on her own behalf. 6. Skin/Wound Care: Routine pressure relief measures.  7. Fluids/Electrolytes/Nutrition: Monitor I/Os.    -eating well 8. Fevers: Inflammatory markers improving.   Felt to be due to clot burden/central.     Appears to have resolved  UA unequivocal. Culture with insignificant growth.  9. SIADH/Hyponatremia: Added fluid restrictions. Will continue to monitor with daily checks.   Sodium 133 on 9/13  -can gradually relax FR 10. HTN: Monitor BP tid--SBP goal <160.   Controlled on 9/13 11. Resting tachycardia: Multifactorial --continue to monitor with increase in activity.   Improving, still over 100 12. Anemia/thrombocytosis: Will continue to monitor   Hemoglobin 10.6 on 9/10, continue to monitor  Platelets 450 on 9/10, continue to monitor 13. Sick Euthyroid syndrome: Needs follow up thyroid panel in 4-6 weeks.  14. Occ urinary incontinence- appears to have resolved.  15.  Hypokalemia  Potassium 3.7 on 9/12, 3.6 9/13  Continue supplementation    LOS: 4 days A FACE TO FACE EVALUATION WAS PERFORMED  10/13 06/11/2020, 10:10 AM

## 2020-06-11 NOTE — Progress Notes (Signed)
Patient Details  Name: Gail Montgomery MRN: [id number] Date of Birth: 07-Feb-1996  Today's Date: 06/11/2020  Hospital Problems: Principal Problem:   Acute ischemic left MCA stroke [facility]) Active Problems:   Hypokalemia   Acute blood loss anemia   Sinus tachycardia   SIADH (syndrome of inappropriate ADH production) ([place])   Acute pulmonary embolism without acute cor pulmonale (HCC)   Hyponatremia  Past Medical History:  Past Medical History:  Diagnosis Date  . Anxiety   . Panic attack    Past Surgical History:  Past Surgical History:  Procedure Laterality Date  . BUBBLE STUDY  06/06/2020   Procedure: BUBBLE STUDY;  Surgeon: Pixie Casino, MD;  Location: [place];  Service: Cardiovascular;;  . IR CT HEAD LTD  05/19/2020  . IR CT HEAD LTD  05/19/2020  . IR PERCUTANEOUS ART THROMBECTOMY/INFUSION INTRACRANIAL INC DIAG ANGIO  05/19/2020  . IR US GUIDE VASC ACCESS RIGHT  05/19/2020  . RADIOLOGY WITH ANESTHESIA N/A 05/19/2020   Procedure: IR WITH ANESTHESIA;  Surgeon: Radiologist, Medication, MD;  Location: [place];  Service: Radiology;  Laterality: N/A;  . TEE WITHOUT CARDIOVERSION N/A 06/06/2020   Procedure: TRANSESOPHAGEAL ECHOCARDIOGRAM (TEE);  Surgeon: Pixie Casino, MD;  Location: [facility] ENDOSCOPY;  Service: Cardiovascular;  Laterality: N/A;   Social History:  reports that she has been smoking cigarettes. She has never used smokeless tobacco. She reports current alcohol use. She reports current drug use. Drug: Marijuana.  Family / Support Systems Marital Status: Single Spouse/Significant Other: N/A Children: N/A Other Supports: Mother Anticipated Caregiver: Mother Ability/Limitations of Caregiver: N/A Caregiver Availability: 24/7 Family Dynamics: Pt to d/c to her mother's home.  Social History Preferred language: English Religion:  Cultural Background: Pt is currently a full time student studying fashion at [employer]. Pt works part time. Pt lives on off-campus housing. Pt is also  self-employed as a Cabin crew. Education: [employer]- fashion major Read: Yes Write: Yes Employment Status: Employed Return to Work Plans: Would like to return when able Public relations account executive Issues: Denies Guardian/Conservator: N/A   Abuse/Neglect Abuse/Neglect Assessment Can Be Completed: Yes Physical Abuse: Denies Verbal Abuse: Denies Sexual Abuse: Denies Exploitation of patient/patient's resources: Denies Self-Neglect: Denies  Emotional Status Pt's affect, behavior and adjustment status: pt in good spirits at time of visit Recent Psychosocial Issues: admits to some anxiety related to pressures of school. Psychiatric History: denies Substance Abuse History: denies; admits to social Etoh  Patient / Family Perceptions, Expectations & Goals Pt/Family understanding of illness & functional limitations: Pt and pt mother have a general understandig of care needs at d/c, Premorbid pt/family roles/activities: Independent Anticipated changes in roles/activities/participation: Assistance with ADLs/IADLs  Education officer, environmental Agencies: None Premorbid Home Care/DME Agencies: None Transportation available at discharge: mother Resource referrals recommended: Neuropsychology  Discharge Planning Living Arrangements: Parent Support Systems: Parent Type of Residence: Private residence Insurance Resources: Multimedia programmer (specify) Nurse, mental health) Financial Resources: Employment, Family Support Financial Screen Referred: No Living Expenses: Own Money Management: Patient Does the patient have any problems obtaining your medications?: No Care Coordinator Barriers to Discharge: Decreased caregiver support, Lack of/limited family support Care Coordinator Anticipated Follow Up Needs: HH/OP Expected length of stay: 14-16 days  Clinical Impression SW met with pt and pt mother in room to introduce self, explain role, and discuss discharge process. Pt has no HCPOA; defer all decisions  to mother Gail Montgomery [phone]). Pt is not a English as a second language teacher. No DME. If outpatient, prefers [place] 06/11/2020, 4:34 PM

## 2020-06-11 NOTE — Progress Notes (Signed)
Occupational Therapy Session Note  Patient Details  Name: Gail Montgomery MRN: [id number] Date of Birth: November 12, 1995  Today's Date: 06/11/2020 OT Individual Time: 4383-7793 OT Individual Time Calculation (min): 60 min    Short Term Goals: Week 1:  OT Short Term Goal 1 (Week 1): Pt will complete toilet transfer with CGA OT Short Term Goal 2 (Week 1): Pt will complete shower transfer with min assist OT Short Term Goal 3 (Week 1): Pt will complete LB dressing with min assist OT Short Term Goal 4 (Week 1): Pt will complete LB bathing with CGA  Skilled Therapeutic Interventions/Progress Updates:    Pt received supine with no c/o pain, agreeable to OT session focused on ADLs at shower level. Pt completed bed mobility with (S) using bed features. Pt completed functional mobility into the bathroom with min HHA. She completed toileting, voiding BM and urine, requiring CGA for toileting tasks overall. Min cueing for sequencing doffing clothing. Min cueing for problem solving throughout session. Pt doffed clothing with min A for distal LE reaching. She transferred onto TTB in walk in shower with min A. Pt completed UB bathing with set up assist, good spontaneous use of RUE. CGA provided during sit <> stand for standing level peri cleansing. Pt donned sports bra with min A to pull down posteriorly, (S) for shirt. Cueing required for hemi technique when donning pants, min A overall. Pt able to use figure 4 method to don socks and shoes, and min A required to slide into heel of shoes. Pt completed grooming tasks at the sink with assist required to problem solve opening lotion bottle and to squirt onto hand. Pt transferred to recliner and was left sitting up with her mother present, all needs met.   Therapy Documentation Precautions:  Precautions Precautions: Fall Precaution Comments: Impulsive; Right sided weakness (LE>UE) Restrictions Weight Bearing Restrictions: No   Therapy/Group: Individual  Therapy  Curtis Sites 06/11/2020, 6:45 AM

## 2020-06-12 ENCOUNTER — Inpatient Hospital Stay (HOSPITAL_COMMUNITY): Payer: BC Managed Care – PPO | Admitting: Physical Therapy

## 2020-06-12 ENCOUNTER — Inpatient Hospital Stay (HOSPITAL_COMMUNITY): Payer: BC Managed Care – PPO | Admitting: Speech Pathology

## 2020-06-12 ENCOUNTER — Inpatient Hospital Stay (HOSPITAL_COMMUNITY): Payer: BC Managed Care – PPO

## 2020-06-12 LAB — BASIC METABOLIC PANEL
Anion gap: 8 (ref 5–15)
BUN: 5 mg/dL — ABNORMAL LOW (ref 6–20)
CO2: 24 mmol/L (ref 22–32)
Calcium: 8.9 mg/dL (ref 8.9–10.3)
Chloride: 104 mmol/L (ref 98–111)
Creatinine, Ser: 0.6 mg/dL (ref 0.44–1.00)
GFR calc Af Amer: >60 mL/min (ref >60)
GFR calc non Af Amer: >60 mL/min (ref >60)
Glucose, Bld: 93 mg/dL (ref 70–99)
Potassium: 4.3 mmol/L (ref 3.5–5.1)
Sodium: 136 mmol/L (ref 135–145)

## 2020-06-12 NOTE — Progress Notes (Signed)
[facility] PHYSICAL MEDICINE & REHABILITATION PROGRESS NOTE   Subjective/Complaints: Up in gym. No new complaints. Denies pain.   ROS: Patient denies fever, rash, sore throat, blurred vision, nausea, vomiting, diarrhea, cough, shortness of breath or chest pain, joint or back pain, headache, or mood change.    Objective:   No results found. No results for input(s): WBC, HGB, HCT, PLT in the last 72 hours. Recent Labs    06/10/20 0614 06/11/20 0658  NA 131* 133*  K 3.7 3.6  CL 100 102  CO2 22 21*  GLUCOSE 108* 96  BUN <5* <5*  CREATININE 0.60 0.55  CALCIUM 8.6* 8.6*    Intake/Output Summary (Last 24 hours) at 06/12/2020 1126 Last data filed at 06/11/2020 1839 Gross per 24 hour  Intake 600 ml  Output --  Net 600 ml     Physical Exam: Vital Signs Blood pressure 116/78, pulse (!) 105, temperature 98.2 F (36.8 C), temperature source Oral, resp. rate 16, height 5\' 4"  (1.626 m), weight 77.9 kg, SpO2 99 %. Constitutional: No distress . Vital signs reviewed. HEENT: EOMI, oral membranes moist Neck: supple Cardiovascular: RRR without murmur. No JVD    Respiratory/Chest: CTA Bilaterally without wheezes or rales. Normal effort    GI/Abdomen: BS +, non-tender, non-distended Ext: no clubbing, cyanosis, or edema Psych: pleasant and cooperative Musc: Lower extremity resolved.  No tenderness. Neuro: Alert and oriented x 3. Functional insight and awareness. Intact Memory.  Delayed speech improving. Some apraxia. Cranial nerve exam unremarkable  Motor: RUE- proximally 4-/5 in biceps, triceps and 4/5 in WE/grip and finger abd, stable RLE- HF 3- to 3/5, KE/KF  3- to 3/5, DF 3-/5. Senses pain in the right leg and arm as well as light touch.   Assessment/Plan: 1. Functional deficits secondary to acute left ischemic stroke, which require 3+ hours per day of interdisciplinary therapy in a comprehensive inpatient rehab setting.  Physiatrist is providing close team supervision and 24 hour  management of active medical problems listed below.  Physiatrist and rehab team continue to assess barriers to discharge/monitor patient progress toward functional and medical goals  Care Tool:  Bathing    Body parts bathed by patient: Right arm, Left arm, Chest, Abdomen, Front perineal area, Buttocks, Right upper leg, Left upper leg, Right lower leg, Left lower leg, Face         Bathing assist Assist Level: Contact Guard/Touching assist     Upper Body Dressing/Undressing Upper body dressing   What is the patient wearing?: Pull over shirt, Bra    Upper body assist Assist Level: Contact Guard/Touching assist    Lower Body Dressing/Undressing Lower body dressing      What is the patient wearing?: Underwear/pull up, Pants     Lower body assist Assist for lower body dressing: Minimal Assistance - Patient > 75%     Toileting Toileting    Toileting assist Assist for toileting: Contact Guard/Touching assist     Transfers Chair/bed transfer  Transfers assist     Chair/bed transfer assist level: Minimal Assistance - Patient > 75%     Locomotion Ambulation   Ambulation assist      Assist level: Minimal Assistance - Patient > 75% Assistive device: No Device Max distance: 194ft   Walk 10 feet activity   Assist     Assist level: Minimal Assistance - Patient > 75% Assistive device: No Device   Walk 50 feet activity   Assist Walk 50 feet with 2 turns activity did not occur:  Safety/medical concerns  Assist level: Minimal Assistance - Patient > 75% Assistive device: No Device    Walk 150 feet activity   Assist Walk 150 feet activity did not occur: Safety/medical concerns         Walk 10 feet on uneven surface  activity   Assist Walk 10 feet on uneven surfaces activity did not occur: Safety/medical concerns         Wheelchair     Assist Will patient use wheelchair at discharge?: No             Wheelchair 50 feet with 2 turns  activity    Assist            Wheelchair 150 feet activity     Assist          Blood pressure 116/78, pulse (!) 105, temperature 98.2 F (36.8 C), temperature source Oral, resp. rate 16, height 5\' 4"  (1.626 m), weight 77.9 kg, SpO2 99 %.  Medical Problem List and Plan: 1.  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 Associated infarct in left BG  Continue CIR, team conference today  PRAFO  qhs 2.  B-PE/RLE DVT: Antithrombotics: -anticoagulation:  Pharmaceutical: Other (comment)--Eliquis             -antiplatelet therapy: N/a 3. Pain Management: Tylenol prn. Well controlled.  4. Mood: LCSW to follow for evaluation and support.              -antipsychotic agents: N/A 5. Neuropsych: This patient is not fully capable of making decisions on her own behalf. 6. Skin/Wound Care: Routine pressure relief measures.  7. Fluids/Electrolytes/Nutrition: Monitor I/Os.    -eating well 8. Fevers: Inflammatory markers improving.   Felt to be due to clot burden/central.     Appears to have resolved  UA unequivocal. Culture with insignificant growth.  9. SIADH/Hyponatremia: Added fluid restrictions. Will continue to monitor with daily checks.   Sodium 133 on 9/13  - gradually relaxing FR 10. HTN: Monitor BP tid--SBP goal <160.   Controlled on 9/14 11. Resting tachycardia: Multifactorial --continue to monitor with increase in activity.   Improving, still over 100 12. Anemia/thrombocytosis: Will continue to monitor   Hemoglobin 10.6 on 9/10, continue to monitor  Platelets 450 on 9/10, continue to monitor 13. Sick Euthyroid syndrome: Needs follow up thyroid panel in 4-6 weeks.  14. Occ urinary incontinence- appears to have resolved.  15.  Hypokalemia  Potassium 3.7 on 9/12, 3.6 9/13  Continue supplementation    LOS: 5 days A FACE TO FACE EVALUATION WAS PERFORMED  10/13 06/12/2020, 11:26 AM

## 2020-06-12 NOTE — Progress Notes (Signed)
Patient ID: Gail Montgomery, female   DOB: 11/18/1995, 24 y.o.   MRN: [id number]  SW met with pt and pt mother in room today to provide updates from team conference, and d/c date 9/22. Discussion with regard to when she will be allowed to drive. SW informed continued therapies will support when it is appropriate. Pt was informed today that her classes have been placed on hold, and her off-campus housing apartment has been terminated. Pt very tearful. SW to follow-up with pt to check on mental health. SW informed pt and mother there will be follow-up on d/c recommendations.   Loralee Pacas, MSW, [place] Office: [phone] Cell: [phone] Fax: [phone]

## 2020-06-12 NOTE — Patient Care Conference (Signed)
Inpatient RehabilitationTeam Conference and Plan of Care Update Date: 06/12/2020   Time: 10:26 AM     Patient Name: Gail Montgomery      Medical Record Number: [id number]  Date of Birth: 03-Feb-1996 Sex: Female         Room/Bed: 4M11C/4M11C-01 Payor Info: Payor: [employer] / Plan: [facility] PPO / Product Type: *No Product type* /    Admit Date/Time:  06/07/2020  5:41 PM  Primary Diagnosis:  Acute ischemic left MCA stroke [facility])  Hospital Problems: Principal Problem:   Acute ischemic left MCA stroke (HCC) Active Problems:   Hypokalemia   Acute blood loss anemia   Sinus tachycardia   SIADH (syndrome of inappropriate ADH production) (HCC)   Acute pulmonary embolism without acute cor pulmonale (HCC)   Hyponatremia    Expected Discharge Date: Expected Discharge Date: 06/20/20  Team Members Present: Physician leading conference: Dr. Faith Rogue Care Coodinator Present: Cecile Sheerer, LCSWA;Maxmilian Trostel Marlyne Beards, RN, BSN, CRRN Nurse Present: Other (comment) Eulah Citizen, RN) PT Present: Karolee Stamps, PT OT Present: Jake Shark, OT SLP Present: Feliberto Gottron, SLP PPS Coordinator present : Edson Snowball, Park Breed, SLP     Current Status/Progress Goal Weekly Team Focus  Bowel/Bladder   continent of bowel and bladder. LBM 9/11  remain continent  assess q shift/prn   Swallow/Nutrition/ Hydration   Regular textures with thin liquids, Intermittent supervision-mod I  Mod I  Self-monitoring and correcting right buccal pocketing   ADL's   ADLs- (S) UB, min A LB. Min A- CGA transfers without AD. RUE functional , higher level deficits in handwriting and [facility]  Supervision overall  ADL retaining, higher level FMC, ADL transfers,   Mobility   Min A bed mobility, supervision-CGA transfers, min A gait 70 ft without AD, max A stairs  Supervision overall  Gait training, activity tolerance, balance, R UE/LE NMR/strengthening, safety awareness, patient/caregiver education    Communication   Supervision  Mod I  higher-level word-findng   Safety/Cognition/ Behavioral Observations  Supervision-Min A  Supervision  complex problem solving, recall and attention   Pain   no c/o pain  remain pain free  assess q shift/prn   Skin   skin dry and intact  remain free of breakdown/infection  assess q shift/prn     Discharge Planning:  Pt to d/c to her mother's home who will provide 24/7 care to pt.   Team Discussion: Continent B/B. Supervision upper body, min assist lower body. Supervision/min assist overall goals. SLP working on higher level problem solving. Patient on target to meet rehab goals: yes  *See Care Plan and progress notes for long and short-term goals.   Revisions to Treatment Plan:  None at this time.  Teaching Needs: Continue with family education.  Current Barriers to Discharge: No barriers noted at this time.  Possible Resolutions to Barriers: N/A     Medical Summary Current Status: left MCA infarct d/t? ICA dissection after PFO/DVT, mulitple associated complications. progressing very nicely. RLE>RUE weakness  Barriers to Discharge: Medical stability   Possible Resolutions to Barriers/Weekly Focus: daily lab review and clinical assessment.   Continued Need for Acute Rehabilitation Level of Care: The patient requires daily medical management by a physician with specialized training in physical medicine and rehabilitation for the following reasons: Direction of a multidisciplinary physical rehabilitation program to maximize functional independence : Yes Medical management of patient stability for increased activity during participation in an intensive rehabilitation regime.: Yes Analysis of laboratory values and/or radiology  reports with any subsequent need for medication adjustment and/or medical intervention. : Yes   I attest that I was present, lead the team conference, and concur with the assessment and plan of the  team.   Tennis Must 06/12/2020, 1:38 PM

## 2020-06-12 NOTE — Progress Notes (Signed)
Occupational Therapy Session Note  Patient Details  Name: Gail Montgomery MRN: [id number] Date of Birth: [birth year]  Today's Date: 06/12/2020 OT Individual Time: 6286-3817 OT Individual Time Calculation (min): 45 min    Short Term Goals: Week 1:  OT Short Term Goal 1 (Week 1): Pt will complete toilet transfer with CGA OT Short Term Goal 2 (Week 1): Pt will complete shower transfer with min assist OT Short Term Goal 3 (Week 1): Pt will complete LB dressing with min assist OT Short Term Goal 4 (Week 1): Pt will complete LB bathing with CGA  Skilled Therapeutic Interventions/Progress Updates:    Pt received sleeping, easily awoken and agreeable to OT session. ADLs completed this morning. Pt completed ambulatory transfer around room with min HHA into bathroom. Pt completed all toileting tasks with CGA. Pt transferred back to w/c with CGA. Pt was taken to the therapy gym. She completed 2x functional stepping activity to simulate home walk in shower threshold. Pt with 50% accuracy, stepping on barrier and requiring min A for balance correction. Pt required cueing for positioning of RUE, often bringing hand and elbow into flexion with concentration. Pt completed agility ladder with focus on dynamic stepping through large squares with CGA. Pt completed lateral stepping in agility ladder with CGA. Pt cued to increase R hip extension and reduce trunk rotation compensation. Pt completed 2 sets 10 of supine hip bridges to improve glute and posterior chain strengthening. Pt returned to her w/c and was left sitting up in the recliner with all needs met, mother present.   Therapy Documentation Precautions:  Precautions Precautions: Fall Precaution Comments: Impulsive; Right sided weakness (LE>UE) Restrictions Weight Bearing Restrictions: No  Therapy/Group: Individual Therapy  Curtis Sites 06/12/2020, 6:58 AM

## 2020-06-12 NOTE — Progress Notes (Signed)
Speech Language Pathology Daily Session Note  Patient Details  Name: Gail Montgomery MRN: [id number] Date of Birth: 1996-02-16  Today's Date: 06/12/2020 SLP Individual Time: 1400-1455 SLP Individual Time Calculation (min): 55 min  Short Term Goals: Week 1: SLP Short Term Goal 1 (Week 1): Patient will consume current diet without overt s/s of aspiration and overall Mod I for use of swallowing compensatory strategies. SLP Short Term Goal 2 (Week 1): Patient will demonstrate sustained attention to functional tasks with Min verbal cus for redirection. SLP Short Term Goal 3 (Week 1): Patient will demonstrate functional problem solving for mildly complex tasks with Min A verbal cues. SLP Short Term Goal 4 (Week 1): Patient will recall new, daily information with Min verbal and visual cues for use of memory compensatory strategies. SLP Short Term Goal 5 (Week 1): Patient will utilize word-finding strategies at the conversation level with supervision level verbal cues.  Skilled Therapeutic Interventions: Skilled treatment session focused on cognitive goals. SLP facilitated session by providing overall Min A verbal and visual cues for complex problem solving and organization during a scheduling task. Patient initiated conversation regarding withdrawing from classes this semester and moving in with her mother. SLP provided emotional support and encouragement as well as education regarding ongoing assistance needed at home post discharge. Patient verbalized understanding of all information. Patient left upright in wheelchair with all needs within reach and mother present. Continue with current plan of care.      Pain No/Denies Pain   Therapy/Group: Individual Therapy  [name] 06/12/2020, 3:26 PM

## 2020-06-12 NOTE — Progress Notes (Signed)
Physical Therapy Session Note  Patient Details  Name: Gail Montgomery MRN: [id number] Date of Birth: 02-Dec-1995  Today's Date: 06/12/2020 PT Individual Time: 2505-3976 PT Individual Time Calculation (min): 42 min   Short Term Goals: Week 1:  PT Short Term Goal 1 (Week 1): Pt will perform STS w/LRAD and cga PT Short Term Goal 2 (Week 1): Pt will ambulate 43ft w/LRAD and mod assist of 1 PT Short Term Goal 3 (Week 1): pt will transfer bed to/from wc w/min assist and LRAD PT Short Term Goal 4 (Week 1): Pt will demonstrate safe wc set up prior to STS and transfers w/min cues  Skilled Therapeutic Interventions/Progress Updates:  Pt received in bed with mother present in room. Pt completes bed mobility with supervision with hospital bed features & dons shoes with mother ensuring R shoe is donned properly. Pt reports need to use restroom & pt ambulates in room/bathroom without AD & min assist. Pt completes toilet transfer with CGA/supervision & manages clothing without assistance; pt with continent void on toilet & performs peri hygiene without assist. Pt performs hand hygiene with min assist for standing balance. Transported pt to/from gym via w/c dependent assist for time management. Pt engages in stepping over cones with focus on increased hip flexion & object clearance to simulate walk in shower. Pt performs standing RLE hip extension with 2.5# ankle weight & PT providing instructional cuing & multimodal cuing for proper technique with task focusing on strengthening. Pt propels w/c with BLE with demo & cuing with focus on RLE posterior chain strengthening. Kinetron in standing with BUE support & cuing for upright posture with focus on RLE terminal knee extension in stance phase, dynamic balance & weight shifting L<>R, and R NMR. At end of session pt left in room with mother present to supervise, call bell in lap.  Denies pain  Therapy Documentation Precautions:  Precautions Precautions:  Fall Precaution Comments: Impulsive; Right sided weakness (LE>UE) Restrictions Weight Bearing Restrictions: No    Therapy/Group: Individual Therapy  Sandi Mariscal 06/12/2020, 2:01 PM

## 2020-06-12 NOTE — Progress Notes (Signed)
Physical Therapy Session Note  Patient Details  Name: Gail Montgomery MRN: [id number] Date of Birth: 1996-01-24  Today's Date: 06/12/2020 PT Individual Time: 0805-0900 PT Individual Time Calculation (min): 55 min   Short Term Goals: Week 1:  PT Short Term Goal 1 (Week 1): Pt will perform STS w/LRAD and cga PT Short Term Goal 2 (Week 1): Pt will ambulate 14ft w/LRAD and mod assist of 1 PT Short Term Goal 3 (Week 1): pt will transfer bed to/from wc w/min assist and LRAD PT Short Term Goal 4 (Week 1): Pt will demonstrate safe wc set up prior to STS and transfers w/min cues  Skilled Therapeutic Interventions/Progress Updates:   Pt received sitting in recliner with her mom present and pt agreeable to therapy session. R stand pivot to w/c, no AD, with CGA/min assist using armrests. Transported to/from gym in w/c for time management and energy conservation. Gait training 181ft, no AD, with min assist for balance - demos ability to perform swing and stance phases of gait without assist but does have decreased R weight shift during stance and decreased R hip/knee flexion and ankle DF during swing. R LE NMR focusing on stance phase via repeated sit<>stands, no UE support, 2x10 reps with min assist for balance - mirror feedback and tactile cuing to maintain midline weight shift. Gait training ~151ft, no AD, with min assist for balance - demos improving gait mechanics but maintains above impairments - cuing for relaxed UE positioning and increased arm swing. MD in/out for morning assessment. Seated R LE ankle DF stretch - therapist printed this exercise along with seated active ankle DF for pt to perform outside of therapy session. R LE NMR focusing on swing phase via forward heel taps on 8" step to external targets - 2x10 reps with min assist for balance. R LE NMR focusing on swing phase via lateral foot taps on 8" step without UE support min assist for balance - x6 then x10 reps. Throughout session educated pt on  proper set-up for sit<>stands with correct R LE positioning while maintaining midline weight shift and decreased L hemibody compensation. Repeated R LE step up/down on/off 1st 6" step at stairs with R UE support on handrail and min assist for balance x10 reps - able to place R foot on/off step without assist. Transported part of the way back to the room in w/c. Gait training ~20ft remainder of distance, no AD, with min assist for balance - demos same impairments as above though improving. Pt left seated in recliner with needs in reach and her mom present.  Therapy Documentation Precautions:  Precautions Precautions: Fall Precaution Comments: Impulsive; Right sided weakness (LE>UE) Restrictions Weight Bearing Restrictions: No  Pain:  Denies pain during session but does report some "foot" pain from wearing the PRAFO last night - discussed with MD and educated pt on performing gastroc/soleus stretch during day outside of therapy sessions.   Therapy/Group: Individual Therapy  Ginny Forth , PT, DPT, CSRS  06/12/2020, 7:43 AM

## 2020-06-13 ENCOUNTER — Telehealth: Payer: Self-pay

## 2020-06-13 ENCOUNTER — Inpatient Hospital Stay (HOSPITAL_COMMUNITY): Payer: BC Managed Care – PPO | Admitting: Speech Pathology

## 2020-06-13 ENCOUNTER — Inpatient Hospital Stay (HOSPITAL_COMMUNITY): Payer: BC Managed Care – PPO

## 2020-06-13 LAB — BASIC METABOLIC PANEL
Anion gap: 8 (ref 5–15)
BUN: <5 mg/dL — ABNORMAL LOW (ref 6–20)
CO2: 27 mmol/L (ref 22–32)
Calcium: 9.5 mg/dL (ref 8.9–10.3)
Chloride: 104 mmol/L (ref 98–111)
Creatinine, Ser: 0.62 mg/dL (ref 0.44–1.00)
GFR calc Af Amer: >60 mL/min (ref >60)
GFR calc non Af Amer: >60 mL/min (ref >60)
Glucose, Bld: 104 mg/dL — ABNORMAL HIGH (ref 70–99)
Potassium: 3.5 mmol/L (ref 3.5–5.1)
Sodium: 139 mmol/L (ref 135–145)

## 2020-06-13 NOTE — Progress Notes (Signed)
Patient ID: Gail Montgomery, female   DOB: Jun 05, 1996, 24 y.o.   MRN: [id number]  SW faxed outpatient PT/OT/SLP order to [facility] (p:[phone]/f:[phone]). *SW confirms referral received. SW provided contact information for pt mother for contact.   Cecile Sheerer, MSW, LCSWA Office: [phone] Cell: [phone] Fax: [phone]

## 2020-06-13 NOTE — Progress Notes (Signed)
Speech Language Pathology Daily Session Note  Patient Details  Name: Joelyn Lover MRN: [id number] Date of Birth: 05/06/1996  Today's Date: 06/13/2020 SLP Individual Time: 1100-1205 SLP Individual Time Calculation (min): 65 min  Short Term Goals: Week 1: SLP Short Term Goal 1 (Week 1): Patient will consume current diet without overt s/s of aspiration and overall Mod I for use of swallowing compensatory strategies. SLP Short Term Goal 2 (Week 1): Patient will demonstrate sustained attention to functional tasks with Min verbal cus for redirection. SLP Short Term Goal 3 (Week 1): Patient will demonstrate functional problem solving for mildly complex tasks with Min A verbal cues. SLP Short Term Goal 4 (Week 1): Patient will recall new, daily information with Min verbal and visual cues for use of memory compensatory strategies. SLP Short Term Goal 5 (Week 1): Patient will utilize word-finding strategies at the conversation level with supervision level verbal cues.  Skilled Therapeutic Interventions: Skilled treatment session focused on cognitive goals. SLP facilitated session by providing overall supervision level verbal cues for complex problem solving during a medication management task. SLP also facilitated session by providing extra time and overall supervision level verbal cues for word-finding during a complex verbal description task at the sentence level. Patient transferred to the recliner at end of session and left with all needs within reach. Continue with current plan of care.      Pain No/Denies Pain   Therapy/Group: Individual Therapy  Mardy Hoppe 06/13/2020, 1:02 PM

## 2020-06-13 NOTE — Progress Notes (Signed)
Occupational Therapy Session Note  Patient Details  Name: Gail Montgomery MRN: [id number] Date of Birth: [birth year]  Today's Date: 06/13/2020 OT Individual Time: 1300-1415 OT Individual Time Calculation (min): 75 min    Short Term Goals: Week 1:  OT Short Term Goal 1 (Week 1): Pt will complete toilet transfer with CGA OT Short Term Goal 2 (Week 1): Pt will complete shower transfer with min assist OT Short Term Goal 3 (Week 1): Pt will complete LB dressing with min assist OT Short Term Goal 4 (Week 1): Pt will complete LB bathing with CGA  Skilled Therapeutic Interventions/Progress Updates:    1:1. Pt received in recliner agreeable ot OT and mother present for family education to review bathing process and shower transfer. Pt and mother completes shower with min VC for w/c positioning, lateral lean use in seated, safety aditions for shower at home, impulsivity and R hemi dressing/energy conservation. Mother able to demo good safety awareness of w/c brakes, providing CGA for standing balance during LB dressing and requires VC for not assisting pt with dressing steps. Pt only needs steadying A for dressing and VC for R limb threading first. Pt completes game of uno targeting RUE reaching and L thumb shift skill over 2 games with forced use of RUE. Exited sessionw iht pt seated in bed, exit alarm on and call light tin reac  Therapy Documentation Precautions:  Precautions Precautions: Fall Precaution Comments: Impulsive; Right sided weakness (LE>UE) Restrictions Weight Bearing Restrictions: No General:   Vital Signs:   Pain:   ADL: ADL Where Assessed-Eating: Wheelchair Grooming: Setup Where Assessed-Grooming: Sitting at sink Upper Body Dressing: Setup Where Assessed-Upper Body Dressing: Sitting at sink Toilet Transfer: Minimal assistance Toilet Transfer Method: Stand pivot Acupuncturist: Bedside commode Vision   Perception    Praxis   Exercises:   Other  Treatments:     Therapy/Group: Individual Therapy  Shon Hale 06/13/2020, 12:48 PM

## 2020-06-13 NOTE — Progress Notes (Signed)
Physical Therapy Session Note  Patient Details  Name: Gail Montgomery MRN: [id number] Date of Birth: [birth year]  Today's Date: 06/13/2020 PT Individual Time: 1000-1100 PT Individual Time Calculation (min): 60 min   Short Term Goals: Week 1:  PT Short Term Goal 1 (Week 1): Pt will perform STS w/LRAD and cga PT Short Term Goal 2 (Week 1): Pt will ambulate 35ft w/LRAD and mod assist of 1 PT Short Term Goal 3 (Week 1): pt will transfer bed to/from wc w/min assist and LRAD PT Short Term Goal 4 (Week 1): Pt will demonstrate safe wc set up prior to STS and transfers w/min cues Week 2:    Week 3:     Skilled Therapeutic Interventions/Progress Updates:    PAIN denies pain  Pt seen this am for session w/focus on RLE NMRE/stabilization activities Mother present and assisting initially by following w/wc  Supine to sit to stand w/supervision. Gait >118ft no AD and min to cga of 1, mild wobbles R knee w/loading to midstance, mild decreased hip and knee flexion thru swing. HR 125, 02 sats 100%  Sit tstand to tall kneeling w/min assist. In tall kneeling performed alternating isometrics at trunk to promote hip/core stabilitiy. HR 130, 02 sats 100%  Recovers in sitting x 3-4 min, hr returns to 166-120 at rest.   Sit to stand to quadurped w/cga to min assist. Quadruped alternating arm raises, then alternating leg raises 3-5 reps each.   Hr 128-130, 02 sats 100 Repeated quadruped activity x 3 rounds w/seated rest between.  Standing balance: Standing on foam eyes open/closed, cervical nods and rotation all w/close supervision. Alternating armswing on foam w/cues to complete rom RUE and close supervision bilat arm raises w/cga w/mild post tendency.  Standing  Tapping cones placed in 1/2 floorclock pattern 5 cones performed w/bilat LEs for RLE coordination and RLE stability challenge,  Repeated x 2 prior to seated rest.  Activity repeated x 3.  At end of session, pt transferred to wc and handed  off to OT for next session.    Therapy Documentation Precautions:  Precautions Precautions: Fall Precaution Comments: Impulsive; Right sided weakness (LE>UE) Restrictions Weight Bearing Restrictions: No    Therapy/Group: Individual Therapy  Rada Hay, PT   Shearon Balo 06/13/2020, 12:44 PM

## 2020-06-13 NOTE — Progress Notes (Signed)
[place] PHYSICAL MEDICINE & REHABILITATION PROGRESS NOTE   Subjective/Complaints: Slept well. Mom in room with her. No new complaints today  ROS: Patient denies fever, rash, sore throat, blurred vision, nausea, vomiting, diarrhea, cough, shortness of breath or chest pain, joint or back pain, headache, or mood change.   Objective:   No results found. No results for input(s): WBC, HGB, HCT, PLT in the last 72 hours. Recent Labs    06/11/20 0658 06/12/20 1731  NA 133* 136  K 3.6 4.3  CL 102 104  CO2 21* 24  GLUCOSE 96 93  BUN <5* 5*  CREATININE 0.55 0.60  CALCIUM 8.6* 8.9    Intake/Output Summary (Last 24 hours) at 06/13/2020 0818 Last data filed at 06/12/2020 1315 Gross per 24 hour  Intake 462 ml  Output --  Net 462 ml     Physical Exam: Vital Signs Blood pressure 121/71, pulse 93, temperature 98.9 F (37.2 C), resp. rate 18, height 5\' 4"  (1.626 m), weight 77.9 kg, SpO2 99 %. Constitutional: No distress . Vital signs reviewed. HEENT: EOMI, oral membranes moist Neck: supple Cardiovascular: RRR without murmur. No JVD    Respiratory/Chest: CTA Bilaterally without wheezes or rales. Normal effort    GI/Abdomen: BS +, non-tender, non-distended Ext: no clubbing, cyanosis, or edema Psych: pleasant and cooperative Musc: Lower extremity resolved.  No tenderness. Neuro: Alert and oriented x 3. Functional insight and awareness. Intact Memory.  Delayed speech improving. Some apraxia. Cranial nerve exam unremarkable  Motor: RUE- proximally 4-/5 in biceps, triceps and 4/5 in WE/grip and finger abd, decreased FMC.  RLE- HF 3- to 3/5, KE/KF  3- to 3/5, DF 3-/5. Senses pain in the right leg and arm as well as light touch--no changes.   Assessment/Plan: 1. Functional deficits secondary to acute left ischemic stroke, which require 3+ hours per day of interdisciplinary therapy in a comprehensive inpatient rehab setting.  Physiatrist is providing close team supervision and 24 hour  management of active medical problems listed below.  Physiatrist and rehab team continue to assess barriers to discharge/monitor patient progress toward functional and medical goals  Care Tool:  Bathing    Body parts bathed by patient: Right arm, Left arm, Chest, Abdomen, Front perineal area, Buttocks, Right upper leg, Left upper leg, Right lower leg, Left lower leg, Face         Bathing assist Assist Level: Contact Guard/Touching assist     Upper Body Dressing/Undressing Upper body dressing   What is the patient wearing?: Pull over shirt, Bra    Upper body assist Assist Level: Contact Guard/Touching assist    Lower Body Dressing/Undressing Lower body dressing      What is the patient wearing?: Underwear/pull up, Pants     Lower body assist Assist for lower body dressing: Minimal Assistance - Patient > 75%     Toileting Toileting    Toileting assist Assist for toileting: Contact Guard/Touching assist     Transfers Chair/bed transfer  Transfers assist     Chair/bed transfer assist level: Minimal Assistance - Patient > 75%     Locomotion Ambulation   Ambulation assist      Assist level: Minimal Assistance - Patient > 75% Assistive device: No Device Max distance: 15 ft   Walk 10 feet activity   Assist     Assist level: Minimal Assistance - Patient > 75% Assistive device: No Device   Walk 50 feet activity   Assist Walk 50 feet with 2 turns activity did not  occur: Safety/medical concerns  Assist level: Minimal Assistance - Patient > 75% Assistive device: No Device    Walk 150 feet activity   Assist Walk 150 feet activity did not occur: Safety/medical concerns         Walk 10 feet on uneven surface  activity   Assist Walk 10 feet on uneven surfaces activity did not occur: Safety/medical concerns         Wheelchair     Assist Will patient use wheelchair at discharge?: No Type of Wheelchair: Manual    Wheelchair assist  level: Supervision/Verbal cueing Max wheelchair distance: 75 ft    Wheelchair 50 feet with 2 turns activity    Assist        Assist Level: Supervision/Verbal cueing   Wheelchair 150 feet activity     Assist          Blood pressure 121/71, pulse 93, temperature 98.9 F (37.2 C), resp. rate 18, height 5\' 4"  (1.626 m), weight 77.9 kg, SpO2 99 %.  Medical Problem List and Plan: 1.  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 Associated infarct in left BG  Continue CIR  PRAFO  qhs 2.  B-PE/RLE DVT: Antithrombotics: -anticoagulation:  Pharmaceutical: Other (comment)--Eliquis             -antiplatelet therapy: N/a 3. Pain Management: Tylenol prn. Well controlled.  4. Mood: LCSW to follow for evaluation and support.              -antipsychotic agents: N/A 5. Neuropsych: This patient is not fully capable of making decisions on her own behalf. 6. Skin/Wound Care: Routine pressure relief measures.  7. Fluids/Electrolytes/Nutrition: Monitor I/Os.    -eating well 8. Fevers: Inflammatory markers improving.   Felt to be due to clot burden/central.      resolved  UA unequivocal. Culture with insignificant growth.  9. SIADH/Hyponatremia: Added fluid restrictions. Will continue to monitor with daily checks.   Sodium 133 on 9/13  - gradually relaxing FR--recheck Friday 10. HTN: Monitor BP tid--SBP goal <160.   Controlled on 9/15 11. Resting tachycardia: Multifactorial --continue to monitor with increase in activity.   Improving, 90's to 100 12. Anemia/thrombocytosis: Will continue to monitor   Hemoglobin 10.6 on 9/10, continue to monitor  Platelets 450 on 9/10, continue to monitor 13. Sick Euthyroid syndrome: Needs follow up thyroid panel in 4-6 weeks.  14. Occ urinary incontinence- appears to have resolved.  15.  Hypokalemia  Potassium 3.7 on 9/12, 3.6 9/13  Continue supplementation---recheck  Friday    LOS: 6 days A FACE TO FACE EVALUATION WAS PERFORMED  Friday 06/13/2020, 8:18 AM

## 2020-06-13 NOTE — Telephone Encounter (Signed)
Per Dr. Roda Shutters, scheduled the patient 10/13 with Dr. Excell Seltzer for PFO consult. Letter sent with appointment information per mother's request.

## 2020-06-14 ENCOUNTER — Inpatient Hospital Stay (HOSPITAL_COMMUNITY): Payer: BC Managed Care – PPO

## 2020-06-14 ENCOUNTER — Inpatient Hospital Stay (HOSPITAL_COMMUNITY): Payer: BC Managed Care – PPO | Admitting: Speech Pathology

## 2020-06-14 NOTE — Progress Notes (Signed)
Speech Language Pathology Daily Session Note  Patient Details  Name: Anzleigh Slaven MRN: [id number] Date of Birth: December 30, 1995  Today's Date: 06/14/2020 SLP Individual Time: 0805-0900 SLP Individual Time Calculation (min): 55 min  Short Term Goals: Week 1: SLP Short Term Goal 1 (Week 1): Patient will consume current diet without overt s/s of aspiration and overall Mod I for use of swallowing compensatory strategies. SLP Short Term Goal 2 (Week 1): Patient will demonstrate sustained attention to functional tasks with Min verbal cus for redirection. SLP Short Term Goal 3 (Week 1): Patient will demonstrate functional problem solving for mildly complex tasks with Min A verbal cues. SLP Short Term Goal 4 (Week 1): Patient will recall new, daily information with Min verbal and visual cues for use of memory compensatory strategies. SLP Short Term Goal 5 (Week 1): Patient will utilize word-finding strategies at the conversation level with supervision level verbal cues.  Skilled Therapeutic Interventions: Skilled treatment session focused on cognitive goals. SLP facilitated session by initiating a memory notebook to maximize recall of events from hospitalization and previous therapy sessions. SLP also facilitated session by providing education regarding memory compensatory strategies WRAP. Patient utilized these strategies during a structured task with overall supervision level verbal cues. Patient left upright in the recliner with alarm on and all needs within reach. Continue with current plan of care.      Pain No/Denies Pain   Therapy/Group: Individual Therapy  Jakarie Pember 06/14/2020, 3:01 PM

## 2020-06-14 NOTE — Progress Notes (Signed)
Occupational Therapy Weekly Progress Note  Patient Details  Name: Gail Montgomery MRN: [id number] Date of Birth: August 17, 1996  Beginning of progress report period: June 08, 2020 End of progress report period: June 14, 2020  Today's Date: 06/14/2020 OT Individual Time: 8341-9622 OT Individual Time Calculation (min): 60 min    Patient has met 4 of 4 short term goals.  Pt is making good progress towards OT goals requiring S for seated ADLs (bathing, UB dressing and grooming) and CGA for stanidng ADLs and transfers. Pt is showing good return in R extremities, hwoever still demo decreased [place] of RUE and stability in RLE impacting safety during transfers and functional mobility at ambulatory level. Pt's mom has completed family education on bathing and toileting at CGA/MIN A level and will continue to be educated on fading A appropriately as pt progresses  Patient continues to demonstrate the following deficits: muscle weakness, abnormal tone, unbalanced muscle activation, decreased coordination and decreased motor planning, decreased awareness, decreased problem solving, decreased safety awareness and decreased memory and decreased standing balance, decreased postural control, hemiplegia and decreased balance strategies and therefore will continue to benefit from skilled OT intervention to enhance overall performance with BADL, iADL and Vocation.  Patient progressing toward long term goals..  Continue plan of care.  OT Short Term Goals Week 1:  OT Short Term Goal 1 (Week 1): Pt will complete toilet transfer with CGA OT Short Term Goal 1 - Progress (Week 1): Revised due to lack of progress OT Short Term Goal 2 (Week 1): Pt will complete shower transfer with min assist OT Short Term Goal 2 - Progress (Week 1): Met OT Short Term Goal 3 (Week 1): Pt will complete LB dressing with min assist OT Short Term Goal 3 - Progress (Week 1): Met OT Short Term Goal 4 (Week 1): Pt will complete LB bathing  with CGA OT Short Term Goal 4 - Progress (Week 1): Met Week 2:  OT Short Term Goal 2 (Week 2): STG=LTG d/t ELOS  Skilled Therapeutic Interventions/Progress Updates:    1:1. Pt received in bed agreeable to OT with pt seated in recliner with belt alarm on. Pt completes ambulation throughout session with CGA and 2 LOB/trips with head turns and visual scanning however pt able to catch self without A. Pt toilets with CGA and completes mobility throughout hospital into courtyard outside, into food court, transfers onto high top chairs/booths and path finds back to rehab unit with min VC. Pt requires 1 seated rest break d/t decreased endurance. Pt completes seated in hand manipulation for [facility] of RUE using scrabble tiles with velcro for palm<>hand manipulation with VC for decreaseing compensatory strategies for increased challenge. Pt spells out phrases/words onto board with forced use of RUE and forced anticipation of where letters will be placed on board as letters are not in order (1 error). Exited session with pt seated in bed, mother in room and call light in reach  Therapy Documentation Precautions:  Precautions Precautions: Fall Precaution Comments: Impulsive; Right sided weakness (LE>UE) Restrictions Weight Bearing Restrictions: No General:   Vital Signs: Therapy Vitals Temp: 98.4 F (36.9 C) Temp Source: Oral Pulse Rate: (!) 107 Resp: 18 BP: 106/72 Patient Position (if appropriate): Lying Oxygen Therapy SpO2: 98 % O2 Device: Room Air Pain:   ADL: ADL Where Assessed-Eating: Wheelchair Grooming: Setup Where Assessed-Grooming: Sitting at sink Upper Body Dressing: Setup Where Assessed-Upper Body Dressing: Sitting at sink Toilet Transfer: Minimal assistance Toilet Transfer Method: Horticulturist, commercial Equipment: Bedside  commode Vision   Perception    Praxis   Exercises:   Other Treatments:     Therapy/Group: Individual Therapy  [name] 06/14/2020,  7:00 AM

## 2020-06-14 NOTE — Progress Notes (Signed)
[place] PHYSICAL MEDICINE & REHABILITATION PROGRESS NOTE   Subjective/Complaints: Just finished SLP. No new complaints today. Pleased with progress. Mom had questions about eliquis  ROS: Patient denies fever, rash, sore throat, blurred vision, nausea, vomiting, diarrhea, cough, shortness of breath or chest pain, joint or back pain, headache, or mood change.    Objective:   No results found. No results for input(s): WBC, HGB, HCT, PLT in the last 72 hours. Recent Labs    06/12/20 1731 06/13/20 0858  NA 136 139  K 4.3 3.5  CL 104 104  CO2 24 27  GLUCOSE 93 104*  BUN 5* <5*  CREATININE 0.60 0.62  CALCIUM 8.9 9.5    Intake/Output Summary (Last 24 hours) at 06/14/2020 1006 Last data filed at 06/13/2020 1900 Gross per 24 hour  Intake 500 ml  Output --  Net 500 ml     Physical Exam: Vital Signs Blood pressure 106/72, pulse (!) 107, temperature 98.4 F (36.9 C), temperature source Oral, resp. rate 18, height 5\' 4"  (1.626 m), weight 77.9 kg, SpO2 98 %. Constitutional: No distress . Vital signs reviewed. HEENT: EOMI, oral membranes moist Neck: supple Cardiovascular: RRR without murmur. No JVD    Respiratory/Chest: CTA Bilaterally without wheezes or rales. Normal effort    GI/Abdomen: BS +, non-tender, non-distended Ext: no clubbing, cyanosis, or edema Psych: pleasant and cooperative, laughing Musc: Lower extremity resolved.  No tenderness. Neuro: Alert and oriented x 3. Functional insight and awareness. Intact Memory.  Delayed speech improving. Some apraxia. Cranial nerve exam unremarkable  Motor: RUE- proximally 4/5 in biceps, triceps and 4/5 in WE/grip and finger abd, decreased FMC.  RLE- HF 3+/5, KE/KF  3+/5, DF 3+/5. Senses pain in the right leg and arm as well as light touch. Improved initiation of movement.   Assessment/Plan: 1. Functional deficits secondary to acute left ischemic stroke, which require 3+ hours per day of interdisciplinary therapy in a  comprehensive inpatient rehab setting.  Physiatrist is providing close team supervision and 24 hour management of active medical problems listed below.  Physiatrist and rehab team continue to assess barriers to discharge/monitor patient progress toward functional and medical goals  Care Tool:  Bathing    Body parts bathed by patient: Right arm, Left arm, Chest, Abdomen, Front perineal area, Buttocks, Right upper leg, Left upper leg, Right lower leg, Left lower leg, Face         Bathing assist Assist Level: Contact Guard/Touching assist     Upper Body Dressing/Undressing Upper body dressing   What is the patient wearing?: Pull over shirt, Bra    Upper body assist Assist Level: Contact Guard/Touching assist    Lower Body Dressing/Undressing Lower body dressing      What is the patient wearing?: Underwear/pull up, Pants     Lower body assist Assist for lower body dressing: Minimal Assistance - Patient > 75%     Toileting Toileting    Toileting assist Assist for toileting: Contact Guard/Touching assist     Transfers Chair/bed transfer  Transfers assist     Chair/bed transfer assist level: Contact Guard/Touching assist     Locomotion Ambulation   Ambulation assist      Assist level: Contact Guard/Touching assist Assistive device: No Device Max distance: 150   Walk 10 feet activity   Assist     Assist level: Contact Guard/Touching assist Assistive device: No Device   Walk 50 feet activity   Assist Walk 50 feet with 2 turns activity did not occur:  Safety/medical concerns  Assist level: Contact Guard/Touching assist Assistive device: No Device    Walk 150 feet activity   Assist Walk 150 feet activity did not occur: Safety/medical concerns  Assist level: Contact Guard/Touching assist Assistive device: No Device    Walk 10 feet on uneven surface  activity   Assist Walk 10 feet on uneven surfaces activity did not occur: Safety/medical  concerns         Wheelchair     Assist Will patient use wheelchair at discharge?: No Type of Wheelchair: Manual    Wheelchair assist level: Supervision/Verbal cueing Max wheelchair distance: 75 ft    Wheelchair 50 feet with 2 turns activity    Assist        Assist Level: Supervision/Verbal cueing   Wheelchair 150 feet activity     Assist          Blood pressure 106/72, pulse (!) 107, temperature 98.4 F (36.9 C), temperature source Oral, resp. rate 18, height 5\' 4"  (1.626 m), weight 77.9 kg, SpO2 98 %.  Medical Problem List and Plan: 1.  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 Associated infarct in left BG  Continue CIR  PRAFO  qhs  2.  B-PE/RLE DVT: Antithrombotics: -anticoagulation:  Pharmaceutical: Other (comment)--Eliquis -I discussed with mom given DVT/PE and the concern about hypercoaguable state that she will be on eliquis for an extended period of time. More tests as an outpt to f/u as well. I advised to hold on COVID booster shot as well given surrounding uncertainty.             -antiplatelet therapy: N/a 3. Pain Management: Tylenol prn. Well controlled.  4. Mood: LCSW to follow for evaluation and support.              -antipsychotic agents: N/A 5. Neuropsych: This patient is not fully capable of making decisions on her own behalf. 6. Skin/Wound Care: Routine pressure relief measures.  7. Fluids/Electrolytes/Nutrition: Monitor I/Os.    -eating well 8. Fevers: Inflammatory markers improving.   Felt to be due to clot burden/central.      resolved  UA unequivocal. Culture with insignificant growth.  9. SIADH/Hyponatremia: Added fluid restrictions. Will continue to monitor with daily checks.   Sodium 133 on 9/13  - gradually relaxing FR--recheck Friday 10. HTN: Monitor BP tid--SBP goal <160.   Controlled on 9/16 11. Resting tachycardia: Multifactorial --continue  to monitor with increase in activity.   Improving, holding in 90's to 100 12. Anemia/thrombocytosis: Will continue to monitor   Hemoglobin 10.6 on 9/10, continue to monitor  Platelets 450 on 9/10, continue to monitor===echeck tomorrow 13. Sick Euthyroid syndrome: Needs follow up thyroid panel in 4-6 weeks.  14. Occ urinary incontinence- appears to have resolved.  15.  Hypokalemia  Potassium 3.7 on 9/12, 3.6 9/13  Continue supplementation---recheck Friday    LOS: 7 days A FACE TO FACE EVALUATION WAS PERFORMED  Thursday 06/14/2020, 10:06 AM

## 2020-06-14 NOTE — Plan of Care (Signed)
  Problem: Consults Goal: RH STROKE PATIENT EDUCATION Description: See Patient Education module for education specifics  Outcome: Progressing   Problem: RH BLADDER ELIMINATION Goal: RH STG MANAGE BLADDER WITH ASSISTANCE Description: STG Manage Bladder With  min Assistance Outcome: Progressing   Problem: RH SKIN INTEGRITY Goal: RH STG SKIN FREE OF INFECTION/BREAKDOWN Description: Manage with cues and reminders Outcome: Progressing   Problem: RH SAFETY Goal: RH STG ADHERE TO SAFETY PRECAUTIONS W/ASSISTANCE/DEVICE Description: STG Adhere to Safety Precautions With  cues/reminders Assistance/Device. Outcome: Progressing   Problem: RH PAIN MANAGEMENT Goal: RH STG PAIN MANAGED AT OR BELOW PT'S PAIN GOAL Description: At or below level 4 Outcome: Progressing

## 2020-06-14 NOTE — Progress Notes (Signed)
Physical Therapy Session Note  Patient Details  Name: Gail Montgomery MRN: [id number] Date of Birth: 11/28/1995  Today's Date: 06/14/2020 PT Individual Time: 4098-1191 PT Individual Time Calculation (min): 60 min   Short Term Goals: Week 1:  PT Short Term Goal 1 (Week 1): Pt will perform STS w/LRAD and cga PT Short Term Goal 2 (Week 1): Pt will ambulate 11ft w/LRAD and mod assist of 1 PT Short Term Goal 3 (Week 1): pt will transfer bed to/from wc w/min assist and LRAD PT Short Term Goal 4 (Week 1): Pt will demonstrate safe wc set up prior to STS and transfers w/min cues Week 2:    Week 3:     Skilled Therapeutic Interventions/Progress Updates:    PAIN denies pain this am   Pt initially supine and resting states she feels "tired today". Supine to sit mod I. STS w/close supervision. Gait 178ft to hallway patient bathroom w/cga, mild decreased clearance on R, improved R knee stabilitiy w/loading. Commode transfer w/cga.  Pt continent of urine and independent w/hygiene and management of pants in standing/cga. Stood at sink w/supervision to wash hands. Gait 76ft to main gym w/cga to min assist, inconsistent stance time R/assemetrical stance phases alternate w/smooth gait.  Functional gait tasks: STS balancing sliding board w/stacked cones using bilat hands.  Gait 143ft including 4 turns maintaining board as above to promote balance and symmetry of gait.  Gait 36ft including 4 turns carrying laundry basket w/8lb load including lifting and lowering basket from floor w/cga, improved gait symmetry noted, pt found activity fatiguing.  Stairs:  Ascends/descends 8 3in steps w/2 rails x cga Ascends/descends 4 5in steps w/2 rails w/cga  Dynamic balance:  W/tic tac toe toss across board placed on 8in curb elevation, pt performed gait/retrogait x 15ft and bending to board to turn dials each turn all w/cga, 3 games completed w/total of 11 passes.  At end of session pt transported back to room.  Pt  left oob in wc w/alarm belt set and needs in reach Mother at side.   HR monitored closely during session.  Ranges from 117-132 w/activity.    Therapy Documentation Precautions:  Precautions Precautions: Fall Precaution Comments: Impulsive; Right sided weakness (LE>UE) Restrictions Weight Bearing Restrictions: No    Therapy/Group: Individual Therapy  Rada Hay, PT   Shearon Balo 06/14/2020, 4:17 PM

## 2020-06-15 ENCOUNTER — Inpatient Hospital Stay (HOSPITAL_COMMUNITY): Payer: BC Managed Care – PPO | Admitting: *Deleted

## 2020-06-15 ENCOUNTER — Inpatient Hospital Stay (HOSPITAL_COMMUNITY): Payer: BC Managed Care – PPO

## 2020-06-15 ENCOUNTER — Inpatient Hospital Stay (HOSPITAL_COMMUNITY): Payer: BC Managed Care – PPO | Admitting: Speech Pathology

## 2020-06-15 ENCOUNTER — Encounter (HOSPITAL_COMMUNITY): Payer: BC Managed Care – PPO

## 2020-06-15 LAB — CBC
HCT: 32.6 % — ABNORMAL LOW (ref 36.0–46.0)
Hemoglobin: 9.8 g/dL — ABNORMAL LOW (ref 12.0–15.0)
MCH: 27.8 pg (ref 26.0–34.0)
MCHC: 30.1 g/dL (ref 30.0–36.0)
MCV: 92.4 fL (ref 80.0–100.0)
Platelets: 328 10*3/uL (ref 150–400)
RBC: 3.53 MIL/uL — ABNORMAL LOW (ref 3.87–5.11)
RDW: 13.5 % (ref 11.5–15.5)
WBC: 2.9 10*3/uL — ABNORMAL LOW (ref 4.0–10.5)
nRBC: 0 % (ref 0.0–0.2)

## 2020-06-15 LAB — BASIC METABOLIC PANEL
Anion gap: 11 (ref 5–15)
BUN: <5 mg/dL — ABNORMAL LOW (ref 6–20)
CO2: 24 mmol/L (ref 22–32)
Calcium: 9.1 mg/dL (ref 8.9–10.3)
Chloride: 103 mmol/L (ref 98–111)
Creatinine, Ser: 0.66 mg/dL (ref 0.44–1.00)
GFR calc Af Amer: >60 mL/min (ref >60)
GFR calc non Af Amer: >60 mL/min (ref >60)
Glucose, Bld: 96 mg/dL (ref 70–99)
Potassium: 3.8 mmol/L (ref 3.5–5.1)
Sodium: 138 mmol/L (ref 135–145)

## 2020-06-15 MED ORDER — ENSURE MAX PROTEIN PO LIQD
11.0000 [oz_av] | Freq: Every day | ORAL | Status: DC
  Administered 2020-06-15 – 2020-06-20 (×4): 11 [oz_av] via ORAL
  Filled 2020-06-15: qty 330

## 2020-06-15 NOTE — Progress Notes (Signed)
Speech Language Pathology Weekly Progress and Session Note  Patient Details  Name: Lataya Varnell MRN: [id number] Date of Birth: 04-12-1996  Beginning of progress report period: June 07, 2020 End of progress report period: June 15, 2020  Today's Date: 06/15/2020 SLP Individual Time: 1350-1435 SLP Individual Time Calculation (min): 45 min  Short Term Goals: Week 1: SLP Short Term Goal 1 (Week 1): Patient will consume current diet without overt s/s of aspiration and overall Mod I for use of swallowing compensatory strategies. SLP Short Term Goal 1 - Progress (Week 1): Met SLP Short Term Goal 2 (Week 1): Patient will demonstrate sustained attention to functional tasks with Min verbal cus for redirection. SLP Short Term Goal 2 - Progress (Week 1): Met SLP Short Term Goal 3 (Week 1): Patient will demonstrate functional problem solving for mildly complex tasks with Min A verbal cues. SLP Short Term Goal 3 - Progress (Week 1): Met SLP Short Term Goal 4 (Week 1): Patient will recall new, daily information with Min verbal and visual cues for use of memory compensatory strategies. SLP Short Term Goal 4 - Progress (Week 1): Revised due to lack of progress SLP Short Term Goal 5 (Week 1): Patient will utilize word-finding strategies at the conversation level with supervision level verbal cues. SLP Short Term Goal 5 - Progress (Week 1): Met    New Short Term Goals: Week 2: SLP Short Term Goal 1 (Week 2): STGs=LTGs due to ELOS  Weekly Progress Updates: Patient has made excellent gains and has met 5 of 5 STGs this reporting period. Currently, patient is consuming regular textures with thin liquids without overt s/s of aspiration and is overall Mod I for use of swallowing compensatory strategies. Patient also demonstrates improved cognitive-linguistic functioning and requires overall Min A verbal cues to complete mildly complex tasks safely in regards to problem solving, recall and attention.  Patient is overall Mod I for use of word-finding strategies. Patient and family education ongoing. Patient would benefit from continued skilled SLP intervention to maximize her cognitive functioning and overall functional independence prior to discharge.     Intensity: Minumum of 1-2 x/day, 30 to 90 minutes Frequency: 3 to 5 out of 7 days Duration/Length of Stay: 06/20/20 Treatment/Interventions: Cognitive remediation/compensation;Internal/external aids;Therapeutic Activities;Environmental controls;Cueing hierarchy;Functional tasks;Patient/family education;Speech/Language facilitation   Daily Session  Skilled Therapeutic Interventions:  Skilled treatment session focused on cognitive goals. SLP facilitated session by providing supervision level verbal cues for recall of her memory compensatory strategies and utilization of strategies during a navigation task around the unit. Patient transferred to and from the recliner with overall supervision. Patient left upright in the recliner with all needs within reach and her mother present. Continue with current plan of care.      Pain No/Denies Pain   Therapy/Group: Individual Therapy  Eusebio Blazejewski 06/15/2020, 6:16 AM

## 2020-06-15 NOTE — Progress Notes (Signed)
Physical Therapy Weekly Progress Note  Patient Details  Name: Gail Montgomery MRN: [id number] Date of Birth: 03-22-1996  Beginning of progress report period: June 08, 2020 End of progress report period: June 15, 2020  Today's Date: 06/15/2020 PT Individual Time: 3710-6269  And 1520-1550 PT Individual Time Calculation (min): 70 min  And 30 min Patient has met 4 of 4 short term goals.  She has made excellent progress in all areas of strength, balance, safety awareness, and functional mobility.    Patient continues to demonstrate the following deficits muscle weakness, decreased cardiorespiratoy endurance, impaired timing and sequencing, unbalanced muscle activation and decreased coordination, decreased safety awareness and decreased standing balance, decreased postural control and decreased balance strategies and therefore will continue to benefit from skilled PT intervention to increase functional independence with mobility.  Patient progressing toward long term goals..  Continue plan of care.  PT Short Term Goals Week 1:  PT Short Term Goal 1 (Week 1): Pt will perform STS w/LRAD and cga PT Short Term Goal 1 - Progress (Week 1): Met PT Short Term Goal 2 (Week 1): Pt will ambulate 70f w/LRAD and mod assist of 1 PT Short Term Goal 2 - Progress (Week 1): Met PT Short Term Goal 3 (Week 1): pt will transfer bed to/from wc w/min assist and LRAD PT Short Term Goal 3 - Progress (Week 1): Met PT Short Term Goal 4 (Week 1): Pt will demonstrate safe wc set up prior to STS and transfers w/min cues Week 2:  PT Short Term Goal 1 (Week 2): STGs=LTGs  Skilled Therapeutic Interventions/Progress Updates:  Ambulation/gait training;DME/adaptive equipment instruction;Neuromuscular re-education;Psychosocial support;Stair training;UE/LE Strength taining/ROM;Wheelchair propulsion/positioning;Balance/vestibular training;Discharge planning;Therapeutic Activities;UE/LE Coordination activities;Cognitive  remediation/compensation;Disease management/prevention;Functional mobility training;Patient/family education;Splinting/orthotics;Therapeutic Exercise    AM session PAIN  Denies pain Pt initially supine and agreeable to session.  Supine to sit w/supervision.  Donns shoes in sitting w/supervision. STS w/supervision  Short distance gait to bathroom including negotiation of threshold and commode transfer w/supervision Supervision w/toilteting. Gait 4034finitially w/cga progressing to w/close supervision and cues for R armswing, mild decreased stance time on R and mild woblles R knee w/loading but improving overall gait pattern. HR 130.   Pt educated re: HR monitoring, RPE, and safe guidelines for cardiovascular fitness following DC using upper 130s as max HR, use of interval training within range and endurance training at low 130s for conditioning w/gradual increase in total time.  At end of session, also educated mother.  Patient has an Applewatch at home and discussed using it for HR monitoring at home.  Floor transfer:  Therapist demonstrated safe techniqe using mat (stable surface at home) for assist.  Mat to floor and floor to mat performed w/cga and verbal cues for sequencing/use of LLE for power up vs RLE.     Functional gait training: Obstacle course including stepping up and over, straight over obstacles, weaving, backing, sidestepping and repeating course carrying 5lb weighted basket all w/close supervision.  Pt performed commode transfer w/distant supervision in gym bathroom including all toileting and washing hands at sink.   Step ups w/RLE fto improve knee control performed x 8 reps  Gait 40076f/supervision, deviations as above, cues for armswing.  Deviations decreasing and pattern becoming increasingly normalized.  At end of session, pt left oob in recliner w/mother at side.  Pm session PAIN denies pain  Mother requesting clearance to take pt to [employer] and outside over  weekend.  Explained that she would need MD clearance to take pt off  of floor.  Agreed to perform formal balance eval and discuss.  Gait 190f to gym w/supervision, gait pattern continues to improve.   Pt engaged in BERG balance assessment.  See flowsheet for details.  Lower scores only on tasks involving reduced base of support and single limb challenges.  Patient demonstrates increased fall risk as noted by score of   48/56 on Berg Balance Scale.  (<36= high risk for falls, close to 100%; 37-45 significant >80%; 46-51 moderate >50%; 52-55 lower >25%)  Gait 1553fto room w/supervision. In room, discussed fingings of balance assessment, risk of falls, and need for HHA or linking arms if pt out of room w/mothers assist.  Mother voiced understanding and agreed to provide arm in arm support if walking in halls.  Also agreed to discuss leaving floor further w/medical staff.   Therapy Documentation Precautions:  Precautions Precautions: Fall Precaution Comments: Impulsive; Right sided weakness (LE>UE) Restrictions Weight Bearing Restrictions: No   Therapy/Group: Individual Therapy  BaCallie FieldingPTMurchison/17/2021, 7:51 AM

## 2020-06-15 NOTE — Plan of Care (Signed)
  Problem: RH BLADDER ELIMINATION Goal: RH STG MANAGE BLADDER WITH ASSISTANCE Description: STG Manage Bladder With  min Assistance Outcome: Progressing   Problem: Consults Goal: RH STROKE PATIENT EDUCATION Description: See Patient Education module for education specifics  Outcome: Progressing   Problem: RH SKIN INTEGRITY Goal: RH STG SKIN FREE OF INFECTION/BREAKDOWN Description: Manage with cues and reminders Outcome: Progressing   Problem: RH SAFETY Goal: RH STG ADHERE TO SAFETY PRECAUTIONS W/ASSISTANCE/DEVICE Description: STG Adhere to Safety Precautions With  cues/reminders Assistance/Device. Outcome: Progressing   Problem: RH PAIN MANAGEMENT Goal: RH STG PAIN MANAGED AT OR BELOW PT'S PAIN GOAL Description: At or below level 4 Outcome: Progressing

## 2020-06-15 NOTE — Progress Notes (Signed)
Occupational Therapy Session Note  Patient Details  Name: Gail Montgomery MRN: [id number] Date of Birth: 02-26-1996  60 min group: 11-1200p  Short Term Goals: Week 1:  OT Short Term Goal 1 (Week 1): Pt will complete toilet transfer with CGA OT Short Term Goal 1 - Progress (Week 1): Revised due to lack of progress OT Short Term Goal 2 (Week 1): Pt will complete shower transfer with min assist OT Short Term Goal 2 - Progress (Week 1): Met OT Short Term Goal 3 (Week 1): Pt will complete LB dressing with min assist OT Short Term Goal 3 - Progress (Week 1): Met OT Short Term Goal 4 (Week 1): Pt will complete LB bathing with CGA OT Short Term Goal 4 - Progress (Week 1): Met Week 2:  OT Short Term Goal 2 (Week 2): STG=LTG d/t ELOS  Skilled Therapeutic Interventions/Progress Updates:    Pt received in w/c agreeable to group. Pain stated at beginning of session 2/10 in neck. Repositioning, cervical stretches, rest, and alternative strategies approached in session. Pt participated in an adaptive yoga group to addresses balance, strengthening, improving ROM/flexibility, alternative pain strategies, stress reduction and improve social participation/adjustment to situation by interacting with peers. Patient was led through warm up exercises for all 4 limbs with focus on RLE and UE for NMR at far ranges of motion, formal adapted seated yoga poses, meditation and breath-work to address the above deficits. Pt tolerated well and returned to room with call light in reach and all needs met.   Therapy Documentation Precautions:  Precautions Precautions: Fall Precaution Comments: Impulsive; Right sided weakness (LE>UE) Restrictions Weight Bearing Restrictions: No General:   Vital Signs:   Pain:   ADL: ADL Where Assessed-Eating: Wheelchair Grooming: Setup Where Assessed-Grooming: Sitting at sink Upper Body Dressing: Setup Where Assessed-Upper Body Dressing: Sitting at sink Toilet Transfer: Minimal  assistance Toilet Transfer Method: Stand pivot Toilet Transfer Equipment: Bedside commode Vision   Perception    Praxis   Exercises:   Other Treatments:     Therapy/Group: Group Therapy  [name] 06/15/2020, 6:46 AM

## 2020-06-15 NOTE — Progress Notes (Signed)
Gail Montgomery PHYSICAL MEDICINE & REHABILITATION PROGRESS NOTE   Subjective/Complaints: Up in gym with PT working on stepping up to block. HR in 120's. Felt a little weak after a few minutes of doing exercise  ROS: Patient denies fever, rash, sore throat, blurred vision, nausea, vomiting, diarrhea, cough, shortness of breath or chest pain, joint or back pain, headache, or mood change.   Objective:   No results found. Recent Labs    06/15/20 0620  WBC 2.9*  HGB 9.8*  HCT 32.6*  PLT 328   Recent Labs    06/13/20 0858 06/15/20 0620  NA 139 138  K 3.5 3.8  CL 104 103  CO2 27 24  GLUCOSE 104* 96  BUN <5* <5*  CREATININE 0.62 0.66  CALCIUM 9.5 9.1    Intake/Output Summary (Last 24 hours) at 06/15/2020 0916 Last data filed at 06/14/2020 1802 Gross per 24 hour  Intake 540 ml  Output --  Net 540 ml     Physical Exam: Vital Signs Blood pressure 128/74, pulse 96, temperature 98.4 F (36.9 C), temperature source Oral, resp. rate 20, height 5\' 4"  (1.626 m), weight 77.9 kg, SpO2 99 %. Constitutional: No distress . Vital signs reviewed. HEENT: EOMI, oral membranes moist Neck: supple Cardiovascular: tachy. No JVD    Respiratory/Chest: CTA Bilaterally without wheezes or rales. Normal effort    GI/Abdomen: BS +, non-tender, non-distended Ext: no clubbing, cyanosis, or edema Psych: pleasant and cooperative Musc: Lower extremity resolved.  No tenderness. Neuro:Alert and oriented x 3. Normal insight and awareness. Intact Memory. Normal language and speech. Cranial nerve exam unremarkable. Still some apraxia but better  Motor: RUE- proximally 4/5 in biceps, triceps and 4/5 in WE/grip and finger abd, decreased FMC.  RLE- HF 3+/5, KE/KF  3+ to 4-/5, DF 3+/5. Senses pain and LT  Assessment/Plan: 1. Functional deficits secondary to acute left ischemic stroke, which require 3+ hours per day of interdisciplinary therapy in a comprehensive inpatient rehab setting.  Physiatrist is  providing close team supervision and 24 hour management of active medical problems listed below.  Physiatrist and rehab team continue to assess barriers to discharge/monitor patient progress toward functional and medical goals  Care Tool:  Bathing    Body parts bathed by patient: Right arm, Left arm, Chest, Abdomen, Front perineal area, Buttocks, Right upper leg, Left upper leg, Right lower leg, Left lower leg, Face         Bathing assist Assist Level: Contact Guard/Touching assist     Upper Body Dressing/Undressing Upper body dressing   What is the patient wearing?: Pull over shirt, Bra    Upper body assist Assist Level: Contact Guard/Touching assist    Lower Body Dressing/Undressing Lower body dressing      What is the patient wearing?: Underwear/pull up, Pants     Lower body assist Assist for lower body dressing: Minimal Assistance - Patient > 75%     Toileting Toileting    Toileting assist Assist for toileting: Contact Guard/Touching assist     Transfers Chair/bed transfer  Transfers assist     Chair/bed transfer assist level: Contact Guard/Touching assist     Locomotion Ambulation   Ambulation assist      Assist level: Contact Guard/Touching assist Assistive device: No Device Max distance: 150   Walk 10 feet activity   Assist     Assist level: Contact Guard/Touching assist Assistive device: No Device   Walk 50 feet activity   Assist Walk 50 feet with 2 turns activity  did not occur: Safety/medical concerns  Assist level: Contact Guard/Touching assist Assistive device: No Device    Walk 150 feet activity   Assist Walk 150 feet activity did not occur: Safety/medical concerns  Assist level: Contact Guard/Touching assist Assistive device: No Device    Walk 10 feet on uneven surface  activity   Assist Walk 10 feet on uneven surfaces activity did not occur: Safety/medical concerns         Wheelchair     Assist Will  patient use wheelchair at discharge?: No Type of Wheelchair: Manual    Wheelchair assist level: Supervision/Verbal cueing Max wheelchair distance: 75 ft    Wheelchair 50 feet with 2 turns activity    Assist        Assist Level: Supervision/Verbal cueing   Wheelchair 150 feet activity     Assist          Blood pressure 128/74, pulse 96, temperature 98.4 F (36.9 C), temperature source Oral, resp. rate 20, height 5\' 4"  (1.626 m), weight 77.9 kg, SpO2 99 %.  Medical Problem List and Plan: 1.  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 Associated infarct in left BG  Continue CIR  PRAFO  qhs  -I have reviewed covid booster shot with mother/pt. No indication for it at this time given numerous factors we discussed  2.  B-PE/RLE DVT: Antithrombotics: -anticoagulation:  Pharmaceutical: Other (comment)--Eliquis             -antiplatelet therapy: N/a 3. Pain Management: Tylenol prn. Well controlled.  4. Mood: LCSW to follow for evaluation and support.              -antipsychotic agents: N/A 5. Neuropsych: This patient is not fully capable of making decisions on her own behalf. 6. Skin/Wound Care: Routine pressure relief measures.  7. Fluids/Electrolytes/Nutrition: Monitor I/Os.    -eating well  -protein supps for low albumin 8. Fevers: Inflammatory markers improving.   Felt to be due to clot burden/central.      resolved  UA unequivocal. Culture with insignificant growth.  9. SIADH/Hyponatremia: Added fluid restrictions. Will continue to monitor with daily checks.   Sodium 138 9/17  -dc FR 10. HTN: Monitor BP tid--SBP goal <160.   Controlled on 9/17 11. Resting tachycardia: Multifactorial --continue to monitor with increase in activity.   Improving, holding in 90's to 100 12. Anemia/thrombocytosis: Will continue to monitor   Hemoglobin 10.6 on 9/10. Holding at 9.8 today 9/17  Platelets  450 on 9/10, 328 9/17 13. Sick Euthyroid syndrome: Needs follow up thyroid panel in 4-6 weeks.  14. Occ urinary incontinence- appears to have resolved.  15.  Hypokalemia  Potassium 3.8 9/15  Continue supplementation daily supp    LOS: 8 days A FACE TO FACE EVALUATION WAS PERFORMED  10/15 06/15/2020, 9:16 AM

## 2020-06-15 NOTE — Evaluation (Signed)
Recreational Therapy Assessment and Plan  Patient Details  Name: Gail Montgomery MRN: [id number] Date of Birth: March 15, 1996 Today's Date: 06/15/2020  Rehab Potential:  Good ELOS:   9/22  Assessment  Hospital Problem: Principal Problem:   Acute ischemic left MCA stroke [facility])   Past Medical History:      Past Medical History:  Diagnosis Date  . Anxiety   . Panic attack    Past Surgical History:       Past Surgical History:  Procedure Laterality Date  . BUBBLE STUDY  06/06/2020   Procedure: BUBBLE STUDY;  Surgeon: Pixie Casino, MD;  Location: [place];  Service: Cardiovascular;;  . IR CT HEAD LTD  05/19/2020  . IR CT HEAD LTD  05/19/2020  . IR PERCUTANEOUS ART THROMBECTOMY/INFUSION INTRACRANIAL INC DIAG ANGIO  05/19/2020  . IR US GUIDE VASC ACCESS RIGHT  05/19/2020  . RADIOLOGY WITH ANESTHESIA N/A 05/19/2020   Procedure: IR WITH ANESTHESIA;  Surgeon: Radiologist, Medication, MD;  Location: [place];  Service: Radiology;  Laterality: N/A;  . TEE WITHOUT CARDIOVERSION N/A 06/06/2020   Procedure: TRANSESOPHAGEAL ECHOCARDIOGRAM (TEE);  Surgeon: Pixie Casino, MD;  Location[facility] ENDOSCOPY;  Service: Cardiovascular;  Laterality: N/A;    Assessment & Plan Clinical Impression: 24 year old female who was admitted to the hospital on 05/19/20 with sudden onset of right hemiparesis and aphasia. She presented as a code stroke with acute left MCA Infarct. She received TPA at 11:29 am, thrombectomy With reperfusion, but Left ACA had residual small distal anterior cerebral clot. During the procedure there was an ICA vasospasm versus dissection initially thought she needs a stent however upon reimaging the vessel was open. She was loaded with aspirin and Plavix. She was also found to be Covid positive but was asymptomatic. Her hospital course was complicated by [facility], parenchymal hemorrhage and interval ventricular hemorrhage. She completed the course of treatment for E. coli UTI. She  was also found to have a DVT/PE and was started on IV heparin and eventually transitioned to Eliquis on 06/01/2020. She underwent TEE showing PFO. She will follow-up with cardiology as an outpatient for PFO closure.PPatient transferred to CIR on 06/07/2020 .   Pt presents with decreased activity tolerance, decreased functional mobility, decreased balance, decreased coordination Limiting pt's independence with leisure/community pursuits.  Met wit pt today to discuss TR services, leisure interests/life satisfiers, coping strategies and discharge planning.     Plan  Min 1 TR session >20 minutes during LOS  Recommendations for other services: Neuropsych  Discharge Criteria: Patient will be discharged from TR if patient refuses treatment 3 consecutive times without medical reason.  If treatment goals not met, if there is a change in medical status, if patient makes no progress towards goals or if patient is discharged from hospital.  The above assessment, treatment plan, treatment alternatives and goals were discussed and mutually agreed upon: by patient  [place] 06/15/2020, 9:16 AM

## 2020-06-16 ENCOUNTER — Inpatient Hospital Stay (HOSPITAL_COMMUNITY): Payer: BC Managed Care – PPO | Admitting: Physical Therapy

## 2020-06-16 ENCOUNTER — Inpatient Hospital Stay (HOSPITAL_COMMUNITY): Payer: BC Managed Care – PPO

## 2020-06-16 NOTE — Plan of Care (Signed)
°  Problem: Consults Goal: RH STROKE PATIENT EDUCATION Description: See Patient Education module for education specifics  Outcome: Progressing   Problem: RH BLADDER ELIMINATION Goal: RH STG MANAGE BLADDER WITH ASSISTANCE Description: STG Manage Bladder With  min Assistance Outcome: Progressing   Problem: RH SKIN INTEGRITY Goal: RH STG SKIN FREE OF INFECTION/BREAKDOWN Description: Manage with cues and reminders Outcome: Progressing   Problem: RH SAFETY Goal: RH STG ADHERE TO SAFETY PRECAUTIONS W/ASSISTANCE/DEVICE Description: STG Adhere to Safety Precautions With  cues/reminders Assistance/Device. Outcome: Progressing   Problem: RH PAIN MANAGEMENT Goal: RH STG PAIN MANAGED AT OR BELOW PT'S PAIN GOAL Description: At or below level 4 Outcome: Progressing   

## 2020-06-16 NOTE — Progress Notes (Signed)
Pt alert, rr-12, pulse- 110 ( manual) yellow mews alerted. This nurse contacted on call MD and charge nurse. Dr. Erline Hau advised to continue to monitor pt. Yellow Mews protocol in place. Will continue to monitor.

## 2020-06-16 NOTE — Progress Notes (Signed)
[facility] PHYSICAL MEDICINE & REHABILITATION PROGRESS NOTE   Subjective/Complaints:  No issues overnite , denies problems with bowel or bladder, is continent   ROS: Patient denies CP, SOB, N/V/D  Objective:   No results found. Recent Labs    06/15/20 0620  WBC 2.9*  HGB 9.8*  HCT 32.6*  PLT 328   Recent Labs    06/13/20 0858 06/15/20 0620  NA 139 138  K 3.5 3.8  CL 104 103  CO2 27 24  GLUCOSE 104* 96  BUN <5* <5*  CREATININE 0.62 0.66  CALCIUM 9.5 9.1    Intake/Output Summary (Last 24 hours) at 06/16/2020 0854 Last data filed at 06/16/2020 0710 Gross per 24 hour  Intake 500 ml  Output --  Net 500 ml     Physical Exam: Vital Signs Blood pressure 119/80, pulse 99, temperature 98.7 F (37.1 C), resp. rate 18, height 5\' 4"  (1.626 m), weight 77.9 kg, SpO2 98 %. Constitutional: No distress . Vital signs reviewed. HEENT: EOMI, oral membranes moist Neck: supple Cardiovascular: tachy. No JVD    Respiratory/Chest: CTA Bilaterally without wheezes or rales. Normal effort    GI/Abdomen: BS +, non-tender, non-distended Ext: no clubbing, cyanosis, or edema Psych: pleasant and cooperative Musc: Lower extremity resolved.  No tenderness. Neuro:Alert and oriented x 3. Normal insight and awareness. Intact Memory. Normal language and speech. Cranial nerve exam unremarkable. Still some apraxia but better  Motor: RUE- proximally 4/5 in biceps, triceps and 4/5 in WE/grip and finger abd, decreased FMC.  RLE- HF 3+/5, KE/KF  3+ to 4-/5, DF 3+/5. Senses pain and LT  Assessment/Plan: 1. Functional deficits secondary to acute left ischemic stroke, which require 3+ hours per day of interdisciplinary therapy in a comprehensive inpatient rehab setting.  Physiatrist is providing close team supervision and 24 hour management of active medical problems listed below.  Physiatrist and rehab team continue to assess barriers to discharge/monitor patient progress toward functional and medical  goals  Care Tool:  Bathing    Body parts bathed by patient: Right arm, Left arm, Chest, Abdomen, Front perineal area, Buttocks, Right upper leg, Left upper leg, Right lower leg, Left lower leg, Face         Bathing assist Assist Level: Contact Guard/Touching assist     Upper Body Dressing/Undressing Upper body dressing   What is the patient wearing?: Pull over shirt, Bra    Upper body assist Assist Level: Contact Guard/Touching assist    Lower Body Dressing/Undressing Lower body dressing      What is the patient wearing?: Underwear/pull up, Pants     Lower body assist Assist for lower body dressing: Minimal Assistance - Patient > 75%     Toileting Toileting    Toileting assist Assist for toileting: Contact Guard/Touching assist     Transfers Chair/bed transfer  Transfers assist     Chair/bed transfer assist level: Contact Guard/Touching assist     Locomotion Ambulation   Ambulation assist      Assist level: Supervision/Verbal cueing Assistive device: No Device Max distance: 400   Walk 10 feet activity   Assist     Assist level: Supervision/Verbal cueing Assistive device: No Device   Walk 50 feet activity   Assist Walk 50 feet with 2 turns activity did not occur: Safety/medical concerns  Assist level: Supervision/Verbal cueing Assistive device: No Device    Walk 150 feet activity   Assist Walk 150 feet activity did not occur: Safety/medical concerns  Assist level: Supervision/Verbal cueing  Assistive device: No Device    Walk 10 feet on uneven surface  activity   Assist Walk 10 feet on uneven surfaces activity did not occur: Safety/medical concerns   Assist level: Supervision/Verbal cueing Assistive device:  (no device)   Wheelchair     Assist Will patient use wheelchair at discharge?: No Type of Wheelchair: Manual    Wheelchair assist level: Supervision/Verbal cueing Max wheelchair distance: 75 ft    Wheelchair  50 feet with 2 turns activity    Assist        Assist Level: Supervision/Verbal cueing   Wheelchair 150 feet activity     Assist          Blood pressure 119/80, pulse 99, temperature 98.7 F (37.1 C), resp. rate 18, height 5\' 4"  (1.626 m), weight 77.9 kg, SpO2 98 %.  Medical Problem List and Plan: 1.  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 Associated infarct in left BG  Continue CIR  PRAFO  qhs  -I have reviewed covid booster shot with mother/pt. No indication for it at this time given numerous factors we discussed  2.  B-PE/RLE DVT: Antithrombotics: -anticoagulation:  Pharmaceutical: Other (comment)--Eliquis             -antiplatelet therapy: N/a 3. Pain Management: Tylenol prn. Well controlled.  4. Mood: LCSW to follow for evaluation and support.              -antipsychotic agents: N/A 5. Neuropsych: This patient is not fully capable of making decisions on her own behalf. 6. Skin/Wound Care: Routine pressure relief measures.  7. Fluids/Electrolytes/Nutrition: Monitor I/Os.    -eating well  -protein supps for low albumin 8. Fevers: Inflammatory markers improving.   Felt to be due to clot burden/central.      resolved  UA unequivocal. Culture with insignificant growth.  9. SIADH/Hyponatremia: Resolved . Will continue to monitor with daily checks.   Sodium 138 9/17  -dc FR 10. HTN: Monitor BP tid--SBP goal <160.  Vitals:   06/15/20 2008 06/16/20 0546  BP: 102/64 119/80  Pulse: 75 99  Resp: 18   Temp: 98.2 F (36.8 C) 98.7 F (37.1 C)  SpO2: 99% 98%    11. Resting tachycardia: Multifactorial --continue to monitor with increase in activity.   Improving, holding in 90's to 100 12. Anemia/thrombocytosis: Will continue to monitor   Hemoglobin 10.6 on 9/10. Holding at 9.8 today 9/17  Platelets 450 on 9/10, 328 9/17 Check stool guaic 13. Sick Euthyroid syndrome: Needs follow  up thyroid panel in 4-6 weeks.  14. Occ urinary incontinence- appears to have resolved.  15.  Hypokalemia  Potassium 3.8 9/15  Continue supplementation daily supp    LOS: 9 days A FACE TO FACE EVALUATION WAS PERFORMED  10/15 06/16/2020, 8:54 AM

## 2020-06-16 NOTE — Progress Notes (Signed)
Physical Therapy Session Note  Patient Details  Name: Gail Montgomery MRN: [id number] Date of Birth: [birth year]  Today's Date: 06/16/2020 PT Individual Time: 1445-1610 PT Individual Time Calculation (min): 85 min   Short Term Goals: Week 2:  PT Short Term Goal 1 (Week 2): STGs=LTGs    Skilled Therapeutic Interventions/Progress Updates:   Pt received sitting in EOB and agreeable to PT. PT instructed pt gait training through rehab unit with distant supervision assist for safety and min cues for consistent heel strike.  Dynamic balance/dual task training to perform Wii resort fine and gross motor task while standing on blue wedge. Pt progress from forefoot only on wedge to entire foot. CGA fading to min assist with increased challenge to prevent posterior LOB. Cues for attention to balance throughout while utilizing BUE and improved use of ankle strategy to prevent posterior bias and improve COM control. Cues for improved trunkal rotation and shoulder ROM through canoe and sword fight activities. Educated pt's mother and pt on rehab process, follow up therapy needs, energy conservation as well as use of community resources to aid in transition to indepenPatient returned to room and left sitting on EOB with call bell in reach and all needs met.         Therapy Documentation Precautions:  Precautions Precautions: Fall Precaution Comments: Impulsive; Right sided weakness (LE>UE) Restrictions Weight Bearing Restrictions: No    Vital Signs: Therapy Vitals Temp: 98.2 F (36.8 C) Pulse Rate: (!) 107 Resp: 18 BP: 108/66 Patient Position (if appropriate): Lying Oxygen Therapy SpO2: 99 % O2 Device: Room Air Pain: Pain Assessment Pain Scale: Faces Pain Score: 0-No pain Faces Pain Scale: No hurt    Therapy/Group: Individual Therapy  Lorie Phenix 06/16/2020, 4:43 PM

## 2020-06-16 NOTE — Progress Notes (Addendum)
Occupational Therapy Session Note  Patient Details  Name: Gail Montgomery MRN: [id number] Date of Birth: [birth year]  Today's Date: 06/16/2020 OT Individual Time: 0086-7619 OT Individual Time Calculation (min): 58 min    Short Term Goals: Week 1:  OT Short Term Goal 1 (Week 1): Pt will complete toilet transfer with CGA OT Short Term Goal 1 - Progress (Week 1): Revised due to lack of progress OT Short Term Goal 2 (Week 1): Pt will complete shower transfer with min assist OT Short Term Goal 2 - Progress (Week 1): Met OT Short Term Goal 3 (Week 1): Pt will complete LB dressing with min assist OT Short Term Goal 3 - Progress (Week 1): Met OT Short Term Goal 4 (Week 1): Pt will complete LB bathing with CGA OT Short Term Goal 4 - Progress (Week 1): Met  Skilled Therapeutic Interventions/Progress Updates:    1:1. Pt received in reclienr with mom present having already completed ADLS this morning. Pt agreeable to scavenger hunt throughout hospital to find 3/3 locations indicated by OT. Pt requires overal min cuing to utilize external aides and understanding directions to fomd 3/3 locations at ambultory level with 2 seated rest breaks. Pt able to identify learned strategy of propping/leaning on all for rest break if no w/c or seat was present. Pt completes seated 9HPT RUE: 37 sec, LUE 33 seconds with long fingernails impacting performance. Pt completes organized png pong ball arrangement based on colors/picture with min VC for strategizing/correcting 1 error. Exited session with pt seated in recliner with mom in room  Addendum: Pt mother requesting knee brace be taken back as pt has not used it and taken off the bill. Alerted CSW and RN to call ortho to retrieve brace.  Therapy Documentation Precautions:  Precautions Precautions: Fall Precaution Comments: Impulsive; Right sided weakness (LE>UE) Restrictions Weight Bearing Restrictions: No General:   Vital Signs: Therapy Vitals Temp: 98.7 F  (37.1 C) Pulse Rate: 99 BP: 119/80 Patient Position (if appropriate): Lying Oxygen Therapy SpO2: 98 % O2 Device: Room Air Pain:   ADL: ADL Where Assessed-Eating: Wheelchair Grooming: Setup Where Assessed-Grooming: Sitting at sink Upper Body Dressing: Setup Where Assessed-Upper Body Dressing: Sitting at sink Toilet Transfer: Minimal assistance Toilet Transfer Method: Stand Ecologist: Bedside commode Vision   Perception    Praxis   Exercises:   Other Treatments:     Therapy/Group: Individual Therapy  [name] 06/16/2020, 9:16 AM

## 2020-06-16 NOTE — Progress Notes (Signed)
Speech Language Pathology Daily Session Note  Patient Details  Name: Gail Montgomery MRN: [id number] Date of Birth: 08/30/1996  Today's Date: 06/16/2020 SLP Individual Time: 7829-5621 SLP Individual Time Calculation (min): 30 min  Short Term Goals: Week 2: SLP Short Term Goal 1 (Week 2): STGs=LTGs due to ELOS  Skilled Therapeutic Interventions:Skilled SLP intervention focused on cognition and word finding. Patient and pt mother educated on functional tasks the pt can complete once home. Patient educated on allowing herself breaks when completing cognitive tasks at home. Short term memory recall task completed with min a verbal cues to recall relevant details in everyday ads. Pt used repetition as a memory strategy to recall details. Pt demonstrated good recall of dc date of 9/22.  Word description task using items less commonly used and requiring specific details completed with min A for including specific information. Pt left seated upright in bed with mother present in room.Bed alarm set. Cont with therapy per plan of care.      Pain Pain Assessment Pain Scale: Faces Pain Score: 0-No pain Faces Pain Scale: No hurt  Therapy/Group: Individual Therapy  Carlean Jews Maysun Meditz 06/16/2020, 3:52 PM

## 2020-06-16 NOTE — Progress Notes (Signed)
   06/16/20 2040  Vitals  Temp 98.2 F (36.8 C)  BP (!) 128/91 (lpn notified   Simultaneous filing. User may not have seen previous data.)  BP Location Right Arm  BP Method Automatic  Patient Position (if appropriate) Sitting  Pulse Rate (!) 110 (lpn notified)  Pulse Rate Source Monitor  Resp 12  Level of Consciousness  Level of Consciousness Alert  MEWS COLOR  MEWS Score Color Yellow  Oxygen Therapy  SpO2 100 %  O2 Device Room Air  Pain Assessment  Pain Scale 0-10  Pain Score 0  PCA/Epidural/Spinal Assessment  Respiratory Pattern Regular;Unlabored  MEWS Score  MEWS Temp 0  MEWS Systolic 0  MEWS Pulse 1  MEWS RR 1  MEWS LOC 0  MEWS Score 2  Provider Notification  Provider Name/Title MD Kirstiens   Date Provider Notified 06/16/20  Time Provider Notified 2200  Notification Type Call  Notification Reason Other (Comment) (yellow mews)  Response No new orders  Date of Provider Response 06/16/20  Time of Provider Response 2045

## 2020-06-17 ENCOUNTER — Encounter (HOSPITAL_COMMUNITY): Payer: BC Managed Care – PPO | Admitting: Occupational Therapy

## 2020-06-17 LAB — OCCULT BLOOD X 1 CARD TO LAB, STOOL: Fecal Occult Bld: NEGATIVE

## 2020-06-17 NOTE — Progress Notes (Signed)
Occupational Therapy Session Note  Patient Details  Name: Gail Montgomery MRN: [id number] Date of Birth: 05-16-1996  Today's Date: 06/17/2020 OT Group Time: 1100-1200 OT Group Time Calculation (min): 60 min  Skilled Therapeutic Interventions/Progress Updates:    Pt engaged in therapeutic w/c level dance group focusing on patient choice, UE/LE strengthening, salience, activity tolerance, and social participation. Pt was guided through various dance-based exercises involving UEs/LEs and trunk. All music was selected by group members. Emphasis placed on standing balance and Rt NMR. Pt and her mother were very involved in group (mother is a Horticulturist, commercial). They coordinated UE/LE dance exercises while seated, and also stood together with distance supervision to dance at times. No LOBs. At end of session her mother escorted pt back to room via w/c.   No s/s elevated HR, per pt she felt good participating during session   Therapy Documentation Precautions:  Precautions Precautions: Fall Precaution Comments: Impulsive; Right sided weakness (LE>UE) Restrictions Weight Bearing Restrictions: No Vital Signs: Therapy Vitals Temp: 98.3 F (36.8 C) Pulse Rate: (!) 127 Resp: 18 BP: 111/86 Patient Position (if appropriate): Standing Oxygen Therapy SpO2: 99 % O2 Device: Room Air Pain: no s/s pain during session   ADL: ADL Where Assessed-Eating: Wheelchair Grooming: Setup Where Assessed-Grooming: Sitting at sink Upper Body Dressing: Setup Where Assessed-Upper Body Dressing: Sitting at sink Toilet Transfer: Minimal assistance Toilet Transfer Method: Stand pivot Toilet Transfer Equipment: Bedside commode     Therapy/Group: Group Therapy  Jamariya Davidoff A Aemon Koeller 06/17/2020, 12:50 PM

## 2020-06-17 NOTE — Progress Notes (Addendum)
[facility] PHYSICAL MEDICINE & REHABILITATION PROGRESS NOTE   Subjective/Complaints:  Orthostatic vitals demonstated no drop in BP but HR elevated with standing , no dizziness when up feels well. Discussed tachycard with pt and mom   ROS: Patient denies CP, SOB, N/V/D  Objective:   No results found. Recent Labs    06/15/20 0620  WBC 2.9*  HGB 9.8*  HCT 32.6*  PLT 328   Recent Labs    06/15/20 0620  NA 138  K 3.8  CL 103  CO2 24  GLUCOSE 96  BUN <5*  CREATININE 0.66  CALCIUM 9.1    Intake/Output Summary (Last 24 hours) at 06/17/2020 0850 Last data filed at 06/17/2020 0735 Gross per 24 hour  Intake 340 ml  Output --  Net 340 ml     Physical Exam: Vital Signs Blood pressure 116/73, pulse 98, temperature 98.5 F (36.9 C), resp. rate 18, height 5\' 4"  (1.626 m), weight 77.9 kg, SpO2 100 %.  General: No acute distress Mood and affect are appropriate Heart: Regular rate and rhythm no rubs murmurs or extra sounds Lungs: Clear to auscultation, breathing unlabored, no rales or wheezes Abdomen: Positive bowel sounds, soft nontender to palpation, nondistended Extremities: No clubbing, cyanosis, or edema Skin: No evidence of breakdown, no evidence of rash Musc: Lower extremity resolved.  No tenderness. Neuro:Alert and oriented x 3. Normal insight and awareness. Intact Memory. Normal language and speech. Cranial nerve exam unremarkable. Still some apraxia but better  Motor: RUE- proximally 4/5 in biceps, triceps and 4/5 in WE/grip and finger abd, decreased FMC.  RLE- HF 3+/5, KE/KF  3+ to 4-/5, DF 3+/5. Senses pain and LT  Assessment/Plan: 1. Functional deficits secondary to acute left ischemic stroke, which require 3+ hours per day of interdisciplinary therapy in a comprehensive inpatient rehab setting.  Physiatrist is providing close team supervision and 24 hour management of active medical problems listed below.  Physiatrist and rehab team continue to assess barriers  to discharge/monitor patient progress toward functional and medical goals  Care Tool:  Bathing    Body parts bathed by patient: Right arm, Left arm, Chest, Abdomen, Front perineal area, Buttocks, Right upper leg, Left upper leg, Right lower leg, Left lower leg, Face         Bathing assist Assist Level: Contact Guard/Touching assist     Upper Body Dressing/Undressing Upper body dressing   What is the patient wearing?: Pull over shirt, Bra    Upper body assist Assist Level: Contact Guard/Touching assist    Lower Body Dressing/Undressing Lower body dressing      What is the patient wearing?: Underwear/pull up, Pants     Lower body assist Assist for lower body dressing: Minimal Assistance - Patient > 75%     Toileting Toileting    Toileting assist Assist for toileting: Contact Guard/Touching assist     Transfers Chair/bed transfer  Transfers assist     Chair/bed transfer assist level: Contact Guard/Touching assist     Locomotion Ambulation   Ambulation assist      Assist level: Supervision/Verbal cueing Assistive device: No Device Max distance: 400   Walk 10 feet activity   Assist     Assist level: Supervision/Verbal cueing Assistive device: No Device   Walk 50 feet activity   Assist Walk 50 feet with 2 turns activity did not occur: Safety/medical concerns  Assist level: Supervision/Verbal cueing Assistive device: No Device    Walk 150 feet activity   Assist Walk 150 feet activity  did not occur: Safety/medical concerns  Assist level: Supervision/Verbal cueing Assistive device: No Device    Walk 10 feet on uneven surface  activity   Assist Walk 10 feet on uneven surfaces activity did not occur: Safety/medical concerns   Assist level: Supervision/Verbal cueing Assistive device:  (no device)   Wheelchair     Assist Will patient use wheelchair at discharge?: No Type of Wheelchair: Manual    Wheelchair assist level:  Supervision/Verbal cueing Max wheelchair distance: 75 ft    Wheelchair 50 feet with 2 turns activity    Assist        Assist Level: Supervision/Verbal cueing   Wheelchair 150 feet activity     Assist          Blood pressure 116/73, pulse 98, temperature 98.5 F (36.9 C), resp. rate 18, height 5\' 4"  (1.626 m), weight 77.9 kg, SpO2 100 %.  Medical Problem List and Plan: 1.  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 Associated infarct in left BG  Continue CIR  PRAFO  qhs   2.  B-PE/RLE DVT: Antithrombotics: -anticoagulation:  Pharmaceutical: Other (comment)--Eliquis             -antiplatelet therapy: N/a 3. Pain Management: Tylenol prn. Well controlled.  4. Mood: LCSW to follow for evaluation and support.              -antipsychotic agents: N/A 5. Neuropsych: This patient is not fully capable of making decisions on her own behalf. 6. Skin/Wound Care: Routine pressure relief measures.  7. Fluids/Electrolytes/Nutrition: Monitor I/Os.    -eating well  -protein supps for low albumin 8. Fevers: Inflammatory markers improving.   Felt to be due to clot burden/central.      resolved  UA unequivocal. Culture with insignificant growth.  9. SIADH/Hyponatremia: Resolved . Will continue to monitor with daily checks.   Sodium 138 9/17  -dc FR 10. HTN: Monitor BP tid--SBP goal <160.  Vitals:   06/17/20 0105 06/17/20 0446  BP: 116/63 116/73  Pulse: (!) 105 98  Resp: 14 18  Temp: 99.2 F (37.3 C) 98.5 F (36.9 C)  SpO2: 99% 100%    11. Resting tachycardia: Multifactorial deconditioning, post covid , mild anemia, check stool OB, has not had MP since admit, check orthostatic vitals   Improving, holding in 90's to 100, BPs may not support BB 12. Anemia/thrombocytosis: Will continue to monitor   Hemoglobin 10.6 on 9/10. Holding at 9.8 today 9/17  Platelets 450 on 9/10, 328 9/17 Check stool  guaic 13. Sick Euthyroid syndrome: Needs follow up thyroid panel in 4-6 weeks.  14. Occ urinary incontinence- appears to have resolved.  15.  Hypokalemia  Potassium 3.8 9/15  Continue supplementation daily supp    LOS: 10 days A FACE TO FACE EVALUATION WAS PERFORMED  10/15 06/17/2020, 8:50 AM

## 2020-06-18 ENCOUNTER — Inpatient Hospital Stay (HOSPITAL_COMMUNITY): Payer: BC Managed Care – PPO

## 2020-06-18 ENCOUNTER — Encounter (HOSPITAL_COMMUNITY): Payer: BC Managed Care – PPO | Admitting: Psychology

## 2020-06-18 ENCOUNTER — Inpatient Hospital Stay (HOSPITAL_COMMUNITY): Payer: BC Managed Care – PPO | Admitting: Physical Therapy

## 2020-06-18 NOTE — Progress Notes (Signed)
[facility] PHYSICAL MEDICINE & REHABILITATION PROGRESS NOTE   Subjective/Complaints:  Had early  Morning therapy session. Feels a little tired now.   ROS: Patient denies fever, rash, sore throat, blurred vision, nausea, vomiting, diarrhea, cough, shortness of breath or chest pain, joint or back pain, headache, or mood change.    Objective:   No results found. No results for input(s): WBC, HGB, HCT, PLT in the last 72 hours. No results for input(s): NA, K, CL, CO2, GLUCOSE, BUN, CREATININE, CALCIUM in the last 72 hours. No intake or output data in the 24 hours ending 06/18/20 7062   Physical Exam: Vital Signs Blood pressure 111/73, pulse (!) 101, temperature 98 F (36.7 C), resp. rate 15, height 5\' 4"  (1.626 m), weight 77.9 kg, SpO2 98 %.  Constitutional: No distress . Vital signs reviewed. HEENT: EOMI, oral membranes moist Neck: supple Cardiovascular: tachy without murmur. No JVD    Respiratory/Chest: CTA Bilaterally without wheezes or rales. Normal effort    GI/Abdomen: BS +, non-tender, non-distended Ext: no clubbing, cyanosis, or edema Psych: pleasant and cooperative Skin: No evidence of breakdown, no evidence of rash Musc: Lower extremity resolved.  No tenderness. Neuro:Alert and oriented x 3. Normal insight and awareness. Intact Memory. Normal language and speech. Cranial nerve exam unremarkable. Still some apraxia but better  Motor: RUE- proximally 4/5 in biceps, triceps and 4/5 in WE/grip and finger abd, decreased FMC.  RLE- HF 3 to 3+/5, KE/KF  3+/5, DF 3+/5. Senses pain and LT in all 4's.   Assessment/Plan: 1. Functional deficits secondary to acute left ischemic stroke, which require 3+ hours per day of interdisciplinary therapy in a comprehensive inpatient rehab setting.  Physiatrist is providing close team supervision and 24 hour management of active medical problems listed below.  Physiatrist and rehab team continue to assess barriers to discharge/monitor patient  progress toward functional and medical goals  Care Tool:  Bathing    Body parts bathed by patient: Right arm, Left arm, Chest, Abdomen, Front perineal area, Buttocks, Right upper leg, Left upper leg, Right lower leg, Left lower leg, Face         Bathing assist Assist Level: Contact Guard/Touching assist     Upper Body Dressing/Undressing Upper body dressing   What is the patient wearing?: Pull over shirt, Bra    Upper body assist Assist Level: Contact Guard/Touching assist    Lower Body Dressing/Undressing Lower body dressing      What is the patient wearing?: Underwear/pull up, Pants     Lower body assist Assist for lower body dressing: Minimal Assistance - Patient > 75%     Toileting Toileting    Toileting assist Assist for toileting: Contact Guard/Touching assist     Transfers Chair/bed transfer  Transfers assist     Chair/bed transfer assist level: Contact Guard/Touching assist     Locomotion Ambulation   Ambulation assist      Assist level: Supervision/Verbal cueing Assistive device: No Device Max distance: 400   Walk 10 feet activity   Assist     Assist level: Supervision/Verbal cueing Assistive device: No Device   Walk 50 feet activity   Assist Walk 50 feet with 2 turns activity did not occur: Safety/medical concerns  Assist level: Supervision/Verbal cueing Assistive device: No Device    Walk 150 feet activity   Assist Walk 150 feet activity did not occur: Safety/medical concerns  Assist level: Supervision/Verbal cueing Assistive device: No Device    Walk 10 feet on uneven surface  activity  Assist Walk 10 feet on uneven surfaces activity did not occur: Safety/medical concerns   Assist level: Supervision/Verbal cueing Assistive device:  (no device)   Wheelchair     Assist Will patient use wheelchair at discharge?: No Type of Wheelchair: Manual    Wheelchair assist level: Supervision/Verbal cueing Max  wheelchair distance: 75 ft    Wheelchair 50 feet with 2 turns activity    Assist        Assist Level: Supervision/Verbal cueing   Wheelchair 150 feet activity     Assist          Blood pressure 111/73, pulse (!) 101, temperature 98 F (36.7 C), resp. rate 15, height 5\' 4"  (1.626 m), weight 77.9 kg, SpO2 98 %.  Medical Problem List and Plan: 1.  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 Associated infarct in left BG  Continue CIR PT, OT, SLP  PRAFO  qhs   2.  B-PE/RLE DVT: Antithrombotics: -anticoagulation:  Pharmaceutical: Other (comment)--Eliquis             -antiplatelet therapy: N/a 3. Pain Management: Tylenol prn. Well controlled.  4. Mood: LCSW to follow for evaluation and support.              -antipsychotic agents: N/A 5. Neuropsych: This patient is not fully capable of making decisions on her own behalf. 6. Skin/Wound Care: Routine pressure relief measures.  7. Fluids/Electrolytes/Nutrition: Monitor I/Os.    -eating well  -protein supps for low albumin 8. Fevers: Inflammatory markers improving.   Felt to be due to clot burden/central.      resolved    9. SIADH/Hyponatremia: Resolved . Will continue to monitor with daily checks.   Sodium 138 9/17  -off FR 10. HTN: Monitor BP tid--SBP goal <160.  Vitals:   06/17/20 2127 06/18/20 0341  BP: 116/73 111/73  Pulse: (!) 101 (!) 101  Resp: 20 15  Temp: 98.1 F (36.7 C) 98 F (36.7 C)  SpO2: 100% 98%    11. Resting tachycardia: Multifactorial deconditioning, post covid , mild anemia, check stool OB, has not had MP since admit, check orthostatic vitals   Improving, holding in 90's to 100, asymptomatic 12. Anemia/thrombocytosis: Will continue to monitor   Hemoglobin 10.6 on 9/10. Holding at 9.8  9/17  Platelets 328 9/17 Check stool guaic 13. Sick Euthyroid syndrome: Needs follow up thyroid panel in 4-6 weeks.  14. Occ  urinary incontinence- appears to have resolved.  15.  Hypokalemia  Potassium 3.8 9/17  Continue supplementation daily supp    LOS: 11 days A FACE TO FACE EVALUATION WAS PERFORMED  10/17 06/18/2020, 9:27 AM

## 2020-06-18 NOTE — Plan of Care (Signed)
  Problem: Consults Goal: RH STROKE PATIENT EDUCATION Description: See Patient Education module for education specifics  Outcome: Progressing   Problem: RH BLADDER ELIMINATION Goal: RH STG MANAGE BLADDER WITH ASSISTANCE Description: STG Manage Bladder With  min Assistance Outcome: Progressing   Problem: RH SKIN INTEGRITY Goal: RH STG SKIN FREE OF INFECTION/BREAKDOWN Description: Manage with cues and reminders Outcome: Progressing   Problem: RH SAFETY Goal: RH STG ADHERE TO SAFETY PRECAUTIONS W/ASSISTANCE/DEVICE Description: STG Adhere to Safety Precautions With  cues/reminders Assistance/Device. Outcome: Progressing   Problem: RH PAIN MANAGEMENT Goal: RH STG PAIN MANAGED AT OR BELOW PT'S PAIN GOAL Description: At or below level 4 Outcome: Progressing   Problem: RH KNOWLEDGE DEFICIT Goal: RH STG INCREASE KNOWLEDGE OF STROKE PROPHYLAXIS Description: Patient will be able to manage stroke prophlyaxis using handouts and educational materials with cues and reminders.  Outcome: Progressing

## 2020-06-18 NOTE — Consult Note (Signed)
Neuropsychological Consultation   Patient:   Gail Montgomery   DOB:   [birth year]  MR Number:  [id number]  Location:  [facility] [address] CENTER B [address] [id number] [place] [address] Dept: [phone] Loc: [phone]           Date of Service:   06/18/2020  Start Time:   8 AM End Time:   9 AM  Provider/Observer:  Arley Phenix, Psy.D.       Clinical Neuropsychologist       Billing Code/Service: [phone]  Chief Complaint:    Gail Montgomery is a 24 year old female with history of anxiety and panic attacks and on OCP.  Patient was admitted on 05/19/2020 with sudden onset of right-sided weakness and difficulty speaking.  While the patient was fully vaccinated 1 month prior she was noted to be Covid positive at admission.  CT head showed dense left MCA and CTA head/neck showed emergent LVO at ICA terminus continuing into left MCA.  She underwent cerebral angio with thrombectomy of left MCA with flow.  Post procedure CT showed large subarachnoid hemorrhage with ventricular extension and received TXA and received cryoprecipitate to stop the bleeding.  MRI brain showed early subacute infarct in left basal ganglia and subarachnoid hemorrhage along with interhemispheric fissure into the lateral ventricles with early communicating hydrocephalus.  Patient treated conservatively and started on medications to manage edema.  Repeat CT head showed progression of hydrocephalus and continue to be treated conservatively and keeping blood pressure below 160.  Patient had DVT right femoral, PVD LGV.  Patient had ongoing lethargy and EEG done showing generalized slowing lateralized to left posterior with no seizure.  Patient had E. coli UTI that was treated with antibiotics.  Patient with resulting dysphagia that has improved with improving aphasia over time.  She also had right-sided weakness and apraxia affecting ADLs and recommended for CIR due to  functional decline.  Reason for Service:  Patient was referred for neuropsychological consultation due to coping and adjustment issues in the setting of a history of anxiety and panic attacks.  Below see HPI for the current admission.  HPI: Gail Montgomery is a 24 year old RH-female with history of anxiety, recreational marijuana use, on OCP who was admitted on 05/19/20 with sudden onset of right sided weakness and difficulty speaking.  Fully vaccinated a month ago but noted to be covid + at admission. CT head showed dense L-MCA and CTA head/neck showed emergent LVO at left ICA terminus continuing into L-MCA. She underwent cerebral angio with thrombectomy of L-MCA with flow . Post procedure CT head showed large SAH with ventricular extension and received TXA and cryoprecipitate to stop the bleeding. MRI brain showed early subacute infarct in left basal ganglia and SAH along interhemispheric fissure into lateral ventricles with early communicating hydrocephalus. No surgical intervention needed per Dr. Franky Macho and she was started on hypertonic saline to manage edema. On nimotop due to concerns of vasospasms. She self extubated on 08/22 and respiratory status stable/ no Covid symptoms.   Repeat CT head showed progression of hydrocephalus treated conservatively with nimodipine to keep SBP<160.  BLE dopplers negative for DVT. 2D echo showed EF 60-65% with increase in right atrial pressure and TCD showed evidence of PFO. Dr. Roda Shutters felt stroke embolic question ICA dissection v/s paradoxical emboli from PFO v/s Covid hyper oagulopathy.  She developed feverssT-max 103 on 08/25 and started on broad spectrum antibiotics 08/25-08/27 but continued to have fevers with  HR occasionally up to 150's.  Work up negative and antibiotics d/c on 08/29 as symptoms felt to be central v/s Covid related.  Repeat dopplers 08/30 showed acute DVT right femoral, PV and GV. CTA chest done showing bilateral segmental Left>Right PE with mild  cardiomegaly. She was started on IV heparin and transitioned to Eliquis on 09/03.    Due to ongoing lethargy, EEG done showing generalized slowing lateralized to left hemisphere--no seizures. Ceftriaxone added for E coli UTI but continued to have fevers repeat BC 09/05--negative X 4 days. Has completed 3 day course of ceftriaxone for UTI--?ESBL. 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 Dr. Rennis Golden recommends closure in the future. Patient with resultant dysphagia--advanced to regulars, aphasia, right sided weakness and apraxia affecting ADL tasks as well as mobility. CIR recommended due to functional decline.   Current Status:  The patient was sitting in her wheelchair alert and oriented upon entering the room.  The patient was showing good mental status/cognition throughout the visit.  The patient reports that she has made significant improvements and was very pleased with the recovery that she is making.  She has continued residual right-sided weakness but this is improving and she reports that she has been standing in therapies.  She reports that as she has improved that her anxiety has been better and she denied any recent panic attacks or events.  The patient had questions regarding when she would be able to drive again and about returning back to school.  She was very positive as far as her outlook into future and the progress that she has been making recently in therapies.  The patient will be going home in 2 days and has significant support from her mother who is a Therapist, sports.  Behavioral Observation: Gail Montgomery  presents as a 24 y.o.-year-old Right African American Female who appeared her stated age. her dress was Appropriate and she was Well Groomed and her manners were Appropriate to the situation.  her participation was indicative of Appropriate and Attentive behaviors.  There were any physical  disabilities noted.  she displayed an appropriate level of cooperation and motivation.     Interactions:    Active Appropriate and Attentive  Attention:   within normal limits and attention span and concentration were age appropriate  Memory:   within normal limits; recent and remote memory intact  Visuo-spatial:  not examined  Speech (Volume):  normal  Speech:   normal; normal  Thought Process:  Coherent and Relevant  Though Content:  WNL; not suicidal and not homicidal  Orientation:   person, place, time/date and situation  Judgment:   Good  Planning:   Fair  Affect:    Anxious  Mood:    Anxious  Insight:   Good  Intelligence:   normal  Substance Use:  Reports of previous recreational marijuana use.  Education:   Control and instrumentation engineer History:   Past Medical History:  Diagnosis Date  . Anxiety   . Panic attack     Psychiatric History:  Patient has a prior history of anxiety and panic attacks but reports that her anxiety has been improving with her improvement from her stroke and denies any recent panic events.  Family Med/Psych History:  Family History  Problem Relation Age of Onset  . Healthy Mother   . Healthy Father   . Cancer Maternal Grandmother        renal  .  Heart disease Maternal Grandfather    Impression/DX:  Gail Montgomery is a 24 year old female with history of anxiety and panic attacks and on OCP.  Patient was admitted on 05/19/2020 with sudden onset of right-sided weakness and difficulty speaking.  While the patient was fully vaccinated 1 month prior she was noted to be Covid positive at admission.  CT head showed dense left MCA and CTA head/neck showed emergent LVO at ICA terminus continuing into left MCA.  She underwent cerebral angio with thrombectomy of left MCA with flow.  Post procedure CT showed large subarachnoid hemorrhage with ventricular extension and received TXA and received cryoprecipitate to stop the bleeding.  MRI brain showed early  subacute infarct in left basal ganglia and subarachnoid hemorrhage along with interhemispheric fissure into the lateral ventricles with early communicating hydrocephalus.  Patient treated conservatively and started on medications to manage edema.  Repeat CT head showed progression of hydrocephalus and continue to be treated conservatively and keeping blood pressure below 160.  Patient had DVT right femoral, PVD LGV.  Patient had ongoing lethargy and EEG done showing generalized slowing lateralized to left posterior with no seizure.  Patient had E. coli UTI that was treated with antibiotics.  Patient with resulting dysphagia that has improved with improving aphasia over time.  She also had right-sided weakness and apraxia affecting ADLs and recommended for CIR due to functional decline.  The patient was sitting in her wheelchair alert and oriented upon entering the room.  The patient was showing good mental status/cognition throughout the visit.  The patient reports that she has made significant improvements and was very pleased with the recovery that she is making.  She has continued residual right-sided weakness but this is improving and she reports that she has been standing in therapies.  She reports that as she has improved that her anxiety has been better and she denied any recent panic attacks or events.  The patient had questions regarding when she would be able to drive again and about returning back to school.  She was very positive as far as her outlook into future and the progress that she has been making recently in therapies.  The patient will be going home in 2 days and has significant support from her mother who is a Therapist, sports.  Disposition/Plan:  Today we worked on coping and adjustment issues and addressed issues around her history of anxiety/panic attacks.  The patient is doing very well and will be followed up after discharge.  Patient is going to be living with her mother in [place].  She is anxious about returning to driving etc. and we addressed this issue and caution about trying to resume driving too soon after her stroke.  Diagnosis:    Acute ischemic left MCA stroke [facility]) - Plan: Ambulatory referral to Physical Medicine Rehab         Electronically Signed   _______________________ Arley Phenix, Psy.D.

## 2020-06-18 NOTE — Progress Notes (Signed)
Speech Language Pathology Daily Session Note  Patient Details  Name: Gail Montgomery MRN: [id number] Date of Birth: Jan 29, 1996  Today's Date: 06/18/2020 SLP Individual Time: 1100-1200 SLP Individual Time Calculation (min): 60 min  Short Term Goals: Week 2: SLP Short Term Goal 1 (Week 2): STGs=LTGs due to ELOS  Skilled Therapeutic Interventions: Skilled SLP intervention focused on cognition. Therapy completed in slp office using computer. Pt navigated internet to find information on medication purpose and common side effects for her medications. Pt recalled reviewing medications with a list in previous SLP session but stated she was unable to recall any of the purposes for her medications. Pt required min-mod A with verbal and visual cues due to decreased attention and overlooking the information requested when scrolling webpages. Pt provided with list of functional cognitive tasks to complete once home and given instruction on how to make tasks more challenging and similar to college level tasks. Pt returned to room and leaft seated upright in bed with bed alarm set and family member present. Cont with therapy per plan of care.      Pain Pain Assessment Pain Scale: Faces Faces Pain Scale: No hurt  Therapy/Group: Individual Therapy  Carlean Jews Jionni Helming 06/18/2020, 12:29 PM

## 2020-06-18 NOTE — Progress Notes (Signed)
Occupational Therapy Session Note  Patient Details  Name: Gail Montgomery MRN: [id number] Date of Birth: 06-27-1996  Today's Date: 06/18/2020 OT Individual Time: 1305-1400 OT Individual Time Calculation (min): 55 min    Short Term Goals: Week 2:  OT Short Term Goal 2 (Week 2): STG=LTG d/t ELOS  Skilled Therapeutic Interventions/Progress Updates:    Pt received EOB with mother present. Pt agreeable to OT session. No c/o pain. Pt's mother reported that w/c in room has defective/broken brake. w/c was changed out for a functioning one. Pt completed 200 ft of functional mobility at mod I level. She sat at high table and completed grocery planning activity with [facility] component- typing and handwriting for carryover to vocation skills. Pt completed task at (S) level overall with occasional questioning cues for initiating problem solving. Pt then completed ambulatory transfer to the ADL apt with mod I. She completed simulated cooking task and education was provided re safety and fall risk. Pt able to return demonstration and boil water with (S) overall- no safety concerns. Pt then completed full body strengthening/endurance based circuit with focus on glute strength, coordination, and endurance. Pt required (S) when completing resistive hip abduction walks. Pt returned to her room and was left sitting EOB with her mother present.    Therapy Documentation Precautions:  Precautions Precautions: Fall Precaution Comments: Impulsive; Right sided weakness (LE>UE) Restrictions Weight Bearing Restrictions: No   Therapy/Group: Individual Therapy  Crissie Reese 06/18/2020, 6:43 AM

## 2020-06-18 NOTE — Progress Notes (Signed)
Physical Therapy Session Note  Patient Details  Name: Modean Mccullum MRN: [id number] Date of Birth: 01-Nov-1995  Today's Date: 06/18/2020 PT Individual Time: 3875-6433 PT Individual Time Calculation (min): 61 min   Short Term Goals: Week 2:  PT Short Term Goal 1 (Week 2): STGs=LTGs  Skilled Therapeutic Interventions/Progress Updates: Pt presented in bed agreeable to therapy. Pt denies pain during session. Session focused on dynamic balance and coordination. Pt ambulated to rehab gym with supervision and min verbal cues for increased R arm swing for normalization of gait. Pt participated in agility ladder activities including in/outs, zig/zags, and backwards steps with emphasis on leading with RLE. HR monitored during activity with max HR 128 and seated rests provided between bouts. Pt then ambulated to day room and participated in Wii boxing while standing on [employer]. Pt demonstrated good balance with the ability to perform mini-squats and lateral wt shifting while using BUE to swing/jab/block. HR assessed after x 3 rounds at 140 with immediate drop to 136 and return to 110s within 2 min. Pt then participated in Wii bowling while standing on [employer]ire[employer] tolerating x 10 frames with HR high 120s after game. Pt then ambulated back to room supervision and pt left at bathroom per pt request with mom present.      Therapy Documentation Precautions:  Precautions Precautions: Fall Precaution Comments: Impulsive; Right sided weakness (LE>UE) Restrictions Weight Bearing Restrictions: No General:   Vital Signs: Therapy Vitals Temp: 98.1 F (36.7 C) Pulse Rate: 100 Resp: 18 BP: 108/69 Patient Position (if appropriate): Lying Oxygen Therapy SpO2: 100 % O2 Device: Room Air   Therapy/Group: Individual Therapy  [facility]  Lynnex Fulp, PTA  06/18/2020, 4:39 PM

## 2020-06-19 ENCOUNTER — Inpatient Hospital Stay (HOSPITAL_COMMUNITY): Payer: BC Managed Care – PPO | Admitting: Physical Therapy

## 2020-06-19 ENCOUNTER — Inpatient Hospital Stay (HOSPITAL_COMMUNITY): Payer: BC Managed Care – PPO

## 2020-06-19 ENCOUNTER — Inpatient Hospital Stay (HOSPITAL_COMMUNITY): Payer: BC Managed Care – PPO | Admitting: Speech Pathology

## 2020-06-19 ENCOUNTER — Ambulatory Visit (HOSPITAL_COMMUNITY): Payer: BC Managed Care – PPO | Admitting: *Deleted

## 2020-06-19 MED ORDER — B COMPLEX-C PO TABS
1.0000 | ORAL_TABLET | Freq: Every day | ORAL | Status: DC
  Administered 2020-06-20: 1 via ORAL
  Filled 2020-06-19: qty 1

## 2020-06-19 NOTE — Progress Notes (Signed)
Recreational Therapy Session Note  Patient Details  Name: Gail Montgomery MRN: [id number] Date of Birth: [birth year] Today's Date: 06/19/2020  Pain: no c/o Skilled Therapeutic Interventions/Progress Updates: Session focused on community reintegration during cotreat with OT.  Pt ambulated throughout the hospital including cafeteria and [employer] at supervision ambulatory level.  Pt educated on safety concerns during community mobility, energy conservation techniques, public rest room access and overall safety.  Pt stood to order food, retrieve and carry food, and make her fountain drink with supervision.  Pt ambulated back to her room carrying all items with supervision without LOB.  During seated rest breaks, discussed discharge planning, not driving until MD approves, identifying support system to assist in coping once home, coping strategies and sexual health.  Encouraged pt to have conversations with treatment team and her mother about the above listed topics.  Pt stated understanding and appreciation of the above activity and education.  Therapy/Group: Co-Treatment Kameah Rawl 06/19/2020, 3:42 PM

## 2020-06-19 NOTE — Progress Notes (Signed)
Physical Therapy Discharge Summary  Patient Details  Name: Gail Montgomery MRN: [id number] Date of Birth: 1996-07-28  Patient has met 9 of 9 long term goals due to improved activity tolerance, improved balance, improved postural control, increased strength, improved attention, improved awareness and improved coordination.  Patient to discharge at an ambulatory level Supervision.   Patient's care partner is independent to provide the necessary physical assistance at discharge.  Reasons goals not met: N/A all goals met  Recommendation:  Patient will benefit from ongoing skilled PT services in outpatient setting to continue to advance safe functional mobility, address ongoing impairments in higher level gait training, high level dynamic standing balance, cardiopulmonary endurance, and minimize fall risk.  Equipment: No equipment provided - none needed  Reasons for discharge: treatment goals met  Patient/family agrees with progress made and goals achieved: Yes  PT Discharge Precautions/Restrictions Precautions Precautions: Fall Restrictions Weight Bearing Restrictions: No Pain Pain Assessment Pain Scale: 0-10 Pain Score: 0-No pain Vision/Perception  Perception Perception: Within Functional Limits Praxis Praxis: Intact  Cognition Overall Cognitive Status: Impaired/Different from baseline Arousal/Alertness: Awake/alert Orientation Level: Oriented X4 Attention: Selective Sustained Attention: Appears intact Selective Attention: Impaired Selective Attention Impairment: Functional complex Memory: Impaired Memory Impairment: Decreased short term memory Decreased Short Term Memory: Functional complex Awareness: Impaired Awareness Impairment: Anticipatory impairment Problem Solving: Impaired Problem Solving Impairment: Functional complex Safety/Judgment: Appears intact Sensation Sensation Light Touch: Appears Intact Hot/Cold: Appears Intact Proprioception: Appears  Intact Coordination Gross Motor Movements are Fluid and Coordinated: Yes Fine Motor Movements are Fluid and Coordinated: Yes Coordination and Movement Description: Functional despite higher level balance and coordination deficits Motor  Motor Motor: Hemiplegia Motor - Discharge Observations: Mostly resolved R hemiplegia, RUE<RLUE  Mobility Bed Mobility Bed Mobility: Rolling Right;Rolling Left;Right Sidelying to Sit Rolling Right: Independent Rolling Left: Independent Right Sidelying to Sit: Independent Sit to Supine: Independent Transfers Transfers: Sit to Stand;Stand Pivot Transfers;Stand to Sit Sit to Stand: Independent Stand to Sit: Independent Stand Pivot Transfers: Independent Transfer (Assistive device): None Locomotion  Gait Ambulation: Yes Gait Assistance: Supervision/Verbal cueing Gait Distance (Feet): 150 Feet Assistive device: None Gait Gait: Yes Gait Pattern: Within Functional Limits Gait Pattern: Within Functional Limits Stairs / Additional Locomotion Stairs: Yes Stairs Assistance: Supervision/Verbal cueing Stair Management Technique: Two rails Number of Stairs: 12 Height of Stairs: 6 Ramp: Supervision/Verbal cueing Curb: Supervision/Verbal cueing Wheelchair Mobility Wheelchair Mobility: No  Trunk/Postural Assessment  Cervical Assessment Cervical Assessment: Within Functional Limits Thoracic Assessment Thoracic Assessment: Within Functional Limits Lumbar Assessment Lumbar Assessment: Within Functional Limits Postural Control Postural Control: Deficits on evaluation Righting Reactions: improved from eval Protective Responses: improved from eval  Balance Balance Balance Assessed: Yes Standardized Balance Assessment Standardized Balance Assessment: Berg Balance Test Berg Balance Test Sit to Stand: Able to stand without using hands and stabilize independently Standing Unsupported: Able to stand safely 2 minutes Sitting with Back Unsupported but  Feet Supported on Floor or Stool: Able to sit safely and securely 2 minutes Stand to Sit: Sits safely with minimal use of hands Transfers: Able to transfer safely, minor use of hands Standing Unsupported with Eyes Closed: Able to stand 10 seconds safely Standing Ubsupported with Feet Together: Able to place feet together independently and stand 1 minute safely From Standing, Reach Forward with Outstretched Arm: Can reach confidently >25 cm (10") From Standing Position, Pick up Object from Floor: Able to pick up shoe safely and easily From Standing Position, Turn to Look Behind Over each Shoulder: Looks behind from both sides and weight shifts  well Turn 360 Degrees: Able to turn 360 degrees safely in 4 seconds or less Standing Unsupported, Alternately Place Feet on Step/Stool: Able to stand independently and safely and complete 8 steps in 20 seconds Standing Unsupported, One Foot in Front: Able to plae foot ahead of the other independently and hold 30 seconds Standing on One Leg: Able to lift leg independently and hold 5-10 seconds Total Score: 54 Static Sitting Balance Static Sitting - Balance Support: Feet supported Static Sitting - Level of Assistance: 7: Independent Dynamic Sitting Balance Dynamic Sitting - Balance Support: Feet supported Dynamic Sitting - Level of Assistance: 7: Independent Static Standing Balance Static Standing - Balance Support: During functional activity;No upper extremity supported Static Standing - Level of Assistance: 7: Independent Dynamic Standing Balance Dynamic Standing - Balance Support: During functional activity Dynamic Standing - Level of Assistance: 6: Modified independent (Device/Increase time) Extremity Assessment  RUE Assessment RUE Assessment: Within Functional Limits General Strength Comments: Very slight gross motor weakness and deficits in higher level handwriting- overall WFL LUE Assessment LUE Assessment: Within Functional Limits RLE  Assessment RLE Assessment: Exceptions to [facility] General Strength Comments: hip flexion 3+/5, knee extension 4/5, knee flexion 4-/5, DF 4/5 LLE Assessment LLE Assessment: Within Functional Limits  Page Spiro, PT, DPT, CSRS Rosita DeChalus, PTA 06/19/2020, 12:49 PM

## 2020-06-19 NOTE — Progress Notes (Signed)
Recreational Therapy Session Note  Patient Details  Name: Gail Montgomery MRN: [id number] Date of Birth: 12-Aug-1996 Today's Date: 06/19/2020  Pain: no c/o Skilled Therapeutic Interventions/Progress Updates: Session focused on identifying coping strategies to assist in her transition home with her family.  Pam, PA present and participatory in the discussion.  Pt states a need for social support from her friends and hopes that she will be able to return to some normalcy socially once home.  Encouraged communication with her family and friends about her needs and how they could support her as she continues through rehab and recovery.  Encouraged pt to use her notebook to write down her thoughts and feelings in preparation for conversations with her support team.  Pt requested and listed questions she would like to discuss with her mom.  Discussed the importance of self monitoring and making appropriate decisions for her overall health.  Pt ambulated throughout the unit with supervision.  When returned to the room, pt initiated discussion with her mom on the questions she had listed.  Mom open and receptive to pt concerns and provided answers and explanations on how she would continue to support her once home.  Education provided on the importance of open communication once home and its impact on further recovery.  Both stated understanding.  Therapy/Group: Individual Therapy   Kolbee Stallman 06/19/2020, 3:51 PM

## 2020-06-19 NOTE — Progress Notes (Signed)
Physical Therapy Session Note  Patient Details  Name: Gail Montgomery MRN: [id number] Date of Birth: 21-Oct-1995  Today's Date: 06/19/2020 PT Individual Time: 2831-5176 PT Individual Time Calculation (min): 53 min   Short Term Goals: Week 2:  PT Short Term Goal 1 (Week 2): STGs=LTGs  Skilled Therapeutic Interventions/Progress Updates:    Pt received sitting in recliner with her mom present and pt agreeable to therapy session. Sit<>stands independently throughout session. Pt denies any questions/concerns regarding her mobility and reports feeling prepared for discharge. Ambulated ~29ft to therapy gym, no AD, with supervision - slightly caught R toes 1x but able to maintain balance without assist or significant unsteadiness. Educated on fall risk safety and how to perform floor transfer as pt reports slight difficulty with this on prior attempts - pt demoed proper floor transfer technique with supervision and min cuing. Ambulated ~281ft to day room with CGA for safety while participating in dynamic gait challenges of R/L lateral side stepping, sudden R/L turns, backwards walking, and head turns - no LOB. Participated in Wii sports games focusing on dynamic standing balance via boweling with pt demoing ability to perform stepping forward/backwards with supervision for safety and only ~3x slight unsteadiness with pt able to correct without hands-on assistance. HR 120bpm and provided seated rest breaks as needed. Ambulated ~478ft back to room with supervision and pt catching R toes 2x with ability to stabilize self without hands-on assistance. Pt left standing in the room under the supervision of her mother.   Therapy Documentation Precautions:  Precautions Precautions: Fall Precaution Comments: Impulsive; Right sided weakness (LE>UE) Restrictions Weight Bearing Restrictions: No  Pain: No reports of pain throughout session.   Therapy/Group: Individual Therapy  Ginny Forth , PT, DPT,  CSRS  06/19/2020, 3:25 PM

## 2020-06-19 NOTE — Progress Notes (Signed)
Patient ID: [name], female   DOB: [birth year], 24 y.o.   MRN: [id number]  SW met with pt and pt mother to provide updates from team conference, and d/c will remain 9/23. Mother does confirm she has received follow-up from [facility] about outpatient therapy.   Loralee Pacas, MSW, [place] Office: [phone] Cell: [phone] Fax: [phone]

## 2020-06-19 NOTE — Progress Notes (Signed)
Physical Therapy Session Note  Patient Details  Name: [name] MRN: [id number] Date of Birth: November 06, 1995  Today's Date: 06/19/2020 PT Individual Time: 0917-1005 PT Individual Time Calculation (min): 48 min   Short Term Goals: Week 2:  PT Short Term Goal 1 (Week 2): STGs=LTGs  Skilled Therapeutic Interventions/Progress Updates: Pt presented in bed agreeable to therapy. Pt performed bed mobility independently and ambulated to ortho gym with supervision without AD. Pt participated in car transfer gait on ramp and curb step with distant supervision. After seated rest ambulated to rehab gym and ascended/descended x 12 steps with B rails HR checked after stairs 142 with immediate decrease upon sitting. Pt then participated in obstacle course including weaving through cones, stepping over thresholds, and gait on compliant surface all performed with close supervision. Pt retested on Berg Balance assessment with improved score of 54/56 indicating decreased fall risk from initial assessment.  (<36= high risk for falls, close to 100%; 37-45 significant >80%; 46-51 moderate >50%; 52-55 lower >25%) Pt with greatest weakness for SLS activities and narrow BOS. Pt then ambulated back to room at end of session and left at bedside with mom present and needs met.       Therapy Documentation Precautions:  Precautions Precautions: Fall Precaution Comments: Impulsive; Right sided weakness (LE>UE) Restrictions Weight Bearing Restrictions: No General:   Vital Signs: Therapy Vitals Temp: 98.5 F (36.9 C) Pulse Rate: (!) 106 Resp: 18 BP: 109/76 Patient Position (if appropriate): Sitting Oxygen Therapy SpO2: 98 % Pain: Pain Assessment Pain Scale: 0-10 Pain Score: 0-No pain   Balance: Balance Balance Assessed: Yes Static Sitting Balance Static Sitting - Balance Support: Feet supported Static Sitting - Level of Assistance: 7: Independent Dynamic Sitting Balance Dynamic Sitting - Balance Support:  Feet supported Dynamic Sitting - Level of Assistance: 7: Independent Static Standing Balance Static Standing - Balance Support: During functional activity;No upper extremity supported Static Standing - Level of Assistance: 7: Independent Dynamic Standing Balance Dynamic Standing - Balance Support: During functional activity Dynamic Standing - Level of Assistance: 6: Modified independent (Device/Increase time)    Therapy/Group: Individual Therapy  Gail Montgomery  Gail Montgomery, PTA  06/19/2020, 3:57 PM

## 2020-06-19 NOTE — Progress Notes (Signed)
[facility] PHYSICAL MEDICINE & REHABILITATION PROGRESS NOTE   Subjective/Complaints:  Up in bathroom. No new complaints. Pleased with progress. Excited about getting home tomorrow!  ROS: Patient denies fever, rash, sore throat, blurred vision, nausea, vomiting, diarrhea, cough, shortness of breath or chest pain, joint or back pain, headache, or mood change.   Objective:   No results found. No results for input(s): WBC, HGB, HCT, PLT in the last 72 hours. No results for input(s): NA, K, CL, CO2, GLUCOSE, BUN, CREATININE, CALCIUM in the last 72 hours.  Intake/Output Summary (Last 24 hours) at 06/19/2020 0949 Last data filed at 06/19/2020 0741 Gross per 24 hour  Intake 840 ml  Output --  Net 840 ml     Physical Exam: Vital Signs Blood pressure 103/64, pulse 90, temperature 98.2 F (36.8 C), resp. rate 16, height 5\' 4"  (1.626 m), weight 77.9 kg, SpO2 99 %.  Constitutional: No distress . Vital signs reviewed. HEENT: EOMI, oral membranes moist Neck: supple Cardiovascular: RRR without murmur. No JVD    Respiratory/Chest: CTA Bilaterally without wheezes or rales. Normal effort    GI/Abdomen: BS +, non-tender, non-distended Ext: no clubbing, cyanosis, or edema Psych: pleasant and cooperative Skin: No evidence of breakdown, no evidence of rash Musc: Lower extremity resolved.  No tenderness. Neuro:Alert and oriented x 3. Normal insight and awareness. Intact Memory. Normal language and speech. Cranial nerve exam unremarkable. Still some apraxia but better  Motor: RUE- proximally 4/5 in biceps, triceps and 4/5 in WE/grip and finger abd, decreased FMC.  RLE- HF 4 /5, KE/KF  4/5, DF 4/5. Drags the leg slightly during swing. Senses pain and LT in all 4's.   Assessment/Plan: 1. Functional deficits secondary to acute left ischemic stroke, which require 3+ hours per day of interdisciplinary therapy in a comprehensive inpatient rehab setting.  Physiatrist is providing close team supervision  and 24 hour management of active medical problems listed below.  Physiatrist and rehab team continue to assess barriers to discharge/monitor patient progress toward functional and medical goals  Care Tool:  Bathing    Body parts bathed by patient: Right arm, Left arm, Chest, Abdomen, Front perineal area, Buttocks, Right upper leg, Left upper leg, Right lower leg, Left lower leg, Face         Bathing assist Assist Level: Contact Guard/Touching assist     Upper Body Dressing/Undressing Upper body dressing   What is the patient wearing?: Pull over shirt, Bra    Upper body assist Assist Level: Contact Guard/Touching assist    Lower Body Dressing/Undressing Lower body dressing      What is the patient wearing?: Underwear/pull up, Pants     Lower body assist Assist for lower body dressing: Minimal Assistance - Patient > 75%     Toileting Toileting    Toileting assist Assist for toileting: Contact Guard/Touching assist     Transfers Chair/bed transfer  Transfers assist     Chair/bed transfer assist level: Contact Guard/Touching assist     Locomotion Ambulation   Ambulation assist      Assist level: Supervision/Verbal cueing Assistive device: No Device Max distance: 400   Walk 10 feet activity   Assist     Assist level: Supervision/Verbal cueing Assistive device: No Device   Walk 50 feet activity   Assist Walk 50 feet with 2 turns activity did not occur: Safety/medical concerns  Assist level: Supervision/Verbal cueing Assistive device: No Device    Walk 150 feet activity   Assist Walk 150 feet activity  did not occur: Safety/medical concerns  Assist level: Supervision/Verbal cueing Assistive device: No Device    Walk 10 feet on uneven surface  activity   Assist Walk 10 feet on uneven surfaces activity did not occur: Safety/medical concerns   Assist level: Supervision/Verbal cueing Assistive device:  (no device)    Wheelchair     Assist Will patient use wheelchair at discharge?: No Type of Wheelchair: Manual    Wheelchair assist level: Supervision/Verbal cueing Max wheelchair distance: 75 ft    Wheelchair 50 feet with 2 turns activity    Assist        Assist Level: Supervision/Verbal cueing   Wheelchair 150 feet activity     Assist          Blood pressure 103/64, pulse 90, temperature 98.2 F (36.8 C), resp. rate 16, height 5\' 4"  (1.626 m), weight 77.9 kg, SpO2 99 %.  Medical Problem List and Plan: 1.  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 Associated infarct in left BG  Continue CIR PT, OT, SLP  PRAFO  qhs  -dc home tomorrow!  -Patient to see me in the office for transitional care encounter in 1-2 weeks. Also needs neuro f/u 2.  B-PE/RLE DVT: Antithrombotics: -anticoagulation:  Pharmaceutical: Other (comment)--Eliquis             -antiplatelet therapy: N/a 3. Pain Management: Tylenol prn. Well controlled.  4. Mood: LCSW to follow for evaluation and support.              -antipsychotic agents: N/A 5. Neuropsych: This patient is not fully capable of making decisions on her own behalf. 6. Skin/Wound Care: Routine pressure relief measures.  7. Fluids/Electrolytes/Nutrition: Monitor I/Os.    -eating well  -protein supps  8. Fevers: Inflammatory markers improving.   Felt to be due to clot burden/central.      resolved    9. SIADH/Hyponatremia: Resolved . Will continue to monitor with daily checks.   Sodium 138 9/17  -off FR 10. HTN: Monitor BP tid--SBP goal <160.  Vitals:   06/18/20 2026 06/19/20 0545  BP: 116/75 103/64  Pulse: (!) 105 90  Resp: 16 16  Temp: 98.8 F (37.1 C) 98.2 F (36.8 C)  SpO2: 100% 99%    11. Resting tachycardia: Multifactorial deconditioning, post covid , mild anemia, check stool OB, has not had MP since admit, check orthostatic vitals    Improving, holding in 90's to 100. Remains asymptomatic 12. Anemia/thrombocytosis: Will continue to monitor   Hemoglobin 10.6 on 9/10. Holding at 9.8  9/17  Platelets 328 9/17  Check stool guaic 13. Sick Euthyroid syndrome: Needs follow up thyroid panel in 4-6 weeks.  14. Occ urinary incontinence- appears to have resolved.  15.  Hypokalemia  Potassium 3.8 9/17  Continue supplementation daily     LOS: 12 days A FACE TO FACE EVALUATION WAS PERFORMED  10/17 06/19/2020, 9:49 AM

## 2020-06-19 NOTE — Progress Notes (Signed)
Speech Language Pathology Discharge Summary  Patient Details  Name: Gail Montgomery MRN: [id number] Date of Birth: 30-May-1996  Today's Date: 06/19/2020 SLP Individual Time: 1300-1355 SLP Individual Time Calculation (min): 55 min   Skilled Therapeutic Interventions:  Skilled treatment session focused on cognitive goals. SLP facilitated session by re-administering the MoCA-version 7.3. Patient scored 26/30 points with a score of 26 or above considered normal. Patient demonstrated deficits in verbal fluency and short-term recall. SLP also facilitated session with a novel, complex online billing task in which she completed with Min A verbal cues. Patient handed off to RT.    Patient has met 5 of 5 long term goals.  Patient to discharge at overall Supervision level.   Reasons goals not met: N/A   Clinical Impression/Discharge Summary: Patient has made excellent gains and has met 5 of 5 LTGs this admission. Currently, patient is consuming regular textures with thin liquids without overt s/s of aspiration and is overall Mod I for use of swallowing compensatory strategies. Patient is also overall Mod I for verbal expression and auditory comprehension of abstract and complex thoughts. Patient's overall cognitive functioning has improved and patient requires overall supervision level verbal cues to complete functional and mildly complex tasks safely in regards to problem solving, recall with use of strategies and attention. Patient and family education is complete and patient will discharge home with 24 hour supervision from family. Patient would benefit from f/u SLP services to maximize her cognitive functioning and overall functional independence prior to discharge.   Care Partner:  Caregiver Able to Provide Assistance: Yes  Type of Caregiver Assistance: Physical;Cognitive  Recommendation:  Outpatient SLP;24 hour supervision/assistance  Rationale for SLP Follow Up: Maximize cognitive function and  independence;Reduce caregiver burden   Equipment: N/A   Reasons for discharge: Discharged from hospital;Treatment goals met   Patient/Family Agrees with Progress Made and Goals Achieved: Yes    Lovelle Lema 06/19/2020, 6:43 AM

## 2020-06-19 NOTE — Progress Notes (Signed)
Occupational Therapy Discharge Summary  Patient Details  Name: Gail Montgomery MRN: [id number] Date of Birth: [birth year]  Today's Date: 06/19/2020 OT Individual Time: 1115-1200 OT Individual Time Calculation (min): 45 min    Patient has met 9 of 9 long term goals due to improved activity tolerance, improved balance, postural control, ability to compensate for deficits, functional use of  RIGHT upper and RIGHT lower extremity, improved attention, improved awareness and improved coordination.  Patient to discharge at overall Supervision level.  Patient's care partner is independent to provide the necessary physical and cognitive assistance at discharge.    Reasons goals not met: All treatment goals met.   Recommendation:  Patient will benefit from ongoing skilled OT services in outpatient setting to continue to advance functional skills in the area of iADL, School/Education and Vocation.  Equipment: No equipment provided  Reasons for discharge: treatment goals met and discharge from hospital  Patient/family agrees with progress made and goals achieved: Yes   Skilled OT intervention: Session focused on community outings with co-treat with recreational therapist. Pt completed 1000+ ft of functional mobility throughout session at mod I level. Education provided throughout included energy conservation strategies, fall risk reduction, rest breaks, community accessibility, and need to continue engaging in preferred IADLs at community level. Pt was able to maintain selective attention to task despite crowded cafeteria and other hospital staff interacting with pt. Pt was able to sequence through steps of [employer] ordering and interacting with cashier appropriately. Pt completed 400 ft of functional mobility while holding lunch items in both hands, with no LOB or safety concerns. Discussed sexual health and coordinated care with PA to discuss birth control options and return to desired activities. Pt  returned to her room and was left with her mother present.   OT Discharge Precautions/Restrictions  Precautions Precautions: Fall Restrictions Weight Bearing Restrictions: No    Pain Pain Assessment Pain Scale: 0-10 Pain Score: 0-No pain ADL ADL Eating: Modified independent Where Assessed-Eating: Chair Grooming: Modified independent Where Assessed-Grooming: Standing at sink Upper Body Bathing: Modified independent Where Assessed-Upper Body Bathing: Shower Lower Body Bathing: Modified independent Where Assessed-Lower Body Bathing: Shower Upper Body Dressing: Modified independent (Device) Where Assessed-Upper Body Dressing: Edge of bed Lower Body Dressing: Supervision/safety Where Assessed-Lower Body Dressing: Chair Toileting: Supervision/safety Where Assessed-Toileting: Glass blower/designer: Distant supervision Armed forces technical officer Method: Counselling psychologist: Geophysical data processor: Close supervision Social research officer, government Method: Heritage manager: Civil engineer, contracting with back Vision Baseline Vision/History: Wears glasses Wears Glasses: At all times Patient Visual Report: No change from baseline Vision Assessment?: No apparent visual deficits Perception  Perception: Within Functional Limits Praxis Praxis: Intact Cognition Overall Cognitive Status: Impaired/Different from baseline Arousal/Alertness: Awake/alert Orientation Level: Oriented X4 Attention: Selective Sustained Attention: Appears intact Selective Attention: Impaired Selective Attention Impairment: Functional complex Memory: Impaired Memory Impairment: Decreased short term memory Decreased Short Term Memory: Functional complex Awareness: Impaired Awareness Impairment: Anticipatory impairment Problem Solving: Impaired Problem Solving Impairment: Functional complex Safety/Judgment: Appears intact Sensation Sensation Light Touch: Appears Intact Hot/Cold:  Appears Intact Proprioception: Appears Intact Coordination Gross Motor Movements are Fluid and Coordinated: Yes Fine Motor Movements are Fluid and Coordinated: Yes Coordination and Movement Description: Functional despite higher level balance and coordination deficits Motor  Motor Motor: Hemiplegia Motor - Discharge Observations: Mostly resolved R hemiplegia, RUE<RLUE Mobility  Bed Mobility Bed Mobility: Rolling Right;Rolling Left;Right Sidelying to Sit Rolling Right: Independent Rolling Left: Independent Right Sidelying to Sit: Independent Sit to Supine: Independent Transfers Sit to Stand:  Independent Stand to Sit: Independent  Trunk/Postural Assessment  Cervical Assessment Cervical Assessment: Within Functional Limits Thoracic Assessment Thoracic Assessment: Within Functional Limits Lumbar Assessment Lumbar Assessment: Within Functional Limits Postural Control Postural Control: Deficits on evaluation (slight delays)  Balance Balance Balance Assessed: Yes Standardized Balance Assessment Standardized Balance Assessment: Berg Balance Test Berg Balance Test Sit to Stand: Able to stand without using hands and stabilize independently Standing Unsupported: Able to stand safely 2 minutes Sitting with Back Unsupported but Feet Supported on Floor or Stool: Able to sit safely and securely 2 minutes Stand to Sit: Sits safely with minimal use of hands Transfers: Able to transfer safely, minor use of hands Standing Unsupported with Eyes Closed: Able to stand 10 seconds safely Standing Ubsupported with Feet Together: Able to place feet together independently and stand 1 minute safely From Standing, Reach Forward with Outstretched Arm: Can reach confidently >25 cm (10") From Standing Position, Pick up Object from Floor: Able to pick up shoe safely and easily From Standing Position, Turn to Look Behind Over each Shoulder: Looks behind from both sides and weight shifts well Turn 360  Degrees: Able to turn 360 degrees safely in 4 seconds or less Standing Unsupported, Alternately Place Feet on Step/Stool: Able to stand independently and safely and complete 8 steps in 20 seconds Standing Unsupported, One Foot in Front: Able to plae foot ahead of the other independently and hold 30 seconds Standing on One Leg: Able to lift leg independently and hold 5-10 seconds Total Score: 54 Static Sitting Balance Static Sitting - Balance Support: Feet supported Static Sitting - Level of Assistance: 7: Independent Dynamic Sitting Balance Dynamic Sitting - Balance Support: Feet supported Dynamic Sitting - Level of Assistance: 7: Independent Static Standing Balance Static Standing - Balance Support: During functional activity;No upper extremity supported Static Standing - Level of Assistance: 7: Independent Dynamic Standing Balance Dynamic Standing - Balance Support: During functional activity Dynamic Standing - Level of Assistance: 6: Modified independent (Device/Increase time) Extremity/Trunk Assessment RUE Assessment RUE Assessment: Within Functional Limits General Strength Comments: Very slight gross motor weakness and deficits in higher level handwriting- overall WFL LUE Assessment LUE Assessment: Within Functional Limits   Curtis Sites 06/19/2020, 12:31 PM

## 2020-06-19 NOTE — Progress Notes (Signed)
Occupational Therapy Session Note  Patient Details  Name: Gail Montgomery MRN: [id number] Date of Birth: [birth year]  Today's Date: 06/19/2020 OT Individual Time: 4461-9012 OT Individual Time Calculation (min): 30 min    Short Term Goals: Week 2:  OT Short Term Goal 2 (Week 2): STG=LTG d/t ELOS  Skilled Therapeutic Interventions/Progress Updates:    Pt received sitting EOB with no c/o pain. Pt agreeable to OT session. Discussed knee pain that flared up yesterday and provided edu re CVA recovery and pain. Pt completed 150 ft of functional mobility with (S). Pt completed 9 hole peg test with L: 29.4 and R: 34.7 seconds. Pt completed 3x trials of 25 ft narrow BOS walking to challenge dynamic standing balance, CGA provided for safety as pt had several minor LOB's that she was able to self correct. Pt returned to her room and was left sitting in recliner with all needs met, mother present.   Therapy Documentation Precautions:  Precautions Precautions: Fall Precaution Comments: Impulsive; Right sided weakness (LE>UE) Restrictions Weight Bearing Restrictions: No   Therapy/Group: Individual Therapy  Curtis Sites 06/19/2020, 7:52 AM

## 2020-06-19 NOTE — Patient Care Conference (Signed)
Inpatient RehabilitationTeam Conference and Plan of Care Update Date: 06/19/2020   Time: 10:22 AM    Patient Name: Gail Montgomery      Medical Record Number: [id number]  Date of Birth: Dec 07, 1995 Sex: Female         Room/Bed: 4M11C/4M11C-01 Payor Info: Payor: BLUE CROSS BLUE SHIELD / Plan: [facility] PPO / Product Type: *No Product type* /    Admit Date/Time:  06/07/2020  5:41 PM  Primary Diagnosis:  Acute ischemic left MCA stroke [facility])  Hospital Problems: Principal Problem:   Acute ischemic left MCA stroke (HCC) Active Problems:   Hypokalemia   Acute blood loss anemia   Sinus tachycardia   SIADH (syndrome of inappropriate ADH production) (HCC)   Acute pulmonary embolism without acute cor pulmonale (HCC)   Hyponatremia    Expected Discharge Date: Expected Discharge Date: 06/20/20  Team Members Present: Physician leading conference: Dr. Faith Rogue Care Coodinator Present: Cecile Sheerer, LCSWA;Iyad Deroo Marlyne Beards, RN, BSN, CRRN Nurse Present: Other (comment) Tinnie Gens, RN) PT Present: Peter Congo, PT OT Present: Jake Shark, OT SLP Present: Feliberto Gottron, SLP PPS Coordinator present : Fae Pippin, SLP     Current Status/Progress Goal Weekly Team Focus  Bowel/Bladder   pt cont of b and b, mother assisting to bathroom. lBM per mother 06/18/20  remain cint of b and b  assess q shift and prn   Swallow/Nutrition/ Hydration   Regular textures with thin liquids, Mod I  Mod I  Family Education   ADL's   (S)- mod I all ADLs. Great improvement in RUE [facility], dynamic balance, and processing/cognition  Supervision overall  family training/education, d/c planning, ADLs and transfers, R UE NMR   Mobility             Communication   Mod I  Mod I  Family Education   Tax adviser Observations  Supervision  Supervision  Family Education   Pain   no current c/o pain  Remaon pain free  Assess q shift and prn, medicate prn   Skin   no current skin  issues  remain free of breakdown and infection  Assess q shift and prn     Discharge Planning:  Pt to d/c to her mother's home who will provide 24/7 care to pt.   Team Discussion: No complaints of pain, continent of B/B. OT/PT at goal level. SLP supervision/mod I. Patient on target to meet rehab goals: yes  *See Care Plan and progress notes for long and short-term goals.   Revisions to Treatment Plan:  None at this time.  Teaching Needs: Family education complete.  Current Barriers to Discharge: None at this time, patient ready for discharge.  Possible Resolutions to Barriers: N/A     Medical Summary Current Status: improved RUE and RLE motor function/balance. no pain. HR still 90-100 but asymptomatic. sodium normalizing  Barriers to Discharge: Medical stability   Possible Resolutions to [employer] Focus: finalize medication regimen for home. lab/VS assessment   Continued Need for Acute Rehabilitation Level of Care: The patient requires daily medical management by a physician with specialized training in physical medicine and rehabilitation for the following reasons: Direction of a multidisciplinary physical rehabilitation program to maximize functional independence : Yes Medical management of patient stability for increased activity during participation in an intensive rehabilitation regime.: Yes Analysis of laboratory values and/or radiology reports with any subsequent need for medication adjustment and/or medical intervention. : Yes   I attest that I was present, lead the team  conference, and concur with the assessment and plan of the team.   Tennis Must 06/19/2020, 2:13 PM

## 2020-06-19 NOTE — Plan of Care (Signed)
°  Problem: Consults Goal: RH STROKE PATIENT EDUCATION Description: See Patient Education module for education specifics  Outcome: Progressing   Problem: RH BLADDER ELIMINATION Goal: RH STG MANAGE BLADDER WITH ASSISTANCE Description: STG Manage Bladder With  min Assistance Outcome: Progressing   Problem: RH SKIN INTEGRITY Goal: RH STG SKIN FREE OF INFECTION/BREAKDOWN Description: Manage with cues and reminders Outcome: Progressing   Problem: RH SAFETY Goal: RH STG ADHERE TO SAFETY PRECAUTIONS W/ASSISTANCE/DEVICE Description: STG Adhere to Safety Precautions With  cues/reminders Assistance/Device. Outcome: Progressing   Problem: RH PAIN MANAGEMENT Goal: RH STG PAIN MANAGED AT OR BELOW PT'S PAIN GOAL Description: At or below level 4 Outcome: Progressing   Problem: RH KNOWLEDGE DEFICIT Goal: RH STG INCREASE KNOWLEDGE OF STROKE PROPHYLAXIS Description: Patient will be able to manage stroke prophlyaxis using handouts and educational materials with cues and reminders.  Outcome: Progressing   

## 2020-06-20 MED ORDER — B COMPLEX-C PO TABS: 1.0000 | ORAL_TABLET | Freq: Every day | ORAL | 0 refills | Status: DC

## 2020-06-20 MED ORDER — ACETAMINOPHEN 325 MG PO TABS: 325.0000 mg | ORAL_TABLET | ORAL | Status: DC | PRN

## 2020-06-20 MED ORDER — FOLIC ACID 1 MG PO TABS: 1.0000 mg | ORAL_TABLET | Freq: Every day | ORAL | 0 refills | Status: DC

## 2020-06-20 MED ORDER — ATORVASTATIN CALCIUM 20 MG PO TABS: 20.0000 mg | ORAL_TABLET | Freq: Every day | ORAL | 0 refills | Status: DC

## 2020-06-20 MED ORDER — APIXABAN 5 MG PO TABS: 5.0000 mg | ORAL_TABLET | Freq: Two times a day (BID) | ORAL | 0 refills | Status: DC

## 2020-06-20 MED ORDER — PANTOPRAZOLE SODIUM 40 MG PO TBEC: 40.0000 mg | DELAYED_RELEASE_TABLET | Freq: Every day | ORAL | 0 refills | Status: DC

## 2020-06-20 MED ORDER — POTASSIUM CHLORIDE 20 MEQ PO PACK: 20.0000 meq | PACK | Freq: Every day | ORAL | 0 refills | Status: DC

## 2020-06-20 NOTE — Plan of Care (Signed)
  Problem: Consults Goal: RH STROKE PATIENT EDUCATION Description: See Patient Education module for education specifics  Outcome: Completed/Met   Problem: RH BLADDER ELIMINATION Goal: RH STG MANAGE BLADDER WITH ASSISTANCE Description: STG Manage Bladder With  min Assistance Outcome: Completed/Met   Problem: RH SKIN INTEGRITY Goal: RH STG SKIN FREE OF INFECTION/BREAKDOWN Description: Manage with cues and reminders Outcome: Completed/Met   Problem: RH SAFETY Goal: RH STG ADHERE TO SAFETY PRECAUTIONS W/ASSISTANCE/DEVICE Description: STG Adhere to Safety Precautions With  cues/reminders Assistance/Device. Outcome: Completed/Met   Problem: RH PAIN MANAGEMENT Goal: RH STG PAIN MANAGED AT OR BELOW PT'S PAIN GOAL Description: At or below level 4 Outcome: Completed/Met   Problem: RH KNOWLEDGE DEFICIT Goal: RH STG INCREASE KNOWLEDGE OF STROKE PROPHYLAXIS Description: Patient will be able to manage stroke prophlyaxis using handouts and educational materials with cues and reminders.  Outcome: Completed/Met

## 2020-06-20 NOTE — Progress Notes (Signed)
Inpatient Rehabilitation Care Coordinator  Discharge Note  The overall goal for the admission was met for:   Discharge location: Yes. D/c to her mother's home in [place]. Mother to provide 24/7 care.   Length of Stay: Yes. 12 days.   Discharge activity level: Yes. Supervision.   Home/community participation: Yes. Limited.   Services provided included: MD, RD, PT, OT, SLP, RN, CM, TR, Pharmacy, Neuropsych and SW  Financial Services: Private Insurance: [employer]  Follow-up services arranged: Outpatient: [facility] for PT/OT/SLP  Comments (or additional information): contact pt mother Lelon Mast [phone]  Patient/Family verbalized understanding of follow-up arrangements: Yes  Individual responsible for coordination of the follow-up plan: Pt to have assistance with coordinating care needs.   Confirmed correct DME delivered: Rana Snare 06/20/2020    Rana Snare

## 2020-06-20 NOTE — Progress Notes (Signed)
Pt assisted to gather belongings by staff. PA covered pt discharge information. Pt and mom assisted out to vehicle by staff.

## 2020-06-20 NOTE — Discharge Summary (Signed)
Physician Discharge Summary  Patient ID: Gail Montgomery MRN: [id number] DOB/AGE: 01/09/1996 24 y.o.  Admit date: 06/07/2020 Discharge date: 06/20/2020  Discharge Diagnoses:  Principal Problem:   Acute ischemic left MCA stroke [facility]) Active Problems:   Acute blood loss anemia   Sinus tachycardia   Acute pulmonary embolism without acute cor pulmonale (HCC)   DVT of lower limb, acute (HCC)   Discharged Condition: stable   Significant Diagnostic Studies: N/A   Labs:  Basic Metabolic Panel: BMP Latest Ref Rng & Units 06/15/2020 06/13/2020 06/12/2020  Glucose 70 - 99 mg/dL 96 397(Q) 93  BUN 6 - 20 mg/dL <7(H) <4(L) 5(L)  Creatinine 0.44 - 1.00 mg/dL 9.37 9.02 4.09  Sodium 135 - 145 mmol/L 138 139 136  Potassium 3.5 - 5.1 mmol/L 3.8 3.5 4.3  Chloride 98 - 111 mmol/L 103 104 104  CO2 22 - 32 mmol/L 24 27 24   Calcium 8.9 - 10.3 mg/dL 9.1 9.5 8.9    CBC: CBC Latest Ref Rng & Units 06/15/2020 06/08/2020 06/06/2020  WBC 4.0 - 10.5 K/uL 2.9(L) 5.1 6.2  Hemoglobin 12.0 - 15.0 g/dL 08/06/2020) 10.6(L) 9.3(L)  Hematocrit 36 - 46 % 32.6(L) 33.9(L) 29.1(L)  Platelets 150 - 400 K/uL 328 450(H) 503(H)    CBG: No results for input(s): GLUCAP in the last 168 hours.  Brief HPI:   Gail Montgomery is a 24 y.o. female with history of anxiety, recreational marijuana use, OCP use who was admitted on 05/19/2020 with sudden onset of right-sided weakness and difficulty speaking. She was fully vaccinated a month prior to admission but was Covid positive. CT head done showing dense left-MCA and CTA head/neck showed emergent LVO at left ICA terminus continuing into left-MCA. She underwent cerebral angio with thrombectomy of left-MCA. Postprocedure CT showed large SAH with ventricular extension and she received TXA and cryoprecipitate to stop bleeding. MRI brain showed early subacute infarct in left basal ganglia and SAH along interhemispheric fissure to the lateral ventricles with early communicating hydrocephalus. Dr.  05/21/2020 was consulted for input and felt that no surgical intervention needed and she was started on hypertonic saline for edema.   Repeat CT head showed progression of hydrocephalus which was treated conservatively with nimodipine to keep SBP less than 160. BLE Dopplers were negative for DVT. 2D echo showed EF of 60 to 65% and TCD showed evidence of PFO. Dr. Franky Macho felt the stroke was embolic question ICA dissection versus paradoxical emboli from PFO versus Covid hypercoagulopathy. Hospital course significant for fevers and tachycardia which we have felt to be Covid related. Repeat Doppler showed acute DVT in right femoral, popliteal and gastrocnemius veins. CTA chest showed bilateral PE and she was started on heparin and transition to Eliquis on 09/03. E. coli UTI treated with course of ceftriaxone. 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 Plans are for PFO closure in the future. Patient with resultant aphasia, right-sided weakness and apraxia affecting ADL tasks as well as mobility. CIR was recommended due to functional decline.   Hospital Course: Gail Montgomery was admitted to rehab 06/07/2020 for inpatient therapies to consist of PT, ST and OT at least three hours five days a week. Past admission physiatrist, therapy team and rehab RN have worked together to provide customized collaborative inpatient rehab. Follow-up labs showed SIADH had resolved and fluid restrictions were discontinued. Protein supplement was added due to low protein stores. She is tolerating Eliquis without any signs or symptoms  of bleeding. Serial CBC showed H&H to be relatively stable in 9-10 range. Recommend follow-up CBC in 1 to 2 weeks to monitor for stability past discharge.  Blood pressures were monitored on TID basis and have been reasonably controlled. No cardiac symptoms noted with increase in activity. Her p.o. intake has been good and she is  continent of bowel and bladder. Mood has been stable and she is showing good participation with therapy. Neuropsychologist has followed up for evaluation and support. He has worked with patient on coping and adjustment issues. Her verbal output has improved and she is able to express wants and needs independently. She has progressed to supervision level continue to receive follow-up outpatient PT, OT and ST at [facility] outpatient therapy after discharge.   Rehab course: During patient's stay in rehab weekly team conferences were held to monitor patient's progress, set goals and discuss barriers to discharge. At admission, patient required mod assist with mobility and min assist with ADL tasks.  She exhibited mild high-level word finding deficits and mild to moderate deficits in recall and problem solving.  She  has had improvement in activity tolerance, balance, postural control as well as ability to compensate for deficits. She has had improvement in functional use RUE  and RLE as well as improvement in awareness. She was able to complete ADL tasks with supervision. She was ambulating up to 400 feet with supervision and tendency of right toe drag at times. She was modified independent for verbal expression and comprehension. She requires supervision to complete functional and mildly complex tasks and for recall. Family education was completed with mother regarding all aspects of safety and care.    Disposition: Home  Diet: Low-fat low-cholesterol  Special Instructions: 1. No driving till cleared by MD. 2. Needs supervision with activity. 3. Repeat CBC in 1 to 2 weeks to monitor H&H.  Discharge Instructions    Ambulatory referral to Physical Medicine Rehab   Complete by: As directed    1-2 weeks TC appt     Allergies as of 06/20/2020      Reactions   Other Other (See Comments)   Unknown med that was given for an outbreak of Mono at school caused hallucinations and sleep walking with the  patient      Medication List    STOP taking these medications   CAL-MAG-ZINC PO   docusate 50 MG/5ML liquid Commonly known as: COLACE   polyethylene glycol 17 g packet Commonly known as: MIRALAX / GLYCOLAX   thiamine 100 MG tablet   vitamin B-12 500 MCG tablet Commonly known as: CYANOCOBALAMIN     TAKE these medications   acetaminophen 325 MG tablet Commonly known as: TYLENOL Take 1-2 tablets (325-650 mg total) by mouth every 4 (four) hours as needed for mild pain or headache.   alum & mag hydroxide-simeth 200-200-20 MG/5ML suspension Commonly known as: MAALOX/MYLANTA Take 30 mLs by mouth every 4 (four) hours as needed for indigestion.   apixaban 5 MG Tabs tablet Commonly known as: ELIQUIS Take 1 tablet (5 mg total) by mouth 2 (two) times daily. Notes to patient: Blood thinner to treat blood clots   atorvastatin 20 MG tablet Commonly known as: LIPITOR Take 1 tablet (20 mg total) by mouth daily. Notes to patient: For plaque stabilization/cholesterol medication. Start tomorrow.   B-complex with vitamin C tablet Take 1 tablet by mouth daily.   folic acid 1 MG tablet Commonly known as: FOLVITE Take 1 tablet (1 mg total) by mouth  daily.   multivitamin Tabs tablet Take 1 tablet by mouth daily.   pantoprazole 40 MG tablet Commonly known as: PROTONIX Take 1 tablet (40 mg total) by mouth daily. Notes to patient: For reflux/indigestion--can change to as needed if doing well.    potassium chloride 20 MEQ packet Commonly known as: KLOR-CON Take 20 mEq by mouth daily. Notes to patient: Supplement due to low levels.        Follow-up Information    Ranelle Oyster, MD Follow up.   Specialty: Physical Medicine and Rehabilitation Why: Office will call you with follow up appointment Contact information: [address] [place] [address] [phone]        [facility]. Call.   Why: for stroke follow up Contact  information: [address]     [address] [place] [address] [phone]       Tonny Bollman, MD Follow up on 07/11/2020.   Specialty: Cardiology Why: Appointment at 3:40 pm. Contact information: [address] [place] [address] [phone]        Shirlean Mylar, MD. Call.   Specialty: Family Medicine Why: for post hospital follow up Contact information: [address] [place] [address] [phone]               Signed: Jacquelynn Cree 06/21/2020, 4:42 PM

## 2020-06-20 NOTE — Progress Notes (Signed)
[place] PHYSICAL MEDICINE & REHABILITATION PROGRESS NOTE   Subjective/Complaints: No complaints this morning DC today  Will be getting outpatient therapy Pain is well controlled.   ROS: Patient denies fever, rash, sore throat, blurred vision, nausea, vomiting, diarrhea, cough, shortness of breath or chest pain, joint or back pain, headache, or mood change.   Objective:   No results found. No results for input(s): WBC, HGB, HCT, PLT in the last 72 hours. No results for input(s): NA, K, CL, CO2, GLUCOSE, BUN, CREATININE, CALCIUM in the last 72 hours.  Intake/Output Summary (Last 24 hours) at 06/20/2020 0915 Last data filed at 06/19/2020 1840 Gross per 24 hour  Intake 480 ml  Output --  Net 480 ml     Physical Exam: Vital Signs Blood pressure 101/71, pulse (!) 103, temperature 98.5 F (36.9 C), resp. rate 18, height 5\' 4"  (1.626 m), weight 77.9 kg, SpO2 100 %. General: Alert and oriented x 3, No apparent distress HEENT: Head is normocephalic, atraumatic, PERRLA, EOMI, sclera anicteric, oral mucosa pink and moist, dentition intact, ext ear canals clear,  Neck: Supple without JVD or lymphadenopathy Heart: Reg rate and rhythm. No murmurs rubs or gallops Chest: CTA bilaterally without wheezes, rales, or rhonchi; no distress Abdomen: Soft, non-tender, non-distended, bowel sounds positive. Extremities: No clubbing, cyanosis, or edema. Pulses are 2+ Skin: Clean and intact without signs of breakdown Musc: Lower extremity resolved.  No tenderness. Neuro:Alert and oriented x 3. Normal insight and awareness. Intact Memory. Normal language and speech. Cranial nerve exam unremarkable. Still some apraxia but better  Motor: RUE- proximally 4/5 in biceps, triceps and 4/5 in WE/grip and finger abd, decreased FMC.  RLE- HF 4 /5, KE/KF  4/5, DF 4/5. Drags the leg slightly during swing. Senses pain and LT in all 4's.    Assessment/Plan: 1. Functional deficits secondary to acute left ischemic  stroke, which require 3+ hours per day of interdisciplinary therapy in a comprehensive inpatient rehab setting.  Physiatrist is providing close team supervision and 24 hour management of active medical problems listed below.  Physiatrist and rehab team continue to assess barriers to discharge/monitor patient progress toward functional and medical goals  Care Tool:  Bathing    Body parts bathed by patient: Right arm, Left arm, Chest, Abdomen, Front perineal area, Buttocks, Right upper leg, Left upper leg, Right lower leg, Left lower leg, Face         Bathing assist Assist Level: Supervision/Verbal cueing     Upper Body Dressing/Undressing Upper body dressing   What is the patient wearing?: Pull over shirt, Bra    Upper body assist Assist Level: Supervision/Verbal cueing    Lower Body Dressing/Undressing Lower body dressing      What is the patient wearing?: Underwear/pull up, Pants     Lower body assist Assist for lower body dressing: Supervision/Verbal cueing     Toileting Toileting    Toileting assist Assist for toileting: Supervision/Verbal cueing     Transfers Chair/bed transfer  Transfers assist     Chair/bed transfer assist level: Supervision/Verbal cueing     Locomotion Ambulation   Ambulation assist      Assist level: Supervision/Verbal cueing Assistive device: No Device Max distance: 358ft   Walk 10 feet activity   Assist     Assist level: Supervision/Verbal cueing Assistive device: No Device   Walk 50 feet activity   Assist Walk 50 feet with 2 turns activity did not occur: Safety/medical concerns  Assist level: Supervision/Verbal cueing Assistive device:  No Device    Walk 150 feet activity   Assist Walk 150 feet activity did not occur: Safety/medical concerns  Assist level: Supervision/Verbal cueing Assistive device: No Device    Walk 10 feet on uneven surface  activity   Assist Walk 10 feet on uneven surfaces  activity did not occur: Safety/medical concerns   Assist level: Supervision/Verbal cueing Assistive device: Other (comment) (none)   Wheelchair     Assist Will patient use wheelchair at discharge?: No Type of Wheelchair: Manual    Wheelchair assist level: Supervision/Verbal cueing Max wheelchair distance: 75 ft    Wheelchair 50 feet with 2 turns activity    Assist        Assist Level: Supervision/Verbal cueing   Wheelchair 150 feet activity     Assist          Blood pressure 101/71, pulse (!) 103, temperature 98.5 F (36.9 C), resp. rate 18, height 5\' 4"  (1.626 m), weight 77.9 kg, SpO2 100 %.  Medical Problem List and Plan: 1.  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 Associated infarct in left BG  PRAFO  qhs  DC home today  -Patient to see Sr. in the office for transitional care encounter in 1-2 weeks. Also needs neuro f/u 2.  B-PE/RLE DVT: Antithrombotics: -anticoagulation:  Pharmaceutical: Other (comment)--Eliquis             -antiplatelet therapy: N/a 3. Pain Management: Tylenol prn. Well controlled 4. Mood: LCSW to follow for evaluation and support.              -antipsychotic agents: N/A 5. Neuropsych: This patient is not fully capable of making decisions on her own behalf. 6. Skin/Wound Care: Routine pressure relief measures.  7. Fluids/Electrolytes/Nutrition: Monitor I/Os.    -eating well  -protein supps  8. Fevers: Inflammatory markers improving.   Felt to be due to clot burden/central.     resolved    9. SIADH/Hyponatremia: Resolved . Will continue to monitor with daily checks.   Sodium 138 9/17  -off FR 10. HTN: Monitor BP tid--SBP goal <160.  Vitals:   06/19/20 2020 06/20/20 0547  BP: 113/78 101/71  Pulse: (!) 106 (!) 103  Resp: 20 18  Temp: 98.3 F (36.8 C) 98.5 F (36.9 C)  SpO2: 100% 100%    11. Resting tachycardia:  Multifactorial deconditioning, post covid , mild anemia, check stool OB, has not had MP since admit, check orthostatic vitals   Improving, holding in 90's to 100. Remains asymptomatic 12. Anemia/thrombocytosis: Will continue to monitor   Hemoglobin 10.6 on 9/10. Holding at 9.8  9/17  Platelets 328 9/17  Check stool guaic 13. Sick Euthyroid syndrome: Needs follow up thyroid panel in 4-6 weeks.  14. Occ urinary incontinence- appears to have resolved.  15.  Hypokalemia  Potassium 3.8 9/17  Continue supplementation daily     LOS: 13 days A FACE TO FACE EVALUATION WAS PERFORMED  Khamron Gellert P Jamaine Quintin 06/20/2020, 9:15 AM

## 2020-06-20 NOTE — Discharge Instructions (Signed)
Inpatient Rehab Discharge Instructions  Rilla Buckman Discharge date and time:  06/20/20  Activities/Precautions/ Functional Status: Activity: no lifting, driving, or strenuous exercise till cleared by MD Diet: low fat, low cholesterol diet Wound Care: none needed   Functional status:  ___ No restrictions     ___ Walk up steps independently _X__ 24/7 supervision/assistance   ___ Walk up steps with assistance ___ Intermittent supervision/assistance  ___ Bathe/dress independently ___ Walk with walker     ___ Bathe/dress with assistance ___ Walk Independently    ___ Shower independently ___ Walk with assistance    _X__ Shower with assistance _X__ No alcohol      ___ Return to work/school to be decided after follow up appointments    COMMUNITY REFERRALS UPON DISCHARGE:     Outpatient: PT     OT    ST             Agency:[facility]/Outpatient Therapy  Phone: [phone]                                     Appointment Date/Time:*Please expect follow-up within 7-10 business days to schedule your appointment. If you have not received follow-up be sure to contact the site directly.*   Special Instructions: 1. Needs to set appointment with primary care provider--needs follow up thyroid studies in 4-5 weeks as well as roune labs/post hospital follow up.  2. NO driving or strenuous activity.    STROKE/TIA DISCHARGE INSTRUCTIONS SMOKING Cigarette smoking nearly doubles your risk of having a stroke & is the single most alterable risk factor  If you smoke or have smoked in the last 12 months, you are advised to quit smoking for your health.  Most of the excess cardiovascular risk related to smoking disappears within a year of stopping.  Ask you doctor about anti-smoking medications  [place] Quit Line: 1-800-QUIT NOW  Free Smoking Cessation Classes [phone]  CHOLESTEROL Know your levels; limit fat & cholesterol in your diet  Lipid Panel     Component Value Date/Time   CHOL 156  05/20/2020 0958   TRIG 105 05/20/2020 0958   TRIG 104 05/20/2020 0958   HDL 57 05/20/2020 0958   CHOLHDL 2.7 05/20/2020 0958   VLDL 21 05/20/2020 0958   LDLCALC 78 05/20/2020 0958      Many patients benefit from treatment even if their cholesterol is at goal.  Goal: Total Cholesterol (CHOL) less than 160  Goal:  Triglycerides (TRIG) less than 150  Goal:  HDL greater than 40  Goal:  LDL (LDLCALC) less than 100   BLOOD PRESSURE American Stroke Association blood pressure target is less that 120/80 mm/Hg  Your discharge blood pressure is:  BP: 113/78  Monitor your blood pressure  Limit your salt and alcohol intake  Many individuals will require more than one medication for high blood pressure  DIABETES (A1c is a blood sugar average for last 3 months) Goal HGBA1c is under 7% (HBGA1c is blood sugar average for last 3 months)  Diabetes: No known diagnosis of diabetes    Lab Results  Component Value Date   HGBA1C 5.6 05/20/2020     Your HGBA1c can be lowered with medications, healthy diet, and exercise.  Check your blood sugar as directed by your physician  Call your physician if you experience unexplained or low blood sugars.  PHYSICAL ACTIVITY/REHABILITATION Goal is 30 minutes at least 4 days per  week  Activity: No driving, Therapies: see above Return to work: N/A  Activity decreases your risk of heart attack and stroke and makes your heart stronger.  It helps control your weight and blood pressure; helps you relax and can improve your mood.  Participate in a regular exercise program.  Talk with your doctor about the best form of exercise for you (dancing, walking, swimming, cycling).  DIET/WEIGHT Goal is to maintain a healthy weight  Your discharge diet is:  Diet Order            Diet regular Room service appropriate? Yes; Fluid consistency: Thin  Diet effective now               liquids Your height is:  Height: 5\' 4"  (162.6 cm) Your current weight is: Weight:  171 lbs Your Body Mass Index (BMI) is:  BMI (Calculated): 29.46  Following the type of diet specifically designed for you will help prevent another stroke.  Your goal weight is 145 lbs  Your goal Body Mass Index (BMI) is 19-24.  Healthy food habits can help reduce 3 risk factors for stroke:  High cholesterol, hypertension, and excess weight.  RESOURCES Stroke/Support Group:  Call [phone]   STROKE EDUCATION PROVIDED/REVIEWED AND GIVEN TO PATIENT Stroke warning signs and symptoms How to activate emergency medical system (call 911). Medications prescribed at discharge. Need for follow-up after discharge. Personal risk factors for stroke. Pneumonia vaccine given:  Flu vaccine given:  My questions have been answered, the writing is legible, and I understand these instructions.  I will adhere to these goals & educational materials that have been provided to me after my discharge from the hospital.     My questions have been answered and I understand these instructions. I will adhere to these goals and the provided educational materials after my discharge from the hospital.  Patient/Caregiver Signature _______________________________ Date __________  Clinician Signature _______________________________________ Date __________  Please bring this form and your medication list with you to all your follow-up doctor's appointments.

## 2020-06-20 NOTE — Progress Notes (Signed)
[facility] Discharge Summary Patient Details  Name: Gail Montgomery MRN: [id number] Date of Birth: 1996-06-16 Today's Date: 06/20/2020  Long term goals set: 1  Long term goals met: 1  Comments on progress toward goals: Pt has made excellent progress during LOS and is ready for discharge home with family to provide/coordinate supervision/assistance.  TR sessions focused on pt education, coping strategies, energy conservation, activity analysis identifying potential modifications, and community reintegration.  Pt met supervision level for community reintegration ambulatory level.  Goal met.  Reasons goals not met: n/a  Equipment acquired: n/a  Reasons for discharge: discharge from hospital  Follow-up: Outpatient  Patient/family agrees with progress made and goals achieved: Yes  Taraya Steward 06/20/2020, 10:28 AM

## 2020-06-21 DIAGNOSIS — I82409 Acute embolism and thrombosis of unspecified deep veins of unspecified lower extremity: Secondary | ICD-10-CM

## 2020-06-22 ENCOUNTER — Telehealth: Payer: Self-pay | Admitting: Registered Nurse

## 2020-06-22 NOTE — Telephone Encounter (Signed)
Transitional Care call Transitional Questions Answered by [name] ( Mother)   Patient name: Gail Montgomery DOB: 1995-11-30 1. Are you/is patient experiencing any problems since coming home? No a. Are there any questions regarding any aspect of care? No 2. Are there any questions regarding medications administration/dosing? No a. Are meds being taken as prescribed? Yes b. "Patient should review meds with caller to confirm" Medication List Reviewed  3. Have there been any falls? No 4. Has Home Health been to the house and/or have they contacted you? She is going to outpatient Therapy at [facility] a. If not, have you tried to contact them? NA b. Can we help you contact them? NA 5. Are bowels and bladder emptying properly? Yes a. Are there any unexpected incontinence issues? Yes, occassionally at Night b. If applicable, is patient following bowel/bladder programs? NA 6. Any fevers, problems with breathing, unexpected pain? No 7. Are there any skin problems or new areas of breakdown? No 8. Has the patient/family member arranged specialty MD follow up (ie cardiology/neurology/renal/surgical/etc.)?  [name] was instructed to call [facility] to schedule HFU appointment, she verbalizes understanding. She has an appointment with Dr Excell Seltzer and Dr Hyman Hopes already she reports.  9. Can we help arrange? No. Does the patient need any other services or support that we can help arrange? No 10. Are caregivers following through as expected in assisting the patient? Yes 11. Has the patient quit smoking, drinking alcohol, or using drugs as recommended? (                        )  Appointment date/time 06/27/2020, For My-Chart Video Appointment  A:45 for 2:00 appointment.At [address] [name] [address]

## 2020-06-26 ENCOUNTER — Telehealth: Payer: Self-pay

## 2020-06-26 NOTE — Telephone Encounter (Signed)
April, she's going to receive a my chart version of letter. Could you figure out a way to email this letter to [employer]?  [email or web address]   thanks

## 2020-06-26 NOTE — Telephone Encounter (Signed)
Gail Montgomery is needing a letter today. Stating she is the primary care giver for [facility]. The letter is for her work place.  Call back ph [phone] for more details.   She was advised if granted, it may not be ready today. However I would ask [name] NP.

## 2020-06-27 ENCOUNTER — Encounter: Payer: Self-pay | Admitting: Registered Nurse

## 2020-06-27 ENCOUNTER — Other Ambulatory Visit: Payer: Self-pay

## 2020-06-27 ENCOUNTER — Encounter: Payer: BC Managed Care – PPO | Attending: Registered Nurse | Admitting: Registered Nurse

## 2020-06-27 ENCOUNTER — Telehealth: Payer: BC Managed Care – PPO | Admitting: Registered Nurse

## 2020-06-27 VITALS — BP 114/69 | HR 99 | Temp 98.6°F | Ht 63.0 in | Wt 165.0 lb

## 2020-06-27 DIAGNOSIS — I63512 Cerebral infarction due to unspecified occlusion or stenosis of left middle cerebral artery: Secondary | ICD-10-CM

## 2020-06-27 DIAGNOSIS — I2692 Saddle embolus of pulmonary artery without acute cor pulmonale: Secondary | ICD-10-CM

## 2020-06-27 NOTE — Progress Notes (Signed)
Subjective:    Patient ID: Gail Montgomery, female    DOB: 06/08/1996, 24 y.o.   MRN: [id number]  HPI: Gail Montgomery is a 24 y.o. female who is receiving a My-Chart Video visit for transitional care in follow up of her Acute Ischemic left MCA Stoke, Acute Pulmonary Embolism without acute cor pulmonale and Acute DVT of lower limb. She presented to [facility] ED on 05/19/2020 as a Code Stroke, she had sudden onset of right hemiparesis and aphasia. She received TPA developed SAH subsequently per Dr Jayme Cloud Note on 05/19/2020.[facility] was consulted.   Gail Montgomery was COVID Positive, but was asymptomatic per Dr Marcos Eke Note from 05/19/2020.    CT Head Code Stroke WO Contrast:  IMPRESSION: Dense left ICA terminus and MCA. No acute hemorrhage. ASPECTS is 10.  CT Code Stroke CTA Head W/WO Contrast:  IMPRESSION: 1. 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 2. Mildly indistinct appearance of the proximal left ICA is likely from motion artifact, consider cervical run. No atheromatous changes or vasculopathy seen in the neck.  She underwent Cerebral Angio Thrombectomy,  Of her left MCA.   CT Head WO Contrast on 05/20/2020:  IMPRESSION: 1. Subarachnoid hemorrhage along the anterior interhemispheric fissure with slightly increased extension into both lateral ventricles. 2. Slight increase in size of the temporal horn of the left lateral ventricle consistent with early communicating hydrocephalus.  MRI Brain: WO Contrast:  IMPRESSION: 1. Early subacute infarct of the left basal ganglia involving the posterior putamen and caudate body. 2. Subarachnoid hemorrhage along the interhemispheric fissure extending into the lateral ventricles. 3. 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  CT Angio Chest: PE W or WO Contrast:  IMPRESSION: 1. Bilateral segmental LOWER lobe pulmonary  emboli, LEFT-greater-than-RIGHT. Equivocal segmental emboli within the LEFT UPPER lobe. 2. Mild cardiomegaly.  ECHO TEE:  1. Left ventricular ejection fraction, by estimation, is 60 to 65%. The left ventricle has normal function. 2. Right ventricular systolic function is normal. The right ventricular size is normal. 3. No left atrial/left atrial appendage thrombus was detected. 4. The mitral valve is grossly normal. Trivial mitral valve regurgitation. 5. The aortic valve is tricuspid. Aortic valve regurgitation is not visualized. 6. Evidence of atrial level shunting detected by color flow Doppler. Agitated saline contrast bubble study was positive with shunting observed within 3-6 cardiac cycles suggestive of interatrial shunt. There is a moderately sized patent foramen ovale with bidirectional shunting across atrial septum. Conclusion(s)/Recommendation(s): Findings are concerning for an interatrial shunt as detailed above. Moderate sized bidirectional PFO. Given young age and recent stroke, would recommend structural heart team evaluation for PFO closure. Left Ventricle: Left ventricular ejection fraction, by estimation, is 60 to 65%. The left ventricle has normal function. The left ventricular internal cavity size was normal in size. There is no left ventricular hypertrophy. Right Ventricle: The right ventricular size is normal. No increase in right ventricular wall thickness. Right  Venous Doppler:  Summary: RIGHT: - Findings consistent with acute deep vein thrombosis involving the right femoral vein, right popliteal vein, and Lower Venous DVTStudy Comparison Study: 05/21/2020- negative bilateral lower extremity venous duplex Performing Technologist: Gertie Fey MHA, RDMS, RVT, RDCS Examination Guidelines: A complete evaluation includes B-mode imaging, spectral Doppler, color Doppler, and power Doppler as needed of all accessible portions of each vessel. Bilateral testing is  considered an integral part of a complete examination. Limited examinations for reoccurring indications may be performed  as noted. The reflux portion of the exam is performed with the patient in reverse Trendelenburg. RIGHT Compressibility Phasicity Spontaneity Properties Thrombus Aging CFV Full Yes Yes SFJ Full FV Prox None No Acute FV Mid None Acute FV Distal None Acute PFV Full POP None No Acute PTV Full Yes PERO Partial Yes Gastroc None No Acute LEFT Compressibility Phasicity Spontaneity Properties Thrombus Aging CFV Full Yes Yes SFJ Full FV Prox Full FV Mid Full FV Distal Full PFV Full POP Full Yes Yes PTV Full PERO Full Final [name] [id number] 05/28/2020 right gastrocnemius veins. - No cystic structure found in the popliteal fossa. LEFT: - There is no evidence of deep vein thrombosis in the lower extremity. - No cystic structure found in the popliteal fossa.  Gail Montgomery was started on heparin and transitioned to Eliquis.   Gail Montgomery was admitted to Inpatient Rehabilitation on 06/07/2020 and discharged home on 06/20/2020. She is going to Outpatient Therapy at [facility], Also reports she has a good appetite.  She denies any pain. She rates her pain 0.    Pain Inventory Average Pain 7 Pain Right Now 0 My pain is intermittent and aching  In the last 24 hours, has pain interfered with the following? General activity 7 Relation with others 0 Enjoyment of life 0 What TIME of day is your pain at its worst? varies Sleep (in general) Good  Pain is worse with: some activites and after therapy Pain improves with: rest Relief from Meds: na  how many minutes can you walk? 15 ability to climb steps?  yes do you drive?  no  disabled: date disabled . I need assistance with the following:  meal prep, household duties and shopping  tremor trouble walking  TC pt  TC pt    Family History  Problem Relation Age of Onset  . Healthy Mother   . Healthy  Father   . Cancer Maternal Grandmother        renal  . Heart disease Maternal Grandfather    Social History   Socioeconomic History  . Marital status: Single    Spouse name: Not on file  . Number of children: Not on file  . Years of education: Not on file  . Highest education level: Some college, no degree  Occupational History  . Not on file  Tobacco Use  . Smoking status: Current Some Day Smoker    Types: Cigarettes  . Smokeless tobacco: Never Used  Vaping Use  . Vaping Use: Never used  Substance and Sexual Activity  . Alcohol use: Yes    Comment: OCCASIONAL   . Drug use: Yes    Types: Marijuana  . Sexual activity: Not on file  Other Topics Concern  . Not on file  Social History Narrative  . Not on file   Social Determinants of Health   Financial Resource Strain:   . Difficulty of Paying Living Expenses: Not on file  Food Insecurity:   . Worried About Programme researcher, broadcasting/film/video in the Last Year: Not on file  . Ran Out of Food in the Last Year: Not on file  Transportation Needs:   . Lack of Transportation (Medical): Not on file  . Lack of Transportation (Non-Medical): Not on file  Physical Activity:   . Days of Exercise per Week: Not on file  . Minutes of Exercise per Session: Not on file  Stress:   . Feeling of Stress : Not on file  Social Connections:   .  Frequency of Communication with Friends and Family: Not on file  . Frequency of Social Gatherings with Friends and Family: Not on file  . Attends Religious Services: Not on file  . Active Member of Clubs or Organizations: Not on file  . Attends Banker Meetings: Not on file  . Marital Status: Not on file   Past Surgical History:  Procedure Laterality Date  . BUBBLE STUDY  06/06/2020   Procedure: BUBBLE STUDY;  Surgeon: Chrystie Nose, MD;  Location: [facility] ENDOSCOPY;  Service: Cardiovascular;;  . IR CT HEAD LTD  05/19/2020  . IR CT HEAD LTD  05/19/2020  . IR PERCUTANEOUS ART THROMBECTOMY/INFUSION  INTRACRANIAL INC DIAG ANGIO  05/19/2020  . IR US GUIDE VASC ACCESS RIGHT  05/19/2020  . RADIOLOGY WITH ANESTHESIA N/A 05/19/2020   Procedure: IR WITH ANESTHESIA;  Surgeon: Radiologist, Medication, MD;  Location: [facility] OR;  Service: Radiology;  Laterality: N/A;  . TEE WITHOUT CARDIOVERSION N/A 06/06/2020   Procedure: TRANSESOPHAGEAL ECHOCARDIOGRAM (TEE);  Surgeon: Chrystie Nose, MD;  Location: [facility] ENDOSCOPY;  Service: Cardiovascular;  Laterality: N/A;   Past Medical History:  Diagnosis Date  . Anxiety   . Panic attack    BP 114/69 Comment: pt reported, virtual visit  Pulse 99 Comment: pt reported, virtual visit  Temp 98.6 F (37 C) Comment: pt reported, virtual visit  Ht 5\' 3"  (1.6 m) Comment: pt reported, virtual visit  Wt 165 lb (74.8 kg) Comment: pt reported, virtual visit  BMI 29.23 kg/m   Opioid Risk Score:   Fall Risk Score:  `1  Depression screen PHQ 2/9  Depression screen PHQ 2/9 06/27/2020  Decreased Interest 1  Down, Depressed, Hopeless 0  PHQ - 2 Score 1  Altered sleeping 1  Tired, decreased energy 0  Change in appetite 0  Feeling bad or failure about yourself  1  Trouble concentrating 0  Moving slowly or fidgety/restless 0  Suicidal thoughts 0  PHQ-9 Score 3  Difficult doing work/chores Not difficult at all     Review of Systems  Constitutional: Positive for fever.  Musculoskeletal: Positive for gait problem.  Neurological: Positive for tremors.  All other systems reviewed and are negative.      Objective:   Physical Exam Vitals and nursing note reviewed.  Musculoskeletal:     Comments: No Physical Exam Perform: My-Chart Video Visit           Assessment & Plan:  1.Acute Ischemic left MCA Stoke: This prover placed a call to [facility] regarding her HFU, they will call Ms. Grabinski today. Continue Outpatient Therapy. Continue to Monitor.  2.  Acute Pulmonary Embolism without acute cor pulmonale: Continue Eliquis. She has a HFU with Cardiology.    3.Acute DVT of lower limb: Continue Eliquis. PCP Following. Continue to Monitor.   My-Chart Video Visit Transitional Care Visit Location of Patient: In her Home Location of Provider: In the Office  F/U with [facility] in 4-6 weeks

## 2020-06-28 ENCOUNTER — Telehealth: Payer: Self-pay | Admitting: *Deleted

## 2020-06-28 ENCOUNTER — Encounter: Payer: Self-pay | Admitting: *Deleted

## 2020-06-28 NOTE — Telephone Encounter (Signed)
Called patient to advise her Dr Pearlean Brownie wants to see her sooner than DEC. No answer, VMB full. I rescheduled her for 07/30/20. Sent her my chart to advise of new appointment date/time.

## 2020-07-06 ENCOUNTER — Telehealth: Payer: Self-pay | Admitting: Registered Nurse

## 2020-07-06 ENCOUNTER — Telehealth: Payer: Self-pay

## 2020-07-06 NOTE — Telephone Encounter (Signed)
Return Gail Montgomery ( mother) call, she reported Gail Montgomery was placed on fluid restrictions. 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  She also stated  [name]  had a virtual visit with Dr Hyman Hopes and was supposed to have blood work ordered, she has called Dr Hyman Hopes office with no success. This provider placed a call to Dr Hyman Hopes office explaining the above, [name] is scheduled to have blood work on 07/09/2020 at 09:30. Placed a call to Ms. Woodin ( mother regarding the above, she verbalizes understanding.

## 2020-07-06 NOTE — Telephone Encounter (Signed)
Patient mother, Darlina Sicilian (ph# [phone]) has requested a call back. She has questions that need to be answered. Regarding [name]'s fluid restrictions and follow up lab orders.   Thank you

## 2020-07-06 NOTE — Telephone Encounter (Signed)
Pt is no longer on fluid restriction

## 2020-07-11 ENCOUNTER — Ambulatory Visit: Payer: BC Managed Care – PPO | Admitting: Cardiovascular Disease

## 2020-07-11 ENCOUNTER — Other Ambulatory Visit: Payer: Self-pay

## 2020-07-11 ENCOUNTER — Encounter: Payer: Self-pay | Admitting: Cardiovascular Disease

## 2020-07-11 VITALS — BP 112/68 | HR 98 | Ht 63.0 in | Wt 170.0 lb

## 2020-07-11 DIAGNOSIS — Q2112 Patent foramen ovale: Secondary | ICD-10-CM

## 2020-07-11 DIAGNOSIS — Q211 Atrial septal defect: Secondary | ICD-10-CM | POA: Diagnosis not present

## 2020-07-11 DIAGNOSIS — I824Z1 Acute embolism and thrombosis of unspecified deep veins of right distal lower extremity: Secondary | ICD-10-CM | POA: Diagnosis not present

## 2020-07-11 NOTE — Patient Instructions (Addendum)
Medication Instructions:  Your provider recommends that you continue on your current medications as directed. Please refer to the Current Medication list given to you today.   *If you need a refill on your cardiac medications before your next appointment, please call your pharmacy*  Testing/Procedures: Your physician has requested that you have a lower extremity venous duplex in 1-2 weeks. This test is an ultrasound of the veins in the leg. It looks at venous blood flow that carries blood from the heart to the legs or arms. Allow one hour for a Lower Venous exam. There are no restrictions or special instructions.  Follow-Up: You are scheduled for a follow-up appointment on 08/29/2020 at 1:30PM with Dr. Earmon Phoenix assistant, Carlean Jews.  Tentative dates for closure: 12/10 12/13 12/20  12/22

## 2020-07-11 NOTE — Progress Notes (Signed)
Cardiology Office Note:    Date:  07/13/2020   ID:  Gail Montgomery, DOB Apr 15, 1996, MRN [id number]  PCP:  Shirlean MylarWebb, Carol, MD  [facility] Cardiologist:  No primary care provider on file.  [facility] Electrophysiologist:  None   Referring MD: No ref. provider found   Chief Complaint  Patient presents with   PFO Consult    History of Present Illness:    Gail Montgomery is a 24 y.o. adult presenting for evaluation of PFO, referred by Dr. Roda ShuttersXu.  The patient was in her normal state of health when she presented with right-sided weakness and expressive aphasia that started May 19, 2020.  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  This occurred in the setting of a PFO, oral contraceptive use, and possibly Covid hypercoagulability.  During her index hospitalization, she underwent transesophageal echo demonstrating a moderate PFO with bidirectional shunting on color Doppler.  Transcranial Doppler study demonstrated Spencer grade 3 shunt at rest. During her hospitalization, she initially had a negative lower extremity venous duplex. However, a follow-up venous duplex demonstrated extensive thrombus in the right femoral veins. She then underwent a chest CTA demonstrating bilateral pulmonary embolus.   The patient is here with her mom today. She is doing really well now. Feels fully recovered from her hospitalization and rehab stay. She was fully vaccinated for Covid, but tested positive at the time of her hospital admission.  The patient denies any cardiopulmonary symptoms.  She specifically denies chest pain, chest pressure, shortness of breath, heart palpitations, edema, orthopnea, or PND.  She has had some right leg swelling ever since her diagnosis of DVT.   Past Medical History:  Diagnosis Date   Anxiety    Panic attack     Past Surgical History:  Procedure Laterality Date   BUBBLE STUDY  06/06/2020     Procedure: BUBBLE STUDY;  Surgeon: Chrystie NoseHilty, Kenneth C, MD;  [facility];  Service: Cardiovascular;;   IR CT HEAD LTD  05/19/2020   IR CT HEAD LTD  05/19/2020   IR PERCUTANEOUS ART THROMBECTOMY/INFUSION INTRACRANIAL INC DIAG ANGIO  05/19/2020   IR US GUIDE VASC ACCESS RIGHT  05/19/2020   RADIOLOGY WITH ANESTHESIA N/A 05/19/2020   Procedure: IR WITH ANESTHESIA;  Surgeon: Radiologist, Medication, MD;  Location: [facility] OR;  Service: Radiology;  Laterality: N/A;   TEE WITHOUT CARDIOVERSION N/A 06/06/2020   Procedure: TRANSESOPHAGEAL ECHOCARDIOGRAM (TEE);  Surgeon: Chrystie NoseHilty, Kenneth C, MD;  [facility];  Service: Cardiovascular;  Laterality: N/A;    Current Medications: Current Meds  Medication Sig   acetaminophen (TYLENOL) 325 MG tablet Take 1-2 tablets (325-650 mg total) by mouth every 4 (four) hours as needed for mild pain or headache.   alum & mag hydroxide-simeth (MAALOX/MYLANTA) 200-200-20 MG/5ML suspension Take 30 mLs by mouth every 4 (four) hours as needed for indigestion.   apixaban (ELIQUIS) 5 MG TABS tablet Take 1 tablet (5 mg total) by mouth 2 (two) times daily.   atorvastatin (LIPITOR) 20 MG tablet Take 1 tablet (20 mg total) by mouth daily.   folic acid (FOLVITE) 1 MG tablet Take 1 tablet (1 mg total) by mouth daily.   Multiple Vitamin (MULTIVITAMIN) tablet Take 1 tablet by mouth daily.   pantoprazole (PROTONIX) 40 MG tablet Take 1 tablet (40 mg total) by mouth daily.   potassium chloride (KLOR-CON) 20 MEQ packet Take 20 mEq by mouth daily.  Allergies:   Other   Social History   Socioeconomic History   Marital status: Single    Spouse name: Not on file   Number of children: Not on file   Years of education: Not on file   Highest education level: Some college, no degree  Occupational History   Not on file  Tobacco Use   Smoking status: Current Some Day Smoker    Types: Cigarettes   Smokeless tobacco: Never Used  Vaping Use   Vaping  Use: Never used  Substance and Sexual Activity   Alcohol use: Yes    Comment: OCCASIONAL    Drug use: Yes    Types: Marijuana   Sexual activity: Not on file  Other Topics Concern   Not on file  Social History Narrative   Not on file   Social Determinants of Health   Financial Resource Strain:    Difficulty of Paying Living Expenses: Not on file  Food Insecurity:    Worried About Running Out of Food in the Last Year: Not on file   Ran Out of Food in the Last Year: Not on file  Transportation Needs:    Lack of Transportation (Medical): Not on file   Lack of Transportation (Non-Medical): Not on file  Physical Activity:    Days of Exercise per Week: Not on file   Minutes of Exercise per Session: Not on file  Stress:    Feeling of Stress : Not on file  Social Connections:    Frequency of Communication with Friends and Family: Not on file   Frequency of Social Gatherings with Friends and Family: Not on file   Attends Religious Services: Not on file   Active Member of Clubs or Organizations: Not on file   Attends Banker Meetings: Not on file   Marital Status: Not on file     Family History: The patient's family history includes Cancer in her maternal grandmother; Healthy in her father and mother; Heart disease in her maternal grandfather.  ROS:   Please see the history of present illness.    All other systems reviewed and are negative.  EKGs/Labs/Other Studies Reviewed:    The following studies were reviewed today: Transesophageal echo 06/06/2020: IMPRESSIONS    1. Left ventricular ejection fraction, by estimation, is 60 to 65%. The  left ventricle has normal function.  2. Right ventricular systolic function is normal. The right ventricular  size is normal.  3. No left atrial/left atrial appendage thrombus was detected.  4. The mitral valve is grossly normal. Trivial mitral valve  regurgitation.  5. The aortic valve is tricuspid.  Aortic valve regurgitation is not  visualized.  6. Evidence of atrial level shunting detected by color flow Doppler.  Agitated saline contrast bubble study was positive with shunting observed  within 3-6 cardiac cycles suggestive of interatrial shunt. There is a  moderately sized patent foramen ovale  with bidirectional shunting across atrial septum.   Conclusion(s)/Recommendation(s): Findings are concerning for an  interatrial shunt as detailed above. Moderate sized bidirectional PFO.  Given young age and recent stroke, would recommend structural heart team  evaluation for PFO closure.   Transcranial Doppler study 05/25/2020: Summary:     A vascular evaluation was performed. The left middle cerebral artery was  studied. An IV was inserted into the patient's left PICC line. Verbal  informed consent was obtained.    HITS (high intensity transient signals) were heard at rest, suggestive of  a possible Spencer Grade  3 PFO.   Positive TCdDBubble study indicative of a moderate size right to left  shunt   EKG:  EKG is ordered today.  The ekg ordered today demonstrates NSr 98 bpm, within normal limits  Recent Labs: 05/22/2020: TSH 0.050 06/04/2020: B Natriuretic Peptide 21.4; Magnesium 1.9 06/08/2020: ALT 43 06/15/2020: BUN <5; Creatinine, Ser 0.66; Hemoglobin 9.8; Platelets 328; Potassium 3.8; Sodium 138  Recent Lipid Panel    Component Value Date/Time   CHOL 156 05/20/2020 0958   TRIG 105 05/20/2020 0958   TRIG 104 05/20/2020 0958   HDL 57 05/20/2020 0958   CHOLHDL 2.7 05/20/2020 0958   VLDL 21 05/20/2020 0958   LDLCALC 78 05/20/2020 0958     Risk Assessment/Calculations:       Physical Exam:    VS:  BP 112/68    Pulse 98    Ht 5\' 3"  (1.6 m)    Wt 170 lb (77.1 kg)    SpO2 98%    BMI 30.11 kg/m     Wt Readings from Last 3 Encounters:  07/11/20 170 lb (77.1 kg)  06/27/20 165 lb (74.8 kg)  06/07/20 171 lb 11.8 oz (77.9 kg)     GEN:  Well nourished, well developed in  no acute distress HEENT: Normal NECK: No JVD; No carotid bruits LYMPHATICS: No lymphadenopathy CARDIAC: RRR, no murmurs, rubs, gallops RESPIRATORY:  Clear to auscultation without rales, wheezing or rhonchi  ABDOMEN: Soft, non-tender, non-distended MUSCULOSKELETAL:  No edema; No deformity  SKIN: Warm and dry NEUROLOGIC:  Alert and oriented x 3 PSYCHIATRIC:  Normal affect   ASSESSMENT:    1. PFO (patent foramen ovale)   2. Acute deep vein thrombosis (DVT) of distal vein of right lower extremity (HCC)    PLAN:    In order of problems listed above:  1. I have personally reviewed the patient's TEE study and she has a moderate-large PFO. She presented with an acute stroke on 8/21. She was later diagnosed with right lower extremity DVT on 8-27. We reviewed the association of PFO and cryptogenic stroke today. We discussed available clinical trial evidence. The patient's stroke occurred in the context of Covid-19 infection, known to be associated with thrombotic events. Considering her young age and relatively large PFO, it seems reasonable to pursue transcatheter closure of the PFO. The patient's Rope Score = 10, indicating a high probability that her stroke is PFO-related. I have reviewed risks, indications, and alternatives to PFO closure with the patient and her mother who is present. I have demonstrated the device to them and discussed procedural steps. They understand the risk of serious complications including device embolization, stroke, MI, perforation, tamponade, emergency surgery, and death, occur at very low frequency of less than 1%. The patient has an upcoming visit with Dr [id number] for post-hospital neurology follow-up. I will plan to review her case with Dr Pearlean Brownie and discuss anticoagulant/antiplatelet Rx. I would consider a total of 6 months of apixaban for treatment of PE, and would add ASA 81 mg for device closure of PFO. Will also repeat a venous duplex scan of the right lower extremity  to evaluate for thrombus resolution and plan for transvenous access. Will consider left femoral venous access if there is chronic thrombus.    Medication Adjustments/Labs and Tests Ordered: Current medicines are reviewed at length with the patient today.  Concerns regarding medicines are outlined above.  Orders Placed This Encounter  Procedures   EKG 12-Lead   VAS Pearlean Brownie LOWER EXTREMITY VENOUS (DVT)  No orders of the defined types were placed in this encounter.   Patient Instructions  Medication Instructions:  Your provider recommends that you continue on your current medications as directed. Please refer to the Current Medication list given to you today.   *If you need a refill on your cardiac medications before your next appointment, please call your pharmacy*  Testing/Procedures: Your physician has requested that you have a lower extremity venous duplex in 1-2 weeks. This test is an ultrasound of the veins in the leg. It looks at venous blood flow that carries blood from the heart to the legs or arms. Allow one hour for a Lower Venous exam. There are no restrictions or special instructions.  Follow-Up: You are scheduled for a follow-up appointment on 08/29/2020 at 1:30PM with Dr. Earmon Phoenix assistant, Carlean Jews.  Tentative dates for closure: 12/10 12/13 12/20  12/22    Signed, 1/23, MD  07/13/2020 7:06 PM     [facility]

## 2020-07-13 ENCOUNTER — Encounter: Payer: Self-pay | Admitting: Cardiovascular Disease

## 2020-07-16 MED ORDER — PANTOPRAZOLE SODIUM 40 MG PO TBEC
40.0000 mg | DELAYED_RELEASE_TABLET | Freq: Every day | ORAL | 5 refills | Status: DC
Start: 2020-07-16 — End: 2020-07-25

## 2020-07-16 MED ORDER — POTASSIUM CHLORIDE 20 MEQ PO PACK
20.0000 meq | PACK | Freq: Every day | ORAL | 5 refills | Status: DC
Start: 2020-07-16 — End: 2020-11-16

## 2020-07-16 MED ORDER — FOLIC ACID 1 MG PO TABS
1.0000 mg | ORAL_TABLET | Freq: Every day | ORAL | 5 refills | Status: DC
Start: 2020-07-16 — End: 2020-07-25

## 2020-07-16 MED ORDER — ONE-DAILY MULTI VITAMINS PO TABS: 1.0000 | ORAL_TABLET | Freq: Every day | ORAL | 5 refills | Status: AC

## 2020-07-16 MED ORDER — ATORVASTATIN CALCIUM 20 MG PO TABS
20.0000 mg | ORAL_TABLET | Freq: Every day | ORAL | 5 refills | Status: DC
Start: 2020-07-16 — End: 2020-07-25

## 2020-07-16 NOTE — Telephone Encounter (Signed)
Good afternoon. I refilled all meds including the vitamins/supplements with 5 addnl refills.   I checked to see if any labs were available, and I don't see anything even in "Care Everywhere". They may have not been posted yet however.

## 2020-07-25 ENCOUNTER — Ambulatory Visit (HOSPITAL_COMMUNITY)
Admission: RE | Admit: 2020-07-25 | Discharge: 2020-07-25 | Disposition: A | Discharge status: Home / Self Care | Payer: BC Managed Care – PPO | Source: Ambulatory Visit | Attending: Cardiovascular Disease | Admitting: Cardiovascular Disease

## 2020-07-25 ENCOUNTER — Other Ambulatory Visit: Payer: Self-pay

## 2020-07-25 ENCOUNTER — Other Ambulatory Visit: Payer: Self-pay | Admitting: Physical Medicine and Rehabilitation

## 2020-07-25 DIAGNOSIS — I824Z1 Acute embolism and thrombosis of unspecified deep veins of right distal lower extremity: Secondary | ICD-10-CM

## 2020-07-25 MED ORDER — ATORVASTATIN CALCIUM 20 MG PO TABS
20.0000 mg | ORAL_TABLET | Freq: Every day | ORAL | 5 refills | Status: DC
Start: 2020-07-25 — End: 2020-11-16

## 2020-07-25 MED ORDER — FOLIC ACID 1 MG PO TABS
1.0000 mg | ORAL_TABLET | Freq: Every day | ORAL | 5 refills | Status: DC
Start: 2020-07-25 — End: 2020-11-16

## 2020-07-25 MED ORDER — PANTOPRAZOLE SODIUM 40 MG PO TBEC
40.0000 mg | DELAYED_RELEASE_TABLET | Freq: Every day | ORAL | 5 refills | Status: DC
Start: 2020-07-25 — End: 2020-11-16

## 2020-07-25 NOTE — Telephone Encounter (Signed)
Refilled meds today and sent them to [facility]. Spoke to mom and patient about doppler results, and other considerations

## 2020-07-30 ENCOUNTER — Encounter: Payer: Self-pay | Admitting: Neurology

## 2020-07-30 ENCOUNTER — Ambulatory Visit: Payer: BC Managed Care – PPO | Admitting: Neurology

## 2020-07-30 VITALS — BP 136/80 | HR 93 | Ht 63.0 in | Wt 169.8 lb

## 2020-07-30 DIAGNOSIS — Q211 Atrial septal defect: Secondary | ICD-10-CM | POA: Diagnosis not present

## 2020-07-30 DIAGNOSIS — I82402 Acute embolism and thrombosis of unspecified deep veins of left lower extremity: Secondary | ICD-10-CM

## 2020-07-30 DIAGNOSIS — G3184 Mild cognitive impairment, so stated: Secondary | ICD-10-CM

## 2020-07-30 DIAGNOSIS — I6602 Occlusion and stenosis of left middle cerebral artery: Secondary | ICD-10-CM

## 2020-07-30 DIAGNOSIS — I749 Embolism and thrombosis of unspecified artery: Secondary | ICD-10-CM

## 2020-07-30 DIAGNOSIS — Q2112 Patent foramen ovale: Secondary | ICD-10-CM

## 2020-07-30 DIAGNOSIS — U071 COVID-19: Secondary | ICD-10-CM

## 2020-07-30 NOTE — Patient Instructions (Addendum)
I had a long d/w patient and her mother about her recent stroke, post stroke mild cognitive impairment, paradoxical embolism and PFO,risk for recurrent stroke/TIAs, personally independently reviewed imaging studies and stroke evaluation results and answered questions.Continue Eliquis (apixaban) daily  for secondary stroke prevention and maintain strict control of hypertension with blood pressure goal below 130/90, diabetes with hemoglobin A1c goal below 6.5% and lipids with LDL cholesterol goal below 70 mg/dL. I also advised the patient to eat a healthy diet with plenty of whole grains, cereals, fruits and vegetables, exercise regularly and maintain ideal body weight.  I recommend referral to neuropsychology testing to have accommodations for returning to college for her studies.  We encouraged her to increase participation in cognitively challenging activities like solving crossword puzzles, playing bridge and sodoku.  We also discussed memory compensation strategies.  The patient may start driving but I advised her to increase driving gradually as tolerated.  She will have elective endovascular PFO closure in December by Dr. Excell Seltzer.  Followup in the future with me in 3 months or call earlier if necessary  Memory Compensation Strategies  1. Use "WARM" strategy.  W= write it down  A= associate it  R= repeat it  M= make a mental note  2.   You can keep a Glass blower/designer.  Use a 3-ring notebook with sections for the following: calendar, important names and phone numbers,  medications, doctors' names/phone numbers, lists/reminders, and a section to journal what you did  each day.   3.    Use a calendar to write appointments down.  4.    Write yourself a schedule for the day.  This can be placed on the calendar or in a separate section of the Memory Notebook.  Keeping a  regular schedule can help memory.  5.    Use medication organizer with sections for each day or morning/evening pills.  You may need  help loading it  6.    Keep a basket, or pegboard by the door.  Place items that you need to take out with you in the basket or on the pegboard.  You may also want to  include a message board for reminders.  7.    Use sticky notes.  Place sticky notes with reminders in a place where the task is performed.  For example: " turn off the  stove" placed by the stove, "lock the door" placed on the door at eye level, " take your medications" on  the bathroom mirror or by the place where you normally take your medications.  8.    Use alarms/timers.  Use while cooking to remind yourself to check on food or as a reminder to take your medicine, or as a  reminder to make a call, or as a reminder to perform another task, etc.  Stroke Prevention Some medical conditions and behaviors are associated with a higher chance of having a stroke. You can help prevent a stroke by making nutrition, lifestyle, and other changes, including managing any medical conditions you may have. What nutrition changes can be made?   Eat healthy foods. You can do this by: ? Choosing foods high in fiber, such as fresh fruits and vegetables and whole grains. ? Eating at least 5 or more servings of fruits and vegetables a day. Try to fill half of your plate at each meal with fruits and vegetables. ? Choosing lean protein foods, such as lean cuts of meat, poultry without skin, fish, tofu, beans, and  nuts. ? Eating low-fat dairy products. ? Avoiding foods that are high in salt (sodium). This can help lower blood pressure. ? Avoiding foods that have saturated fat, trans fat, and cholesterol. This can help prevent high cholesterol. ? Avoiding processed and premade foods.  Follow your health care provider's specific guidelines for losing weight, controlling high blood pressure (hypertension), lowering high cholesterol, and managing diabetes. These may include: ? Reducing your daily calorie intake. ? Limiting your daily sodium intake to  1,500 milligrams (mg). ? Using only healthy fats for cooking, such as olive oil, canola oil, or sunflower oil. ? Counting your daily carbohydrate intake. What lifestyle changes can be made?  Maintain a healthy weight. Talk to your health care provider about your ideal weight.  Get at least 30 minutes of moderate physical activity at least 5 days a week. Moderate activity includes brisk walking, biking, and swimming.  Do not use any products that contain nicotine or tobacco, such as cigarettes and e-cigarettes. If you need help quitting, ask your health care provider. It may also be helpful to avoid exposure to secondhand smoke.  Limit alcohol intake to no more than 1 drink a day for nonpregnant women and 2 drinks a day for men. One drink equals 12 oz of beer, 5 oz of wine, or 1 oz of hard liquor.  Stop any illegal drug use.  Avoid taking birth control pills. Talk to your health care provider about the risks of taking birth control pills if: ? You are over 66 years old. ? You smoke. ? You get migraines. ? You have ever had a blood clot. What other changes can be made?  Manage your cholesterol levels. ? Eating a healthy diet is important for preventing high cholesterol. If cholesterol cannot be managed through diet alone, you may also need to take medicines. ? Take any prescribed medicines to control your cholesterol as told by your health care provider.  Manage your diabetes. ? Eating a healthy diet and exercising regularly are important parts of managing your blood sugar. If your blood sugar cannot be managed through diet and exercise, you may need to take medicines. ? Take any prescribed medicines to control your diabetes as told by your health care provider.  Control your hypertension. ? To reduce your risk of stroke, try to keep your blood pressure below 130/80. ? Eating a healthy diet and exercising regularly are an important part of controlling your blood pressure. If your blood  pressure cannot be managed through diet and exercise, you may need to take medicines. ? Take any prescribed medicines to control hypertension as told by your health care provider. ? Ask your health care provider if you should monitor your blood pressure at home. ? Have your blood pressure checked every year, even if your blood pressure is normal. Blood pressure increases with age and some medical conditions.  Get evaluated for sleep disorders (sleep apnea). Talk to your health care provider about getting a sleep evaluation if you snore a lot or have excessive sleepiness.  Take over-the-counter and prescription medicines only as told by your health care provider. Aspirin or blood thinners (antiplatelets or anticoagulants) may be recommended to reduce your risk of forming blood clots that can lead to stroke.  Make sure that any other medical conditions you have, such as atrial fibrillation or atherosclerosis, are managed. What are the warning signs of a stroke? The warning signs of a stroke can be easily remembered as BEFAST.  B is for balance.  Signs include: ? Dizziness. ? Loss of balance or coordination. ? Sudden trouble walking.  E is for eyes. Signs include: ? A sudden change in vision. ? Trouble seeing.  F is for face. Signs include: ? Sudden weakness or numbness of the face. ? The face or eyelid drooping to one side.  A is for arms. Signs include: ? Sudden weakness or numbness of the arm, usually on one side of the body.  S is for speech. Signs include: ? Trouble speaking (aphasia). ? Trouble understanding.  T is for time. ? These symptoms may represent a serious problem that is an emergency. Do not wait to see if the symptoms will go away. Get medical help right away. Call your local emergency services (911 in the [place] Do not drive yourself to the hospital.  Other signs of stroke may include: ? A sudden, severe headache with no known cause. ? Nausea or  vomiting. ? Seizure. Where to find more information For more information, visit:  American Stroke Association: www.strokeassociation.org  National Stroke Association: www.stroke.org Summary  You can prevent a stroke by eating healthy, exercising, not smoking, limiting alcohol intake, and managing any medical conditions you may have.  Do not use any products that contain nicotine or tobacco, such as cigarettes and e-cigarettes. If you need help quitting, ask your health care provider. It may also be helpful to avoid exposure to secondhand smoke.  Remember BEFAST for warning signs of stroke. Get help right away if you or a loved one has any of these signs. This information is not intended to replace advice given to you by your health care provider. Make sure you discuss any questions you have with your health care provider. Document Revised: 08/28/2017 Document Reviewed: 10/21/2016 Elsevier Patient Education  2020 [employer].

## 2020-07-30 NOTE — Progress Notes (Signed)
[facility] [address] [place]. [place] [address] [phone]       OFFICE FOLLOW-UP NOTE  Ms. Shanoah Asbill Date of Birth:  [birth year] Medical Record Number:  [id number]   HPI: Ms. Gaynelle Adu is a 24 year old African-American lady seen today for initial office follow-up visit following hospital admission for stroke in August 2021.  History is obtained from the patient and her mother was accompanying her as well as review of electronic medical records.  I personally reviewed available pertinent imaging films in PACS.  Patient had received a second dose of Covid Pfizer vaccine about 2 weeks prior to admission but he had presented with Covid positive blood test She presented on 05/19/2020 with sudden onset of aphasia and right hemiparesis.  Her boyfriend called 911 and a code stroke was called in route.  Emergent CT scan of the head showed a dense left MCA sign and code IR was activated.  Patient was started on IV TPA and CT angiogram confirmed distal left ICA occlusion extending into the MCA and ACA.  The terminal ICA and MCA were revascularized relatively easily but the clot migrated distally into the ACA which was difficult to reach.  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  Postprocedure CT scan showed a large amount of subarachnoid hemorrhage with extension into the ventricles in the left frontal parasagittal region.  Patient was initially kept in the ICU and subsequently did well.  She was sleepy initially and had some apathy but gradually improved.  MRI of the brain on 05/20/2020 showed early subacute infarct involving left basal ganglia and posterior putamen and caudate body and CT  scan showed left frontal parasagittal hematoma.  CT scan of chest abdomen pelvis showed no evidence of malignancy with probable nonocclusive thrombus involving the right femoral vein.  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  LDL cholesterol was 78 mg percent hemoglobin A1c was 5.6.  Urine drug screen was positive for marijuana.  Patient was started on Eliquis anticoagulation about 2 weeks after acute hemorrhage and stroke patient was kept in the ICU initially and managed conservatively for her hydrocephalus with hypertonic saline and made gradual improvement.  TCD bubble study had shown a PFO which was subsequently confirmed on the TEE.  Patient states she is done well she is currently doing outpatient speech therapy and in fact has been discharged from there as well.  She has mild short-term memory difficulties as well as problems with attention and multitasking.  She in fact seems quite anxious to return back to school but is not quite sure whether she will be able to handle her academic's.  She has not been driving and is keen to drive.  She has seen Dr. Excell Seltzer will schedule endovascular PFO closure on 09/07/2020.  She is tolerating Eliquis well without bruising or bleeding.  She is also on Lipitor which is tolerating well without muscle aches and pains.  Blood pressures well controlled and today it is 136/80.  She is physically recovered well from a stroke without any speech or language problems or weakness.  ROS:   14  system review of systems is positive for memory loss, attention difficulties, decreased stamina, tiredness and all other systems negative  PMH:  Past Medical History:  Diagnosis Date  . Anxiety   . Panic attack   . Stroke [facility](HCC) 05/19/2020    Social History:  Social History   Socioeconomic History  . Marital status: Single    Spouse name: Not on file  . Number of  children: Not on file  . Years of education: Not on file  . Highest education level: Some college, no degree  Occupational History  . 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 Smoking status: Former Smoker    Types: Cigarettes    Quit date: 05/19/2020    Years since quitting: 0.1  . Smokeless tobacco: Never Used  Vaping Use  . Vaping Use: Never used  Substance and Sexual Activity  . Alcohol use: Yes    Comment: OCCASIONAL   . Drug use: Yes    Types: Marijuana  . Sexual activity: Not on file  Other Topics Concern  . Not on file  Social History Narrative   Lives at home with parents   Right Handed   Drinks 1-2 cups caffeine daily   Social Determinants of Health   Financial Resource Strain:   . Difficulty of Paying Living Expenses: Not on file  Food Insecurity:   . Worried About Programme researcher, broadcasting/film/videounning Out of Food in the Last Year: Not on file  . Ran Out of Food in the Last Year: Not on file  Transportation Needs:   . Lack of Transportation (Medical): Not on file  . Lack of Transportation (Non-Medical): Not on file  Physical Activity:   . Days of Exercise per Week: Not on file  . Minutes of Exercise per Session: Not on file  Stress:   . Feeling of Stress : Not on file  Social Connections:   . Frequency of Communication with Friends and Family: Not on file  . Frequency of Social Gatherings with Friends and Family: Not on file  . Attends Religious Services: Not on file  . Active Member of Clubs or Organizations: Not on file  . Attends BankerClub or Organization Meetings: Not on file  . Marital Status: Not on file  Intimate Partner Violence:   . Fear of Current or Ex-Partner: Not on file  . Emotionally Abused: Not on file  . Physically Abused: Not on file  . Sexually Abused: Not on file    Medications:   Current Outpatient Medications on File Prior to Visit  Medication Sig Dispense Refill  . acetaminophen (TYLENOL) 325 MG tablet Take 1-2 tablets (325-650 mg total)  by mouth every 4 (four) hours as needed for mild pain or headache.    Marland Kitchen. alum & mag hydroxide-simeth (MAALOX/MYLANTA) 200-200-20 MG/5ML suspension Take 30 mLs by mouth every 4 (four) hours as needed for indigestion. 355 mL 0  . apixaban (ELIQUIS) 5 MG TABS tablet Take 1 tablet (5 mg total) by mouth 2 (two) times daily. 60 tablet 0  . atorvastatin (LIPITOR) 20 MG tablet Take 1 tablet (20 mg total) by mouth daily. 30 tablet 5  . folic acid (FOLVITE) 1 MG tablet Take 1 tablet (1 mg total) by mouth daily. 30 tablet 5  . Multiple Vitamin (MULTIVITAMIN) tablet Take 1 tablet by mouth daily. 30 tablet 5  . pantoprazole (PROTONIX) 40 MG tablet Take 1 tablet (40 mg total) by mouth daily. 30 tablet 5  . potassium chloride (KLOR-CON) 20  MEQ packet Take 20 mEq by mouth daily. 30 packet 5   No current facility-administered medications on file prior to visit.    Allergies:   Allergies  Allergen Reactions  . 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    Physical Exam General: Mildly obese young African-American lady seated, in no evident distress Head: head normocephalic and atraumatic.  Neck: supple with no carotid or supraclavicular bruits Cardiovascular: regular rate and rhythm, no murmurs Musculoskeletal: no deformity Skin:  no rash/petichiae Vascular:  Normal pulses all extremities Vitals:   07/30/20 1059  BP: 136/80  Pulse: 93   Neurologic Exam Mental Status: Awake and fully alert. Oriented to place and time. Recent and remote memory intact. Attention span, concentration and fund of knowledge slightly diminished. Mood and affect appropriate.  Mini-Mental status exam score 27/30 with 2 deficits in recall and 1 inattention.  Able to name 12 animals which can walk on 4 legs.  Clock drawing 4/4.  Able to copy intersecting pentagons.  Depression scale scored 6/3 suggestive of mild  depression. Cranial Nerves: Fundoscopic exam reveals sharp disc margins. Pupils equal, briskly reactive to light. Extraocular movements full without nystagmus. Visual fields full to confrontation. Hearing intact. Facial sensation intact. Face, tongue, palate moves normally and symmetrically.  Motor: Normal bulk and tone. Normal strength in all tested extremity muscles.  Diminished fine finger movements on the right.  Orbits left over right upper extremity. Sensory.: intact to touch ,pinprick .position and vibratory sensation.  Coordination: Rapid alternating movements normal in all extremities. Finger-to-nose and heel-to-shin performed accurately bilaterally. Gait and Station: Arises from chair without difficulty. Stance is normal. Gait demonstrates normal stride length and balance . Able to heel, toe and tandem walk with slight difficulty.  Reflexes: 1+ and symmetric. Toes downgoing.   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 I also advised the patient to eat a healthy diet with plenty of whole grains, cereals, fruits and vegetables, exercise regularly and maintain ideal body weight.  I  recommend referral to [facility] for  neuropsychology testing to have accommodations for returning to college for her studies.  We encouraged her to increase participation in cognitively challenging activities like solving crossword puzzles, playing bridge and sodoku.  We also discussed memory compensation strategies.  The patient may start driving but I advised her to increase driving gradually as tolerated.  She will have elective endovascular PFO closure in December by Dr. Excell Seltzer.  Followup in the future with me in 3 months or call earlier if necessary  Greater than 50% of time during this prolonged 45 minute visit was spent on counseling,explanation of diagnosis of stroke, paradoxical  embolism, PFO, mild cognitive impairment discussion, planning of further management, discussion with patient and family and coordination of care Delia Heady, MD Note: This document was prepared with digital dictation and possible smart phrase technology. Any transcriptional errors that result from this process are unintentional

## 2020-08-22 ENCOUNTER — Ambulatory Visit: Payer: BC Managed Care – PPO | Admitting: Physical Medicine & Rehabilitation

## 2020-08-27 NOTE — Telephone Encounter (Signed)
Called patient to ask what student accommodations she is asking for.  She stated she is asking for extended time regarding tests, essays and projects, an extra 3 days for due dates.

## 2020-08-27 NOTE — Progress Notes (Addendum)
[facility]   MULTIDISCIPLINARY HEART VALVE CLINIC                                       Cardiology Office Note    Date:  08/29/2020   ID:  Gail Montgomery, DOB Jun 11, 1996, MRN [id number]  PCP:  Shirlean Mylar, MD  Cardiologist:  Dr. Excell Seltzer  CC: discuss PFO closure   History of Present Illness:  Gail Montgomery is a 24 y.o. adult with a history of Covid 19, obesity, DVT/PE, CVA and PFO who presents to clinic to discuss PFO closure.   The patient was in her normal state of health when she presented with right-sided weakness and expressive aphasia that started May 19, 2020. 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  This occurred in the setting of a PFO, oral contraceptive use, and possibly Covid hypercoagulability.  During her index hospitalization, she underwent transesophageal echo demonstrating a moderate PFO with bidirectional shunting on color Doppler.  Transcranial Doppler study demonstrated Spencer grade 3 shunt at rest. During her hospitalization, she initially had a negative lower extremity venous duplex. However, a follow-up venous duplex demonstrated extensive thrombus in the right femoral veins. She then underwent a chest CTA demonstrating bilateral pulmonary embolus. Of note, she was fully vaccinated for Covid, but tested positive at the time of her hospital admission. She has been treated with Eliquis.   She was seen by Dr. Excell Seltzer on 07/11/20 for discussion about percutaneous PFO closure. He felt she would be a good candidate but wanted to reassess right LE DVT for procedural planning. Repeat LE dopplers on 07/25/20 showed findings consistent with chronic non-occlusive deep vein thrombosis involving the right proximal, mid, and distal femoral vein, and right popliteal vein. Findings appear improved from previous examination.   Today she presents to clinic for follow up. Here with her  mother.No CP or SOB. No LE edema, orthopnea or PND. No dizziness or syncope. No blood in stool or urine. No palpitations. No pain in right LE but can tell a size difference.   Past Medical History:  Diagnosis Date  . Anxiety   . Panic attack   . Stroke [facility]) 05/19/2020    Past Surgical History:  Procedure Laterality Date  . BUBBLE STUDY  06/06/2020   Procedure: BUBBLE STUDY;  Surgeon: Chrystie Nose, MD;  Location: [facility] ENDOSCOPY;  Service: Cardiovascular;;  . IR CT HEAD LTD  05/19/2020  . IR CT HEAD LTD  05/19/2020  . IR PERCUTANEOUS ART THROMBECTOMY/INFUSION INTRACRANIAL INC DIAG ANGIO  05/19/2020  . IR US GUIDE VASC ACCESS RIGHT  05/19/2020  . RADIOLOGY WITH ANESTHESIA N/A 05/19/2020   Procedure: IR WITH ANESTHESIA;  Surgeon: Radiologist, Medication, MD;  Location: [facility] OR;  Service: Radiology;  Laterality: N/A;  . TEE WITHOUT CARDIOVERSION N/A 06/06/2020   Procedure: TRANSESOPHAGEAL ECHOCARDIOGRAM (TEE);  Surgeon: Chrystie Nose, MD;  Location: [facility] ENDOSCOPY;  Service: Cardiovascular;  Laterality: N/A;    Current Medications: Outpatient Medications Prior to Visit  Medication Sig Dispense Refill  . acetaminophen (TYLENOL) 325 MG tablet Take 1-2 tablets (325-650 mg total) by mouth every 4 (four) hours as needed for mild pain or headache.    Marland Kitchen apixaban (ELIQUIS) 5 MG TABS tablet Take 1 tablet (5 mg total) by mouth 2 (two) times daily. 60 tablet 0  .  atorvastatin (LIPITOR) 20 MG tablet Take 1 tablet (20 mg total) by mouth daily. 30 tablet 5  . folic acid (FOLVITE) 1 MG tablet Take 1 tablet (1 mg total) by mouth daily. 30 tablet 5  . Multiple Vitamin (MULTIVITAMIN) tablet Take 1 tablet by mouth daily. 30 tablet 5  . pantoprazole (PROTONIX) 40 MG tablet Take 1 tablet (40 mg total) by mouth daily. 30 tablet 5  . potassium chloride (KLOR-CON) 20 MEQ packet Take 20 mEq by mouth daily. 30 packet 5  . alum & mag hydroxide-simeth (MAALOX/MYLANTA) 200-200-20 MG/5ML suspension Take 30 mLs by mouth every  4 (four) hours as needed for indigestion. (Patient not taking: Reported on 08/29/2020) 355 mL 0   No facility-administered medications prior to visit.     Allergies:   Other   Social History   Socioeconomic History  . Marital status: Single    Spouse name: Not on file  . Number of children: Not on file  . Years of education: Not on file  . Highest education level: Some college, no degree  Occupational History  . 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 Smoking status: Former Smoker    Types: Cigarettes    Quit date: 05/19/2020    Years since quitting: 0.2  . Smokeless tobacco: Never Used  Vaping Use  . Vaping Use: Never used  Substance and Sexual Activity  . Alcohol use: Yes    Comment: OCCASIONAL   . Drug use: Yes    Types: Marijuana  . Sexual activity: Not on file  Other Topics Concern  . Not on file  Social History Narrative   Lives at home with parents   Right Handed   Drinks 1-2 cups caffeine daily   Social Determinants of Health   Financial Resource Strain:   . Difficulty of Paying Living Expenses: Not on file  Food Insecurity:   . Worried About Programme researcher, broadcasting/film/videounning Out of Food in the Last Year: Not on file  . Ran Out of Food in the Last Year: Not on file  Transportation Needs:   . Lack of Transportation (Medical): Not on file  . Lack of Transportation (Non-Medical): Not on file  Physical Activity:   . Days of Exercise per Week: Not on file  . Minutes of Exercise per Session: Not on file  Stress:   . Feeling of Stress : Not on file  Social Connections:   . Frequency of Communication with Friends and Family: Not on file  . Frequency of Social Gatherings with Friends and Family: Not on file  . Attends Religious Services: Not on file  . Active Member of Clubs or Organizations: Not on file  . Attends BankerClub or Organization Meetings: Not on file  . Marital Status: Not on file     Family History:  The patient's family history includes Cancer in her  maternal grandmother; Healthy in her father and mother; Heart disease in her maternal grandfather.     ROS:   Please see the history of present illness.    ROS All other systems reviewed and are negative.   PHYSICAL EXAM:   VS:  BP 100/66   Pulse 77   Ht 5\' 3"  (1.6 m)   Wt 172 lb (78 kg)   SpO2 100%   BMI 30.47 kg/m    GEN: Well nourished, well developed, in no acute distress HEENT: normal Neck: no JVD or masses Cardiac: RRR; no murmurs, rubs, or gallops,no edema  Respiratory:  clear to auscultation bilaterally, normal work of breathing GI: soft, nontender, nondistended, + BS MS: no deformity or atrophy Skin: warm and dry, no rash Neuro:  Alert and Oriented x 3, Strength and sensation are intact Psych: euthymic mood, full affect   Wt Readings from Last 3 Encounters:  08/29/20 172 lb (78 kg)  07/30/20 169 lb 12.8 oz (77 kg)  07/11/20 170 lb (77.1 kg)      Studies/Labs Reviewed:   EKG:  EKG is ordered today.  The ekg ordered today demonstrates sinus  Recent Labs: 05/22/2020: TSH 0.050 06/04/2020: B Natriuretic Peptide 21.4; Magnesium 1.9 06/08/2020: ALT 43 06/15/2020: BUN <5; Creatinine, Ser 0.66; Hemoglobin 9.8; Platelets 328; Potassium 3.8; Sodium 138   Lipid Panel    Component Value Date/Time   CHOL 156 05/20/2020 0958   TRIG 105 05/20/2020 0958   TRIG 104 05/20/2020 0958   HDL 57 05/20/2020 0958   CHOLHDL 2.7 05/20/2020 0958   VLDL 21 05/20/2020 0958   LDLCALC 78 05/20/2020 0958    Additional studies/ records that were reviewed today include:  TEE 06/06/2020: IMPRESSIONS 1. Left ventricular ejection fraction, by estimation, is 60 to 65%. The  left ventricle has normal function.  2. Right ventricular systolic function is normal. The right ventricular  size is normal.  3. No left atrial/left atrial appendage thrombus was detected.  4. The mitral valve is grossly normal. Trivial mitral valve  regurgitation.  5. The aortic valve is tricuspid. Aortic valve  regurgitation is not  visualized.  6. Evidence of atrial level shunting detected by color flow Doppler.  Agitated saline contrast bubble study was positive with shunting observed  within 3-6 cardiac cycles suggestive of interatrial shunt. There is a  moderately sized patent foramen ovale  with bidirectional shunting across atrial septum.   Conclusion(s)/Recommendation(s): Findings are concerning for an  interatrial shunt as detailed above. Moderate sized bidirectional PFO.  Given young age and recent stroke, would recommend structural heart team  evaluation for PFO closure.   _____________________   Transcranial Doppler study 05/25/2020: Summary:   A vascular evaluation was performed. The left middle cerebral artery was  studied. An IV was inserted into the patient's left PICC line. Verbal  informed consent was obtained.    HITS (high intensity transient signals) were heard at rest, suggestive of  a possible Spencer Grade 3 PFO.   Positive TCdDBubble study indicative of a moderate size right to left  shunt   ________________________   LE duplex 07/25/20  Summary:  RIGHT:  - Findings consistent with chronic non-occlusive deep vein thrombosis  involving the right proximal, mid, and distal femoral vein, and right  popliteal vein.  - Findings appear improved from previous examination.  - No cystic structure found in the popliteal fossa.    LEFT:  - No evidence of common femoral vein obstruction.    *See table(s) above for measurements and observations.   Electronically signed by Lorine Bears MD on 07/25/2020 at 4:45:00 PM.    ASSESSMENT & PLAN:   PFO with cryptogenic CVA: planned for PFO closure on 12/10. Pre op labs and ECG done today. Full consent done during consultation with Dr. Excell Seltzer on 10/13. She will start a baby aspirin and continue on Eliquis until the day before surgery.   Chronic RLE DVT: repeat LE dopplers on 07/25/20 showed findings consistent with  chronic non-occlusive deep vein thrombosis involving the right proximal, mid, and distal femoral vein, and right popliteal vein. Findings appear improved from previous examination.  DVT/PE: has been treated with Eliquis for 6 months. [employer] will not pay for it past 6 months. Given chronic DVT will discuss with MD on if anticoagulation can be safely discontinued after 6 months. Will plan to refer to hematology to weigh in on timing.  Medication Adjustments/Labs and Tests Ordered: Current medicines are reviewed at length with the patient today.  Concerns regarding medicines are outlined above.  Medication changes, Labs and Tests ordered today are listed in the Patient Instructions below. Patient Instructions  Medication Instructions:  No changes today *If you need a refill on your cardiac medications before your next appointment, please call your pharmacy*   Lab Work: Today: cbc, bmet  If you have labs (blood work) drawn today and your tests are completely normal, you will receive your results only by: Marland Kitchen MyChart Message (if you have MyChart) OR . A paper copy in the mail If you have any lab test that is abnormal or we need to change your treatment, we will call you to review the results.   Testing/Procedures: Due to recent COVID-19 restrictions implemented by our local and state authorities and in an effort to keep both patients and staff as safe as possible, our hospital system requires COVID-19 testing prior to certain scheduled hospital procedures.  Please go to [phone] [facility]. [place], [place] [address] on 09/04/20 at 11:10 am.  This is a drive up testing site.  You will not need to exit your vehicle.  You must agree to self-quarantine from the time of your testing until the procedure date on 09/07/20.  This should included staying home with ONLY the people you live with.  Avoid take-out, grocery store shopping or leaving the house for any non-emergent reason.  Failure to have your COVID-19  test done on the date and time you have been scheduled will result in cancellation of your procedure.  Please call our office at [phone] if you have any questions.   Follow-Up: At [facility], you and your health needs are our priority.  As part of our continuing mission to provide you with exceptional heart care, we have created designated Provider Care Teams.  These Care Teams include your primary Cardiologist (physician) and Advanced Practice Providers (APPs -  Physician Assistants and Nurse Practitioners) who all work together to provide you with the care you need, when you need it.  Your next appointment:   5 week(s)  See below.  The format for your next appointment:   In Person  Provider:   Carlean Jews, PA-C   Other Instructions See accompanying instruction letter     Signed, Cline Crock, PA-C  08/29/2020 4:43 PM    [facility] [address] [place], [place], [place]  [address] Phone: [phone]; Fax: [phone]

## 2020-08-27 NOTE — H&P (View-Only) (Signed)
[facility]   MULTIDISCIPLINARY HEART VALVE CLINIC                                       Cardiology Office Note    Date:  08/29/2020   ID:  Gail [facility], DOB Jun 11, 1996, MRN [id number]  PCP:  Shirlean Mylar, MD  Cardiologist:  Dr. Excell Seltzer  CC: discuss PFO closure   History of Present Illness:  Gail [facility] is a 24 y.o. adult with a history of Covid 19, obesity, DVT/PE, CVA and PFO who presents to clinic to discuss PFO closure.   The patient was in her normal state of health when she presented with right-sided weakness and expressive aphasia that started May 19, 2020. 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  This occurred in the setting of a PFO, oral contraceptive use, and possibly Covid hypercoagulability.  During her index hospitalization, she underwent transesophageal echo demonstrating a moderate PFO with bidirectional shunting on color Doppler.  Transcranial Doppler study demonstrated Spencer grade 3 shunt at rest. During her hospitalization, she initially had a negative lower extremity venous duplex. However, a follow-up venous duplex demonstrated extensive thrombus in the right femoral veins. She then underwent a chest CTA demonstrating bilateral pulmonary embolus. Of note, she was fully vaccinated for Covid, but tested positive at the time of her [facility] admission. She has been treated with Eliquis.   She was seen by Dr. Excell Seltzer on 07/11/20 for discussion about percutaneous PFO closure. He felt she would be a good candidate but wanted to reassess right LE DVT for procedural planning. Repeat LE dopplers on 07/25/20 showed findings consistent with chronic non-occlusive deep vein thrombosis involving the right proximal, mid, and distal femoral vein, and right popliteal vein. Findings appear improved from previous examination.   Today she presents to clinic for follow up. Here with her  mother.No CP or SOB. No LE edema, orthopnea or PND. No dizziness or syncope. No blood in stool or urine. No palpitations. No pain in right LE but can tell a size difference.   Past Medical History:  Diagnosis Date  . Anxiety   . Panic attack   . Stroke Gail [facility]) 05/19/2020    Past Surgical History:  Procedure Laterality Date  . BUBBLE STUDY  06/06/2020   Procedure: BUBBLE STUDY;  Surgeon: Chrystie Nose, MD;  Location: [facility] ENDOSCOPY;  Service: Cardiovascular;;  . IR CT HEAD LTD  05/19/2020  . IR CT HEAD LTD  05/19/2020  . IR PERCUTANEOUS ART THROMBECTOMY/INFUSION INTRACRANIAL INC DIAG ANGIO  05/19/2020  . IR US GUIDE VASC ACCESS RIGHT  05/19/2020  . RADIOLOGY WITH ANESTHESIA N/A 05/19/2020   Procedure: IR WITH ANESTHESIA;  Surgeon: Radiologist, Medication, MD;  Location: [facility] OR;  Service: Radiology;  Laterality: N/A;  . TEE WITHOUT CARDIOVERSION N/A 06/06/2020   Procedure: TRANSESOPHAGEAL ECHOCARDIOGRAM (TEE);  Surgeon: Chrystie Nose, MD;  Location: [facility] ENDOSCOPY;  Service: Cardiovascular;  Laterality: N/A;    Current Medications: Outpatient Medications Prior to Visit  Medication Sig Dispense Refill  . acetaminophen (TYLENOL) 325 MG tablet Take 1-2 tablets (325-650 mg total) by mouth every 4 (four) hours as needed for mild pain or headache.    Marland Kitchen apixaban (ELIQUIS) 5 MG TABS tablet Take 1 tablet (5 mg total) by mouth 2 (two) times daily. 60 tablet 0  .  atorvastatin (LIPITOR) 20 MG tablet Take 1 tablet (20 mg total) by mouth daily. 30 tablet 5  . folic acid (FOLVITE) 1 MG tablet Take 1 tablet (1 mg total) by mouth daily. 30 tablet 5  . Multiple Vitamin (MULTIVITAMIN) tablet Take 1 tablet by mouth daily. 30 tablet 5  . pantoprazole (PROTONIX) 40 MG tablet Take 1 tablet (40 mg total) by mouth daily. 30 tablet 5  . potassium chloride (KLOR-CON) 20 MEQ packet Take 20 mEq by mouth daily. 30 packet 5  . alum & mag hydroxide-simeth (MAALOX/MYLANTA) 200-200-20 MG/5ML suspension Take 30 mLs by mouth every  4 (four) hours as needed for indigestion. (Patient not taking: Reported on 08/29/2020) 355 mL 0   No facility-administered medications prior to visit.     Allergies:   Other   Social History   Socioeconomic History  . Marital status: Single    Spouse name: Not on file  . Number of children: Not on file  . Years of education: Not on file  . Highest education level: Some college, no degree  Occupational History  . Occupation: part time working, full time student  Tobacco Use  . Smoking status: Former Smoker    Types: Cigarettes    Quit date: 05/19/2020    Years since quitting: 0.2  . Smokeless tobacco: Never Used  Vaping Use  . Vaping Use: Never used  Substance and Sexual Activity  . Alcohol use: Yes    Comment: OCCASIONAL   . Drug use: Yes    Types: Marijuana  . Sexual activity: Not on file  Other Topics Concern  . Not on file  Social History Narrative   Lives at home with parents   Right Handed   Drinks 1-2 cups caffeine daily   Social Determinants of Health   Financial Resource Strain:   . Difficulty of Paying Living Expenses: Not on file  Food Insecurity:   . Worried About Running Out of Food in the Last Year: Not on file  . Ran Out of Food in the Last Year: Not on file  Transportation Needs:   . Lack of Transportation (Medical): Not on file  . Lack of Transportation (Non-Medical): Not on file  Physical Activity:   . Days of Exercise per Week: Not on file  . Minutes of Exercise per Session: Not on file  Stress:   . Feeling of Stress : Not on file  Social Connections:   . Frequency of Communication with Friends and Family: Not on file  . Frequency of Social Gatherings with Friends and Family: Not on file  . Attends Religious Services: Not on file  . Active Member of Clubs or Organizations: Not on file  . Attends Club or Organization Meetings: Not on file  . Marital Status: Not on file     Family History:  The patient's family history includes Cancer in her  maternal grandmother; Healthy in her father and mother; Heart disease in her maternal grandfather.     ROS:   Please see the history of present illness.    ROS All other systems reviewed and are negative.   PHYSICAL EXAM:   VS:  BP 100/66   Pulse 77   Ht 5' 3" (1.6 m)   Wt 172 lb (78 kg)   SpO2 100%   BMI 30.47 kg/m    GEN: Well nourished, well developed, in no acute distress HEENT: normal Neck: no JVD or masses Cardiac: RRR; no murmurs, rubs, or gallops,no edema  Respiratory:    clear to auscultation bilaterally, normal work of breathing GI: soft, nontender, nondistended, + BS MS: no deformity or atrophy Skin: warm and dry, no rash Neuro:  Alert and Oriented x 3, Strength and sensation are intact Psych: euthymic mood, full affect   Wt Readings from Last 3 Encounters:  08/29/20 172 lb (78 kg)  07/30/20 169 lb 12.8 oz (77 kg)  07/11/20 170 lb (77.1 kg)      Studies/Labs Reviewed:   EKG:  EKG is ordered today.  The ekg ordered today demonstrates sinus  Recent Labs: 05/22/2020: TSH 0.050 06/04/2020: B Natriuretic Peptide 21.4; Magnesium 1.9 06/08/2020: ALT 43 06/15/2020: BUN <5; Creatinine, Ser 0.66; Hemoglobin 9.8; Platelets 328; Potassium 3.8; Sodium 138   Lipid Panel    Component Value Date/Time   CHOL 156 05/20/2020 0958   TRIG 105 05/20/2020 0958   TRIG 104 05/20/2020 0958   HDL 57 05/20/2020 0958   CHOLHDL 2.7 05/20/2020 0958   VLDL 21 05/20/2020 0958   LDLCALC 78 05/20/2020 0958    Additional studies/ records that were reviewed today include:  TEE 06/06/2020: IMPRESSIONS 1. Left ventricular ejection fraction, by estimation, is 60 to 65%. The  left ventricle has normal function.  2. Right ventricular systolic function is normal. The right ventricular  size is normal.  3. No left atrial/left atrial appendage thrombus was detected.  4. The mitral valve is grossly normal. Trivial mitral valve  regurgitation.  5. The aortic valve is tricuspid. Aortic valve  regurgitation is not  visualized.  6. Evidence of atrial level shunting detected by color flow Doppler.  Agitated saline contrast bubble study was positive with shunting observed  within 3-6 cardiac cycles suggestive of interatrial shunt. There is a  moderately sized patent foramen ovale  with bidirectional shunting across atrial septum.   Conclusion(s)/Recommendation(s): Findings are concerning for an  interatrial shunt as detailed above. Moderate sized bidirectional PFO.  Given young age and recent stroke, would recommend structural heart team  evaluation for PFO closure.   _____________________   Transcranial Doppler study 05/25/2020: Summary:   A vascular evaluation was performed. The left middle cerebral artery was  studied. An IV was inserted into the patient's left PICC line. Verbal  informed consent was obtained.    HITS (high intensity transient signals) were heard at rest, suggestive of  a possible Spencer Grade 3 PFO.   Positive TCdDBubble study indicative of a moderate size right to left  shunt   ________________________   LE duplex 07/25/20  Summary:  RIGHT:  - Findings consistent with chronic non-occlusive deep vein thrombosis  involving the right proximal, mid, and distal femoral vein, and right  popliteal vein.  - Findings appear improved from previous examination.  - No cystic structure found in the popliteal fossa.    LEFT:  - No evidence of common femoral vein obstruction.    *See table(s) above for measurements and observations.   Electronically signed by Lorine Bears MD on 07/25/2020 at 4:45:00 PM.    ASSESSMENT & PLAN:   PFO with cryptogenic CVA: planned for PFO closure on 12/10. Pre op labs and ECG done today. Full consent done during consultation with Dr. Excell Seltzer on 10/13. She will start a baby aspirin and continue on Eliquis until the day before surgery.   Chronic RLE DVT: repeat LE dopplers on 07/25/20 showed findings consistent with  chronic non-occlusive deep vein thrombosis involving the right proximal, mid, and distal femoral vein, and right popliteal vein. Findings appear improved from previous examination.  DVT/PE: has been treated with Eliquis for 6 months. [employer] will not pay for it past 6 months. Given chronic DVT will discuss with MD on if anticoagulation can be safely discontinued after 6 months. Will plan to refer to hematology to weigh in on timing.  Medication Adjustments/Labs and Tests Ordered: Current medicines are reviewed at length with the patient today.  Concerns regarding medicines are outlined above.  Medication changes, Labs and Tests ordered today are listed in the Patient Instructions below. Patient Instructions  Medication Instructions:  No changes today *If you need a refill on your cardiac medications before your next appointment, please call your pharmacy*   Lab Work: Today: cbc, bmet  If you have labs (blood work) drawn today and your tests are completely normal, you will receive your results only by: Marland Kitchen MyChart Message (if you have MyChart) OR . A paper copy in the mail If you have any lab test that is abnormal or we need to change your treatment, we will call you to review the results.   Testing/Procedures: Due to recent COVID-19 restrictions implemented by our local and state authorities and in an effort to keep both patients and staff as safe as possible, our [facility] system requires COVID-19 testing prior to certain scheduled [facility] procedures.  Please go to [phone] [facility] [place], [place] [address] on 09/04/20 at 11:10 am.  This is a drive up testing site.  You will not need to exit your vehicle.  You must agree to self-quarantine from the time of your testing until the procedure date on 09/07/20.  This should included staying home with ONLY the people you live with.  Avoid take-out, grocery store shopping or leaving the house for any non-emergent reason.  Failure to have your COVID-19  test done on the date and time you have been scheduled will result in cancellation of your procedure.  Please call our office at [phone] if you have any questions.   Follow-Up: At [facility], you and your health needs are our priority.  As part of our continuing mission to provide you with exceptional heart care, we have created designated Provider Care Teams.  These Care Teams include your primary Cardiologist (physician) and Advanced Practice Providers (APPs -  Physician Assistants and Nurse Practitioners) who all work together to provide you with the care you need, when you need it.  Your next appointment:   5 week(s)  See below.  The format for your next appointment:   In Person  Provider:   Carlean Jews, PA-C   Other Instructions See accompanying instruction letter     Signed, Cline Crock, PA-C  08/29/2020 4:43 PM    [facility] [address] [place], [place], [place]  [address] Phone: [phone]; Fax: [phone]

## 2020-08-29 ENCOUNTER — Encounter: Payer: Self-pay | Admitting: Physician Assistant

## 2020-08-29 ENCOUNTER — Ambulatory Visit: Payer: BC Managed Care – PPO | Admitting: Physician Assistant

## 2020-08-29 ENCOUNTER — Encounter: Payer: Self-pay | Admitting: *Deleted

## 2020-08-29 ENCOUNTER — Other Ambulatory Visit: Payer: Self-pay

## 2020-08-29 VITALS — BP 100/66 | HR 77 | Ht 63.0 in | Wt 172.0 lb

## 2020-08-29 DIAGNOSIS — Z01812 Encounter for preprocedural laboratory examination: Secondary | ICD-10-CM

## 2020-08-29 DIAGNOSIS — Q211 Atrial septal defect: Secondary | ICD-10-CM | POA: Diagnosis not present

## 2020-08-29 DIAGNOSIS — Q2112 Patent foramen ovale: Secondary | ICD-10-CM

## 2020-08-29 DIAGNOSIS — I824Z1 Acute embolism and thrombosis of unspecified deep veins of right distal lower extremity: Secondary | ICD-10-CM

## 2020-08-29 NOTE — Patient Instructions (Signed)
Medication Instructions:  No changes today *If you need a refill on your cardiac medications before your next appointment, please call your pharmacy*   Lab Work: Today: cbc, bmet  If you have labs (blood work) drawn today and your tests are completely normal, you will receive your results only by: Marland Kitchen MyChart Message (if you have MyChart) OR . A paper copy in the mail If you have any lab test that is abnormal or we need to change your treatment, we will call you to review the results.   Testing/Procedures: Due to recent COVID-19 restrictions implemented by our local and state authorities and in an effort to keep both patients and staff as safe as possible, our hospital system requires COVID-19 testing prior to certain scheduled hospital procedures.  Please go to [address] [facility] [place], [place] [address] on 09/04/20 at 11:10 am.  This is a drive up testing site.  You will not need to exit your vehicle.  You must agree to self-quarantine from the time of your testing until the procedure date on 09/07/20.  This should included staying home with ONLY the people you live with.  Avoid take-out, grocery store shopping or leaving the house for any non-emergent reason.  Failure to have your COVID-19 test done on the date and time you have been scheduled will result in cancellation of your procedure.  Please call our office at [phone] if you have any questions.   Follow-Up: At [facility], you and your health needs are our priority.  As part of our continuing mission to provide you with exceptional heart care, we have created designated Provider Care Teams.  These Care Teams include your primary Cardiologist (physician) and Advanced Practice Providers (APPs -  Physician Assistants and Nurse Practitioners) who all work together to provide you with the care you need, when you need it.  Your next appointment:   5 week(s)  See below.  The format for your next appointment:   In Person  Provider:    Carlean Jews, PA-C   Other Instructions See accompanying instruction letter

## 2020-08-30 ENCOUNTER — Telehealth: Payer: Self-pay

## 2020-08-30 ENCOUNTER — Ambulatory Visit: Payer: BC Managed Care – PPO | Admitting: Neurology

## 2020-08-30 DIAGNOSIS — I2699 Other pulmonary embolism without acute cor pulmonale: Secondary | ICD-10-CM

## 2020-08-30 DIAGNOSIS — D62 Acute posthemorrhagic anemia: Secondary | ICD-10-CM

## 2020-08-30 DIAGNOSIS — I824Z1 Acute embolism and thrombosis of unspecified deep veins of right distal lower extremity: Secondary | ICD-10-CM

## 2020-08-30 LAB — BASIC METABOLIC PANEL
BUN/Creatinine Ratio: 11 (ref 9–23)
BUN: 7 mg/dL (ref 6–20)
CO2: 22 mmol/L (ref 20–29)
Calcium: 9.2 mg/dL (ref 8.7–10.2)
Chloride: 108 mmol/L — ABNORMAL HIGH (ref 96–106)
Creatinine, Ser: 0.63 mg/dL (ref 0.57–1.00)
GFR calc Af Amer: 147 mL/min/{1.73_m2} (ref >59)
GFR calc non Af Amer: 128 mL/min/{1.73_m2} (ref >59)
Glucose: 80 mg/dL (ref 65–99)
Potassium: 4.5 mmol/L (ref 3.5–5.2)
Sodium: 144 mmol/L (ref 134–144)

## 2020-08-30 LAB — CBC
Hematocrit: 35.9 % (ref 34.0–46.6)
Hemoglobin: 11.6 g/dL (ref 11.1–15.9)
MCH: 28.1 pg (ref 26.6–33.0)
MCHC: 32.3 g/dL (ref 31.5–35.7)
MCV: 87 fL (ref 79–97)
Platelets: 283 10*3/uL (ref 150–450)
RBC: 4.13 x10E6/uL (ref 3.77–5.28)
RDW: 12.9 % (ref 11.7–15.4)
WBC: 3.2 10*3/uL — ABNORMAL LOW (ref 3.4–10.8)

## 2020-08-30 NOTE — Telephone Encounter (Signed)
The patient has been notified of the result and verbalized understanding.  All questions (if any) were answered. Theresia Majors, RN 08/30/2020 9:36 AM  Referral to hematology has been placed.

## 2020-08-30 NOTE — Telephone Encounter (Signed)
-----   Message from Janetta Hora, PA-C sent at 08/30/2020  7:24 AM EST ----- Can you also place a referral to hematology?

## 2020-08-31 ENCOUNTER — Telehealth: Payer: Self-pay | Admitting: Physician Assistant

## 2020-08-31 ENCOUNTER — Telehealth: Payer: Self-pay

## 2020-08-31 NOTE — Telephone Encounter (Signed)
Follow Up:     Pt said she was returning a call from yesterday. She said it was something about referring her to a specialist.

## 2020-08-31 NOTE — Telephone Encounter (Signed)
     I went in pt's chart 2 times to see who there than Carlyle called her yesterday

## 2020-08-31 NOTE — Telephone Encounter (Signed)
I placed call to patient but mailbox was full.  Unable to leave a message 

## 2020-09-03 NOTE — Telephone Encounter (Signed)
See additional documentation, patient is aware of results and referral to hematology has been placed.

## 2020-09-04 ENCOUNTER — Other Ambulatory Visit: Payer: Self-pay

## 2020-09-04 ENCOUNTER — Other Ambulatory Visit (HOSPITAL_COMMUNITY)
Admission: RE | Admit: 2020-09-04 | Discharge: 2020-09-04 | Disposition: A | Discharge status: Home / Self Care | Payer: BC Managed Care – PPO | Source: Ambulatory Visit | Attending: Cardiovascular Disease | Admitting: Cardiovascular Disease

## 2020-09-04 ENCOUNTER — Encounter: Payer: Self-pay | Admitting: Emergency Medicine

## 2020-09-04 DIAGNOSIS — Z86718 Personal history of other venous thrombosis and embolism: Secondary | ICD-10-CM | POA: Diagnosis not present

## 2020-09-04 DIAGNOSIS — Z01812 Encounter for preprocedural laboratory examination: Secondary | ICD-10-CM | POA: Insufficient documentation

## 2020-09-04 DIAGNOSIS — Z20822 Contact with and (suspected) exposure to covid-19: Secondary | ICD-10-CM | POA: Insufficient documentation

## 2020-09-04 DIAGNOSIS — Z79899 Other long term (current) drug therapy: Secondary | ICD-10-CM | POA: Diagnosis not present

## 2020-09-04 DIAGNOSIS — Z8673 Personal history of transient ischemic attack (TIA), and cerebral infarction without residual deficits: Secondary | ICD-10-CM | POA: Diagnosis not present

## 2020-09-04 DIAGNOSIS — Z7901 Long term (current) use of anticoagulants: Secondary | ICD-10-CM | POA: Diagnosis not present

## 2020-09-04 DIAGNOSIS — Q211 Atrial septal defect: Secondary | ICD-10-CM | POA: Diagnosis not present

## 2020-09-04 DIAGNOSIS — Z87891 Personal history of nicotine dependence: Secondary | ICD-10-CM | POA: Diagnosis not present

## 2020-09-04 LAB — SARS CORONAVIRUS 2 (TAT 6-24 HRS): SARS Coronavirus 2: NEGATIVE

## 2020-09-04 NOTE — Patient Outreach (Signed)
[facility]) Care Management  09/04/2020  Gail [facility] 11/22/1995 [id number]   Telephone outreach to patient to obtain mRS was successfully completed. MRS=0.  Vanice [facility] Care Management Assistant

## 2020-09-05 ENCOUNTER — Encounter: Payer: Self-pay | Admitting: Physical Medicine & Rehabilitation

## 2020-09-05 ENCOUNTER — Encounter: Payer: BC Managed Care – PPO | Attending: Registered Nurse | Admitting: Physical Medicine & Rehabilitation

## 2020-09-05 ENCOUNTER — Other Ambulatory Visit: Payer: Self-pay

## 2020-09-05 VITALS — BP 100/66 | HR 77 | Ht 63.0 in | Wt 172.0 lb

## 2020-09-05 DIAGNOSIS — I63512 Cerebral infarction due to unspecified occlusion or stenosis of left middle cerebral artery: Secondary | ICD-10-CM | POA: Insufficient documentation

## 2020-09-05 NOTE — Progress Notes (Signed)
Subjective:    Patient ID: Gail [facility], adult    DOB: [birth year], 24 y.o.   MRN: [id number]  HPI   The patient has consented to a tele-health/telephone encounter with [facility]. The patient is at home and the provider is at the office. Two separate patient identifiers were utilized to confirm the identity of the patient.    The patient talked to me on the telephone today in regards to her left MCA infarct after Covid subsequent right hemiparesis.  This is our first actually meeting since discharge.  She is going back to school this spring semester taking math and biology to start. 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 It's called "[employer] She's trying to sell off inventory she had prior to being hospitalized.  From a functional standpoint, she feels that she's back to her old self. She graduated from outpt therapy PT, OT, SLP a couple weeks ago. She sometimes struggles with concentration but had this issue prior to her hospitalization.   She has had follow up visits with cardiology and neurology.   Her balance is much improved. She's even run/jogged on occasion.  She had right hip pain initially but that has since resolved.   Pain Inventory Average Pain no pain Pain Right Now no pain My pain is no pain  In the last 24 hours, has pain interfered with the following? General activity no pain Relation with others no pain Enjoyment of life no pain What TIME of day is your pain at its worst? No pain Sleep (in general) Fair  Pain is worse with: no pain Pain improves with: no pain Relief from Meds: no pain  Family History  Problem Relation Age of Onset  . Healthy Mother   . Healthy Father   . Cancer Maternal Grandmother        renal  . Heart disease Maternal Grandfather    Social History   Socioeconomic History  . Marital status: Single    Spouse name: Not on file  . Number of children: Not on  file  . Years of education: Not on file  . Highest education level: Some college, no degree  Occupational History  . Occupation: part time working, full Neurosurgeon  Tobacco Use  . Smoking status: Former Smoker    Types: Cigarettes    Quit date: 05/19/2020    Years since quitting: 0.2  . Smokeless tobacco: Never Used  Vaping Use  . Vaping Use: Never used  Substance and Sexual Activity  . Alcohol use: Yes    Comment: OCCASIONAL   . Drug use: Yes    Types: Marijuana  . Sexual activity: Not on file  Other Topics Concern  . Not on file  Social History Narrative   Lives at home with parents   Right Handed   Drinks 1-2 cups caffeine daily   Social Determinants of Health   Financial Resource Strain:   . Difficulty of Paying Living Expenses: Not on file  Food Insecurity:   . Worried About Programme researcher, broadcasting/film/video in the Last Year: Not on file  . Ran Out of Food in the Last Year: Not on file  Transportation Needs:   . Lack of Transportation (Medical): Not on file  . Lack of Transportation (Non-Medical): Not on file  Physical Activity:   . Days of Exercise per Week: Not on file  . Minutes of Exercise per Session: Not  on file  Stress:   . Feeling of Stress : Not on file  Social Connections:   . Frequency of Communication with Friends and Family: Not on file  . Frequency of Social Gatherings with Friends and Family: Not on file  . Attends Religious Services: Not on file  . Active Member of Clubs or Organizations: Not on file  . Attends Banker Meetings: Not on file  . Marital Status: Not on file   Past Surgical History:  Procedure Laterality Date  . BUBBLE STUDY  06/06/2020   Procedure: BUBBLE STUDY;  Surgeon: Chrystie Nose, MD;  Location: [facility] ENDOSCOPY;  Service: Cardiovascular;;  . IR CT HEAD LTD  05/19/2020  . IR CT HEAD LTD  05/19/2020  . IR PERCUTANEOUS ART THROMBECTOMY/INFUSION INTRACRANIAL INC DIAG ANGIO  05/19/2020  . IR US GUIDE VASC ACCESS RIGHT  05/19/2020   . RADIOLOGY WITH ANESTHESIA N/A 05/19/2020   Procedure: IR WITH ANESTHESIA;  Surgeon: Radiologist, Medication, MD;  Location: [facility] OR;  Service: Radiology;  Laterality: N/A;  . TEE WITHOUT CARDIOVERSION N/A 06/06/2020   Procedure: TRANSESOPHAGEAL ECHOCARDIOGRAM (TEE);  Surgeon: Chrystie Nose, MD;  Location: [facility] ENDOSCOPY;  Service: Cardiovascular;  Laterality: N/A;   Past Surgical History:  Procedure Laterality Date  . BUBBLE STUDY  06/06/2020   Procedure: BUBBLE STUDY;  Surgeon: Chrystie Nose, MD;  Location: [facility] ENDOSCOPY;  Service: Cardiovascular;;  . IR CT HEAD LTD  05/19/2020  . IR CT HEAD LTD  05/19/2020  . IR PERCUTANEOUS ART THROMBECTOMY/INFUSION INTRACRANIAL INC DIAG ANGIO  05/19/2020  . IR US GUIDE VASC ACCESS RIGHT  05/19/2020  . RADIOLOGY WITH ANESTHESIA N/A 05/19/2020   Procedure: IR WITH ANESTHESIA;  Surgeon: Radiologist, Medication, MD;  Location: [facility] OR;  Service: Radiology;  Laterality: N/A;  . TEE WITHOUT CARDIOVERSION N/A 06/06/2020   Procedure: TRANSESOPHAGEAL ECHOCARDIOGRAM (TEE);  Surgeon: Chrystie Nose, MD;  Location[facility] ENDOSCOPY;  Service: Cardiovascular;  Laterality: N/A;   Past Medical History:  Diagnosis Date  . Anxiety   . Panic attack   . Stroke (HCC) 05/19/2020   BP 100/66   Pulse 77   Ht 5\' 3"  (1.6 m)   Wt 172 lb (78 kg)   BMI 30.47 kg/m   Opioid Risk Score:   Fall Risk Score:  `1  Depression screen PHQ 2/9  Depression screen PHQ 2/9 06/27/2020  Decreased Interest 1  Down, Depressed, Hopeless 0  PHQ - 2 Score 1  Altered sleeping 1  Tired, decreased energy 0  Change in appetite 0  Feeling bad or failure about yourself  1  Trouble concentrating 0  Moving slowly or fidgety/restless 0  Suicidal thoughts 0  PHQ-9 Score 3  Difficult doing work/chores Not difficult at all    Review of Systems  Constitutional: Negative.   HENT: Negative.   Eyes: Negative.   Respiratory: Negative.   Cardiovascular: Negative.   Gastrointestinal: Negative.    Endocrine: Negative.   Genitourinary: Negative.   Musculoskeletal: Negative.   Skin: Negative.   Allergic/Immunologic: Negative.   Neurological: Negative.   Hematological: Negative.   Psychiatric/Behavioral: Negative.   All other systems reviewed and are negative.      Objective:   Physical Exam  n/a      Assessment & Plan:  1.  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 HEP  -resuming school this spring. Discussed strategies to  improve organization and memory retention  -f/u with neuro/cardiology re: PFO and ultimate closure, remains on eliquis and 81 ASA 2.  Post Covid right lower extremity DVT with bilateral pulmonary emboli on Eliquis 3.  History of anemia.   15 minutes of tele-visit time was spent with this patient today. Follow up me prn. I asked her to call me with any problems or questions.

## 2020-09-06 ENCOUNTER — Telehealth: Payer: Self-pay | Admitting: *Deleted

## 2020-09-06 NOTE — Telephone Encounter (Signed)
Pt contacted pre-PFO  scheduled at [facility] for: Friday September 07, 2020 7:30 AM Verified arrival time and place: [facility] Main Entrance A [facility]) at: 5:30 AM   No solid food after midnight prior to cath, clear liquids until 5 AM day of procedure.  Hold: Eliquis -AM of procedure only  Except hold medications AM meds can be  taken pre-cath with sips of water including: ASA 81 mg   Confirmed patient has responsible adult to drive home post procedure and be with patient first 24 hours after arriving home: yes  You are allowed ONE visitor in the waiting room during the time you are at the hospital for your procedure. Both you and your visitor must wear a mask once you enter the hospital.       COVID-19 Pre-Screening Questions:  . In the past 14 days have you had any symptoms concerning for COVID-19 infection (fever, chills, cough, or new shortness of breath)? no              In the past 14 days have you been around anyone with known Covid 19? no    Reviewed procedure/mask/visitor instructions, COVID-19 questions reviewed with patient.

## 2020-09-07 ENCOUNTER — Other Ambulatory Visit: Payer: Self-pay

## 2020-09-07 ENCOUNTER — Encounter (HOSPITAL_COMMUNITY)
Admission: RE | Disposition: A | Discharge status: Home / Self Care | Payer: BC Managed Care – PPO | Source: Home / Self Care | Attending: Cardiovascular Disease

## 2020-09-07 ENCOUNTER — Ambulatory Visit (HOSPITAL_COMMUNITY)
Admission: RE | Admit: 2020-09-07 | Discharge: 2020-09-07 | Disposition: A | Discharge status: Home / Self Care | Payer: BC Managed Care – PPO | Attending: Cardiovascular Disease | Admitting: Cardiovascular Disease

## 2020-09-07 ENCOUNTER — Ambulatory Visit (HOSPITAL_BASED_OUTPATIENT_CLINIC_OR_DEPARTMENT_OTHER): Payer: BC Managed Care – PPO

## 2020-09-07 DIAGNOSIS — Q2112 Patent foramen ovale: Secondary | ICD-10-CM

## 2020-09-07 DIAGNOSIS — Q211 Atrial septal defect: Secondary | ICD-10-CM | POA: Insufficient documentation

## 2020-09-07 DIAGNOSIS — Z8673 Personal history of transient ischemic attack (TIA), and cerebral infarction without residual deficits: Secondary | ICD-10-CM | POA: Insufficient documentation

## 2020-09-07 DIAGNOSIS — Z79899 Other long term (current) drug therapy: Secondary | ICD-10-CM | POA: Insufficient documentation

## 2020-09-07 DIAGNOSIS — Z87891 Personal history of nicotine dependence: Secondary | ICD-10-CM | POA: Insufficient documentation

## 2020-09-07 DIAGNOSIS — Z20822 Contact with and (suspected) exposure to covid-19: Secondary | ICD-10-CM | POA: Insufficient documentation

## 2020-09-07 DIAGNOSIS — Z86718 Personal history of other venous thrombosis and embolism: Secondary | ICD-10-CM | POA: Insufficient documentation

## 2020-09-07 DIAGNOSIS — Z7901 Long term (current) use of anticoagulants: Secondary | ICD-10-CM | POA: Insufficient documentation

## 2020-09-07 HISTORY — PX: PATENT FORAMEN OVALE(PFO) CLOSURE: CATH118300

## 2020-09-07 LAB — ECHOCARDIOGRAM LIMITED
Area-P 1/2: 3.99 cm2
Height: 63 in
S' Lateral: 2.9 cm
Weight: 2752 oz

## 2020-09-07 LAB — PREGNANCY, URINE: Preg Test, Ur: NEGATIVE

## 2020-09-07 LAB — POCT ACTIVATED CLOTTING TIME: Activated Clotting Time: 237 seconds

## 2020-09-07 SURGERY — PATENT FORAMEN OVALE (PFO) CLOSURE
Anesthesia: LOCAL

## 2020-09-07 MED ORDER — CEFAZOLIN SODIUM-DEXTROSE 2-4 GM/100ML-% IV SOLN
INTRAVENOUS | Status: AC
  Filled 2020-09-07: qty 100

## 2020-09-07 MED ORDER — MIDAZOLAM HCL 2 MG/2ML IJ SOLN
INTRAMUSCULAR | Status: DC | PRN
  Administered 2020-09-07: 2 mg via INTRAVENOUS

## 2020-09-07 MED ORDER — LABETALOL HCL 5 MG/ML IV SOLN: 10.0000 mg | INTRAVENOUS | Status: AC | PRN

## 2020-09-07 MED ORDER — SODIUM CHLORIDE 0.9% FLUSH: 3.0000 mL | INTRAVENOUS | Status: DC | PRN

## 2020-09-07 MED ORDER — ASPIRIN 81 MG PO CHEW
81.0000 mg | CHEWABLE_TABLET | ORAL | Status: AC
  Administered 2020-09-07: 81 mg via ORAL
  Filled 2020-09-07: qty 1

## 2020-09-07 MED ORDER — SODIUM CHLORIDE 0.9 % WEIGHT BASED INFUSION: 1.0000 mL/kg/h | INTRAVENOUS | Status: DC

## 2020-09-07 MED ORDER — SODIUM CHLORIDE 0.9% FLUSH: 3.0000 mL | Freq: Two times a day (BID) | INTRAVENOUS | Status: DC

## 2020-09-07 MED ORDER — ONDANSETRON HCL 4 MG/2ML IJ SOLN: 4.0000 mg | Freq: Four times a day (QID) | INTRAMUSCULAR | Status: DC | PRN

## 2020-09-07 MED ORDER — HEPARIN (PORCINE) IN NACL 1000-0.9 UT/500ML-% IV SOLN
INTRAVENOUS | Status: AC
  Filled 2020-09-07: qty 1000

## 2020-09-07 MED ORDER — ACETAMINOPHEN 325 MG PO TABS
650.0000 mg | ORAL_TABLET | ORAL | Status: DC | PRN
  Administered 2020-09-07: 650 mg via ORAL
  Filled 2020-09-07: qty 2

## 2020-09-07 MED ORDER — HEPARIN (PORCINE) IN NACL 1000-0.9 UT/500ML-% IV SOLN
INTRAVENOUS | Status: DC | PRN
  Administered 2020-09-07 (×2): 500 mL

## 2020-09-07 MED ORDER — HEPARIN SODIUM (PORCINE) 1000 UNIT/ML IJ SOLN
INTRAMUSCULAR | Status: AC
  Filled 2020-09-07: qty 1

## 2020-09-07 MED ORDER — FENTANYL CITRATE (PF) 100 MCG/2ML IJ SOLN
INTRAMUSCULAR | Status: DC | PRN
  Administered 2020-09-07: 25 ug via INTRAVENOUS

## 2020-09-07 MED ORDER — CEFAZOLIN SODIUM-DEXTROSE 2-4 GM/100ML-% IV SOLN
2.0000 g | INTRAVENOUS | Status: AC
  Administered 2020-09-07: 2 g via INTRAVENOUS

## 2020-09-07 MED ORDER — SODIUM CHLORIDE 0.9 % IV SOLN: 250.0000 mL | INTRAVENOUS | Status: DC | PRN

## 2020-09-07 MED ORDER — LIDOCAINE HCL (PF) 1 % IJ SOLN
INTRAMUSCULAR | Status: DC | PRN
  Administered 2020-09-07: 12 mL

## 2020-09-07 MED ORDER — FENTANYL CITRATE (PF) 100 MCG/2ML IJ SOLN
INTRAMUSCULAR | Status: AC
  Filled 2020-09-07: qty 2

## 2020-09-07 MED ORDER — OXYCODONE HCL 5 MG PO TABS: 5.0000 mg | ORAL_TABLET | ORAL | Status: DC | PRN

## 2020-09-07 MED ORDER — SODIUM CHLORIDE 0.9 % WEIGHT BASED INFUSION
3.0000 mL/kg/h | INTRAVENOUS | Status: AC
  Administered 2020-09-07: 3 mL/kg/h via INTRAVENOUS

## 2020-09-07 MED ORDER — LIDOCAINE HCL (PF) 1 % IJ SOLN
INTRAMUSCULAR | Status: AC
  Filled 2020-09-07: qty 30

## 2020-09-07 MED ORDER — DIAZEPAM 5 MG PO TABS: 5.0000 mg | ORAL_TABLET | ORAL | Status: DC | PRN

## 2020-09-07 MED ORDER — ASPIRIN EC 81 MG PO TBEC: 81.0000 mg | DELAYED_RELEASE_TABLET | Freq: Every day | ORAL | Status: DC

## 2020-09-07 MED ORDER — SODIUM CHLORIDE 0.9 % WEIGHT BASED INFUSION
1.0000 mL/kg/h | INTRAVENOUS | Status: DC
  Administered 2020-09-07: 1 mL/kg/h via INTRAVENOUS

## 2020-09-07 MED ORDER — MIDAZOLAM HCL 2 MG/2ML IJ SOLN
INTRAMUSCULAR | Status: AC
  Filled 2020-09-07: qty 2

## 2020-09-07 MED ORDER — HEPARIN SODIUM (PORCINE) 1000 UNIT/ML IJ SOLN
INTRAMUSCULAR | Status: DC | PRN
  Administered 2020-09-07: 6000 [IU] via INTRAVENOUS
  Administered 2020-09-07: 2000 [IU] via INTRAVENOUS

## 2020-09-07 MED ORDER — HYDRALAZINE HCL 20 MG/ML IJ SOLN: 10.0000 mg | INTRAMUSCULAR | Status: AC | PRN

## 2020-09-07 SURGICAL SUPPLY — 21 items
BALLN SIZING AMPLATZER 24 (BALLOONS) ×2
BALLOON SIZING AMPLATZER 24 (BALLOONS) ×1 IMPLANT
CATH ACUNAV 8FR 90CM (CATHETERS) ×2 IMPLANT
CATH INFINITI 6F MPA2 100CM (CATHETERS) ×2 IMPLANT
CLOSURE PERCLOSE PROSTYLE (VASCULAR PRODUCTS) ×4 IMPLANT
COVER SWIFTLINK CONNECTOR (BAG) ×2 IMPLANT
GUIDEWIRE AMPLATZER 1.5JX260 (WIRE) ×2 IMPLANT
KIT MICROPUNCTURE NIT STIFF (SHEATH) ×2 IMPLANT
OCCLUDER AMPLATZER SEPTAL 12MM (Prosthesis & Implant Heart) ×2 IMPLANT
PACK CARDIAC CATHETERIZATION (CUSTOM PROCEDURE TRAY) ×2 IMPLANT
PROTECTION STATION PRESSURIZED (MISCELLANEOUS) ×2
SHEATH DELIVERY TALISMAN 8F 80 (SHEATH) ×1 IMPLANT
SHEATH INTROD W/O MIN 9FR 25CM (SHEATH) ×2 IMPLANT
SHEATH PINNACLE 8F 10CM (SHEATH) ×2 IMPLANT
SHEATH PINNACLE 9F 10CM (SHEATH) IMPLANT
SHEATH PROBE COVER 6X72 (BAG) ×2 IMPLANT
STATION PROTECTION PRESSURIZED (MISCELLANEOUS) ×1 IMPLANT
SYS DELIVER AMP TREVISIO 8FR (SHEATH) ×2
SYSTEM DELIVER AMP TREVIS 8FR (SHEATH) ×1 IMPLANT
TALISMAN DELIVERY SHEATH 8F 80 (SHEATH) ×2
WIRE EMERALD 3MM-J .035X150CM (WIRE) ×2 IMPLANT

## 2020-09-07 NOTE — Interval H&P Note (Signed)
History and Physical Interval Note:  09/07/2020 7:43 AM  Gail Montgomery  has presented today for surgery, with the diagnosis of PFO.  The various methods of treatment have been discussed with the patient and family. After consideration of risks, benefits and other options for treatment, the patient has consented to  Procedure(s): PATENT FORAMEN OVALE (PFO) CLOSURE (N/A) as a surgical intervention.  The patient's history has been reviewed, patient examined, no change in status, stable for surgery.  I have reviewed the patient's chart and labs.  Questions were answered to the patient's satisfaction.    Pt stable, no change in symptoms. Plan left femoral vein access with right femoral vein DVT. Last dose Eliquis last night to minimize thrombotic risk. All questions answered.   Tonny Bollman

## 2020-09-07 NOTE — Progress Notes (Signed)
  Echocardiogram 2D Echocardiogram has been performed.  Gail Montgomery 09/07/2020, 2:35 PM

## 2020-09-07 NOTE — Discharge Instructions (Signed)
You will require antibiotics prior to any dental work, including cleanings, for 6 months after your PFO closure. This is to protect the device from potentially getting infected from bacteria in your mouth entering your bloodstream. The medication will be called into your pharmacy on file. Please pick this up to have ready before any scheduled dental work. Instructions will be outlined on the bottle. The medication should be taken 1 hour prior to your dental appointment.   Groin Site Care Refer to this sheet in the next few weeks. These instructions provide you with information on caring for yourself after your procedure. Your caregiver may also give you more specific instructions. Your treatment has been planned according to current medical practices, but problems sometimes occur. Call your caregiver if you have any problems or questions after your procedure. HOME CARE INSTRUCTIONS  You may shower 24 hours after the procedure. Remove the bandage (dressing) and gently wash the site with plain soap and water. Gently pat the site dry.   Do not apply powder or lotion to the site.   Do not sit in a bathtub, swimming pool, or whirlpool for 5 to 7 days.   No bending, squatting, or lifting anything over 10 pounds (4.5 kg) for 1 week  Inspect the site at least twice daily.   Do not drive home if you are discharged the same day of the procedure. Have someone else drive you.   You may drive 24 hours after the procedure unless otherwise instructed by your caregiver.  What to expect:  Any bruising will usually fade within 1 to 2 weeks.   Blood that collects in the tissue (hematoma) may be painful to the touch. It should usually decrease in size and tenderness within 1 to 2 weeks.  SEEK IMMEDIATE MEDICAL CARE IF:  You have unusual pain at the groin site or down the affected leg.   You have redness, warmth, swelling, or pain at the groin site.   You have drainage (other than a small amount of blood  on the dressing).   You have chills.   You have a fever or persistent symptoms for more than 72 hours.   You have a fever and your symptoms suddenly get worse.   Your leg becomes pale, cool, tingly, or numb.   You have heavy bleeding from the site. Hold pressure on the site.

## 2020-09-10 ENCOUNTER — Encounter (HOSPITAL_COMMUNITY): Payer: Self-pay | Admitting: Cardiovascular Disease

## 2020-10-09 NOTE — Progress Notes (Unsigned)
[facility]   MULTIDISCIPLINARY HEART VALVE TEAM  Virtual Visit via Video Note   This visit type was conducted due to national recommendations for restrictions regarding the COVID-19 Pandemic (e.g. social distancing) in an effort to limit this patient's exposure and mitigate transmission in our community.  Due to her co-morbid illnesses, this patient is at least at moderate risk for complications without adequate follow up.  This format is felt to be most appropriate for this patient at this time.  All issues noted in this document were discussed and addressed.  A limited physical exam was performed with this format.  Please refer to the patient's chart for her consent to telehealth for [facility].  Evaluation Performed:  Follow-up visit  Date:  10/10/2020   ID:  Gail Montgomery, DOB 1996/03/01, MRN [id number]  Patient Location: Home Provider Location: Office/Clinic  PCP:  Shirlean Mylar, MD  Cardiologist:  Dr. Excell Seltzer  Electrophysiologist:  None   Chief Complaint:  1 month s/p PFO closure   History of Present Illness:    Gail Montgomery is a 25 y.o. adult with a history of Covid 19, obesity, DVT/PE, CVA and PFO s/p PFO closure (09/07/20) who presents via virtual medicine for follow up.   The patient does not have symptoms concerning for COVID-19 infection (fever, chills, cough, or new shortness of breath).   The patient was in her normal state of health when she presented with right-sided weakness and expressive aphasia that started May 19, 2020. 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 This occurred in the setting of a PFO, oral contraceptive use, and possibly Covid hypercoagulability. During her index hospitalization, she underwent transesophageal echo demonstrating a moderate PFO with bidirectional shunting on color Doppler. Transcranial Doppler study demonstrated Spencer grade 3  shunt at rest. During her hospitalization, she initially had a negative lower extremity venous duplex. However, a follow-up venous duplex demonstrated extensive thrombus in the right femoral veins. She then underwent a chest CTA demonstrating bilateral pulmonary embolus.Of note, she was fully vaccinated for Covid, but tested positive at the time of her hospital admission.Hypercoagulable panel was overall negative but ANA and mpo/pr-3 abnormal.She has been treated with Eliquis.  She was seen by Dr. Excell Seltzer on 07/11/20 for discussion about percutaneous PFO closure. He felt she would be a good candidate but wanted to reassess right LE DVT for procedural planning. Repeat LE dopplers on 07/25/20 showed findings consistent with chronic non-occlusive deep vein thrombosis involving the right proximal, mid, and distal femoral vein, and right popliteal vein. Findings appear improved from previous examination.   She underwent successful transcatheter PFO closure using a 12 mm Amplatzer atrial septal occluder device on 09/07/20. Post op limited echo showed normal LV function and device placement with no residual atrial level shunt. She was discharged on Eliquis 5mg  BID and aspirin 81mg  daily x 3 months. Plans were to discuss long term Eliquis use with hematology.  Today she presents for follow up via virtual medicine. She is doing great and back in school and applied for a swim coach job. No CP or SOB. No LE edema, orthopnea or PND. No dizziness or syncope. No blood in stool or urine. No palpitations.    Past Medical History:  Diagnosis Date  . Anxiety   . Panic attack   . Stroke [facility]) 05/19/2020   Past Surgical History:  Procedure Laterality Date  . BUBBLE STUDY  06/06/2020  Procedure: BUBBLE STUDY;  Surgeon: Chrystie Nose, MD;  Location: [facility] ENDOSCOPY;  Service: Cardiovascular;;  . IR CT HEAD LTD  05/19/2020  . IR CT HEAD LTD  05/19/2020  . IR PERCUTANEOUS ART THROMBECTOMY/INFUSION INTRACRANIAL INC DIAG  ANGIO  05/19/2020  . IR US GUIDE VASC ACCESS RIGHT  05/19/2020  . PATENT FORAMEN OVALE(PFO) CLOSURE N/A 09/07/2020   Procedure: PATENT FORAMEN OVALE (PFO) CLOSURE;  Surgeon: Tonny Bollman, MD;  Location: [facility] INVASIVE CV LAB;  Service: Cardiovascular;  Laterality: N/A;  . RADIOLOGY WITH ANESTHESIA N/A 05/19/2020   Procedure: IR WITH ANESTHESIA;  Surgeon: Radiologist, Medication, MD;  Location: [facility] OR;  Service: Radiology;  Laterality: N/A;  . TEE WITHOUT CARDIOVERSION N/A 06/06/2020   Procedure: TRANSESOPHAGEAL ECHOCARDIOGRAM (TEE);  Surgeon: Chrystie Nose, MD;  Location: [facility] ENDOSCOPY;  Service: Cardiovascular;  Laterality: N/A;     Current Meds  Medication Sig  . acetaminophen (TYLENOL) 500 MG tablet Take 500 mg by mouth every 6 (six) hours as needed for headache.  Marland Kitchen apixaban (ELIQUIS) 5 MG TABS tablet Take 1 tablet (5 mg total) by mouth 2 (two) times daily.  Marland Kitchen aspirin EC 81 MG tablet Take 1 tablet (81 mg total) by mouth daily. Swallow whole.  Marland Kitchen atorvastatin (LIPITOR) 20 MG tablet Take 1 tablet (20 mg total) by mouth daily.  . folic acid (FOLVITE) 1 MG tablet Take 1 tablet (1 mg total) by mouth daily.  . Multiple Vitamin (MULTIVITAMIN) tablet Take 1 tablet by mouth daily.  . pantoprazole (PROTONIX) 40 MG tablet Take 1 tablet (40 mg total) by mouth daily.  . potassium chloride (KLOR-CON) 20 MEQ packet Take 20 mEq by mouth daily.     Allergies:   Other   Social History   Tobacco Use  . Smoking status: Former Smoker    Types: Cigarettes    Quit date: 05/19/2020    Years since quitting: 0.3  . Smokeless tobacco: Never Used  Vaping Use  . Vaping Use: Never used  Substance Use Topics  . Alcohol use: Yes    Comment: OCCASIONAL   . Drug use: Yes    Types: Marijuana     Family Hx: The patient's family history includes Cancer in her maternal grandmother; Healthy in her father and mother; Heart disease in her maternal grandfather.  ROS:   Please see the history of present illness.     All other systems reviewed and are negative.   Prior CV studies:   The following studies were reviewed today:  09/07/20 PATENT FORAMEN OVALE (PFO) CLOSURE   Conclusion  Successful transcatheter PFO closure using fluoroscopic and intracardiac echo guidance, using a 12 mm Amplatzer atrial septal occluder device  Recommendations:  Resume apixaban this evening at normal dosing schedule  Take aspirin for 3 months  We will reach out to hematology to discuss the option of aspirin and clopidogrel now that she is out 6 months from her pulmonary embolism event  SBE prophylaxis x6 months  Discharge home today as long as postprocedural echo shows no early apparent complications.  _____________________  Limited echo 09/07/20 IMPRESSIONS  1. Left ventricular ejection fraction, by estimation, is 60 to 65%. The  left ventricle has normal function. The left ventricle has no regional  wall motion abnormalities. Left ventricular diastolic parameters were  normal.  2. Right ventricular systolic function is normal. The right ventricular  size is normal. There is normal pulmonary artery systolic pressure. The  estimated right ventricular systolic pressure is 21.0 mmHg.  3.  PFO closure device intact, no residual shunt.  4. The mitral valve is normal in structure. No evidence of mitral valve  regurgitation. No evidence of mitral stenosis.  5. The aortic valve is normal in structure. Aortic valve regurgitation is  not visualized. No aortic stenosis is present.  6. The inferior vena cava is normal in size with greater than 50%  respiratory variability, suggesting right atrial pressure of 3 mmHg.   Labs/Other Tests and Data Reviewed:    EKG:  No ECG reviewed.  Recent Labs: 05/22/2020: TSH 0.050 06/04/2020: B Natriuretic Peptide 21.4; Magnesium 1.9 06/08/2020: ALT 43 08/29/2020: BUN 7; Creatinine, Ser 0.63; Hemoglobin 11.6; Platelets 283; Potassium 4.5; Sodium 144   Recent Lipid  Panel Lab Results  Component Value Date/Time   CHOL 156 05/20/2020 09:58 AM   TRIG 105 05/20/2020 09:58 AM   TRIG 104 05/20/2020 09:58 AM   HDL 57 05/20/2020 09:58 AM   CHOLHDL 2.7 05/20/2020 09:58 AM   LDLCALC 78 05/20/2020 09:58 AM    Wt Readings from Last 3 Encounters:  10/10/20 170 lb (77.1 kg)  09/07/20 172 lb (78 kg)  09/05/20 172 lb (78 kg)     Objective:    Vital Signs:  BP 129/84   Pulse (!) 113   Ht 5\' 3"  (1.6 m)   Wt 170 lb (77.1 kg)   BMI 30.11 kg/m    Well nourished, well developed adult in no acute distress.   ASSESSMENT & PLAN:    PFO s/p percutaneous PFO closure: doing excellent. She will continue on aspirin and Eliquis for now.  Discussed stopping Eliquis and starting aspirin + plavix x 6 month followed by aspirin alone vs heme consult for opinion regarding long term OAC, and she chose seeing hematology. She was previously referred but declined visit at the time. Once we have their final recommendation, I will adjust her medications. She understands the need for SBE prophlaxis x 41months. I will see her back in 1 year with echo with bubble.   Chronic RLE DVT: repeat LE dopplers on 07/25/20 showed findings consistent with chronic non-occlusive deep vein thrombosis involving the right proximal, mid, and distal femoral vein, and right popliteal vein. Findings appear improved from previous examination. She is still on Eliquis.   DVT/PE: provoked in the setting of Covid and oral contraceptive use.  Hypercoagulable panel showed + ANA and mpo/pr-3 . She completed 6 months of OAC with Eliquis, but repeat LE dopplers showed chronic DVT. Will refer to heme to see if they are comfortable with her stopping Eliquis, as above.   ADDENDUM: after the visit when my nurse was giving the pt her instructions, she revealed that she thinks she is ~ [redacted] weeks pregnant but is now bleeding. She does not want this baby and does not want her mother to know. She is considering taking  misoprostol. She plans to take another pregnancy test tomorrow. She did not share of this information with me. I was able to get a hold of her after several attempts to reach her. She continues to have cramping and bleeding. She thinks she may be miscarrying. [facility] She will let me know immediately if something changes and she remains pregnant and wants to keep the baby. She revealed that she and her boyfriend were researching ways to "take care of it by herself." I strongly urged her to terminate under the care of a medical doctor, especially since she is on strong blood  thinning medications. Also, I discussed establishing care with a gynecologist to discuss long term birth control methods that are safe given her history of blood clots.    COVID-19 Education: The signs and symptoms of COVID-19 were discussed with the patient and how to seek care for testing (follow up with PCP or arrange E-visit).  The importance of social distancing was discussed today.  Time:   Today, I have spent 50 minutes with the patient with telehealth technology discussing the above problems.  Total time spent with patient was over 40 minutes which included evaluating patient, reviewing record and coordinating care. Face to face time >50%.     Medication Adjustments/Labs and Tests Ordered: Current medicines are reviewed at length with the patient today.  Concerns regarding medicines are outlined above.   Tests Ordered: Orders Placed This Encounter  Procedures  . Ambulatory referral to Hematology    Medication Changes: Meds ordered this encounter  Medications  . aspirin EC 81 MG tablet    Sig: Take 1 tablet (81 mg total) by mouth daily. Swallow whole.    Dispense:  90 tablet    Refill:  3     Disposition:  Follow up in 1 year(s)  Signed, Cline CrockKathryn Gwenette Wellons, PA-C  10/10/2020 1:59 PM    [facility]

## 2020-10-10 ENCOUNTER — Ambulatory Visit: Payer: BC Managed Care – PPO | Admitting: Physician Assistant

## 2020-10-10 ENCOUNTER — Encounter: Payer: Self-pay | Admitting: Physician Assistant

## 2020-10-10 ENCOUNTER — Telehealth (INDEPENDENT_AMBULATORY_CARE_PROVIDER_SITE_OTHER): Payer: BC Managed Care – PPO | Admitting: Physician Assistant

## 2020-10-10 ENCOUNTER — Other Ambulatory Visit: Payer: Self-pay

## 2020-10-10 VITALS — BP 129/84 | HR 113 | Ht 63.0 in | Wt 170.0 lb

## 2020-10-10 DIAGNOSIS — Z3A01 Less than 8 weeks gestation of pregnancy: Secondary | ICD-10-CM | POA: Diagnosis not present

## 2020-10-10 DIAGNOSIS — Z8774 Personal history of (corrected) congenital malformations of heart and circulatory system: Secondary | ICD-10-CM

## 2020-10-10 DIAGNOSIS — I824Y9 Acute embolism and thrombosis of unspecified deep veins of unspecified proximal lower extremity: Secondary | ICD-10-CM

## 2020-10-10 DIAGNOSIS — Z86711 Personal history of pulmonary embolism: Secondary | ICD-10-CM | POA: Diagnosis not present

## 2020-10-10 MED ORDER — ASPIRIN EC 81 MG PO TBEC: 81.0000 mg | DELAYED_RELEASE_TABLET | Freq: Every day | ORAL | 3 refills | Status: AC

## 2020-10-10 NOTE — Patient Instructions (Signed)
Medication Instructions:  No changes.  Continue aspirin and Eliquis and after you see the hematologist, please call or MyChart Gail Montgomery to let her know.  *If you need a refill on your cardiac medications before your next appointment, please call your pharmacy*   Lab Work: none If you have labs (blood work) drawn today and your tests are completely normal, you will receive your results only by: Marland Kitchen MyChart Message (if you have MyChart) OR . A paper copy in the mail If you have any lab test that is abnormal or we need to change your treatment, we will call you to review the results.   Testing/Procedures: Your physician has requested that you have an echocardiogram. Echocardiography is a painless test that uses sound waves to create images of your heart. It provides your doctor with information about the size and shape of your heart and how well your heart's chambers and valves are working. This procedure takes approximately one hour. There are no restrictions for this procedure.     Follow-Up: ECHO WITH BUBBLE STUDY DUE December 2022, SAME DAY AS NEXT APPOINTMENT WITH K. THOMPSON. At [facility], you and your health needs are our priority.  As part of our continuing mission to provide you with exceptional heart care, we have created designated Provider Care Teams.  These Care Teams include your primary Cardiologist (physician) and Advanced Practice Providers (APPs -  Physician Assistants and Nurse Practitioners) who all work together to provide you with the care you need, when you need it.   Your next appointment:   11 month(s)  The format for your next appointment:   In Person  Provider:   Carlean Jews, PA-C   Other Instructions Echo same day as next follow up with Gail Jews, PA-C.

## 2020-10-10 NOTE — Addendum Note (Signed)
Addended by: Janetta Hora on: 10/10/2020 03:02 PM   Modules accepted: Level of Service

## 2020-10-31 ENCOUNTER — Ambulatory Visit: Payer: BC Managed Care – PPO | Admitting: Physician Assistant

## 2020-11-01 ENCOUNTER — Telehealth: Payer: Self-pay | Admitting: Neurology

## 2020-11-02 ENCOUNTER — Other Ambulatory Visit: Payer: Self-pay

## 2020-11-02 ENCOUNTER — Inpatient Hospital Stay (HOSPITAL_COMMUNITY): Payer: BC Managed Care – PPO

## 2020-11-02 ENCOUNTER — Inpatient Hospital Stay (HOSPITAL_COMMUNITY): Payer: BC Managed Care – PPO | Attending: Hematology and Oncology | Admitting: Hematology and Oncology

## 2020-11-02 ENCOUNTER — Encounter (HOSPITAL_COMMUNITY): Payer: Self-pay | Admitting: Hematology and Oncology

## 2020-11-02 VITALS — BP 125/76 | HR 94 | Temp 99.0°F | Resp 17 | Ht 63.0 in | Wt 176.0 lb

## 2020-11-02 DIAGNOSIS — Z8059 Family history of malignant neoplasm of other urinary tract organ: Secondary | ICD-10-CM | POA: Insufficient documentation

## 2020-11-02 DIAGNOSIS — Z793 Long term (current) use of hormonal contraceptives: Secondary | ICD-10-CM | POA: Diagnosis not present

## 2020-11-02 DIAGNOSIS — Z8616 Personal history of COVID-19: Secondary | ICD-10-CM | POA: Insufficient documentation

## 2020-11-02 DIAGNOSIS — Z86718 Personal history of other venous thrombosis and embolism: Secondary | ICD-10-CM | POA: Diagnosis not present

## 2020-11-02 DIAGNOSIS — I82401 Acute embolism and thrombosis of unspecified deep veins of right lower extremity: Secondary | ICD-10-CM

## 2020-11-02 DIAGNOSIS — I2699 Other pulmonary embolism without acute cor pulmonale: Secondary | ICD-10-CM

## 2020-11-02 DIAGNOSIS — Z87891 Personal history of nicotine dependence: Secondary | ICD-10-CM | POA: Diagnosis not present

## 2020-11-02 DIAGNOSIS — Z8673 Personal history of transient ischemic attack (TIA), and cerebral infarction without residual deficits: Secondary | ICD-10-CM | POA: Diagnosis not present

## 2020-11-02 NOTE — Progress Notes (Signed)
[facility] CONSULT NOTE  Patient Care Team: Shirlean Mylar, MD as PCP - General (Family Medicine)  CHIEF COMPLAINTS/PURPOSE OF CONSULTATION:  DVT, PE and acute embolic stroke, had patent PFO in setting of COVID  ASSESSMENT & PLAN:  No problem-specific Assessment & Plan notes found for this encounter.  Orders Placed This Encounter  Procedures  . Lupus anticoagulant panel    Standing Status:   Future    Standing Expiration Date:   11/02/2021  . Prothrombin gene mutation    Standing Status:   Future    Standing Expiration Date:   11/02/2021  . Factor 5 leiden    Standing Status:   Future    Standing Expiration Date:   11/02/2021  . Hexagonal Phospholipid Neutralization    Standing Status:   Future    Standing Expiration Date:   11/02/2021  . Cardiolipin antibodies, IgG, IgM, IgA    Standing Status:   Future    Standing Expiration Date:   11/02/2021  . Antithrombin III    Standing Status:   Future    Standing Expiration Date:   11/02/2021  . Protein C activity    Standing Status:   Future    Standing Expiration Date:   11/02/2021  . Protein S activity    Standing Status:   Future    Standing Expiration Date:   11/02/2021  . 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 She was also diagnosed with Covid at the time of this admission despite to complete vaccination. She has recovered almost completely from her stroke and has no residual leg swelling or chest complaints.  She is wondering if she needs to take this Eliquis  for a long time. Physical examination today quite unremarkable, no palpable lymphadenopathy, hepatosplenomegaly, lower extremity swelling. I reviewed her labs from the past, negative a PLS work-up but otherwise no other hypercoagulable work-up performed.  Given young age at the time of the clot despite the concern that it is possibly provoked from concomitant COVID-19 infection, oral contraceptive use, I think it is reasonable to proceed with hypercoagulable work-up.  I have advised that she continue Eliquis until she comes back for follow-up to discuss hypercoagulable work-up results.  If hypercoagulable work-up completely negative and no concern for underlying clotting disorder, then she can complete 6 months of anticoagulation and then transition to baby aspirin daily.  She still has a higher risk of reclotting compared to general population but in the provoked setting it is about 1% for the first year and then half percent from the following years.  She should go to the nearest hospital with any concerns for another DVT/PE in the future.  She understands the risk of bleeding while being on a blood thinner.  I would not recommend pregnancy while on novel anticoagulants.  She should not use any form of estrogen-based contraception in the future.  She should come back to see hematology if she was to be pregnant. Thank you for consulting [place] in the care of this patient.  Please do not  hesitate to contact us with any additional questions or concerns.  She should not  HISTORY OF PRESENTING ILLNESS:  Gail Montgomery 25 y.o. adult is here because of acute DVT of distal vein of RLE and   This is a 25 year old female patient with history of anxiety, recreational marijuana use, oral contraceptive use admitted in August 2021 with sudden onset right-sided weakness in the speaking.  She was fully vaccinated a month prior to admission but was Covid positive.  CT head at that time showed dense left MCA on CTA head and  neck showed emergent LVO at the left ICA terminus continuing in the left MCA which he underwent cerebral angiogram with thrombectomy of left.  Postprocedure she had large subarachnoid hemorrhage with ventricular extension and she received TXA and cryoprecipitate to stop bleeding.  MRI brain showed early subacute infarct in the left basal ganglia hemorrhage along the interhemispheric fissure to the lateral ventricles with early communicating hydrocephalus.    Repeat Doppler showed acute DVT in right femoral, popliteal and gastrocnemius veins. CTA chest showed bilateral PE and she was started on heparin and transition to Eliquis on 09/03. E. coli UTI treated with course of ceftriaxone. 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 Plans are for PFO closure in the future  She then underwent rehabilitation and is now referred to hematology for additional recommendations. APLS work up in the past was negative She is here for an initial visit, doing very well. She denies any new complaints.  She has recovered almost completely from the event.  She had her PFO closed.  She is taking Eliquis, no complications.  She occasionally had this outburst of words which is getting better.  No history of thromboembolic events prior to the episode in August 2021.  No known family history of hypercoagulable disorders although she remembers her mom's dad might have died at the age of 14 from heart disease.  She recently had a miscarriage about 2-1/2 weeks ago.  No other pregnancies.  Rest of the pertinent 10 point ROS reviewed and negative.  REVIEW OF SYSTEMS:   Constitutional: Denies fevers, chills or abnormal night sweats Eyes: Denies blurriness of vision, double vision or watery eyes Ears, nose, mouth, throat, and face: Denies mucositis or sore throat Respiratory: Denies cough, dyspnea or wheezes Cardiovascular: Denies palpitation, chest  discomfort or lower extremity swelling Gastrointestinal:  Denies nausea, heartburn or change in bowel habits Skin: Denies abnormal skin rashes Lymphatics: Denies new lymphadenopathy or easy bruising Neurological:Denies numbness, tingling or new weaknesses Behavioral/Psych: Mood is stable, no new changes  All other systems were reviewed with the patient and are negative.  MEDICAL HISTORY:  Past Medical History:  Diagnosis Date  . Anxiety   . Panic attack   . Stroke [facility]) 05/19/2020    SURGICAL HISTORY: Past Surgical History:  Procedure Laterality Date  . BUBBLE STUDY  06/06/2020   Procedure: BUBBLE STUDY;  Surgeon: Chrystie Nose, MD;  Location: [facility] ENDOSCOPY;  Service: Cardiovascular;;  . IR CT HEAD LTD  05/19/2020  . IR CT HEAD LTD  05/19/2020  . IR PERCUTANEOUS ART THROMBECTOMY/INFUSION INTRACRANIAL INC DIAG ANGIO  05/19/2020  . IR US GUIDE VASC ACCESS RIGHT  05/19/2020  . PATENT FORAMEN OVALE(PFO) CLOSURE N/A 09/07/2020   Procedure: PATENT FORAMEN OVALE (PFO) CLOSURE;  Surgeon: Tonny Bollman, MD;  Location: [facility] INVASIVE CV LAB;  Service: Cardiovascular;  Laterality: N/A;  . RADIOLOGY WITH ANESTHESIA N/A 05/19/2020  Procedure: IR WITH ANESTHESIA;  Surgeon: Radiologist, Medication, MD;  Location: [facility] OR;  Service: Radiology;  Laterality: N/A;  . TEE WITHOUT CARDIOVERSION N/A 06/06/2020   Procedure: TRANSESOPHAGEAL ECHOCARDIOGRAM (TEE);  Surgeon: Chrystie Nose, MD;  Location: [facility] ENDOSCOPY;  Service: Cardiovascular;  Laterality: N/A;    SOCIAL HISTORY: Social History   Socioeconomic History  . Marital status: Single    Spouse name: Not on file  . Number of children: Not on file  . Years of education: Not on file  . Highest education level: Some college, no degree  Occupational History  . Occupation: part time working, full Neurosurgeon  Tobacco Use  . Smoking status: Former Smoker    Types: Cigarettes    Quit date: 05/19/2020    Years since quitting: 0.4  . Smokeless  tobacco: Never Used  Vaping Use  . Vaping Use: Never used  Substance and Sexual Activity  . Alcohol use: Yes    Comment: OCCASIONAL   . Drug use: Yes    Types: Marijuana  . Sexual activity: Not on file  Other Topics Concern  . Not on file  Social History Narrative   Lives at home with parents   Right Handed   Drinks 1-2 cups caffeine daily   Social Determinants of Health   Financial Resource Strain: Not on file  Food Insecurity: Not on file  Transportation Needs: No Transportation Needs  . Lack of Transportation (Medical): No  . Lack of Transportation (Non-Medical): No  Physical Activity: Inactive  . Days of Exercise per Week: 0 days  . Minutes of Exercise per Session: 0 min  Stress: Not on file  Social Connections: Not on file  Intimate Partner Violence: Not At Risk  . Fear of Current or Ex-Partner: No  . Emotionally Abused: No  . Physically Abused: No  . Sexually Abused: No    FAMILY HISTORY: Family History  Problem Relation Age of Onset  . Healthy Mother   . Healthy Father   . Cancer Maternal Grandmother        renal  . Heart disease Maternal Grandfather     ALLERGIES:  is allergic to other.  MEDICATIONS:  Current Outpatient Medications  Medication Sig Dispense Refill  . acetaminophen (TYLENOL) 500 MG tablet Take 500 mg by mouth every 6 (six) hours as needed for headache.    Marland Kitchen apixaban (ELIQUIS) 5 MG TABS tablet Take 1 tablet (5 mg total) by mouth 2 (two) times daily. 60 tablet 0  . aspirin EC 81 MG tablet Take 1 tablet (81 mg total) by mouth daily. Swallow whole. 90 tablet 3  . atorvastatin (LIPITOR) 20 MG tablet Take 1 tablet (20 mg total) by mouth daily. 30 tablet 5  . folic acid (FOLVITE) 1 MG tablet Take 1 tablet (1 mg total) by mouth daily. 30 tablet 5  . Multiple Vitamin (MULTIVITAMIN) tablet Take 1 tablet by mouth daily. 30 tablet 5  . pantoprazole (PROTONIX) 40 MG tablet Take 1 tablet (40 mg total) by mouth daily. 30 tablet 5  . potassium chloride  (KLOR-CON) 20 MEQ packet Take 20 mEq by mouth daily. 30 packet 5   No current facility-administered medications for this visit.     PHYSICAL EXAMINATION: ECOG PERFORMANCE STATUS: 0 - Asymptomatic  Vitals:   11/02/20 1148  BP: 125/76  Pulse: 94  Resp: 17  Temp: 99 F (37.2 C)  SpO2: 100%   Filed Weights   11/02/20 1148  Weight: 176 lb (79.8 kg)  GENERAL:alert, no distress and comfortable SKIN: skin color, texture, turgor are normal, no rashes or significant lesions EYES: normal, conjunctiva are pink and non-injected, sclera clear OROPHARYNX:no exudate, no erythema and lips, buccal mucosa, and tongue normal  NECK: supple, thyroid normal size, non-tender, without nodularity LYMPH:  no palpable lymphadenopathy in the cervical, axillary or inguinal LUNGS: clear to auscultation and percussion with normal breathing effort HEART: regular rate & rhythm and no murmurs and no lower extremity edema ABDOMEN:abdomen soft, non-tender and normal bowel sounds Musculoskeletal:no cyanosis of digits and no clubbing  PSYCH: alert & oriented x 3 with fluent speech NEURO: no focal motor/sensory deficits  LABORATORY DATA:  I have reviewed the data as listed Lab Results  Component Value Date   WBC 3.2 (L) 08/29/2020   HGB 11.6 08/29/2020   HCT 35.9 08/29/2020   MCV 87 08/29/2020   PLT 283 08/29/2020     Chemistry      Component Value Date/Time   NA 144 08/29/2020 1429   K 4.5 08/29/2020 1429   CL 108 (H) 08/29/2020 1429   CO2 22 08/29/2020 1429   BUN 7 08/29/2020 1429   CREATININE 0.63 08/29/2020 1429      Component Value Date/Time   CALCIUM 9.2 08/29/2020 1429   ALKPHOS 62 06/08/2020 1444   AST 39 06/08/2020 1444   ALT 43 06/08/2020 1444   BILITOT 0.4 06/08/2020 1444      IMPRESSION: 1. Bilateral segmental LOWER lobe pulmonary emboli, LEFT-greater-than-RIGHT. Equivocal segmental emboli within the LEFT UPPER lobe. 2. Mild cardiomegaly.  I have reviewed all pertinent  medical records from the August admission.  RADIOGRAPHIC STUDIES: I have personally reviewed the radiological images as listed and agreed with the findings in the report. No results found.  All questions were answered. The patient knows to call the clinic with any problems, questions or concerns. I spent 45 minutes in the care of this patient including H and P, review of records, counseling and coordination of care.     Rachel Moulds, MD 11/02/2020 12:25 PM

## 2020-11-03 LAB — DRVVT MIX: dRVVT Mix: 46.1 s — ABNORMAL HIGH (ref 0.0–40.4)

## 2020-11-03 LAB — LUPUS ANTICOAGULANT PANEL
DRVVT: 59.5 s — ABNORMAL HIGH (ref 0.0–47.0)
PTT Lupus Anticoagulant: 37.4 s (ref 0.0–51.9)

## 2020-11-03 LAB — PROTEIN C ACTIVITY: Protein C Activity: 117 % (ref 73–180)

## 2020-11-03 LAB — CARDIOLIPIN ANTIBODIES, IGG, IGM, IGA
Anticardiolipin IgA: <9 APL U/mL (ref 0–11)
Anticardiolipin IgG: <9 GPL U/mL (ref 0–14)
Anticardiolipin IgM: <9 MPL U/mL (ref 0–12)

## 2020-11-03 LAB — DRVVT CONFIRM: dRVVT Confirm: 1.1 ratio (ref 0.8–1.2)

## 2020-11-03 LAB — PROTEIN S ACTIVITY: Protein S Activity: 57 % — ABNORMAL LOW (ref 63–140)

## 2020-11-04 LAB — BETA-2-GLYCOPROTEIN I ABS, IGG/M/A
Beta-2 Glyco I IgG: <9 GPI IgG units (ref 0–20)
Beta-2-Glycoprotein I IgA: <9 GPI IgA units (ref 0–25)
Beta-2-Glycoprotein I IgM: <9 GPI IgM units (ref 0–32)

## 2020-11-05 ENCOUNTER — Telehealth (INDEPENDENT_AMBULATORY_CARE_PROVIDER_SITE_OTHER): Payer: BC Managed Care – PPO | Admitting: Neurology

## 2020-11-05 DIAGNOSIS — Q2112 Patent foramen ovale: Secondary | ICD-10-CM

## 2020-11-05 DIAGNOSIS — Q211 Atrial septal defect: Secondary | ICD-10-CM | POA: Diagnosis not present

## 2020-11-05 MED ORDER — APIXABAN 5 MG PO TABS: 5.0000 mg | ORAL_TABLET | Freq: Two times a day (BID) | ORAL | 3 refills | Status: DC

## 2020-11-05 NOTE — Telephone Encounter (Signed)
eliquis refill sent to [facility]

## 2020-11-05 NOTE — Progress Notes (Signed)
Virtual Visit via Video Note  I connected with Gail Montgomery on 11/05/20 at  2:30 PM EST by a video enabled telemedicine application and verified that I am speaking with the correct person using two identifiers.  Location: Patient: At her home Provider: [facility] office Referring physician :Marvel Plan   I discussed the limitations of evaluation and management by telemedicine and the availability of in person appointments. The patient expressed understanding and agreed to proceed.  History of Present Illness: Ms. Gail Montgomery is a 25 year old African-American lady seen today for initial virtual video consult for stroke.  History is obtained from her and review of electronic medical records and I personally reviewed imaging films in PACS.  She has no significant past medical history except being on hormonal birth control pills.  She presented on 05/19/2020 2 [facility] with sudden onset of aphasia and right hemiparesis.  Code stroke was called.  Emergent CT scan showed a dense left MCA sign and CT angiogram confirmed distal left ICA occlusion extending into the left MCA and ACA.  She was taken for emergent thrombectomy which was successfully performed she was felt to have significant stenosis of her carotid artery which could represent possible dissection.  She was loaded with aspirin and Plavix with plans for emergent stent deployment however the vessel was found to be widely patent and hence no stent was deployed.  Postoperative CT scan showed a large subarachnoid hemorrhage with extension into the ventricles and lateral ventricle and fourth ventricle.  She was kept in the neurological intensive care unit and had close neurological monitoring and did not require any neurosurgical intervention.  Blood pressure is adequately controlled.  MRI scan of the brain showed subarachnoid hemorrhage in the anterior interhemispheric fissure and left temporal lobe.  Infarct was noted in the left basal ganglia and posterior  putamen area.  She had multiple follow-up scans which showed stable appearance without increase in hemorrhage or mass-effect.  2D echo showed normal ejection fraction.  TEE showed patent foramen ovale without cardiac source of embolism.  Transcranial Doppler bubble study showed Spencer grade 3 right to left shunt at rest and she was not cooperative for Valsalva.  LDL cholesterol 78 mg percent hemoglobin A1c was 5.6.  Urine drug screen was positive for cannabis.  Covid test was positive.  Lower extremity venous Dopplers were positive for deep vein thrombosis in the left lower extremity and CT angiogram of the chest showed bilateral segmental lower lobe pulmonary emboli left greater than right with equivocal segmental emboli within the left upper lobe.  Patient was started on Eliquis for anticoagulation at discharge.  She did well and had only mild right hemiparesis and was discharged to inpatient rehab and did well and was discharged home in 2 weeks and she is presently at home.  She was ambulating well at discharge with some dragging of the right toe and mild difficulties with verbal expression and comprehension.  She states she is done well now and her speech and comprehension have improved and she is walking independently.  She is undergoing elective PFO closure by Dr. Excell Seltzer in December 2021 which went well.  She states she is done well she has mildly diminished fine motor skills in the hand and occasional short-term memory difficulties but otherwise has done well.  She wanted a letter for accommodations for her college and she is done well with that and keeping up with her courses.  She remains on Eliquis which is tolerating well without bleeding or bruising.  She is seen by hematologist Dr.Iruku last week who has obtained lab work for hypercoagulable panel and will make a decision about duration of anticoagulation after reviewing the results as patient had a provoked DVT and pulmonary emboli in the setting of  hypercoagulability from acute Covid infection.    Observations/Objective: Physical and neurological exam is limited due to constraints from a virtual video visit.  Pleasant young African-American lady not in distress.  She is awake alert oriented to time place and person.  Speech and language appear normal without significant word hesitation, paraphasic errors.  Comprehension appears intact.  She has diminished recall 2/3.  She is able to name 11 animals which can walk on 4 legs.  She was able to copy intersecting pentagons.  Clock drawing was 4/4.  Speech was clear without dysarthria.  Face is symmetric without weakness.  Extraocular movements appear full range without nystagmus.  Visual fields cannot be tested.  Tongue is midline.  Motor system exam shows symmetric upper and lower extremity strength without drift.  Slight diminished fine finger movements on the right.  Orbits left over right upper extremity.  Symmetric lower extremity strength and walks with good balance. 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  She will continue Eliquis for now but follow-up with a hematologist to consider discontinuing it and changing it to aspirin when all the results of her hypercoagulable labs come back.  Recommend check transcranial Doppler bubble study to look for adequacy of her recent PFO closure.  Continue aggressive risk factor modification with strict control of hypertension and blood pressure goal below 130/90, lipids with LDL cholesterol goal below 70 mg percent and diabetes with hemoglobin A1c  goal below 6.5%.  She will return for follow-up in the future in 6 months or call earlier if necessary.   I discussed the assessment and treatment plan with the patient. The patient was provided an opportunity to ask questions and all were answered. The patient agreed with the plan and demonstrated an understanding of the instructions.   The patient was advised to call back or seek an in-person evaluation if the symptoms worsen or if the condition fails to improve as anticipated.  I provided 45 minutes of non-face-to-face time during this encounter discussion about her embolic stroke, carotid occlusion, COVID related hypercoagulability and answered questions about her care.Delia Heady, MD

## 2020-11-05 NOTE — Patient Instructions (Signed)
I had a long discussion with the patient video call regarding her previous stroke and answered questions.  She will continue Eliquis for now but follow-up with a hematologist to consider discontinuing it and changing it to aspirin when all the results of her hypercoagulable labs come back.  Recommend check transcranial Doppler bubble study to look for adequacy of her recent PFO closure.  Continue aggressive risk factor modification with strict control of hypertension and blood pressure goal below 130/90, lipids with LDL cholesterol goal below 70 mg percent and diabetes with hemoglobin A1c goal below 6.5%.  She will return for follow-up in the future in 6 months or call earlier if necessary.

## 2020-11-05 NOTE — Telephone Encounter (Signed)
Per Dennis Bast, RN and Cline Crock, PA, to leave pt 1 box of Eliquis 5 mg tablet samples at the front desk for pt to pick up. Lot# [id number]  EXP: 12/2022

## 2020-11-07 ENCOUNTER — Telehealth: Payer: Self-pay | Admitting: Cardiovascular Disease

## 2020-11-07 NOTE — Telephone Encounter (Signed)
Called pt to inform her that samples of Eliquis were already at the front desk for her to pick up. I advised pt that if she has any other problems, questions or concerns, to give our office a call back. Pt verbalized understanding.

## 2020-11-07 NOTE — Telephone Encounter (Signed)
Patient calling the office for samples of medication:   1.  What medication and dosage are you requesting samples for? apixaban (ELIQUIS) 5 MG TABS tablet   2.  Are you currently out of this medication? Patient states that she is currently out of this medication. She says that she's able to pick it up today, please advise.

## 2020-11-08 ENCOUNTER — Telehealth: Payer: Self-pay | Admitting: Pharmacist

## 2020-11-08 LAB — HEXAGONAL PHOSPHOLIPID NEUTRALIZATION: Hexagonal Phospholipid Neutral: 0 s

## 2020-11-08 LAB — FACTOR 5 LEIDEN

## 2020-11-08 LAB — PROTHROMBIN GENE MUTATION

## 2020-11-08 NOTE — Telephone Encounter (Signed)
Appeals for Eliquis has been submitted.

## 2020-11-13 ENCOUNTER — Other Ambulatory Visit: Payer: Self-pay | Admitting: Physician Assistant

## 2020-11-13 MED ORDER — CLOPIDOGREL BISULFATE 75 MG PO TABS: 75.0000 mg | ORAL_TABLET | Freq: Every day | ORAL | 3 refills | Status: AC

## 2020-11-14 ENCOUNTER — Ambulatory Visit (HOSPITAL_COMMUNITY): Payer: BC Managed Care – PPO

## 2020-11-16 ENCOUNTER — Other Ambulatory Visit: Payer: Self-pay

## 2020-11-16 ENCOUNTER — Inpatient Hospital Stay (HOSPITAL_COMMUNITY): Payer: BC Managed Care – PPO

## 2020-11-16 ENCOUNTER — Encounter (HOSPITAL_COMMUNITY): Payer: Self-pay | Admitting: Hematology and Oncology

## 2020-11-16 ENCOUNTER — Inpatient Hospital Stay (HOSPITAL_COMMUNITY): Payer: BC Managed Care – PPO | Admitting: Hematology and Oncology

## 2020-11-16 VITALS — BP 123/77 | HR 98 | Temp 98.4°F | Resp 18 | Wt 174.8 lb

## 2020-11-16 DIAGNOSIS — I82401 Acute embolism and thrombosis of unspecified deep veins of right lower extremity: Secondary | ICD-10-CM | POA: Diagnosis not present

## 2020-11-16 DIAGNOSIS — Z86718 Personal history of other venous thrombosis and embolism: Secondary | ICD-10-CM | POA: Diagnosis not present

## 2020-11-16 DIAGNOSIS — I2692 Saddle embolus of pulmonary artery without acute cor pulmonale: Secondary | ICD-10-CM | POA: Diagnosis not present

## 2020-11-16 DIAGNOSIS — I63512 Cerebral infarction due to unspecified occlusion or stenosis of left middle cerebral artery: Secondary | ICD-10-CM | POA: Diagnosis not present

## 2020-11-16 NOTE — Progress Notes (Signed)
[facility] CONSULT NOTE  Patient Care Team: Shirlean Mylar, MD as PCP - General (Family Medicine)  CHIEF COMPLAINTS/PURPOSE OF CONSULTATION:  DVT, PE and acute embolic stroke, had patent PFO in setting of COVID  ASSESSMENT & PLAN:  No problem-specific Assessment & Plan notes found for this encounter.  No orders of the defined types were placed in this encounter.  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  She was also diagnosed with Covid at the time of this admission  She has recovered almost completely from her stroke and has no residual leg swelling or chest complaints.  She is wondering if she needs to take this Eliquis for a long time. Physical examination today quite unremarkable, no palpable lymphadenopathy, hepatosplenomegaly, lower extremity swelling. Hypercoagulable work up showed mildly low protein S activity. We will repeat this today If protein S activity greater than 50%, then she can remain off of anticoagulation and RTC in 3 months for FU.  She can continue aspirin and plavix per cardiology recommendations.  I would not recommend pregnancy while on novel anticoagulants.  She should not use any form of estrogen-based contraception in the future.  She should come back to see hematology if she was to be pregnant. Thank you for consulting [place] in the care of this patient.  Please do not hesitate to contact us with any additional questions or concerns.  She should not  HISTORY OF PRESENTING ILLNESS:  Gail Montgomery 25 y.o. adult is here because of acute DVT of distal vein of RLE and   This is a 25 year old  female patient with history of anxiety, recreational marijuana use, oral contraceptive use admitted in August 2021 with sudden onset right-sided weakness in the speaking.  She was fully vaccinated a month prior to admission but was Covid positive.  CT head at that time showed dense left MCA on CTA head and neck showed emergent LVO at the left ICA terminus continuing in the left MCA which he underwent cerebral angiogram with thrombectomy of left.  Postprocedure she had large subarachnoid hemorrhage with ventricular extension and she received TXA and cryoprecipitate to stop bleeding.  MRI brain showed early subacute infarct in the left basal ganglia hemorrhage along the interhemispheric fissure to the lateral ventricles with early communicating hydrocephalus.    Repeat Doppler showed acute DVT in right femoral, popliteal and gastrocnemius veins. CTA chest showed bilateral PE and she was started on heparin and transition to Eliquis on 09/03. E. coli UTI treated with course of ceftriaxone. 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 Plans are for PFO closure in the future  She then underwent rehabilitation and is now referred to hematology for additional recommendations. APLS work up in the past was negative She is here for an follow up. She stopped eliquis because this was not covered by her insurance anymore. She is doing well. No chest pain, SOB, leg swelling, doing remarkably well She is not using any estrogen supplementation.  No history of thromboembolic events prior to the episode in August 2021.  No known family history of hypercoagulable disorders although she remembers her mom's dad  might have died at the age of 94 from heart disease.  She recently had a miscarriage about 2-1/2 weeks ago.  No other pregnancies.  Rest of the pertinent 10 point ROS reviewed and negative.  REVIEW OF SYSTEMS:    Constitutional: Denies  fevers, chills or abnormal night sweats Eyes: Denies blurriness of vision, double vision or watery eyes Ears, nose, mouth, throat, and face: Denies mucositis or sore throat Respiratory: Denies cough, dyspnea or wheezes Cardiovascular: Denies palpitation, chest discomfort or lower extremity swelling Gastrointestinal:  Denies nausea, heartburn or change in bowel habits Skin: Denies abnormal skin rashes Lymphatics: Denies new lymphadenopathy or easy bruising Neurological:Denies numbness, tingling or new weaknesses Behavioral/Psych: Mood is stable, no new changes  All other systems were reviewed with the patient and are negative.  MEDICAL HISTORY:  Past Medical History:  Diagnosis Date  . Anxiety   . Panic attack   . Stroke [facility]) 05/19/2020    SURGICAL HISTORY: Past Surgical History:  Procedure Laterality Date  . BUBBLE STUDY  06/06/2020   Procedure: BUBBLE STUDY;  Surgeon: Chrystie Nose, MD;  Location: [facility] ENDOSCOPY;  Service: Cardiovascular;;  . IR CT HEAD LTD  05/19/2020  . IR CT HEAD LTD  05/19/2020  . IR PERCUTANEOUS ART THROMBECTOMY/INFUSION INTRACRANIAL INC DIAG ANGIO  05/19/2020  . IR US GUIDE VASC ACCESS RIGHT  05/19/2020  . PATENT FORAMEN OVALE(PFO) CLOSURE N/A 09/07/2020   Procedure: PATENT FORAMEN OVALE (PFO) CLOSURE;  Surgeon: Tonny Bollman, MD;  Location: [facility] INVASIVE CV LAB;  Service: Cardiovascular;  Laterality: N/A;  . RADIOLOGY WITH ANESTHESIA N/A 05/19/2020   Procedure: IR WITH ANESTHESIA;  Surgeon: Radiologist, Medication, MD;  Location: [facility] OR;  Service: Radiology;  Laterality: N/A;  . TEE WITHOUT CARDIOVERSION N/A 06/06/2020   Procedure: TRANSESOPHAGEAL ECHOCARDIOGRAM (TEE);  Surgeon: Chrystie Nose, MD;  Location: [facility] ENDOSCOPY;  Service: Cardiovascular;  Laterality: N/A;    SOCIAL HISTORY: Social History   Socioeconomic History  . Marital status: Single    Spouse name: Not on file  . Number of children: Not on file  . Years of education: Not on file  .  Highest education level: Some college, no degree  Occupational History  . Occupation: part time working, full Neurosurgeon  Tobacco Use  . Smoking status: Former Smoker    Types: Cigarettes    Quit date: 05/19/2020    Years since quitting: 0.4  . Smokeless tobacco: Never Used  Vaping Use  . Vaping Use: Never used  Substance and Sexual Activity  . Alcohol use: Yes    Comment: OCCASIONAL   . Drug use: Yes    Types: Marijuana  . Sexual activity: Not on file  Other Topics Concern  . Not on file  Social History Narrative   Lives at home with parents   Right Handed   Drinks 1-2 cups caffeine daily   Social Determinants of Health   Financial Resource Strain: Not on file  Food Insecurity: Not on file  Transportation Needs: No Transportation Needs  . Lack of Transportation (Medical): No  . Lack of Transportation (Non-Medical): No  Physical Activity: Inactive  . Days of Exercise per Week: 0 days  . Minutes of Exercise per Session: 0 min  Stress: Not on file  Social Connections: Not on file  Intimate Partner Violence: Not At Risk  . Fear of Current or Ex-Partner: No  . Emotionally Abused: No  . Physically Abused: No  . Sexually Abused: No    FAMILY HISTORY: Family  History  Problem Relation Age of Onset  . Healthy Mother   . Healthy Father   . Cancer Maternal Grandmother        renal  . Heart disease Maternal Grandfather     ALLERGIES:  is allergic to other.  MEDICATIONS:  Current Outpatient Medications  Medication Sig Dispense Refill  . acetaminophen (TYLENOL) 500 MG tablet Take 500 mg by mouth every 6 (six) hours as needed for headache.    Marland Kitchen aspirin EC 81 MG tablet Take 1 tablet (81 mg total) by mouth daily. Swallow whole. 90 tablet 3  . clopidogrel (PLAVIX) 75 MG tablet Take 1 tablet (75 mg total) by mouth daily. 30 tablet 3  . Multiple Vitamin (MULTIVITAMIN) tablet Take 1 tablet by mouth daily. 30 tablet 5  . PHEXXI 1.8-1-0.4 % GEL Place vaginally daily. (Patient  not taking: Reported on 11/16/2020)     No current facility-administered medications for this visit.     PHYSICAL EXAMINATION: ECOG PERFORMANCE STATUS: 0 - Asymptomatic  Vitals:   11/16/20 1007  BP: 123/77  Pulse: 98  Resp: 18  Temp: 98.4 F (36.9 C)  SpO2: 100%   Filed Weights   11/16/20 1007  Weight: 174 lb 12.8 oz (79.3 kg)    PE deferred in lieu of counseling.  LABORATORY DATA:  I have reviewed the data as listed Lab Results  Component Value Date   WBC 3.2 (L) 08/29/2020   HGB 11.6 08/29/2020   HCT 35.9 08/29/2020   MCV 87 08/29/2020   PLT 283 08/29/2020     Chemistry      Component Value Date/Time   NA 144 08/29/2020 1429   K 4.5 08/29/2020 1429   CL 108 (H) 08/29/2020 1429   CO2 22 08/29/2020 1429   BUN 7 08/29/2020 1429   CREATININE 0.63 08/29/2020 1429      Component Value Date/Time   CALCIUM 9.2 08/29/2020 1429   ALKPHOS 62 06/08/2020 1444   AST 39 06/08/2020 1444   ALT 43 06/08/2020 1444   BILITOT 0.4 06/08/2020 1444     Reviewed all of the hypercoagulable work up Mildly low protein S activity at 57% dRVVT elevated from eliquis, confirm negative.  IMPRESSION: 1. Bilateral segmental LOWER lobe pulmonary emboli, LEFT-greater-than-RIGHT. Equivocal segmental emboli within the LEFT UPPER lobe. 2. Mild cardiomegaly.  I have reviewed all pertinent medical records from the August admission.  RADIOGRAPHIC STUDIES: I have personally reviewed the radiological images as listed and agreed with the findings in the report. No results found.  All questions were answered. The patient knows to call the clinic with any problems, questions or concerns.  I spent 30 minutes in the care of this patient including History, review of labs, counseling and coordination of care. She was instructed to monitor for symptoms of DVT and PE and return to ED with any concerns.    Rachel Moulds, MD 11/16/2020 10:17 AM

## 2020-11-17 LAB — PROTEIN S ACTIVITY: Protein S Activity: 53 % — ABNORMAL LOW (ref 63–140)

## 2020-11-20 ENCOUNTER — Encounter (HOSPITAL_COMMUNITY): Payer: Self-pay | Admitting: Hematology and Oncology

## 2020-11-20 NOTE — Telephone Encounter (Signed)
Changed visit to MyChart per pt request.

## 2020-11-21 ENCOUNTER — Other Ambulatory Visit: Payer: Self-pay | Admitting: Hematology and Oncology

## 2020-11-21 ENCOUNTER — Other Ambulatory Visit (HOSPITAL_COMMUNITY): Payer: Self-pay | Admitting: *Deleted

## 2020-11-21 DIAGNOSIS — I82401 Acute embolism and thrombosis of unspecified deep veins of right lower extremity: Secondary | ICD-10-CM

## 2020-11-21 NOTE — Progress Notes (Signed)
Called patient She is agreeable to come for protein S free and total antigen blood draw. If free antigen less than 60-65 IU/dl, then we will consider her deficient.  Gail Montgomery

## 2020-11-21 NOTE — Progress Notes (Signed)
Free and total protein S levels ordered.  Gail Montgomery

## 2020-11-22 ENCOUNTER — Inpatient Hospital Stay (HOSPITAL_COMMUNITY): Payer: BC Managed Care – PPO

## 2020-12-13 ENCOUNTER — Other Ambulatory Visit (HOSPITAL_COMMUNITY): Payer: Self-pay

## 2020-12-13 DIAGNOSIS — I82401 Acute embolism and thrombosis of unspecified deep veins of right lower extremity: Secondary | ICD-10-CM

## 2020-12-19 ENCOUNTER — Encounter (HOSPITAL_COMMUNITY): Payer: Self-pay | Admitting: Hematology and Oncology

## 2021-02-14 ENCOUNTER — Encounter (HOSPITAL_COMMUNITY): Payer: Self-pay | Admitting: Hematology and Oncology

## 2021-02-27 ENCOUNTER — Other Ambulatory Visit (HOSPITAL_COMMUNITY): Payer: Self-pay

## 2021-02-27 DIAGNOSIS — I82401 Acute embolism and thrombosis of unspecified deep veins of right lower extremity: Secondary | ICD-10-CM

## 2021-02-28 ENCOUNTER — Inpatient Hospital Stay (HOSPITAL_COMMUNITY): Payer: BC Managed Care – PPO

## 2021-02-28 ENCOUNTER — Other Ambulatory Visit (HOSPITAL_COMMUNITY): Payer: BC Managed Care – PPO

## 2021-03-04 ENCOUNTER — Inpatient Hospital Stay (HOSPITAL_COMMUNITY): Payer: BC Managed Care – PPO | Attending: Hematology

## 2021-03-07 ENCOUNTER — Telehealth (HOSPITAL_COMMUNITY): Payer: BC Managed Care – PPO | Admitting: Physician Assistant

## 2021-03-08 ENCOUNTER — Telehealth (HOSPITAL_COMMUNITY): Payer: BC Managed Care – PPO | Admitting: Physician Assistant

## 2021-03-25 ENCOUNTER — Inpatient Hospital Stay (HOSPITAL_COMMUNITY): Payer: BC Managed Care – PPO | Attending: Hematology

## 2021-03-25 ENCOUNTER — Other Ambulatory Visit: Payer: Self-pay

## 2021-03-25 DIAGNOSIS — Z86718 Personal history of other venous thrombosis and embolism: Secondary | ICD-10-CM | POA: Insufficient documentation

## 2021-03-25 DIAGNOSIS — I82401 Acute embolism and thrombosis of unspecified deep veins of right lower extremity: Secondary | ICD-10-CM

## 2021-03-25 DIAGNOSIS — Z7901 Long term (current) use of anticoagulants: Secondary | ICD-10-CM | POA: Diagnosis not present

## 2021-03-25 DIAGNOSIS — Z86711 Personal history of pulmonary embolism: Secondary | ICD-10-CM | POA: Insufficient documentation

## 2021-03-25 DIAGNOSIS — Z8673 Personal history of transient ischemic attack (TIA), and cerebral infarction without residual deficits: Secondary | ICD-10-CM | POA: Diagnosis not present

## 2021-03-25 LAB — COMPREHENSIVE METABOLIC PANEL
ALT: 11 U/L (ref 0–44)
AST: 14 U/L — ABNORMAL LOW (ref 15–41)
Albumin: 3.6 g/dL (ref 3.5–5.0)
Alkaline Phosphatase: 70 U/L (ref 38–126)
Anion gap: 4 — ABNORMAL LOW (ref 5–15)
BUN: 10 mg/dL (ref 6–20)
CO2: 27 mmol/L (ref 22–32)
Calcium: 8.9 mg/dL (ref 8.9–10.3)
Chloride: 108 mmol/L (ref 98–111)
Creatinine, Ser: 0.63 mg/dL (ref 0.44–1.00)
GFR, Estimated: >60 mL/min (ref >60)
Glucose, Bld: 85 mg/dL (ref 70–99)
Potassium: 3.9 mmol/L (ref 3.5–5.1)
Sodium: 139 mmol/L (ref 135–145)
Total Bilirubin: 0.4 mg/dL (ref 0.3–1.2)
Total Protein: 7.6 g/dL (ref 6.5–8.1)

## 2021-03-25 LAB — CBC WITH DIFFERENTIAL/PLATELET
Abs Immature Granulocytes: 0.01 10*3/uL (ref 0.00–0.07)
Basophils Absolute: 0 10*3/uL (ref 0.0–0.1)
Basophils Relative: 0 %
Eosinophils Absolute: 0.1 10*3/uL (ref 0.0–0.5)
Eosinophils Relative: 2 %
HCT: 38.3 % (ref 36.0–46.0)
Hemoglobin: 12.1 g/dL (ref 12.0–15.0)
Immature Granulocytes: 0 %
Lymphocytes Relative: 48 %
Lymphs Abs: 1.6 10*3/uL (ref 0.7–4.0)
MCH: 29 pg (ref 26.0–34.0)
MCHC: 31.6 g/dL (ref 30.0–36.0)
MCV: 91.8 fL (ref 80.0–100.0)
Monocytes Absolute: 0.5 10*3/uL (ref 0.1–1.0)
Monocytes Relative: 15 %
Neutro Abs: 1.2 10*3/uL — ABNORMAL LOW (ref 1.7–7.7)
Neutrophils Relative %: 35 %
Platelets: 273 10*3/uL (ref 150–400)
RBC: 4.17 MIL/uL (ref 3.87–5.11)
RDW: 13.2 % (ref 11.5–15.5)
WBC: 3.4 10*3/uL — ABNORMAL LOW (ref 4.0–10.5)
nRBC: 0 % (ref 0.0–0.2)

## 2021-03-26 LAB — PROTEIN S, TOTAL AND FREE
Protein S Ag, Free: 60 % — ABNORMAL LOW (ref 61–136)
Protein S Ag, Total: 76 % (ref 60–150)

## 2021-03-27 NOTE — Progress Notes (Signed)
Virtual Visit via Telephone Note [facility]  I connected with Gail Montgomery on 03/28/21  at  9:12 AM by telephone and verified that I am speaking with the correct person using two identifiers.  Location: Patient: Home Provider: [facility]   I discussed the limitations, risks, security and privacy concerns of performing an evaluation and management service by telephone and the availability of in person appointments. I also discussed with the patient that there may be a patient responsible charge related to this service. The patient expressed understanding and agreed to proceed.   HISTORY OF PRESENT ILLNESS: This is a very pleasant 25 year old female patient with history of acute embolic stroke of left middle cerebral artery as well as DVT of right lower extremity (right femoral, popliteal, and gastrocnemius vein), bilateral PE, and PFO.   This occurred in August 2021, and she was also found to be COVID-19 positive at that time, although she was otherwise asymptomatic.  Thromboembolic events were suspected to be provoked in the setting of COVID-19, oral contraceptive use, smoking, and a 4-hour flight 10-days prior to this episode.  She had Pfizer COVID vaccine about 3 weeks prior to the episode. PFO was closed on 09/07/2020.  She was evaluated by hematology for coagulopathy work-up and recommendations regarding long-term anticoagulation.  She was treated with Eliquis from August 2021 through March 2022, but stopped due to issues with insurance.  Her coagulopathy work-up was unremarkable apart from mildly decreased protein S activity.   INTERVAL HISTORY: Ms. Gail Montgomery is contacted today for follow-up of her history of DVT, PE, and CVA.  She was last seen by Dr. Al Pimple on 11/16/2020. Patient reports that she is doing very well.  She is taking college classes at [employer].  She has recovered completely from her stroke and does not have any residual deficits.  No current signs or  symptoms of DVT or PE - she denies any unilateral leg swelling, erythema, pain; no chest pain, dyspnea on exertion, unexplained cough, or hemoptysis.  She denies any TIA or strokelike symptoms. She remains on Plavix, and is wondering if she can discontinue it.  She has not had any bleeding episodes. She is not taking any oral birth control at this time.  She did unfortunately have a miscarriage at [redacted] weeks gestation on 10/13/2020 while on Eliquis.   OBSERVATION/OBJECTIVE: Review of Systems  Constitutional:  Negative for chills, diaphoresis, fever, malaise/fatigue and weight loss.  Respiratory:  Negative for cough and shortness of breath.   Cardiovascular:  Negative for chest pain and palpitations.  Gastrointestinal:  Negative for abdominal pain, blood in stool, melena, nausea and vomiting.  Neurological:  Negative for dizziness and headaches.    PHYSICAL EXAM (per limitations of virtual telephone visit): The patient is alert and oriented x 3, exhibiting adequate mentation, good mood, and ability to speak in full sentences and execute sound judgement.   ASSESSMENT & PLAN: 1.  History of provoked DVT, PE, and acute embolic stroke in the setting of PFO -August 2021: Hospital admission for acute embolic stroke of left middle cerebral artery as well as DVT of right lower extremity (right femoral, popliteal, and gastrocnemius vein), bilateral PE, and PFO. - Thromboembolic events were suspected to be provoked in the setting of COVID-19, oral contraceptive use, smoking, and 4-hour flight prior to this episode. - PFO was closed on 09/07/2020. - She was treated with Eliquis from August 2021 through March 2022 - She was evaluated by hematology for coagulopathy work-up  and recommendations regarding long-term anticoagulation.  Her coagulopathy work-up was unremarkable apart from mildly decreased protein S activity. - Currently asymptomatic at the time of today's visit  - Labs reviewed (03/25/2021): Protein  S Ag free borderline low at 60%, CBC essentially unremarkable, CMP unremarkable - PLAN: No indication for continued Eliquis at this time.we will defer decisions regarding her Plavix to her cardiologist and neurologist.  If Plavix is discontinued by them, we recommend that she start taking 81 mg daily aspirin.  If patient develops any blood clots in the future, she should be on lifelong anticoagulation. We would not recommend pregnancy while on novel anticoagulants or aspirin (which should not be used within the first 12 weeks of pregnancy).  She should not use any form of estrogen-based contraception in the future.  She should come back to see hematology if she was to be pregnant in the future.   Follow Up Instructions: -RTC in 1 year for follow-up with labs the week before    I discussed the assessment and treatment plan with the patient. The patient was provided an opportunity to ask questions and all were answered. The patient agreed with the plan and demonstrated an understanding of the instructions.   The patient was advised to call back or seek an in-person evaluation if the symptoms worsen or if the condition fails to improve as anticipated.  The above clinical data and plan of treatment was discussed with Dr. Al Pimple, supervising physician, who agrees.   I provided 23 minutes of non-face-to-face time during this encounter.   Carnella Guadalajara, PA-C 9:40 AM

## 2021-03-28 ENCOUNTER — Telehealth (HOSPITAL_COMMUNITY): Payer: BC Managed Care – PPO | Admitting: Hematology and Oncology

## 2021-03-28 ENCOUNTER — Other Ambulatory Visit: Payer: Self-pay

## 2021-03-28 ENCOUNTER — Inpatient Hospital Stay (HOSPITAL_BASED_OUTPATIENT_CLINIC_OR_DEPARTMENT_OTHER): Payer: BC Managed Care – PPO | Admitting: Physician Assistant

## 2021-03-28 DIAGNOSIS — Z86711 Personal history of pulmonary embolism: Secondary | ICD-10-CM

## 2021-03-28 DIAGNOSIS — Z86718 Personal history of other venous thrombosis and embolism: Secondary | ICD-10-CM

## 2021-03-28 DIAGNOSIS — Z8673 Personal history of transient ischemic attack (TIA), and cerebral infarction without residual deficits: Secondary | ICD-10-CM

## 2021-03-28 DIAGNOSIS — I82401 Acute embolism and thrombosis of unspecified deep veins of right lower extremity: Secondary | ICD-10-CM | POA: Diagnosis not present

## 2021-03-29 NOTE — Progress Notes (Signed)
Yes, I will follow that.  Thank you.

## 2021-07-30 NOTE — Telephone Encounter (Signed)
Pts one year echo/bubble and appointment are 09/11/21.

## 2021-09-11 ENCOUNTER — Other Ambulatory Visit (HOSPITAL_COMMUNITY): Payer: BC Managed Care – PPO

## 2021-09-11 ENCOUNTER — Ambulatory Visit: Payer: BC Managed Care – PPO | Admitting: Physician Assistant

## 2022-03-17 ENCOUNTER — Other Ambulatory Visit (HOSPITAL_COMMUNITY): Payer: BC Managed Care – PPO

## 2022-03-24 ENCOUNTER — Ambulatory Visit (HOSPITAL_COMMUNITY): Payer: BC Managed Care – PPO | Admitting: Physician Assistant

## 2022-03-25 ENCOUNTER — Other Ambulatory Visit (HOSPITAL_COMMUNITY): Payer: Self-pay

## 2022-04-08 ENCOUNTER — Ambulatory Visit (HOSPITAL_COMMUNITY): Payer: Self-pay | Admitting: Physician Assistant

## 2022-07-23 IMAGING — DX DG CHEST 1V PORT
1 series · 1 of 1 positions shown · non-contrast
Comparison: 05/22/2020

CLINICAL DATA: Fever.  COVID positive.

EXAM:
PORTABLE CHEST 1 VIEW

[chest ap]
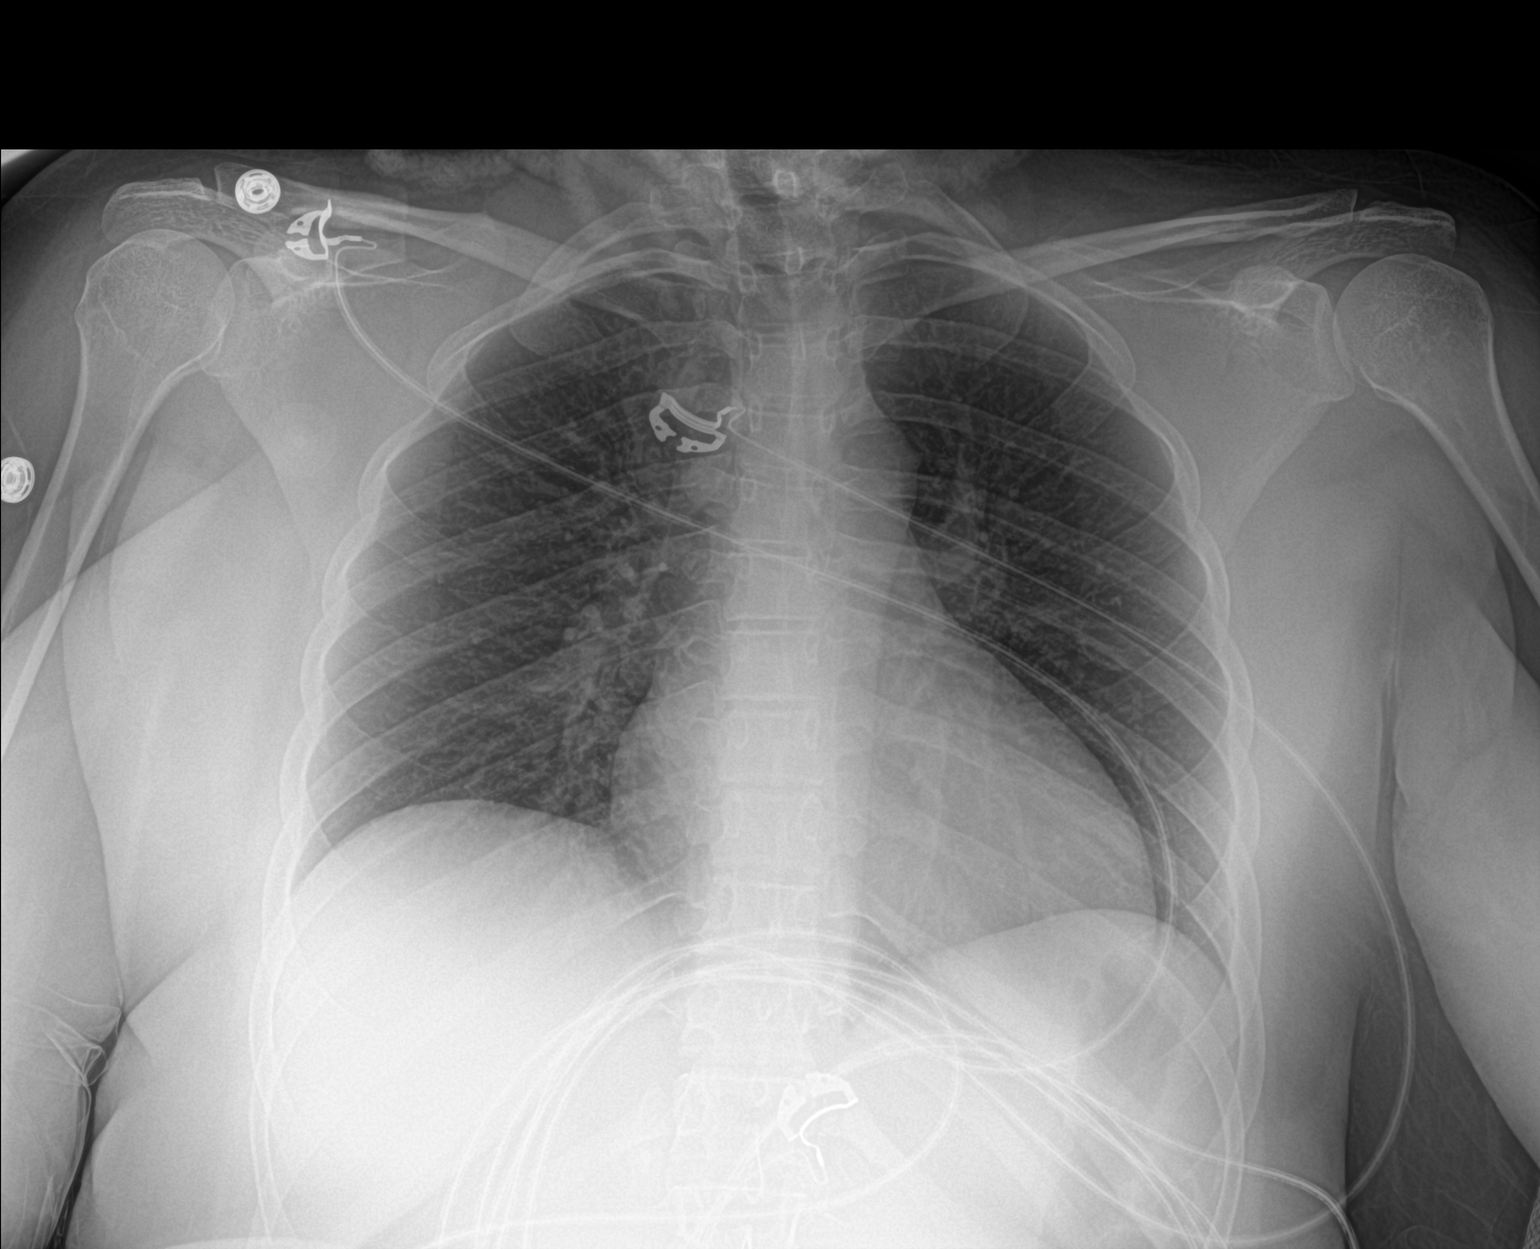

[1 of 1 positions shown; findings below may reference images not displayed]

FINDINGS: Midline trachea.  Normal heart size and mediastinal contours.

Sharp costophrenic angles. No pneumothorax. Clear lungs. Mild right
hemidiaphragm elevation.
IMPRESSION: No active disease.

## 2022-07-23 IMAGING — CT CT ANGIO CHEST
2 of 6 series · 18 of 46 positions shown · IV contrast (omnipaque)
Comparison: None.

CLINICAL DATA: 24-year-old female with tachycardia, COVID and
positive D-dimer.

EXAM:
CT ANGIOGRAPHY CHEST WITH CONTRAST
TECHNIQUE: Multidetector CT imaging of the chest was performed using the
standard protocol during bolus administration of intravenous
contrast. Multiplanar CT image reconstructions and MIPs were
obtained to evaluate the vascular anatomy.
CONTRAST:  60mL OMNIPAQUE IOHEXOL 350 MG/ML SOLN

[Series 6: thins · axial · 0.59mm/px · z∈[+1139,+1349]mm · 15 of 231 slices shown]
[im 11/231  lung]
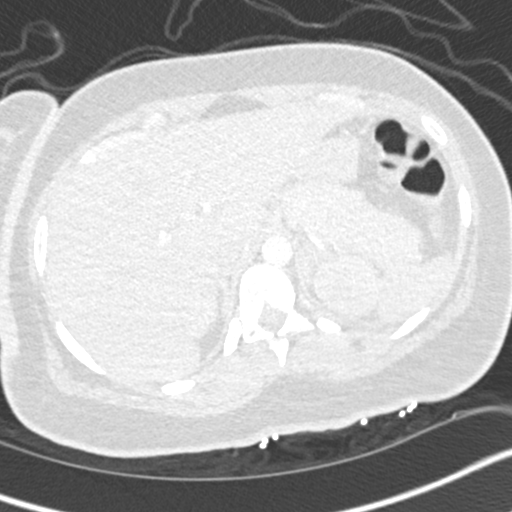
[im 31/231  soft-tissue]
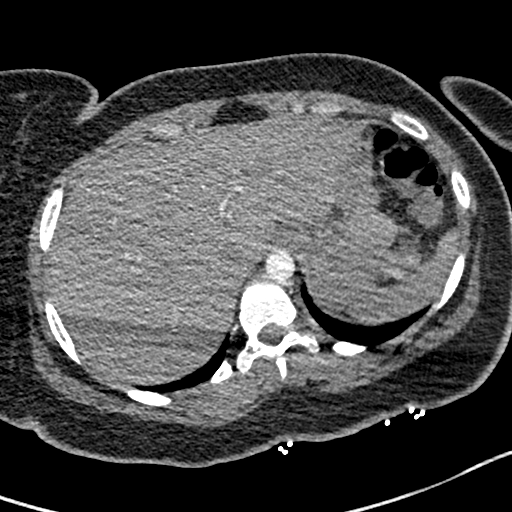
[im 41/231  lung]
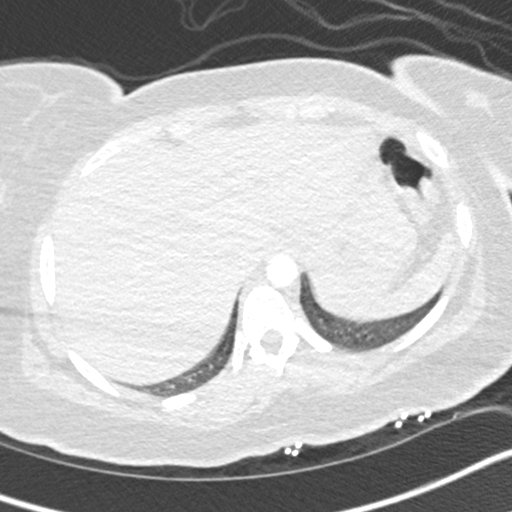
[im 61/231  soft-tissue]
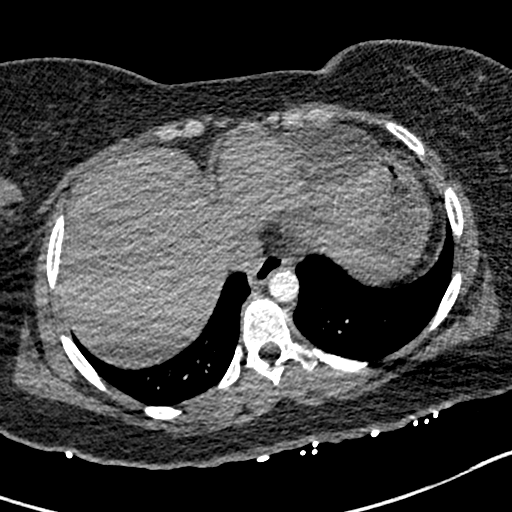
[im 71/231  lung]
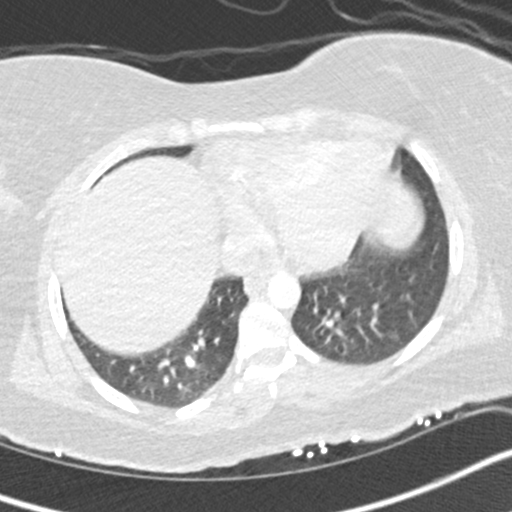
[im 91/231  soft-tissue]
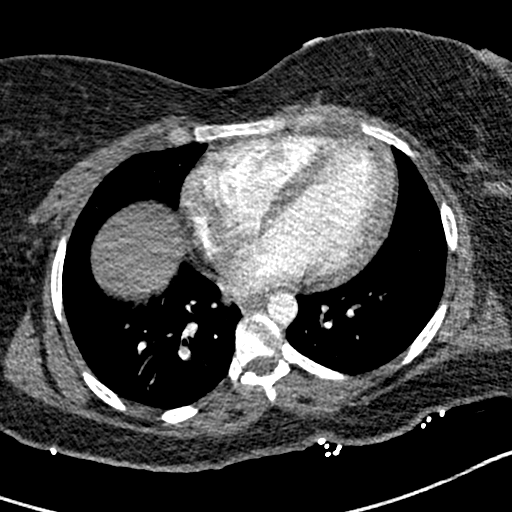
[im 101/231  lung]
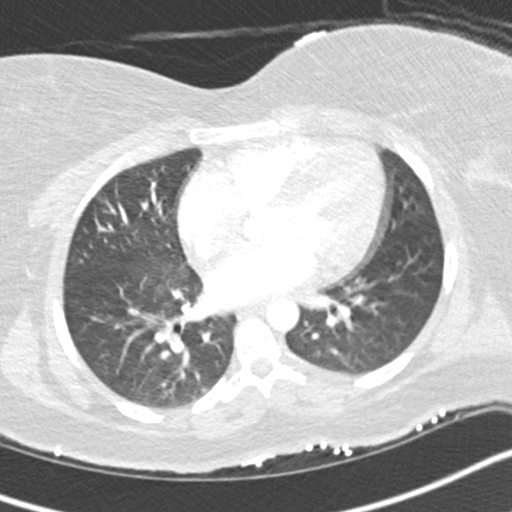
[im 121/231  soft-tissue]
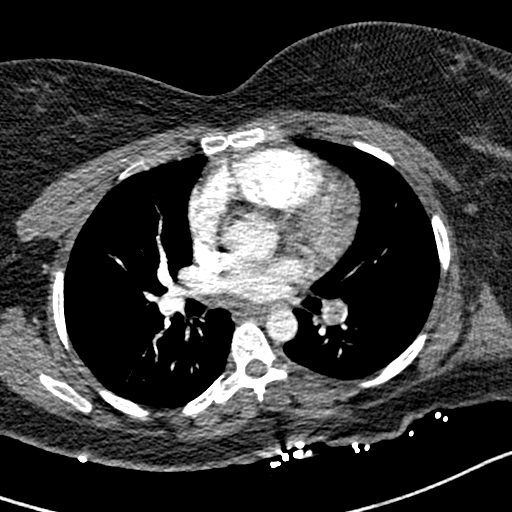
[im 131/231  lung]
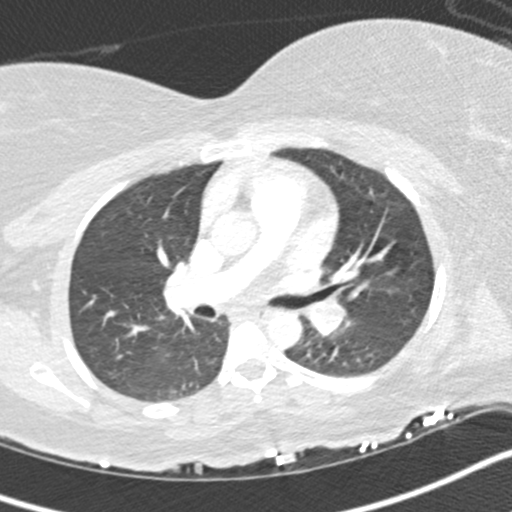
[im 141/231  soft-tissue]
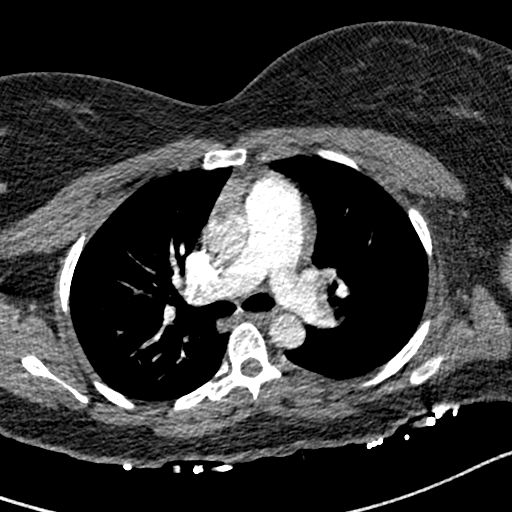
[im 161/231  lung]
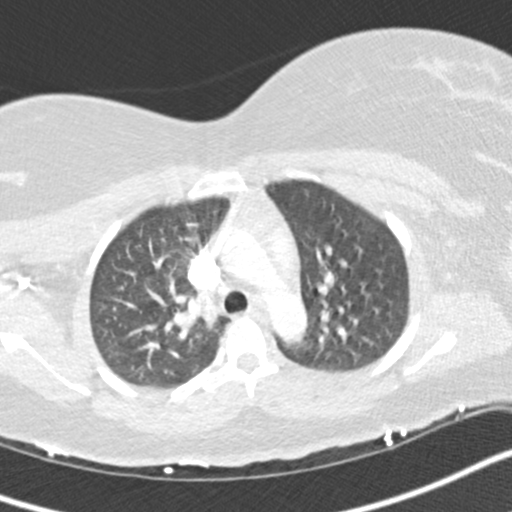
[im 171/231  soft-tissue]
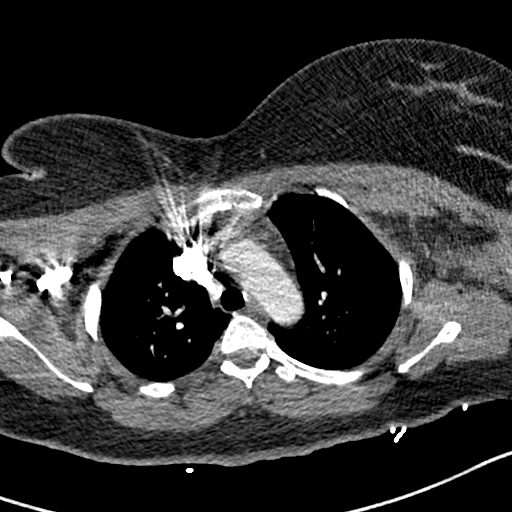
[im 191/231  lung]
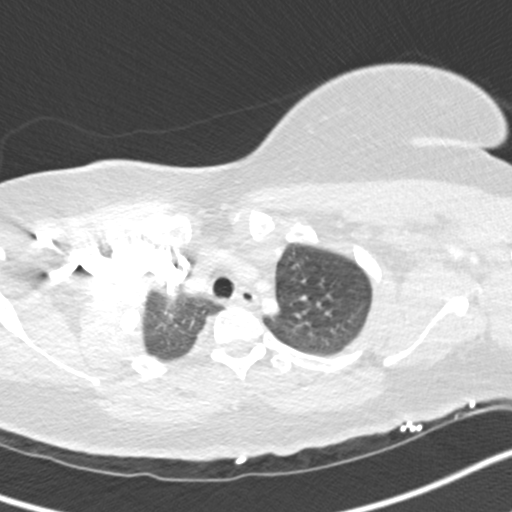
[im 201/231  soft-tissue]
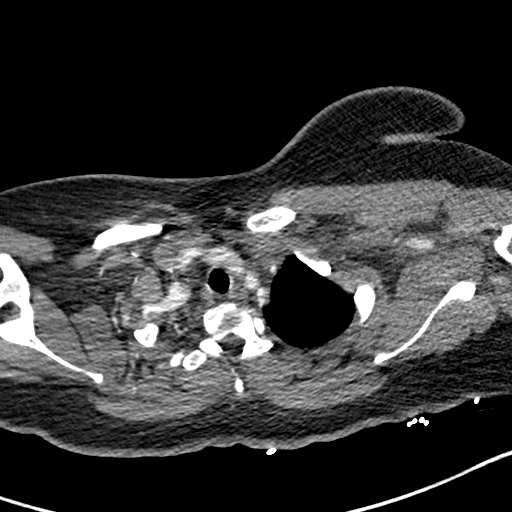
[im 221/231  lung]
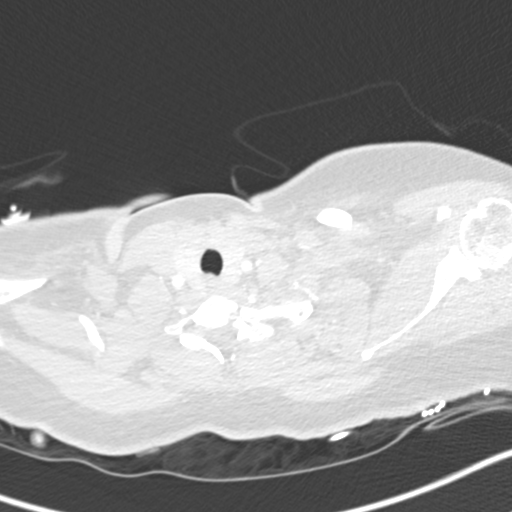

[Series 8: coronal mpr · coronal · 0.52mm/px · 3 of 102 slices shown]
[im 26/102  soft-tissue]
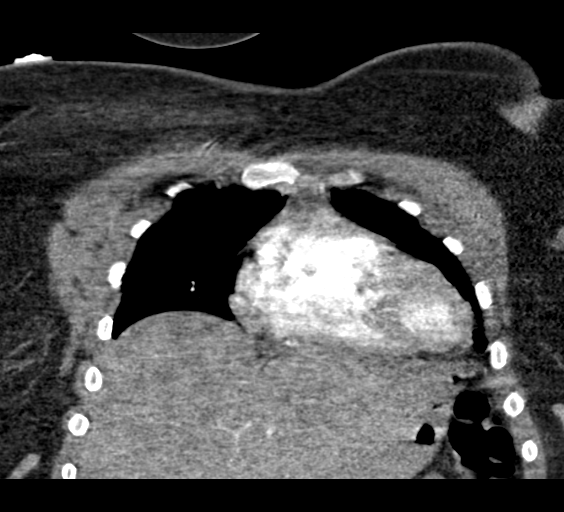
[im 51/102  soft-tissue]
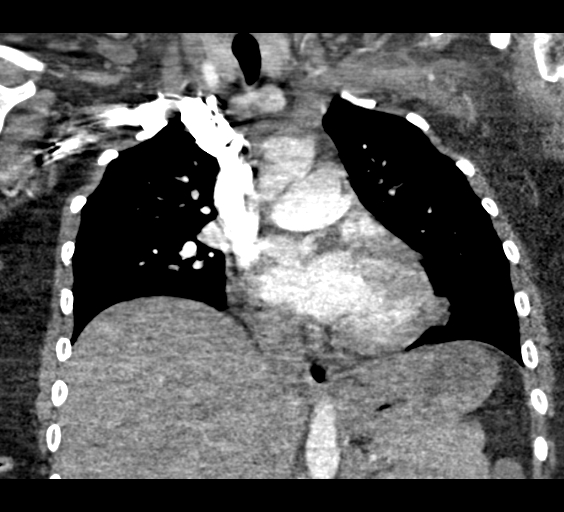
[im 76/102  soft-tissue]
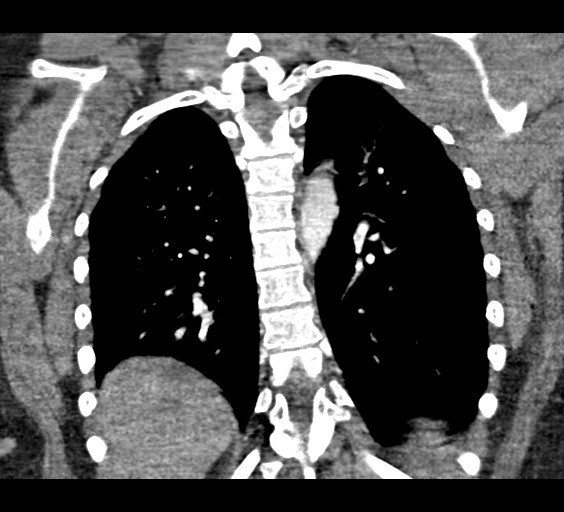

[18 of 46 positions shown; findings below may reference images not displayed]

FINDINGS: Cardiovascular: This is a technically adequate study, but motion
artifact does limit evaluation in some areas.

Multiple pulmonary emboli are identified and within the LEFT LOWER
lobe pulmonary artery, and multiple bilateral segmental LOWER lobe
pulmonary arteries. Equivocal emboli within LEFT UPPER lobe
segmental pulmonary arteries noted.

Mild cardiomegaly is present.  RV/LV ratio equals 0.84.

There is no evidence of thoracic aortic aneurysm or pericardial
effusion.

Mediastinum/Nodes: No enlarged mediastinal, hilar, or axillary lymph
nodes. Thyroid gland, trachea, and esophagus demonstrate no
significant findings.

Lungs/Pleura: No airspace disease, consolidation, mass or nodule.
No pleural effusion or pneumothorax.

Upper Abdomen: No acute abnormality.

Musculoskeletal: No chest wall abnormality. No acute or significant
osseous findings.

Review of the MIP images confirms the above findings.
IMPRESSION: 1. Bilateral segmental LOWER lobe pulmonary emboli,
LEFT-greater-than-RIGHT. Equivocal segmental emboli within the LEFT
UPPER lobe.
2. Mild cardiomegaly.

Critical Value/emergent results were called by telephone at the time
of interpretation on 05/28/2020 at [DATE] to provider Taizan, nurse
on this floor,, who verbally acknowledged these results.
